# Patient Record
Sex: Male | Born: 1935 | Race: Black or African American | Hispanic: No | Marital: Married | Smoking: Former smoker
Health system: Southern US, Community
[De-identification: ages and names within clinical notes are randomized; demographics above are authoritative.]

## PROBLEM LIST (undated history)

## (undated) DIAGNOSIS — N200 Calculus of kidney: Secondary | ICD-10-CM

## (undated) DIAGNOSIS — C61 Malignant neoplasm of prostate: Secondary | ICD-10-CM

## (undated) DIAGNOSIS — E785 Hyperlipidemia, unspecified: Secondary | ICD-10-CM

## (undated) DIAGNOSIS — I10 Essential (primary) hypertension: Secondary | ICD-10-CM

## (undated) DIAGNOSIS — C679 Malignant neoplasm of bladder, unspecified: Secondary | ICD-10-CM

## (undated) DIAGNOSIS — I2510 Atherosclerotic heart disease of native coronary artery without angina pectoris: Secondary | ICD-10-CM

## (undated) HISTORY — PX: PROSTATE SURGERY: SHX751

## (undated) HISTORY — PX: CORONARY ARTERY BYPASS GRAFT: SHX141

## (undated) HISTORY — PX: PENILE PROSTHESIS IMPLANT: SHX240

## (undated) HISTORY — PX: CARDIAC SURGERY: SHX584

---

## 1998-07-12 ENCOUNTER — Emergency Department (HOSPITAL_COMMUNITY): Admission: EM | Admit: 1998-07-12 | Discharge: 1998-07-12 | Payer: Self-pay | Admitting: *Deleted

## 1998-10-31 ENCOUNTER — Emergency Department (HOSPITAL_COMMUNITY): Admission: EM | Admit: 1998-10-31 | Discharge: 1998-10-31 | Payer: Self-pay | Admitting: Emergency Medicine

## 1998-10-31 ENCOUNTER — Encounter: Payer: Self-pay | Admitting: Emergency Medicine

## 1998-11-21 ENCOUNTER — Emergency Department (HOSPITAL_COMMUNITY): Admission: EM | Admit: 1998-11-21 | Discharge: 1998-11-21 | Payer: Self-pay | Admitting: Emergency Medicine

## 1998-12-19 ENCOUNTER — Inpatient Hospital Stay (HOSPITAL_COMMUNITY): Admission: EM | Admit: 1998-12-19 | Discharge: 1998-12-25 | Payer: Self-pay | Admitting: Emergency Medicine

## 1998-12-19 ENCOUNTER — Encounter: Payer: Self-pay | Admitting: Cardiology

## 1999-01-17 ENCOUNTER — Encounter (HOSPITAL_COMMUNITY): Admission: RE | Admit: 1999-01-17 | Discharge: 1999-04-17 | Payer: Self-pay | Admitting: Cardiology

## 1999-03-22 ENCOUNTER — Encounter: Payer: Self-pay | Admitting: Emergency Medicine

## 1999-03-22 ENCOUNTER — Inpatient Hospital Stay (HOSPITAL_COMMUNITY): Admission: EM | Admit: 1999-03-22 | Discharge: 1999-03-30 | Payer: Self-pay | Admitting: Emergency Medicine

## 1999-03-23 ENCOUNTER — Encounter: Payer: Self-pay | Admitting: Cardiology

## 1999-03-24 ENCOUNTER — Encounter: Payer: Self-pay | Admitting: Thoracic Surgery (Cardiothoracic Vascular Surgery)

## 1999-03-24 ENCOUNTER — Encounter: Payer: Self-pay | Admitting: Cardiology

## 1999-03-25 ENCOUNTER — Encounter: Payer: Self-pay | Admitting: Cardiology

## 1999-03-26 ENCOUNTER — Encounter: Payer: Self-pay | Admitting: Thoracic Surgery (Cardiothoracic Vascular Surgery)

## 1999-03-27 ENCOUNTER — Encounter: Payer: Self-pay | Admitting: Thoracic Surgery (Cardiothoracic Vascular Surgery)

## 1999-06-03 ENCOUNTER — Inpatient Hospital Stay (HOSPITAL_COMMUNITY): Admission: EM | Admit: 1999-06-03 | Discharge: 1999-06-06 | Payer: Self-pay | Admitting: Emergency Medicine

## 1999-06-03 ENCOUNTER — Encounter: Payer: Self-pay | Admitting: Emergency Medicine

## 1999-06-04 ENCOUNTER — Encounter: Payer: Self-pay | Admitting: Cardiology

## 1999-06-05 ENCOUNTER — Encounter: Payer: Self-pay | Admitting: Cardiovascular Disease

## 1999-06-06 ENCOUNTER — Encounter: Payer: Self-pay | Admitting: Cardiology

## 1999-07-20 ENCOUNTER — Inpatient Hospital Stay (HOSPITAL_COMMUNITY): Admission: EM | Admit: 1999-07-20 | Discharge: 1999-07-22 | Payer: Self-pay | Admitting: Emergency Medicine

## 1999-07-20 ENCOUNTER — Encounter: Payer: Self-pay | Admitting: Emergency Medicine

## 1999-08-09 ENCOUNTER — Emergency Department (HOSPITAL_COMMUNITY): Admission: EM | Admit: 1999-08-09 | Discharge: 1999-08-09 | Payer: Self-pay | Admitting: Emergency Medicine

## 1999-11-30 ENCOUNTER — Encounter: Payer: Self-pay | Admitting: Emergency Medicine

## 1999-11-30 ENCOUNTER — Inpatient Hospital Stay (HOSPITAL_COMMUNITY): Admission: EM | Admit: 1999-11-30 | Discharge: 1999-12-01 | Payer: Self-pay | Admitting: Emergency Medicine

## 2000-07-28 ENCOUNTER — Emergency Department (HOSPITAL_COMMUNITY): Admission: EM | Admit: 2000-07-28 | Discharge: 2000-07-29 | Payer: Self-pay | Admitting: Emergency Medicine

## 2000-07-28 ENCOUNTER — Encounter: Payer: Self-pay | Admitting: Emergency Medicine

## 2000-10-19 ENCOUNTER — Inpatient Hospital Stay (HOSPITAL_COMMUNITY): Admission: EM | Admit: 2000-10-19 | Discharge: 2000-10-20 | Payer: Self-pay | Admitting: Emergency Medicine

## 2000-10-19 ENCOUNTER — Encounter: Payer: Self-pay | Admitting: Emergency Medicine

## 2000-10-20 ENCOUNTER — Encounter: Payer: Self-pay | Admitting: Cardiology

## 2001-01-09 ENCOUNTER — Ambulatory Visit (HOSPITAL_COMMUNITY): Admission: RE | Admit: 2001-01-09 | Discharge: 2001-01-09 | Payer: Self-pay | Admitting: Cardiology

## 2001-01-22 ENCOUNTER — Ambulatory Visit (HOSPITAL_COMMUNITY): Admission: RE | Admit: 2001-01-22 | Discharge: 2001-01-22 | Payer: Self-pay | Admitting: Cardiology

## 2001-01-22 ENCOUNTER — Encounter: Payer: Self-pay | Admitting: Cardiology

## 2001-04-25 ENCOUNTER — Emergency Department (HOSPITAL_COMMUNITY): Admission: EM | Admit: 2001-04-25 | Discharge: 2001-04-26 | Payer: Self-pay | Admitting: Emergency Medicine

## 2001-04-28 ENCOUNTER — Encounter: Admission: RE | Admit: 2001-04-28 | Discharge: 2001-04-28 | Payer: Self-pay | Admitting: Urology

## 2001-04-28 ENCOUNTER — Encounter: Payer: Self-pay | Admitting: Urology

## 2001-04-29 ENCOUNTER — Ambulatory Visit (HOSPITAL_BASED_OUTPATIENT_CLINIC_OR_DEPARTMENT_OTHER): Admission: RE | Admit: 2001-04-29 | Discharge: 2001-04-29 | Payer: Self-pay | Admitting: Urology

## 2001-04-29 ENCOUNTER — Encounter: Payer: Self-pay | Admitting: Urology

## 2001-05-01 ENCOUNTER — Ambulatory Visit (HOSPITAL_COMMUNITY): Admission: RE | Admit: 2001-05-01 | Discharge: 2001-05-01 | Payer: Self-pay | Admitting: Urology

## 2001-05-01 ENCOUNTER — Encounter: Payer: Self-pay | Admitting: Urology

## 2001-05-17 ENCOUNTER — Encounter: Payer: Self-pay | Admitting: Emergency Medicine

## 2001-05-17 ENCOUNTER — Emergency Department (HOSPITAL_COMMUNITY): Admission: EM | Admit: 2001-05-17 | Discharge: 2001-05-18 | Payer: Self-pay | Admitting: Emergency Medicine

## 2001-05-22 ENCOUNTER — Encounter: Payer: Self-pay | Admitting: Urology

## 2001-05-22 ENCOUNTER — Ambulatory Visit (HOSPITAL_COMMUNITY): Admission: RE | Admit: 2001-05-22 | Discharge: 2001-05-22 | Payer: Self-pay | Admitting: Urology

## 2001-07-21 ENCOUNTER — Inpatient Hospital Stay (HOSPITAL_COMMUNITY): Admission: EM | Admit: 2001-07-21 | Discharge: 2001-07-23 | Payer: Self-pay | Admitting: Emergency Medicine

## 2001-07-21 ENCOUNTER — Encounter: Payer: Self-pay | Admitting: Emergency Medicine

## 2001-07-22 ENCOUNTER — Encounter: Payer: Self-pay | Admitting: Cardiology

## 2001-09-14 ENCOUNTER — Emergency Department (HOSPITAL_COMMUNITY): Admission: EM | Admit: 2001-09-14 | Discharge: 2001-09-14 | Payer: Self-pay | Admitting: Emergency Medicine

## 2001-09-14 ENCOUNTER — Encounter: Payer: Self-pay | Admitting: Emergency Medicine

## 2001-09-23 ENCOUNTER — Encounter: Payer: Self-pay | Admitting: Cardiology

## 2001-09-23 ENCOUNTER — Encounter: Admission: RE | Admit: 2001-09-23 | Discharge: 2001-09-23 | Payer: Self-pay | Admitting: Cardiology

## 2002-02-12 ENCOUNTER — Encounter: Payer: Self-pay | Admitting: Urology

## 2002-02-12 ENCOUNTER — Ambulatory Visit (HOSPITAL_BASED_OUTPATIENT_CLINIC_OR_DEPARTMENT_OTHER): Admission: RE | Admit: 2002-02-12 | Discharge: 2002-02-12 | Payer: Self-pay | Admitting: Urology

## 2002-05-28 ENCOUNTER — Encounter: Payer: Self-pay | Admitting: Cardiology

## 2002-05-28 ENCOUNTER — Ambulatory Visit (HOSPITAL_COMMUNITY): Admission: RE | Admit: 2002-05-28 | Discharge: 2002-05-28 | Payer: Self-pay | Admitting: Cardiology

## 2002-09-01 ENCOUNTER — Encounter: Payer: Self-pay | Admitting: *Deleted

## 2002-09-01 ENCOUNTER — Emergency Department (HOSPITAL_COMMUNITY): Admission: EM | Admit: 2002-09-01 | Discharge: 2002-09-02 | Payer: Self-pay | Admitting: *Deleted

## 2002-11-09 ENCOUNTER — Ambulatory Visit (HOSPITAL_COMMUNITY): Admission: RE | Admit: 2002-11-09 | Discharge: 2002-11-09 | Payer: Self-pay | Admitting: Gastroenterology

## 2002-11-09 ENCOUNTER — Encounter (INDEPENDENT_AMBULATORY_CARE_PROVIDER_SITE_OTHER): Payer: Self-pay

## 2003-01-13 ENCOUNTER — Ambulatory Visit (HOSPITAL_BASED_OUTPATIENT_CLINIC_OR_DEPARTMENT_OTHER): Admission: RE | Admit: 2003-01-13 | Discharge: 2003-01-13 | Payer: Self-pay | Admitting: Urology

## 2003-01-13 ENCOUNTER — Encounter: Payer: Self-pay | Admitting: Urology

## 2003-01-25 ENCOUNTER — Ambulatory Visit: Admission: RE | Admit: 2003-01-25 | Discharge: 2003-04-25 | Payer: Self-pay | Admitting: Radiation Oncology

## 2003-02-15 ENCOUNTER — Encounter: Payer: Self-pay | Admitting: Cardiology

## 2003-02-15 ENCOUNTER — Ambulatory Visit (HOSPITAL_COMMUNITY): Admission: RE | Admit: 2003-02-15 | Discharge: 2003-02-15 | Payer: Self-pay | Admitting: Cardiology

## 2003-03-11 ENCOUNTER — Ambulatory Visit (HOSPITAL_BASED_OUTPATIENT_CLINIC_OR_DEPARTMENT_OTHER): Admission: RE | Admit: 2003-03-11 | Discharge: 2003-03-11 | Payer: Self-pay | Admitting: Urology

## 2003-03-11 ENCOUNTER — Encounter: Payer: Self-pay | Admitting: Urology

## 2003-04-26 ENCOUNTER — Ambulatory Visit: Admission: RE | Admit: 2003-04-26 | Discharge: 2003-07-25 | Payer: Self-pay | Admitting: Radiation Oncology

## 2003-06-25 ENCOUNTER — Emergency Department (HOSPITAL_COMMUNITY): Admission: EM | Admit: 2003-06-25 | Discharge: 2003-06-25 | Payer: Self-pay | Admitting: Emergency Medicine

## 2003-06-25 ENCOUNTER — Encounter: Payer: Self-pay | Admitting: *Deleted

## 2003-06-27 ENCOUNTER — Encounter: Payer: Self-pay | Admitting: *Deleted

## 2003-06-27 ENCOUNTER — Inpatient Hospital Stay (HOSPITAL_COMMUNITY): Admission: EM | Admit: 2003-06-27 | Discharge: 2003-06-30 | Payer: Self-pay | Admitting: *Deleted

## 2003-06-30 ENCOUNTER — Inpatient Hospital Stay (HOSPITAL_COMMUNITY)
Admission: RE | Admit: 2003-06-30 | Discharge: 2003-07-06 | Payer: Self-pay | Admitting: Physical Medicine & Rehabilitation

## 2003-07-05 ENCOUNTER — Encounter: Payer: Self-pay | Admitting: Physical Medicine & Rehabilitation

## 2003-08-07 ENCOUNTER — Emergency Department (HOSPITAL_COMMUNITY): Admission: EM | Admit: 2003-08-07 | Discharge: 2003-08-07 | Payer: Self-pay | Admitting: Emergency Medicine

## 2003-12-06 ENCOUNTER — Ambulatory Visit: Admission: RE | Admit: 2003-12-06 | Discharge: 2003-12-06 | Payer: Self-pay | Admitting: Radiation Oncology

## 2003-12-20 ENCOUNTER — Ambulatory Visit: Admission: RE | Admit: 2003-12-20 | Discharge: 2003-12-20 | Payer: Self-pay | Admitting: Radiation Oncology

## 2004-02-23 ENCOUNTER — Emergency Department (HOSPITAL_COMMUNITY): Admission: EM | Admit: 2004-02-23 | Discharge: 2004-02-23 | Payer: Self-pay | Admitting: Emergency Medicine

## 2004-07-12 ENCOUNTER — Inpatient Hospital Stay (HOSPITAL_COMMUNITY): Admission: EM | Admit: 2004-07-12 | Discharge: 2004-08-11 | Payer: Self-pay

## 2004-07-13 ENCOUNTER — Encounter (INDEPENDENT_AMBULATORY_CARE_PROVIDER_SITE_OTHER): Payer: Self-pay | Admitting: *Deleted

## 2004-07-13 ENCOUNTER — Encounter (INDEPENDENT_AMBULATORY_CARE_PROVIDER_SITE_OTHER): Payer: Self-pay | Admitting: Cardiology

## 2004-07-17 ENCOUNTER — Encounter (INDEPENDENT_AMBULATORY_CARE_PROVIDER_SITE_OTHER): Payer: Self-pay | Admitting: *Deleted

## 2004-09-01 ENCOUNTER — Ambulatory Visit (HOSPITAL_COMMUNITY): Admission: RE | Admit: 2004-09-01 | Discharge: 2004-09-01 | Payer: Self-pay | Admitting: Cardiology

## 2004-10-12 ENCOUNTER — Ambulatory Visit (HOSPITAL_COMMUNITY): Admission: RE | Admit: 2004-10-12 | Discharge: 2004-10-13 | Payer: Self-pay | Admitting: General Surgery

## 2004-10-12 ENCOUNTER — Encounter (INDEPENDENT_AMBULATORY_CARE_PROVIDER_SITE_OTHER): Payer: Self-pay | Admitting: *Deleted

## 2005-03-01 ENCOUNTER — Ambulatory Visit: Payer: Self-pay | Admitting: Oncology

## 2005-06-08 ENCOUNTER — Encounter: Admission: RE | Admit: 2005-06-08 | Discharge: 2005-06-08 | Payer: Self-pay | Admitting: General Surgery

## 2005-06-25 ENCOUNTER — Ambulatory Visit (HOSPITAL_COMMUNITY): Admission: RE | Admit: 2005-06-25 | Discharge: 2005-06-25 | Payer: Self-pay | Admitting: General Surgery

## 2005-06-25 ENCOUNTER — Encounter (INDEPENDENT_AMBULATORY_CARE_PROVIDER_SITE_OTHER): Payer: Self-pay | Admitting: *Deleted

## 2005-07-10 ENCOUNTER — Emergency Department (HOSPITAL_COMMUNITY): Admission: EM | Admit: 2005-07-10 | Discharge: 2005-07-10 | Payer: Self-pay | Admitting: Family Medicine

## 2005-07-10 ENCOUNTER — Ambulatory Visit (HOSPITAL_COMMUNITY): Admission: RE | Admit: 2005-07-10 | Discharge: 2005-07-10 | Payer: Self-pay | Admitting: General Surgery

## 2005-10-23 ENCOUNTER — Ambulatory Visit (HOSPITAL_COMMUNITY): Admission: RE | Admit: 2005-10-23 | Discharge: 2005-10-23 | Payer: Self-pay | Admitting: General Surgery

## 2005-11-16 ENCOUNTER — Ambulatory Visit: Payer: Self-pay | Admitting: Oncology

## 2006-02-26 ENCOUNTER — Ambulatory Visit: Payer: Self-pay | Admitting: Oncology

## 2006-02-27 ENCOUNTER — Ambulatory Visit (HOSPITAL_COMMUNITY): Admission: RE | Admit: 2006-02-27 | Discharge: 2006-02-27 | Payer: Self-pay | Admitting: Oncology

## 2006-02-28 ENCOUNTER — Ambulatory Visit (HOSPITAL_COMMUNITY): Admission: RE | Admit: 2006-02-28 | Discharge: 2006-02-28 | Payer: Self-pay | Admitting: Oncology

## 2006-05-28 ENCOUNTER — Emergency Department (HOSPITAL_COMMUNITY): Admission: EM | Admit: 2006-05-28 | Discharge: 2006-05-29 | Payer: Self-pay | Admitting: Emergency Medicine

## 2006-05-29 ENCOUNTER — Inpatient Hospital Stay (HOSPITAL_COMMUNITY): Admission: EM | Admit: 2006-05-29 | Discharge: 2006-05-31 | Payer: Self-pay | Admitting: Emergency Medicine

## 2006-08-16 ENCOUNTER — Ambulatory Visit: Payer: Self-pay | Admitting: Oncology

## 2006-11-22 ENCOUNTER — Ambulatory Visit (HOSPITAL_COMMUNITY): Admission: AD | Admit: 2006-11-22 | Discharge: 2006-11-22 | Payer: Self-pay | Admitting: Urology

## 2006-12-06 ENCOUNTER — Emergency Department (HOSPITAL_COMMUNITY): Admission: EM | Admit: 2006-12-06 | Discharge: 2006-12-06 | Payer: Self-pay | Admitting: Emergency Medicine

## 2007-01-27 ENCOUNTER — Encounter (HOSPITAL_COMMUNITY): Admission: RE | Admit: 2007-01-27 | Discharge: 2007-01-28 | Payer: Self-pay | Admitting: Cardiology

## 2007-02-04 ENCOUNTER — Inpatient Hospital Stay (HOSPITAL_BASED_OUTPATIENT_CLINIC_OR_DEPARTMENT_OTHER): Admission: RE | Admit: 2007-02-04 | Discharge: 2007-02-04 | Payer: Self-pay | Admitting: Cardiology

## 2008-01-15 ENCOUNTER — Ambulatory Visit: Payer: Self-pay | Admitting: Family Medicine

## 2008-01-15 ENCOUNTER — Observation Stay (HOSPITAL_COMMUNITY): Admission: EM | Admit: 2008-01-15 | Discharge: 2008-01-17 | Payer: Self-pay | Admitting: Emergency Medicine

## 2008-05-04 ENCOUNTER — Inpatient Hospital Stay (HOSPITAL_COMMUNITY): Admission: EM | Admit: 2008-05-04 | Discharge: 2008-05-05 | Payer: Self-pay | Admitting: Emergency Medicine

## 2008-06-26 ENCOUNTER — Emergency Department (HOSPITAL_COMMUNITY): Admission: EM | Admit: 2008-06-26 | Discharge: 2008-06-26 | Payer: Self-pay | Admitting: Emergency Medicine

## 2008-09-20 ENCOUNTER — Emergency Department (HOSPITAL_COMMUNITY): Admission: EM | Admit: 2008-09-20 | Discharge: 2008-09-20 | Payer: Self-pay | Admitting: Emergency Medicine

## 2008-10-25 ENCOUNTER — Ambulatory Visit (HOSPITAL_COMMUNITY): Admission: RE | Admit: 2008-10-25 | Discharge: 2008-10-25 | Payer: Self-pay | Admitting: Cardiology

## 2008-10-26 ENCOUNTER — Ambulatory Visit (HOSPITAL_COMMUNITY): Admission: RE | Admit: 2008-10-26 | Discharge: 2008-10-26 | Payer: Self-pay | Admitting: Cardiology

## 2009-01-28 ENCOUNTER — Emergency Department (HOSPITAL_COMMUNITY): Admission: EM | Admit: 2009-01-28 | Discharge: 2009-01-28 | Payer: Self-pay | Admitting: Emergency Medicine

## 2009-02-01 ENCOUNTER — Emergency Department (HOSPITAL_COMMUNITY): Admission: EM | Admit: 2009-02-01 | Discharge: 2009-02-01 | Payer: Self-pay | Admitting: Emergency Medicine

## 2010-10-14 ENCOUNTER — Encounter
Admission: RE | Admit: 2010-10-14 | Discharge: 2010-10-14 | Payer: Self-pay | Admitting: Physical Medicine and Rehabilitation

## 2010-11-14 ENCOUNTER — Ambulatory Visit (HOSPITAL_COMMUNITY)
Admission: RE | Admit: 2010-11-14 | Discharge: 2010-11-14 | Payer: Self-pay | Source: Home / Self Care | Attending: Urology | Admitting: Urology

## 2010-12-25 ENCOUNTER — Inpatient Hospital Stay (HOSPITAL_COMMUNITY)
Admission: EM | Admit: 2010-12-25 | Discharge: 2011-01-02 | Payer: Self-pay | Source: Home / Self Care | Attending: Cardiology | Admitting: Cardiology

## 2010-12-27 LAB — COMPREHENSIVE METABOLIC PANEL
ALT: 11 U/L (ref 0–53)
AST: 22 U/L (ref 0–37)
Albumin: 3.3 g/dL — ABNORMAL LOW (ref 3.5–5.2)
Alkaline Phosphatase: 69 U/L (ref 39–117)
BUN: 9 mg/dL (ref 6–23)
CO2: 22 mEq/L (ref 19–32)
Calcium: 9.2 mg/dL (ref 8.4–10.5)
Chloride: 105 mEq/L (ref 96–112)
Creatinine, Ser: 1.06 mg/dL (ref 0.4–1.5)
GFR calc Af Amer: >60 mL/min (ref >60)
GFR calc non Af Amer: >60 mL/min (ref >60)
Glucose, Bld: 182 mg/dL — ABNORMAL HIGH (ref 70–99)
Potassium: 3.7 mEq/L (ref 3.5–5.1)
Sodium: 139 mEq/L (ref 135–145)
Total Bilirubin: 0.6 mg/dL (ref 0.3–1.2)
Total Protein: 6.8 g/dL (ref 6.0–8.3)

## 2010-12-27 LAB — CBC
HCT: 41.7 % (ref 39.0–52.0)
Hemoglobin: 14 g/dL (ref 13.0–17.0)
MCH: 29.2 pg (ref 26.0–34.0)
MCHC: 33.6 g/dL (ref 30.0–36.0)
MCV: 86.9 fL (ref 78.0–100.0)
Platelets: 165 10*3/uL (ref 150–400)
RBC: 4.8 MIL/uL (ref 4.22–5.81)
RDW: 14.6 % (ref 11.5–15.5)
WBC: 8 10*3/uL (ref 4.0–10.5)

## 2010-12-27 LAB — DIFFERENTIAL
Basophils Absolute: 0 10*3/uL (ref 0.0–0.1)
Basophils Relative: 0 % (ref 0–1)
Eosinophils Absolute: 0.2 10*3/uL (ref 0.0–0.7)
Eosinophils Relative: 3 % (ref 0–5)
Lymphocytes Relative: 18 % (ref 12–46)
Lymphs Abs: 1.4 10*3/uL (ref 0.7–4.0)
Monocytes Absolute: 0.8 10*3/uL (ref 0.1–1.0)
Monocytes Relative: 10 % (ref 3–12)
Neutro Abs: 5.5 10*3/uL (ref 1.7–7.7)
Neutrophils Relative %: 69 % (ref 43–77)

## 2010-12-27 LAB — APTT: aPTT: 31 seconds (ref 24–37)

## 2010-12-27 LAB — PROCALCITONIN: Procalcitonin: <0.1 ng/mL

## 2010-12-27 LAB — POCT CARDIAC MARKERS
CKMB, poc: <1 ng/mL — ABNORMAL LOW (ref 1.0–8.0)
Myoglobin, poc: 121 ng/mL (ref 12–200)
Troponin i, poc: <0.05 ng/mL (ref 0.00–0.09)

## 2010-12-27 LAB — BASIC METABOLIC PANEL
BUN: 9 mg/dL (ref 6–23)
CO2: 23 mEq/L (ref 19–32)
Calcium: 9.3 mg/dL (ref 8.4–10.5)
Chloride: 104 mEq/L (ref 96–112)
Creatinine, Ser: 0.98 mg/dL (ref 0.4–1.5)
GFR calc Af Amer: >60 mL/min (ref >60)
GFR calc non Af Amer: >60 mL/min (ref >60)
Glucose, Bld: 113 mg/dL — ABNORMAL HIGH (ref 70–99)
Potassium: 4.1 mEq/L (ref 3.5–5.1)
Sodium: 137 mEq/L (ref 135–145)

## 2010-12-27 LAB — PROTIME-INR
INR: 1.21 (ref 0.00–1.49)
Prothrombin Time: 15.5 seconds — ABNORMAL HIGH (ref 11.6–15.2)

## 2010-12-31 ENCOUNTER — Encounter: Payer: Self-pay | Admitting: Oncology

## 2011-01-01 LAB — CBC
HCT: 38.3 % — ABNORMAL LOW (ref 39.0–52.0)
Hemoglobin: 12.8 g/dL — ABNORMAL LOW (ref 13.0–17.0)
MCH: 29 pg (ref 26.0–34.0)
MCHC: 33.4 g/dL (ref 30.0–36.0)
RBC: 4.42 MIL/uL (ref 4.22–5.81)

## 2011-01-01 LAB — BASIC METABOLIC PANEL
CO2: 25 mEq/L (ref 19–32)
GFR calc non Af Amer: >60 mL/min (ref >60)
Glucose, Bld: 104 mg/dL — ABNORMAL HIGH (ref 70–99)

## 2011-01-02 LAB — BASIC METABOLIC PANEL
Calcium: 9.1 mg/dL (ref 8.4–10.5)
Chloride: 103 mEq/L (ref 96–112)
GFR calc non Af Amer: >60 mL/min (ref >60)
Glucose, Bld: 103 mg/dL — ABNORMAL HIGH (ref 70–99)
Potassium: 4.1 mEq/L (ref 3.5–5.1)

## 2011-01-02 LAB — CBC
Hemoglobin: 12.5 g/dL — ABNORMAL LOW (ref 13.0–17.0)
MCH: 28.6 pg (ref 26.0–34.0)
MCV: 87 fL (ref 78.0–100.0)

## 2011-01-10 ENCOUNTER — Ambulatory Visit
Admission: RE | Admit: 2011-01-10 | Discharge: 2011-01-10 | Disposition: A | Discharge status: Home / Self Care | Payer: Medicare Other | Source: Ambulatory Visit | Attending: Cardiology | Admitting: Cardiology

## 2011-01-10 ENCOUNTER — Other Ambulatory Visit: Payer: Self-pay | Admitting: Cardiology

## 2011-01-10 DIAGNOSIS — J189 Pneumonia, unspecified organism: Secondary | ICD-10-CM

## 2011-01-24 NOTE — Discharge Summary (Signed)
NAME:  [name], [name]              ACCOUNT NO.:  [id number]  MEDICAL RECORD NO.:  [id number]          PATIENT TYPE:  INP  LOCATION:  4742                         FACILITY:  [facility]  PHYSICIAN:  Jaylin Roundy N. Sharyn Lull, [name] DATE OF BIRTH:  03-27-1931  DATE OF ADMISSION:  12/25/2010 DATE OF DISCHARGE:  01/02/2011                              DISCHARGE SUMMARY   ADMITTING DIAGNOSES: 1. Right lung pneumonia, rule out cancer. 2. Coronary artery disease, status post coronary artery bypass graft. 3. History of myocardial infarction in the past. 4. Hypertension. 5. Hypercholesteremia. 6. Morbid obesity. 7. Glucose intolerance. 8. History of parathyroid adenoma resection in the past.  DISCHARGE DIAGNOSES: 1. Resolving bilateral pneumonia. 2. Coronary artery disease, status post coronary artery bypass graft. 3. Hypertension. 4. Hypercholesteremia. 5. Glucose intolerance. 6. Morbid obesity. 7. History of questionable congestive heart failure secondary to     diastolic dysfunction in the past.  DISCHARGE MEDICATIONS: 1. Enteric-coated aspirin 81 mg one tablet daily. 2. Plavix 75 mg one tablet daily. 3. Lisinopril 20 mg one tablet twice daily. 4. Toprol-XL 50 mg one tablet daily. 5. Crestor 10 mg one tablet daily. 6. Avelox 400 mg one tablet daily for 7 days.  DIET:  Low salt, low cholesterol.  The patient has been advised to avoid sweets.  ACTIVITY:  Increase activity as tolerated.  Follow up with me in 1 week.  CONDITION AT DISCHARGE:  Stable.  We will repeat the chest x-ray in 1-2 weeks as outpatient for follow up of infiltrates.  BRIEF HISTORY AND HOSPITAL COURSE:  [name] is a 77 year old black male with past medical history significant for coronary artery disease, history of anteroseptal wall MI in January 2000, status post PCI to LAD and then subsequently had CABG in April 2004, history of CHF probably secondary to diastolic dysfunction, hypertension,  hypercholesteremia, history of parathyroid adenoma resection in the past.  He came to the ER complaining of cough with chest congestion and yellowish phlegm associated with chills and hoarse voice for last 4 days.  Denies any anginal chest pain.  Denies hemoptysis.  Denies PND, orthopnea, or leg swelling.  Denies rigors.  Denies muscle aches.  Denies abdominal pain, nausea, vomiting, diarrhea.  Denies weakness in arms or legs.  Chest x- ray done in the ER showed vague densities in the right lung, possible pneumonia versus neoplasm.  The patient used to smoke 1 pack per week for 20+ years ago, quit 50+ years ago.  PAST MEDICAL HISTORY:  As above.  PAST SURGICAL HISTORY:  He had CABG in April 2004.  He had LIMA to LAD, RIMA to RCA, and saphenous vein graft to diagonal 1, status post parathyroidectomy in 2005, history of ankle fracture in the past, had penile implant in 1994.  SOCIAL HISTORY:  He is married, retired, worked as Naval architect.  Smoked one-pack per week for 20+ years, quit 50+ years ago.  Drinks beer occasionally and socially.  FAMILY HISTORY:  Positive for coronary artery disease.  MEDICATION AT HOME:  He was on; 1. Enteric-coated aspirin 81 mg p.o. daily. 2. Plavix 75 mg p.o. daily. 3. Toprol-XL mg p.o. daily.  4. Lisinopril 40 mg p.o. b.i.d. 5. Lipitor 20 mg p.o. daily. 6. Nitrostat sublingual p.r.n.  ALLERGIES:  No known drug allergies.  PHYSICAL EXAMINATION:  GENERAL:  He was alert and oriented x3, in no acute distress. VITAL SIGNS:  Blood pressure was 135/89, pulse was 111, sinus tach on the monitor. HEENT:  Conjunctivae was pink. NECK:  Supple, no JVD, no bruit. LUNGS:  He has right occasional rhonchi and left lung was clear. CARDIOVASCULAR:  S1-S2 was normal.  There was soft systolic murmur. ABDOMEN:  Soft.  Bowel sounds were present, nontender, obese. EXTREMITIES:  There was no clubbing, cyanosis, or edema.  LABORATORY DATA:  Hemoglobin was 14,  hematocrit 41.7, white count of 8.0.  Sodium was 137, potassium 4.1, BUN 9, creatinine 0.98, glucose 113, procalcitonin level was less than 0.10.  Repeat hemoglobin was 12.5, hematocrit 38, white count of 9.  Sodium is 138, potassium 4.1, BUN 12, creatinine 1.08, glucose was 103.  His admission chest x-ray showed vague densities in the right mid and right lower lung zones possible pneumonia, but cannot exclude neoplasm.  CT of the chest with contrast showed patchy airspace opacities bilaterally most consistent with pneumonia, noninfectious inflammation and atypical neoplasm.  I considered less likely no well-defined mass or adenopathy was noted, diffuse etiopathic skeletal hyperostosis.  Repeat chest x-ray done on December 29, 2010 showed stable patchy pneumonia involving the right lung, stable mid cardiomegaly without pulmonary edema, stable scarring in the lingula, no other abnormalities.  Repeat chest x-ray done today showed patchy parenchymal densities bilaterally.  Findings are similar to the prior exam could represent atypical pneumonia versus chronic changes.  There was no confluent airspace opacification, postoperative changes were noted.  BRIEF HOSPITAL COURSE:  The patient was admitted to telemetry unit and was started on IV Rocephin and Zithromax with resolution of his cough and improvement in his breathing.  The patient remained afebrile during the hospital stay.  The patient has received more than 7 days of antibiotics.  His coughing is completely resolved.  No further fever or chills, although x-ray showed persistent patchy opacities, possible chronic changes.  The patient has been started on Avelox p.o.  The patient will be started on Avelox p.o. for 1 week and we will repeat the chest x-ray as outpatient in 1-2 weeks.     Eduardo Osier. Sharyn Lull, [name]     MNH/MEDQ  D:  01/02/2011  T:  01/03/2011  Job:  [id number]  Electronically Signed by Rinaldo Cloud [name] on 01/24/2011  10:43:56 PM

## 2011-01-29 ENCOUNTER — Emergency Department (HOSPITAL_COMMUNITY): Payer: Medicare Other

## 2011-01-29 ENCOUNTER — Emergency Department (HOSPITAL_COMMUNITY)
Admission: EM | Admit: 2011-01-29 | Discharge: 2011-01-29 | Disposition: A | Discharge status: Home / Self Care | Payer: Medicare Other | Attending: Emergency Medicine | Admitting: Emergency Medicine

## 2011-01-29 DIAGNOSIS — I2510 Atherosclerotic heart disease of native coronary artery without angina pectoris: Secondary | ICD-10-CM | POA: Insufficient documentation

## 2011-01-29 DIAGNOSIS — I10 Essential (primary) hypertension: Secondary | ICD-10-CM | POA: Insufficient documentation

## 2011-01-29 DIAGNOSIS — Z951 Presence of aortocoronary bypass graft: Secondary | ICD-10-CM | POA: Insufficient documentation

## 2011-01-29 DIAGNOSIS — R109 Unspecified abdominal pain: Secondary | ICD-10-CM | POA: Insufficient documentation

## 2011-01-29 DIAGNOSIS — Z79899 Other long term (current) drug therapy: Secondary | ICD-10-CM | POA: Insufficient documentation

## 2011-01-29 DIAGNOSIS — Z7982 Long term (current) use of aspirin: Secondary | ICD-10-CM | POA: Insufficient documentation

## 2011-01-29 DIAGNOSIS — Z8546 Personal history of malignant neoplasm of prostate: Secondary | ICD-10-CM | POA: Insufficient documentation

## 2011-01-29 DIAGNOSIS — E7800 Pure hypercholesterolemia, unspecified: Secondary | ICD-10-CM | POA: Insufficient documentation

## 2011-01-29 LAB — DIFFERENTIAL
Basophils Absolute: 0.1 10*3/uL (ref 0.0–0.1)
Basophils Relative: 1 % (ref 0–1)
Eosinophils Absolute: 0.5 10*3/uL (ref 0.0–0.7)
Eosinophils Relative: 6 % — ABNORMAL HIGH (ref 0–5)
Lymphocytes Relative: 23 % (ref 12–46)
Lymphs Abs: 1.7 10*3/uL (ref 0.7–4.0)
Monocytes Absolute: 0.8 10*3/uL (ref 0.1–1.0)
Monocytes Relative: 10 % (ref 3–12)
Neutro Abs: 4.5 10*3/uL (ref 1.7–7.7)
Neutrophils Relative %: 60 % (ref 43–77)

## 2011-01-29 LAB — COMPREHENSIVE METABOLIC PANEL
ALT: 13 U/L (ref 0–53)
AST: 19 U/L (ref 0–37)
Albumin: 3.8 g/dL (ref 3.5–5.2)
Alkaline Phosphatase: 76 U/L (ref 39–117)
BUN: 14 mg/dL (ref 6–23)
CO2: 24 mEq/L (ref 19–32)
Calcium: 9.2 mg/dL (ref 8.4–10.5)
Chloride: 109 mEq/L (ref 96–112)
Creatinine, Ser: 1.04 mg/dL (ref 0.4–1.5)
GFR calc Af Amer: >60 mL/min (ref >60)
GFR calc non Af Amer: >60 mL/min (ref >60)
Glucose, Bld: 88 mg/dL (ref 70–99)
Potassium: 3.9 mEq/L (ref 3.5–5.1)
Sodium: 141 mEq/L (ref 135–145)
Total Bilirubin: 0.5 mg/dL (ref 0.3–1.2)
Total Protein: 7.1 g/dL (ref 6.0–8.3)

## 2011-01-29 LAB — URINE MICROSCOPIC-ADD ON

## 2011-01-29 LAB — URINALYSIS, ROUTINE W REFLEX MICROSCOPIC
Bilirubin Urine: NEGATIVE
Hgb urine dipstick: NEGATIVE
Ketones, ur: NEGATIVE mg/dL
Nitrite: NEGATIVE

## 2011-01-29 LAB — CBC
HCT: 41.7 % (ref 39.0–52.0)
Hemoglobin: 14 g/dL (ref 13.0–17.0)
MCH: 29.4 pg (ref 26.0–34.0)
MCHC: 33.6 g/dL (ref 30.0–36.0)
MCV: 87.6 fL (ref 78.0–100.0)
Platelets: 183 10*3/uL (ref 150–400)
RBC: 4.76 MIL/uL (ref 4.22–5.81)
RDW: 14.8 % (ref 11.5–15.5)
WBC: 7.6 10*3/uL (ref 4.0–10.5)

## 2011-02-19 ENCOUNTER — Emergency Department (HOSPITAL_COMMUNITY)
Admission: EM | Admit: 2011-02-19 | Discharge: 2011-02-20 | Disposition: A | Discharge status: Home / Self Care | Payer: Medicare Other | Attending: Emergency Medicine | Admitting: Emergency Medicine

## 2011-02-19 DIAGNOSIS — I10 Essential (primary) hypertension: Secondary | ICD-10-CM | POA: Insufficient documentation

## 2011-02-19 DIAGNOSIS — I2510 Atherosclerotic heart disease of native coronary artery without angina pectoris: Secondary | ICD-10-CM | POA: Insufficient documentation

## 2011-02-19 DIAGNOSIS — R5383 Other fatigue: Secondary | ICD-10-CM | POA: Insufficient documentation

## 2011-02-19 DIAGNOSIS — E7800 Pure hypercholesterolemia, unspecified: Secondary | ICD-10-CM | POA: Insufficient documentation

## 2011-02-19 DIAGNOSIS — R110 Nausea: Secondary | ICD-10-CM | POA: Insufficient documentation

## 2011-02-19 DIAGNOSIS — I4510 Unspecified right bundle-branch block: Secondary | ICD-10-CM | POA: Insufficient documentation

## 2011-02-19 DIAGNOSIS — R0602 Shortness of breath: Secondary | ICD-10-CM | POA: Insufficient documentation

## 2011-02-19 DIAGNOSIS — R5381 Other malaise: Secondary | ICD-10-CM | POA: Insufficient documentation

## 2011-02-19 DIAGNOSIS — R0609 Other forms of dyspnea: Secondary | ICD-10-CM | POA: Insufficient documentation

## 2011-02-19 DIAGNOSIS — M25519 Pain in unspecified shoulder: Secondary | ICD-10-CM | POA: Insufficient documentation

## 2011-02-19 DIAGNOSIS — R0989 Other specified symptoms and signs involving the circulatory and respiratory systems: Secondary | ICD-10-CM | POA: Insufficient documentation

## 2011-02-19 DIAGNOSIS — M542 Cervicalgia: Secondary | ICD-10-CM | POA: Insufficient documentation

## 2011-02-20 ENCOUNTER — Other Ambulatory Visit (HOSPITAL_COMMUNITY): Payer: Self-pay | Admitting: Cardiology

## 2011-02-20 ENCOUNTER — Emergency Department (HOSPITAL_COMMUNITY): Payer: Medicare Other

## 2011-02-20 LAB — CBC
HCT: 38.4 % — ABNORMAL LOW (ref 39.0–52.0)
Hemoglobin: 12.9 g/dL — ABNORMAL LOW (ref 13.0–17.0)
MCH: 29.3 pg (ref 26.0–34.0)
MCHC: 33.6 g/dL (ref 30.0–36.0)
MCV: 87.1 fL (ref 78.0–100.0)

## 2011-02-20 LAB — DIFFERENTIAL
Eosinophils Relative: 7 % — ABNORMAL HIGH (ref 0–5)
Lymphocytes Relative: 29 % (ref 12–46)
Monocytes Absolute: 0.7 10*3/uL (ref 0.1–1.0)
Monocytes Relative: 9 % (ref 3–12)
Neutro Abs: 4.1 10*3/uL (ref 1.7–7.7)

## 2011-02-20 LAB — BRAIN NATRIURETIC PEPTIDE: Pro B Natriuretic peptide (BNP): 67 pg/mL (ref 0.0–100.0)

## 2011-02-20 LAB — URINALYSIS, ROUTINE W REFLEX MICROSCOPIC
Bilirubin Urine: NEGATIVE
Glucose, UA: NEGATIVE mg/dL
Hgb urine dipstick: NEGATIVE
Ketones, ur: NEGATIVE mg/dL
pH: 6.5 (ref 5.0–8.0)

## 2011-02-20 LAB — BASIC METABOLIC PANEL
BUN: 14 mg/dL (ref 6–23)
CO2: 26 mEq/L (ref 19–32)
Chloride: 108 mEq/L (ref 96–112)
Glucose, Bld: 101 mg/dL — ABNORMAL HIGH (ref 70–99)
Potassium: 3.9 mEq/L (ref 3.5–5.1)

## 2011-02-20 LAB — CK TOTAL AND CKMB (NOT AT ARMC): Total CK: 107 U/L (ref 7–232)

## 2011-02-20 LAB — TROPONIN I: Troponin I: 0.01 ng/mL (ref 0.00–0.06)

## 2011-03-12 ENCOUNTER — Ambulatory Visit (HOSPITAL_COMMUNITY): Admission: RE | Admit: 2011-03-12 | Payer: Medicare Other | Source: Ambulatory Visit

## 2011-03-12 ENCOUNTER — Ambulatory Visit (HOSPITAL_COMMUNITY): Payer: Medicare Other

## 2011-03-12 ENCOUNTER — Encounter (HOSPITAL_COMMUNITY)
Admission: RE | Admit: 2011-03-12 | Discharge: 2011-03-12 | Disposition: A | Discharge status: Home / Self Care | Payer: Medicare Other | Source: Ambulatory Visit | Attending: Cardiology | Admitting: Cardiology

## 2011-03-12 DIAGNOSIS — R0602 Shortness of breath: Secondary | ICD-10-CM | POA: Insufficient documentation

## 2011-03-12 DIAGNOSIS — I498 Other specified cardiac arrhythmias: Secondary | ICD-10-CM | POA: Insufficient documentation

## 2011-03-12 DIAGNOSIS — Z951 Presence of aortocoronary bypass graft: Secondary | ICD-10-CM | POA: Insufficient documentation

## 2011-03-12 DIAGNOSIS — E7800 Pure hypercholesterolemia, unspecified: Secondary | ICD-10-CM | POA: Insufficient documentation

## 2011-03-12 DIAGNOSIS — R9439 Abnormal result of other cardiovascular function study: Secondary | ICD-10-CM | POA: Insufficient documentation

## 2011-03-12 DIAGNOSIS — R079 Chest pain, unspecified: Secondary | ICD-10-CM | POA: Insufficient documentation

## 2011-03-12 DIAGNOSIS — I4510 Unspecified right bundle-branch block: Secondary | ICD-10-CM | POA: Insufficient documentation

## 2011-03-12 MED ORDER — TECHNETIUM TC 99M TETROFOSMIN IV KIT
30.0000 | PACK | Freq: Once | INTRAVENOUS | Status: AC | PRN
  Administered 2011-03-12: 30 via INTRAVENOUS

## 2011-03-12 MED ORDER — TECHNETIUM TC 99M TETROFOSMIN IV KIT
10.0000 | PACK | Freq: Once | INTRAVENOUS | Status: AC | PRN
  Administered 2011-03-12: 10 via INTRAVENOUS

## 2011-03-27 LAB — CBC
Hemoglobin: 13.1 g/dL (ref 13.0–17.0)
RDW: 14.9 % (ref 11.5–15.5)
WBC: 7.8 10*3/uL (ref 4.0–10.5)

## 2011-03-27 LAB — DIFFERENTIAL
Basophils Absolute: 0.1 10*3/uL (ref 0.0–0.1)
Lymphocytes Relative: 18 % (ref 12–46)
Monocytes Absolute: 0.6 10*3/uL (ref 0.1–1.0)
Neutro Abs: 5.5 10*3/uL (ref 1.7–7.7)

## 2011-03-27 LAB — POCT CARDIAC MARKERS
Myoglobin, poc: 123 ng/mL (ref 12–200)
Troponin i, poc: <0.05 ng/mL (ref 0.00–0.09)

## 2011-03-27 LAB — POCT I-STAT, CHEM 8
BUN: 11 mg/dL (ref 6–23)
Chloride: 107 mEq/L (ref 96–112)
Sodium: 141 mEq/L (ref 135–145)

## 2011-04-12 ENCOUNTER — Emergency Department (HOSPITAL_COMMUNITY)
Admission: EM | Admit: 2011-04-12 | Discharge: 2011-04-12 | Disposition: A | Discharge status: Home / Self Care | Payer: Medicare Other | Attending: Emergency Medicine | Admitting: Emergency Medicine

## 2011-04-12 ENCOUNTER — Encounter (HOSPITAL_COMMUNITY): Payer: Self-pay | Admitting: Radiology

## 2011-04-12 ENCOUNTER — Emergency Department (HOSPITAL_COMMUNITY): Payer: Medicare Other

## 2011-04-12 DIAGNOSIS — M545 Low back pain, unspecified: Secondary | ICD-10-CM | POA: Insufficient documentation

## 2011-04-12 DIAGNOSIS — Z7982 Long term (current) use of aspirin: Secondary | ICD-10-CM | POA: Insufficient documentation

## 2011-04-12 DIAGNOSIS — R350 Frequency of micturition: Secondary | ICD-10-CM | POA: Insufficient documentation

## 2011-04-12 DIAGNOSIS — I2510 Atherosclerotic heart disease of native coronary artery without angina pectoris: Secondary | ICD-10-CM | POA: Insufficient documentation

## 2011-04-12 DIAGNOSIS — R609 Edema, unspecified: Secondary | ICD-10-CM | POA: Insufficient documentation

## 2011-04-12 DIAGNOSIS — Z79899 Other long term (current) drug therapy: Secondary | ICD-10-CM | POA: Insufficient documentation

## 2011-04-12 DIAGNOSIS — Z8546 Personal history of malignant neoplasm of prostate: Secondary | ICD-10-CM | POA: Insufficient documentation

## 2011-04-12 DIAGNOSIS — I10 Essential (primary) hypertension: Secondary | ICD-10-CM | POA: Insufficient documentation

## 2011-04-12 DIAGNOSIS — Z951 Presence of aortocoronary bypass graft: Secondary | ICD-10-CM | POA: Insufficient documentation

## 2011-04-12 DIAGNOSIS — E7800 Pure hypercholesterolemia, unspecified: Secondary | ICD-10-CM | POA: Insufficient documentation

## 2011-04-12 DIAGNOSIS — R109 Unspecified abdominal pain: Secondary | ICD-10-CM | POA: Insufficient documentation

## 2011-04-12 HISTORY — DX: Atherosclerotic heart disease of native coronary artery without angina pectoris: I25.10

## 2011-04-12 HISTORY — DX: Malignant neoplasm of prostate: C61

## 2011-04-12 HISTORY — DX: Essential (primary) hypertension: I10

## 2011-04-12 LAB — CBC
HCT: 40.8 % (ref 39.0–52.0)
Hemoglobin: 13.6 g/dL (ref 13.0–17.0)
MCH: 29.2 pg (ref 26.0–34.0)
MCHC: 33.3 g/dL (ref 30.0–36.0)
MCV: 87.6 fL (ref 78.0–100.0)
Platelets: 188 10*3/uL (ref 150–400)
RBC: 4.66 MIL/uL (ref 4.22–5.81)
RDW: 15 % (ref 11.5–15.5)
WBC: 7.4 10*3/uL (ref 4.0–10.5)

## 2011-04-12 LAB — DIFFERENTIAL
Basophils Absolute: 0 K/uL (ref 0.0–0.1)
Basophils Relative: 0 % (ref 0–1)
Eosinophils Absolute: 0.4 10*3/uL (ref 0.0–0.7)
Eosinophils Relative: 5 % (ref 0–5)
Lymphocytes Relative: 24 % (ref 12–46)
Lymphs Abs: 1.7 K/uL (ref 0.7–4.0)
Monocytes Absolute: 0.6 K/uL (ref 0.1–1.0)
Monocytes Relative: 8 % (ref 3–12)
Neutro Abs: 4.7 K/uL (ref 1.7–7.7)
Neutrophils Relative %: 63 % (ref 43–77)

## 2011-04-12 LAB — URINALYSIS, ROUTINE W REFLEX MICROSCOPIC
Bilirubin Urine: NEGATIVE
Glucose, UA: NEGATIVE mg/dL
Hgb urine dipstick: NEGATIVE
Ketones, ur: NEGATIVE mg/dL
Nitrite: NEGATIVE
Protein, ur: NEGATIVE mg/dL
Specific Gravity, Urine: 1.023 (ref 1.005–1.030)
Urobilinogen, UA: 1 mg/dL (ref 0.0–1.0)
pH: 5.5 (ref 5.0–8.0)

## 2011-04-12 LAB — COMPREHENSIVE METABOLIC PANEL
AST: 24 U/L (ref 0–37)
Albumin: 3.4 g/dL — ABNORMAL LOW (ref 3.5–5.2)
Alkaline Phosphatase: 72 U/L (ref 39–117)
Chloride: 104 mEq/L (ref 96–112)
Creatinine, Ser: 0.94 mg/dL (ref 0.4–1.5)
GFR calc Af Amer: >60 mL/min (ref >60)
Potassium: 4.4 mEq/L (ref 3.5–5.1)
Total Bilirubin: 0.2 mg/dL — ABNORMAL LOW (ref 0.3–1.2)

## 2011-04-12 LAB — COMPREHENSIVE METABOLIC PANEL WITH GFR
ALT: 19 U/L (ref 0–53)
BUN: 18 mg/dL (ref 6–23)
CO2: 27 meq/L (ref 19–32)
Calcium: 9.8 mg/dL (ref 8.4–10.5)
GFR calc non Af Amer: >60 mL/min (ref >60)
Glucose, Bld: 99 mg/dL (ref 70–99)
Sodium: 139 meq/L (ref 135–145)
Total Protein: 7 g/dL (ref 6.0–8.3)

## 2011-04-12 LAB — LIPASE, BLOOD: Lipase: 22 U/L (ref 11–59)

## 2011-04-12 MED ORDER — IOHEXOL 300 MG/ML  SOLN
100.0000 mL | Freq: Once | INTRAMUSCULAR | Status: AC | PRN
  Administered 2011-04-12: 100 mL via INTRAVENOUS

## 2011-04-24 NOTE — Discharge Summary (Signed)
NAME:  [name], [name]              ACCOUNT NO.:  [id number]   MEDICAL RECORD NO.:  [id number]          PATIENT TYPE:  INP   LOCATION:  1414                         FACILITY:  [facility]   PHYSICIAN:  Courtney Paris, M.D.DATE OF BIRTH:  [birth year]   DATE OF ADMISSION:  05/04/2008  DATE OF DISCHARGE:  05/05/2008                               DISCHARGE SUMMARY   DISCHARGE DIAGNOSES:  1. Right ureteral calculus with hydronephrosis.  2. Right flank pain.  3. Hematuria.  4. Penile prosthesis.  5. History of carcinoma of the prostate.   OPERATION AND PROCEDURES:  Cysto, stent placement by Dr. Isabel Caprice on May 04, 2008.   BRIEF HISTORY:  This 77 year old black male was admitted through the  emergency room with acute right flank pain that began the evening  before.  On CT scan he had an obstructing 11 mm stone in the right mid  ureter with hydronephrosis.  The patient could not get comfortable  without injectable narcotics.  He was admitted for observation and stent  placement.  He was not a candidate for ESL immediately because he had  been taking Plavix and aspirin both of which were discontinued upon  admission.   His past history is significant that he has had recurrent stones in the  past.  He had a small stone in the lower pole of his right kidney last  seen on CT scan about a year ago.  It was nonobstructing and not causing  trouble at that time.  He had a CABG in 2000, parathyroid operation  December 2005 and sees Dr. Sharyn Lull for his medical care.  He also has a  penile prosthesis from many years ago.  I had a stent placed in December  of 2007 thinking he was having stone but when I went to do lithotripsy I  could not find it and took out the stent.  He had ureteroscopy 1998 and  lithotripsy 1999.  He had a mass in the mesentery for which he has seen  Dr. Abbey Chatters but I think this resolved uneventfully.  He has had some  diverticulitis of the left colon in the past.  I have not  seen him since  April of 2008.   The patient was admitted and made comfortable.  His laboratory values  were normal.  He was taken to the operating room for stent placement  after 5:00 on the day of admission.  Dr. Isabel Caprice was able to do a  retrograde and he had a hydronephrosis but the stone was hard to see in  the mid ureter.  A stent was placed 6 French x 26 cm with relief of the  pain.  The following day he was resting comfortably.  His urine was  clear.  He was having a little bit of irritation from the stent but not  bad.  He was sent home on his other medications which included Crestor  10 mg, lisinopril 20 twice daily and atenolol 25 mg daily.  He will stop  his Plavix and aspirin.  He will come see me in the office next week.  We will do a KUB and see if we can see the stone.  If we cannot we will  not be able to do lithotripsy but we probably can do ureteroscopy and  see if we can get the stone out that way.  Sent home in improved  ambulatory condition on a regular diet.     Courtney Paris, [name]  Electronically Signed    HMK/MEDQ  D:  05/05/2008  T:  05/05/2008  Job:  [id number]

## 2011-04-24 NOTE — Op Note (Signed)
NAME:  [name], [name] NO.:  [id number]   MEDICAL RECORD NO.:  [id number]          PATIENT TYPE:  INP   LOCATION:  1414                         FACILITY:  [facility]   PHYSICIAN:  Valetta Fuller, [name]  DATE OF BIRTH:  May 25, 1931   DATE OF PROCEDURE:  05/04/2008  DATE OF DISCHARGE:                               OPERATIVE REPORT   PREOPERATIVE DIAGNOSIS:  Right mid ureteral calculus, 11 mm.   POSTOPERATIVE DIAGNOSIS:  Right mid ureteral calculus, 11 mm.   PROCEDURE PERFORMED:  Cystoscopy with right retrograde pyelogram and  right double-J stent placement, 26 cm x 6 [place].   SURGEON:  Valetta Fuller, [name]   ANESTHESIA:  General.   INDICATIONS:  Mr. [name] is a 77 year old male.  He has been a  longstanding patient of Dr. Vic Blackbird has a history of  nephrolithiasis.  He also has a history of prostate cancer treated with  radiation.  The patient was admitted this morning by Dr. Marcelyn Bruins  after being seen in the ER.  He was diagnosed with an 11-mm right mid  ureteral calculus.  He was put on the schedule for cystoscopy and stent  placement per Dr. Aldean Ast.  Unfortunately, this surgery was delayed,  and he asked me to do the procedure as the on-call physician.  The  patient has been clinically stable.  No elevation of white blood cell  count.  Normal renal function with tolerable discomfort at this point.  No evidence of infection.  The patient presents now for double-J stent  placement to temporize the situation, and he will be definitive surgery  down the road.   TECHNIQUE AND FINDINGS:  The patient was brought to the operating room.  He received preoperative Ancef.  He had successful induction of general  anesthesia and was placed in the lithotomy position.  He was prepped and  draped in the usual manner.  The patient had a rigid penile prosthesis.  Cystoscopy revealed unremarkable anterior urethra and reasonably open  prostatic urethra.   Cystoscopy was difficult due to the length of the  prosthesis.  I was unable to completely endoscope his bladder but was  able to identify the right ureteral orifice.  An open-ended stent was  placed in the right ureteral orifice, and retrograde pyelogram was done.  With fluoroscopy,  I could not see a definitive stone.  There was a  questionable filling defect in the mid ureter with definite proximal  dilation and also dilation of the renal pelvis.  The retrograde was done  with fluoroscopic guidance and interpretation by myself.   Through the open-ended catheter, a guidewire was placed in the right  renal pelvis.  We then placed a 26-cm 6-French double-J stent without  difficulty.  This was confirmed to be in good position with fluoroscopic  as well as visual guidance.  The patient appeared to tolerate the  procedure well.  There were no obvious complications.          ______________________________  Valetta Fuller, [name]  Electronically Signed    DSG/MEDQ  D:  05/04/2008  T:  05/04/2008  Job:  [id number]

## 2011-04-24 NOTE — Discharge Summary (Signed)
NAME:  [name], [name] NO.:  [id number]   MEDICAL RECORD NO.:  [id number]          PATIENT TYPE:  OBV   LOCATION:  5126                         FACILITY:  [facility]   PHYSICIAN:  Zenaida Deed. Mayford Knife, M.D.DATE OF BIRTH:  05-Dec-1931   DATE OF ADMISSION:  01/15/2008  DATE OF DISCHARGE:  01/17/2008                               DISCHARGE SUMMARY   PRIMARY CARE PHYSICIAN:  Dr.  Eduardo Osier. Harwani.   DISCHARGE DIAGNOSES:  1. Viral gastroenteritis.  2. Dehydration.  3. Other chronic diagnoses coronary artery disease.  4. Hypertension.  5. Hyperlipidemia.  6. Status post prostate cancer.  7. Hypoparathyroid, status post parathyroidectomy.   DISCHARGE MEDICATIONS:  1. Aspirin 81 mg p.o. daily.  2. Plavix 75 mg p.o. daily.  3. Crestor on home dose.  4. Lisinopril 10 mg p.o. b.i.d.  5. Toprol XL 50 mg daily.  6. Tylenol 1000 mg p.o. every 6 hours p.r.n.  7. Protonix 40 mg p.o. daily.   CONSULTS:  None.   PROCEDURES:  None.   LABORATORY DATA:  Upon admission, the patient had a white blood cell  count of 5.9, hemoglobin 14.4, platelets of 174, sodium 137, potassium  4.1, creatinine 1.02, glucose of 100.  Urinalysis was normal. Lipase was  18 and LFTs were within normal limits.  On the day of discharge the  patient had a white blood cell count of 4.9, hemoglobin 12.9, platelets  161, sodium 137, potassium 3.6, chloride 107, bicarb 25, creatinine  1.12, glucose 100, C-differential was negative.   BRIEF HOSPITAL COURSE:  This patient is a 77 year old male who was  admitted for nausea and vomiting and diarrhea.  1. Gastroenteritis.  The patient was admitted and placed on IV fluids,      normal saline 100 ml/hour. He was also placed on Zofran for nausea      and vomiting.  The patient continued to have loose stools on      initial admission.  Very watery and nonbloody.  The patient was      afebrile throughout his hospital stay.  Had minimal elevation of      white blood  cell count of 14.4.  The patient's stool was cultured      for C. difficile which came back negative.  On day of discharge the      patient had not had any loose stools for greater than 12 hours and      began to have formed stools.  Was feeling better and therefore was      discharged home post instructions for oral hydration.  The      patient's other chronic issues were stable.  His last lisinopril      was held due to dehydration at the time but it was restarted on      discharge.   DISCHARGE INSTRUCTIONS:  He is to be on a low sodium heart healthy diet.  Has no restrictions on his activity.  He needs to remember to call  Monday for a followup appointment with Dr. Sharyn Lull since his office was  closed today.  He is  to continue to stay hydrated for the next few days  with diluted Gator aide as instructed.  Call Dr. Sharyn Lull  for any  diarrhea, nausea, or vomiting that returns with associated fever.   DISCHARGE CONDITION:  Stable.  He will be [facility] back [facility].      Marisue Ivan, MD  Electronically Signed      Zenaida Deed. Mayford Knife, [name]  Electronically Signed    KL/MEDQ  D:  01/17/2008  T:  01/19/2008  Job:  [id number]   cc:   Eduardo Osier. Sharyn Lull, [name]

## 2011-04-24 NOTE — H&P (Signed)
NAME:  [name], [name] NO.:  [id number]   MEDICAL RECORD NO.:  [id number]          PATIENT TYPE:  EMS   LOCATION:  ED                           FACILITY:  [facility]   PHYSICIAN:  Jamison Neighbor, [name]  DATE OF BIRTH:  November 14, 1931   DATE OF ADMISSION:  05/04/2008  DATE OF DISCHARGE:                              HISTORY & PHYSICAL   REFERRING PHYSICIAN:  Dr. Donnetta Hutching   ADMITTING DIAGNOSIS:  Right ureteral calculus with hydronephrosis.   HISTORY:  This is 77 year old black male has a longstanding history of  kidney stones has undergoing ESWL with Dr. Aldean Ast on at least 3 or 4  occasions.  The patient developed acute pain approximately at midnight  and presented to the emergency room for evaluation.  The patient had a  CT scan which demonstrated an 11 mm stone in the mid right ureter with  hydronephrosis.  The patient could not get comfortably without  injectable narcotics although he is fairly comfortable now having  received morphine.  The patient to be admitted for observation and  probable stent placement and eventually ESWL.  The patient is not a  candidate for ESWL immediately because he has been getting Plavix and  aspirin, both of which will be discontinued.   PAST MEDICAL HISTORY:  Remarkable for multiple stones in the past as  noted above.  He has undergone a lithotripsy on multiple occasions.  Othersurgeries include his coronary bypass graft following his second  myocardial infarction and also has an inflatable penile prosthesis in  place.  His medical history is pertinent for hypertension and coronary  artery disease.   FAMILY HISTORY:  Remarkable for Mother died of myocardial infarction.  There are several family members who have had hypertension.  His father  died in an explosion here in [place].   SOCIAL HISTORY:  Unremarkable.  Does not use tobacco or alcohol.   ALLERGIES:  He has no known allergies.   MEDICATIONS:  Plavix, atenolol,  lisinopril, Crestor, aspirin.   REVIEW OF SYSTEMS:  Pertinent for headaches, occasional blurry vision,  hearing problems especially on the left-hand side, shortness of breath,  constipation, back pain.  The back pain caused him to go out on  disability back in the 1980s.   PHYSICAL EXAMINATION:  He is a well-developed, well-nourished but heavy  male who is comfortable right now due to the fact that he has received  morphine.  HEENT:  Normocephalic, atraumatic.  Cranial nerves II through XII are  grossly intact.  NECK:  Supple.  No adenopathy or thyromegaly.  Respirations are unlabored.  His CABG incision is well-healed.  HEART:  Regular rate and rhythm without murmurs, thrills, gallops, rubs  or heaves.  ABDOMEN:  Pertinent for some right lower quadrant pain but no palpable  masses.  There is some light CVA pain but the pain is moving down  towards the right testicle.  GENITOURINARY:  Pertinent for the inflatable penile prosthesis.  RECTAL:  Deferred to his upcoming surgery when he will likely have stent  placement.  EXTREMITIES:  No cyanosis, clubbing or edema.  Some  fungal changes were  noted in the nail.  NEUROLOGIC:  Grossly intact.   LABORATORY STUDIES:  Show an 11 mm stent in the right mid ureter with  hydronephrosis.   IMPRESSION:  Right major ureteral calculus.   PLAN:  Admit for pain medicine and eventual stent placement prior to  probable ESWL.      Jamison Neighbor, [name]  Electronically Signed     RJE/MEDQ  D:  05/04/2008  T:  05/04/2008  Job:  [id number]

## 2011-04-24 NOTE — H&P (Signed)
NAME:  [name], [name] NO.:  [id number]   MEDICAL RECORD NO.:  [id number]          PATIENT TYPE:  EMS   LOCATION:  MAJO                         FACILITY:  [facility]   PHYSICIAN:  Towana Badger, [name]       DATE OF BIRTH:  1931/06/29   DATE OF ADMISSION:  01/15/2008  DATE OF DISCHARGE:                              HISTORY & PHYSICAL   CHIEF COMPLAINT:  Nausea, diarrhea, and vomiting x3 days.   HPI:  Patient has above history with gradual onset associated with  subjective fever.  He notes intolerance to p.o. solids, and nonbloody,  nonbilious emesis.  He noticed no blood in his stools; he describes his  stools as watery, 5 times per day to start, gradually improving.  Loose  stools following p.o. intake.  Previous to this illness, his weight and  appetite have been good.   Patient notes prior tumors in belly, which were worked up.  His PCP is  not pursuing further investigation.  Review of record notes CT abdomen,  March 2007, with stable mesenteric mass, ? prostatitic metastasis versus  lymphoma.  Brachytherapy seeds in his prostate, diverticula in his  colon, and an implant in his penis noted incidentally.   On further questioning, patient notes lightheadedness and inability to  take care of himself secondary to the intense diarrhea.  He is an older  gentleman who lives at home alone.  He tried for several days to manage  this with supportive care and fluids but was only able to take in p.o.  fluids as necessary to keep down his medications.  With each attempt to  rise and toilet or feed himself, he became severely lightheaded and had  to sit back down.  He feels as though he is unable to care for this at  home any further which prompted his visit to the emergency room.   PAST MEDICAL HISTORY:  1. CAD status post CABG, January 2007.  2. Cardiac cath, February 2008, with EF 55%, with diffuse disease, 1      occluded graft.  3. Hypertension.  4.  Hypercholesterolemia.  5. Remote tobacco abuse.  6. Morbid obesity.  7. Prostate cancer.  8. Primary hyperparathyroidism status post parathyroidectomy of      parathyroid adenoma, 2005.  9. Nephrolithiasis secondary to hypercalcemia.   PAST SURGICAL HISTORY:  1. CABG as noted above.  2. Prostatectomy with brachytherapy.  3. ORIF of the left ankle, remote.  4. Penile implant, remote.  5. Cystoscopy with renal calculi removal.   ALLERGIES:  NO KNOWN DRUG ALLERGIES.   MEDICATIONS:  1. Aspirin 81 mg daily.  2. Plavix 75 mg daily.  3. Crestor 20 mg daily.  4. Lisinopril 10 mg b.i.d.  5. Toprol XL 50 mg daily.   ROS:  Fever, vomiting, diarrhea, fatigue, lightheadedness, failure to  thrive.  Denies weight loss, anorexia, chest pain, cough.   VITALS:  T-max 96.3 degrees Fahrenheit.  Heart rate 87.  Blood pressure  129/86.  Respirations 20.  O2 sat 97% on room air.  GENERAL:  Patient is awake, alert, in no distress.  His mucous membranes  are dry without scleral icterus or JVD.  Skin turgor is generally poor.  HEART:  Regular rate and rhythm without murmur or thrill.  LUNGS:  Clear with a normal work of breathing and no dullness.  ABDOMEN:  Protuberant with lower quadrant diffuse tenderness to  palpation, voluntary guarding, but overall soft.  Positive bowel sounds.  EXTREMITIES:  Warm and well perfused.  The right great toe is notable  for some erythema but is not tender and nonswollen with normal range of  motion.   LABS:  WBC 5.9, hematocrit 14.4, platelets 174.  Complete metabolic  panel entirely within normal limits with a creatinine of 1.02.  Urinalysis normal.  Lipase 18.   ASSESSMENT:  Patient is a 77 year old gentleman with nausea, vomiting,  diarrhea, and fever.  1. Gastrointestinal:  Patient with poor turgor, orthostatic      hypotension subjectively.  He is dehydrated with poor skin turgor      and dry mucous membranes.  He is on a beta-blocker which may blunt       the tachycardic response to dehydration.  He is also on an ACE      inhibitor making him especially susceptible to acute renal failure      in the context of volume depletion.  Based on all these risk factor      and confounding medications, we are going to admit him for      rehydration and overnight observation with physical      therapy/occupational therapy to assess in the morning to confirm      patient's ability to tend to his activities of daily living in an      outpatient setting safely.  2. Cardiac.  We are going to hold his ACE inhibitor in the context of      dehydration for renal preservation.  Patient has no chest pain      complaints and is hemodynamically stable.  He does not appear to be      in the midst of an acute coronary syndrome.  3. Prophylaxis:  Protonix during inpatient stay.  We will continue      Plavix.  We will add Lovenox if patient is to stay for a prolonged      hospital course past a 23 hour observation window.  4. Disposition.  Anticipate discharge home tomorrow pending improving      ambulation and physical therapy/occupational therapy sign off.   I have discussed this history and physical assessment and plan with Dr.  Charissa Bash, family practice service attending.      Towana Badger, [name]  Electronically Signed     JP/MEDQ  D:  01/15/2008  T:  01/16/2008  Job:  [id number]

## 2011-04-27 NOTE — Discharge Summary (Signed)
NAME:  [name], [name]              ACCOUNT NO.:  [id number]   MEDICAL RECORD NO.:  [id number]          PATIENT TYPE:  INP   LOCATION:  6524                         FACILITY:  [facility]   PHYSICIAN:  [name], [name] DATE OF BIRTH:  09/19/1931   DATE OF ADMISSION:  05/29/2006  DATE OF DISCHARGE:  05/31/2006                                 DISCHARGE SUMMARY   ADMITTING DIAGNOSIS:  1.  Recurrent chest pain, rule out myocardial infarction.  2.  Coronary artery disease with a history of myocardial infarction in the      past status post coronary artery bypass grafting in January 2000.  3.  Hypertension.  4.  Dizziness and palpitations, rule out cardiac arrhythmias.  5.  Hypercholesteremia.  6.  Remote history of tobacco abuse.  7.  Morbid obesity.  8.  History of primary hyperparathyroidism.  9.  Questionable mesenteric mass.   FINAL DIAGNOSIS:  1.  Stable angina, myocardial infarction ruled out, negative Persantine      Myoview.  2.  Coronary artery disease with a history of myocardial infarction in the      past status post coronary artery bypass graft.  3.  Status post dizziness.  4.  Hypertension.  5.  Hypercholesteremia.  6.  Remote history of tobacco abuse.  7.  Morbid obesity.  8.  Questionable mesenteric mass.  9.  History of primary thyroidism.   DISCHARGE MEDICATIONS:  Enteric-coated aspirin 81 mg 1 tablet daily, Plavix  75 mg 1 tablet daily with food, Toprol XL 50 mg 1 tablet daily, Lisinopril  10 mg 1 tablet twice daily, Crestor 20 mg 1 tablet daily, Protonix 40 mg 1  tablet daily, Nitrostat 0.4 mg sublingual use as directed.   DISCHARGE INSTRUCTIONS:  Diet is low salt, low cholesterol 1800 calories ADA  diet.  The patient has been advised to avoid sweets and reduce weight,  increase activity slowly as tolerated.  Follow-up with me in one week.   CONDITION AT DISCHARGE:  Stable.   BRIEF HISTORY AND HOSPITAL COURSE:  Mr. Dunklee is a 77 year old black male  with a past medical history significant for coronary artery disease status  post anteroseptal wall myocardial infarction in January 2000, status post  PCI to LAD, subsequently had CABG in April 2000, history of congestive heart  failure, history of parathyroid adenoma questionable mesenteric mass,  hypertension, hypercholesteremia.  He came to the ER complaining of  retrosternal chest tightness and heaviness off and on for the last three  days associated with nausea and mild shortness of breath.  He states, also,  he has occasional palpitations associated with dizziness.  He denies any  syncopal episode.  He denies PND, orthopnea, leg swelling.  He denies fever,  chills, cough. He states he was in the ER last night with vague abdominal  pain associated with nausea and dizziness after eating food, but left  without seeing MD.   PAST MEDICAL HISTORY:  As above.   PAST SURGICAL HISTORY:  CABG in April 2000, he had LIMA to LAD, RIMA to RCA,  and saphenous vein graft to diagonal  one.  He had parathyroidectomy in 2005.  He had an ankle fracture in 2004.  He had a penile implant in 1984.   SOCIAL HISTORY:  He is married, retired, worked as a Naval architect, smoked  one pack per week for 20+ years, quit 25 years ago.  No history of alcohol  abuse.   FAMILY HISTORY:  His father died of accidental death.  His mother died of MI  at age of 49.  One brother died of MI at the age of 44.  One sister died of  kidney failure.   ALLERGIES:  NO KNOWN DRUG ALLERGIES.   MEDICATION AT HOME:  He is on Toprol XL 50 mg p.o. daily, Lisinopril 10 mg  p.o. b.i.d., Plavix 75 mg p.o. daily, Crestor 10 mg p.o. daily.   PHYSICAL EXAMINATION:  On examination he was awake, alert and oriented x3 in  no acute distress.  Blood pressure was 127/92, pulse was 85 and regular.  Conjunctivae was pink.  Neck supple, no JVD, no bruit.  Lungs were clear to  auscultation without rhonchi or rales.  Cardiovascular with regular  rate and  rhythm, S1 and S2 normal, there was a soft systolic murmur at the apex.  There was no S3 gallop.  The abdomen was soft, obese, bowel sounds were  present, nontender.  Extremities showed no clubbing, cyanosis or edema.   EKG done in the ER showed normal sinus rhythm with right bundle branch block  pattern, old septal wall MI.  His point of care, two sets of CPK MB was less  than 1 and troponin-I was less than 0.5.  His cholesterol was 167, LDL was  120, HDL was low at 35.  Two sets of cardiac enzymes by lab, CK 83, MB 1,  second set CK 68, MB 0.90.  Troponin I, two sets were 0.02.  Sodium was 139,  potassium 4.1, chloride 106, bicarb 22, blood sugar was slightly elevated  105, BUN 12, creatinine 1. Hemoglobin was 13.9, hematocrit 41, white count  of 9.2.   BRIEF HOSPITAL COURSE:  The patient was admitted to the telemetry unit.  MI  was ruled out by serial enzymes and EKG.  The patient subsequently underwent  Persantine Myoview which showed no evidence of reversible ischemia with EF  of 63%.  The patient did not have any episodes of chest pain during the  hospital stay. The patient will be discharged home on the above medications  and will be followed up in my office in one week.           ______________________________  [name]. Sharyn [name], [name]     MNH/MEDQ  D:  05/31/2006  T:  05/31/2006  Job:  [id number]

## 2011-04-27 NOTE — Consult Note (Signed)
NAME:  [name], [name] NO.:  [id number]   MEDICAL RECORD NO.:  [id number]                   PATIENT TYPE:  INP   LOCATION:  0162                                 FACILITY:  [facility]   PHYSICIAN:  Adolph Pollack, [name]            DATE OF BIRTH:  1931/07/12   DATE OF CONSULTATION:  DATE OF DISCHARGE:                                   CONSULTATION   REASON FOR CONSULTATION:  Parathyroid adenoma.   HISTORY OF PRESENT ILLNESS:  [name] is a 77 year old man who is a  prisoner. He presented to the emergency department because of severely  altered mental status and was found to have significant leukocytosis as well  as being acute renal failure with a creatinine of 4.0, and malnourished with  an albumin of 2.9. His calcium was 14.9. The patient underwent significant  hydration and treatment for his hypercalcemia. An intact parathyroid hormone  was initially done and it was markedly elevated at 483. At this time his  calcium was 13.7. Workup for potential for the hypercalcemia ensued. It  appeared to be consistent with primary hyperparathyroidism. He has had a  history of prostate cancer as well. A Sestamibi scan was done which  demonstrated findings consistent with an enlarged right parathyroid  gland/parathyroid adenoma possibly intrathyroidal.  Because of the acuteness  of onset, Dr. Adrian Prince who has seen her also has a concern over  parathyroid carcinoma. He currently is obtunded and has been so, so he is  not able to give any history at all.   PAST MEDICAL HISTORY:  From the chart includes coronary artery disease,  hypertension, hyperlipidemia, nephrolithiasis, and prostate cancer.   PREVIOUS OPERATIONS:  Prostatectomy, ORIF of left ankle fracture,  cystoscopy, penile implant, coronary artery bypass graft.   SOCIAL HISTORY:  Smokes a pack a day. 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   FAMILY HISTORY:  Unobtainable.   REVIEW OF  SYSTEMS:  Unobtainable.   PHYSICAL EXAMINATION:  GENERAL: A moderately obese male lying in bed,  essentially unresponsive, but breathing on his own.  VITAL SIGNS: Temperature 99.1, blood pressure 110/62, pulse 94.  NECK: Supple without palpable masses or obvious thyroid enlargement.  RESPIRATORY:  Breath sounds equal and clear. Nonlabored respirations.  CARDIOVASCULAR: Regular rate and rhythm. No jugular venous distention is  noted.  ABDOMEN: Soft. Does not appear to be tender. Active bowel sounds are noted.  NODES: No palpable cervical or supraclavicular adenopathy.   LABORATORY DATA:  Reviewed as well as the Sestamibi scan. His 89 intact PTH  level was 381.  Current calcium level is 11, albumin 1.7.   IMPRESSION:  Primary hyperthyroidism secondary to parathyroid adenoma or  carcinoma. He currently remains obtunded. Also has other significant mental  problems and has resolving acute renal failure as well as coronary artery  disease and hypertension.   RECOMMENDATIONS/PLAN:  I agree that parathyroidectomy would be needed  eventually.  He is currently a prisoner and apparently the family is unaware  that he is here for security reasons. Once his mental status improves and we  can discuss the procedure with him I think we can go ahead and get it  scheduled. Currently, his hypercalcemia is being controlled fairly well  medically with Sensipar and hydration.                                               Adolph Pollack, [name]    [name]  D:  07/20/2004  T:  07/20/2004  Job:  [id number]   cc:   [name], [name]  [address]  [place]  [place] [address]  Fax: [phone]   [name], [name]  [address]  [place]  [place] [address]  Fax: [phone]

## 2011-04-27 NOTE — Discharge Summary (Signed)
[place]  Patient:    [name], [name] Visit Number: [id number] MRN: [id number]          Service Type: MED Location: 2000 2013 01 Attending Physician:  Robynn Pane Admit Date:  07/21/2001 Discharge Date: 07/23/2001                             Discharge Summary  ADMITTING DIAGNOSES:  Unstable angina, rule out myocardial infarction, coronary artery disease status post coronary artery bypass grafting, hypertension, compensated congestive heart failure, history of tobacco abuse.  FINAL DIAGNOSES:  Angina pectoris, myocardial infarction ruled out, negative Persantine Cardiolite, coronary artery disease status post coronary artery bypass grafting, hypertension, compensated congestive heart failure, tobacco abuse.  DISCHARGE MEDICATIONS: 1. Altace 10 mg one capsule q.d. 2. Plavix 75 mg one tablet q.d. with food. 3. Toprol XL 25 mg one tablet q.d. 4. Nitro-Dur 0.2 mg/hour apply to chest wall in a.m., off at night. 5. Xanax 0.25 mg one tablet b.i.d. 6. Lipitor 10 mg one tablet q.d.  ACTIVITY:  As tolerated.  DIET:  Low salt, low cholesterol.  FOLLOW-UP:  With me in two weeks.  CONDITION ON DISCHARGE:  Stable.  HISTORY:  Mr. [name] is a 77 year old black male with a past medical history significant for coronary artery disease status post CABG in April 2000, hypertension, history of congestive heart failure, tobacco abuse who came to the emergency room complaining of retrosternal and pericardial soreness radiating to the neck associated with shortness of breath, diaphoresis, and feeling dizzy.  Patient received four baby aspirin, sublingual nitroglycerin with relief and was started on IV nitroglycerin in the ER.  Patient states he has been feeling fine until last week.  Denies recent history of exertional angina.  Denies PND, orthopnea, or leg swelling. Denies palpitations, lightheadedness, or syncope.  Denies fever or chills,  but complains of dry cough occasionally.  Denies chest pain at present.  PAST MEDICAL HISTORY:  As above.  PAST SURGICAL HISTORY:  Coronary artery bypass grafting as above, right ureteral stent in January 2000, lithotripsy x 2 in July 2002, penile implant 1994.  SOCIAL HISTORY:  He is married.  Retired Naval architect.  Used to smoke one pack per week for 20+ years, quit 20 years ago.  FAMILY HISTORY:  Positive for coronary artery disease.  MEDICATIONS: 1. Plavix 75 mg p.o. q.d. 2. Lopressor 25 mg p.o. b.i.d. 3. Altace 10 mg p.o. q.d. 4. Lasix 40 mg p.o. q.d. 5. K-Dur 20 mEq p.o. q.d.  ALLERGIES:  ______ intolerance.  PHYSICAL EXAMINATION  GENERAL:  He is alert, awake, oriented x 3.  No acute distress.  VITAL SIGNS:  Blood pressure 144/98, pulse 54 and regular.  HEENT:  Conjunctivae:  Pink.  NECK:  Supple.  No JVD or bruits.  LUNGS:  Clear to auscultation without rhonchi or rales.  CARDIOVASCULAR:  S1, S2 was normal.  There was soft systolic murmur.  There was no S4 gallop or S3 gallop.  ABDOMEN:  Soft.  Bowel sounds present.  Nontender.  EXTREMITIES:  No cyanosis, clubbing, or edema.  LABORATORIES:  EKG showed normal sinus rhythm, early repolarization, diffuse ST elevation in infero and anterolateral leads.  No new changes from the prior EKG.  Chest x-ray showed chronically increased lung markings, no active disease.  Persantine Cardiolite scan showed scarring in the anteroseptal apical segment, no evidence of inducible ischemia, resting left ventricular ejection fraction of 50%.  There was septal hypokinesis and mild diaphragmatic attenuation with no evidence of ischemia.  Cholesterol 183, triglycerides 100, HDL 36, LDL elevated 127.  Two sets of cardiac enzymes-troponin I and CPKs were negative.  Sodium 141, potassium 3.5, chloride 108, bicarbonate 21, glucose 103, BUN 12, creatinine 0.9.  Repeat electrolytes:  Sodium 141, potassium 4.2, chloride 109.  Hemoglobin  14.4, hematocrit 41.1, white count 6.0.  HOSPITAL COURSE:  Patient was admitted to telemetry unit.  MI was ruled out by serial enzymes and EKG.  Patient did not have any episodes of chest pain during the hospital stay.  Patient underwent Persantine Cardiolite on August 13 which showed no evidence of reversible ischemia as above.  Patients nitroglycerin drip was discontinued.  Patient did not have any episodes of chest pain and was discharged home on above medications in stable condition on August 14.  Patient will be followed up in my office in two weeks. Attending Physician:  Robynn Pane DD:  08/07/01 TD:  08/07/01 Job: [id number]

## 2011-04-27 NOTE — Discharge Summary (Signed)
NAME:  [name], [name]                        ACCOUNT NO.:  [id number]   MEDICAL RECORD NO.:  [id number]                   PATIENT TYPE:  INP   LOCATION:  5003                                 FACILITY:  [facility]   PHYSICIAN:  Jackie Plum, [name]             DATE OF BIRTH:  1931/11/01   DATE OF ADMISSION:  06/27/2003  DATE OF DISCHARGE:  06/30/2003                                 DISCHARGE SUMMARY   DISCHARGE DIAGNOSES:  1. Right medial malleolus fracture: Nondisplaced.  2. Displaced left bi-malleolar fracture, status post open reduction internal     fixation.  3. Hypertension, poorly controlled.  4. Coronary artery disease status post coronary artery bypass grafting in     2000:  Stable.  5. History of prostate cancer in 2004 status post radiation completed June     2004.  6. Dyslipidemia.  7. History of kidney stones.   DISCHARGE MEDICATIONS:  1. Altace 10 mg p.o. daily.  2. Plavix 75 mg p.o. daily.  3. Toprol XL 25 mg p.o. daily.  4. Aspirin 81 mg p.o. daily.  5. Norvasc 5 mg p.o. daily.  6. Crestor 10 mg p.o. q.h.s.  7. Lovenox 40 mg subcu daily.  8. Ancef 1 gram IV q.8h.  DC after last dose June 30, 2003.  9. Senokot tabs 2 p.o. q.h.s.  10.      Sorbitol 30 mL p.o. x1 on day of discharge.  11.      Oxycodone-IR 5-10 mg p.o. q.4-6 hours p.r.n. pain.  12.      Tylenol 650 mg p.o. q.4-6 hours p.r.n. pain.  13.      Mylanta Plus 30 mL p.o. q.4h. p.r.n.  14.      OxyContin-CR 10 mg p.o. q.12h.   ALLERGIES:  DRISTAN which causes urinary retention.   PROCEDURE:  The patient underwent ORIF of left ankle fracture on June 28, 2003, performed by Dr. Noel Gerold. The patient tolerated the procedure well and  there were no complications.   HISTORY OF PRESENT ILLNESS:  A 77 year old black male who fell while going  down some steps on June 25, 2003. He was evaluated in the ER and found to  have a minimally displaced right ankle fracture and comminuted minimally  displaced left  ankle fracture. The patient was given a prescription for  Vicodin and referred to Dr. Leonides Grills for followup on June 28, 2003. The  patient says that the pain is severe and unrelieved with current meds. He is  unable to bear weight.  He reports fever on day prior to presentation.  The  patient is being admitted for pain control and further evaluation.   HOSPITAL COURSE:  The patient was admitted to a regular bed. Dr. Noel Gerold was  consulted regarding the patient's ankle fractures. He found the patient to  be in need of ORIF. The patient was taken to surgery as noted above. He  tolerated  the procedure well and there were no complications.   The patient's vital signs remained stable throughout his stay. His diagnoses  of dyslipidemia, hypertension, and coronary artery disease did not require  any interventions during this hospitalization. He was maintained on his  outpatient medication regimen and is discharged on same.  BNP during this  visit was 85.7, indicating no evidence of congestive heart failure.   Due to the fact that the patient did sustain bilateral ankle fractures and  is status post ORIF, he was evaluated by the PM and our rehab team at [facility] and found to be appropriate for a short rehab stay on unit  3100 at [facility]. He is being discharged to this unit in stable condition  and will be cared for by Dr. Rosalyn Charters team.   At time of discharge, the patient's temperature is 99.9, blood pressure  142/60, heart rate 68, respirations 20, and room air saturations are 96%.  The patient is free of chest pain, shortness of breath, or other distress.  He does complain of mild to moderate ankle pain which until this time has  been controlled with a PCA pump.   DISCHARGE LABS:  White blood cell count 7.4, hemoglobin 11.8, hematocrit  34.2, platelet count 190, sodium 134, potassium 4.3, glucose 103, BUN 14,  creatinine 0.9, PTT 47, PT 14.5, INR 1.2.   CONSULTS:  1. Dr.  Noel Gerold for Orthopedics.  2. Dr. Riley Kill for Rehab Medicine.   CONDITION ON DISCHARGE:  Stable.   DISPOSITION:  Discharged to rehabilitation unit at [facility] and  Dr. Riley Kill.   FOLLOWUP:  The patient will need his sutures removed in 2 weeks time from  discharge and will need a followup appointment with Dr. Noel Gerold 4 weeks  postoperatively.     [name]. [name], [name]    SMD/MEDQ  D:  06/30/2003  T:  06/30/2003  Job:  [id number]   cc:   [name], [name]  [address]  [place]  [place] [address]  Fax: [phone]   [name], [name]  [address] [place]  [place] [address]  Fax: [phone]    cc:   [name], [name]  [address]  [place]  [place] [address]  Fax: [phone]   [name], [name]  [address] [place]  [place] [address]  Fax: [phone]

## 2011-04-27 NOTE — Discharge Summary (Signed)
NAME:  [name], [name]                        ACCOUNT NO.:  [id number]   MEDICAL RECORD NO.:  [id number]                   PATIENT TYPE:  IPS   LOCATION:  4144                                 FACILITY:  [facility]   PHYSICIAN:  Ranelle Oyster, [name]             DATE OF BIRTH:  Sep 08, 1931   DATE OF ADMISSION:  06/30/2003  DATE OF DISCHARGE:  07/06/2003                                 DISCHARGE SUMMARY   DISCHARGE DIAGNOSES:  1. Bilateral ankle fractures.  2. Left lower extremity edema.  3. Questionable cellulitis, left foot.   HISTORY OF PRESENT ILLNESS:  [name] is a 77 year old male with history  of coronary artery disease and hypertension, who fell on June 25, 2003,  sustaining a minimally displaced avulsion fracture of right ankle and  comminuted, minimally displaced bimalleolar left ankle fracture.  He was  evaluated in the ED and discharged to home with followup recommended.  He  was readmitted on June 27, 2003 for increased pain and difficulty with  ambulation.  The patient was evaluated by Dr. Sharolyn Douglas and underwent left  ankle ORIF on June 28, 2003.  Postop, he is weightbearing as tolerated on  the right lower extremity, nonweightbearing on the left lower extremity.  Sutures are to be discontinued in two weeks.  The patient has had some  problems with constipation postop, otherwise, has been able to progress  along well.  Currently, he is minimal-assist for transfers, minimal-assist  to pivot to chair with pain being limiting factor.   PAST MEDICAL HISTORY:  Past medical history is significant for:  1. Hypertension.  2. Coronary artery disease, status post CABG.  3. Renal calculi requiring lithotripsy.  4. Prostatectomy with penile implant.  5. History of prostate cancer.  6. Dyslipidemia.   ALLERGIES:  Allergies are to DRISTAN.   SOCIAL HISTORY:  The patient lives in a one-level home with one step at  entry and was independent prior to admission.  He is married  and wife can  provide some supervision.  He does not use any tobacco or alcohol.   HOSPITAL COURSE:  [name] was admitted to rehab on June 30, 2003  for inpatient therapies to consist of PT and OT daily.  Past admission, he  was maintained on subcu Lovenox for DVT prophylaxis.  Blood pressure were  monitored on a twice-daily basis and has shown good control.  OxyContin CR  was added to help with better pain relief.  Labs done past admission showed  hemoglobin 11.4, hematocrit 33.5, white count 7.4, platelets 207,000; sodium  135, potassium 4.2, chloride 104, CO2 25, BUN 13, creatinine 1.0, glucose  105.  Constipation has been a big issue and the patient has required  multiple bowel medications to help with evacuation.  He has also had some  complaints of left ankle pain, in part secondary to edema.  His splint was  changed out and  at that time, he was noted to have some erythema and no  drainage from the incision, however.  The patient was started on Cipro for  wound prophylaxis and is to follow up with Dr. Noel Gerold in one week for  reevaluation of wound.  Followup x-rays of left ankle done, results pending  at time of discharge.  During his stay in rehab, Mr. Skow progressed to  being modified independent for transfers.  Basic ambulation was not tested,  as the patient tended to put too much weight on left lower extremity and was  unable to maintain nonweightbearing status during ambulation.  The patient  was supervision with assistance for setup for ADL needs.  Patient to  continue with home health PT and OT by [facility] past discharge.  On July 06, 2003, patient is discharged to home.   DISCHARGE MEDICATIONS:  1. Cipro 250 mg b.i.d.  2. Crestor 10 mg q.h.s.  3. Norvasc 5 mg a day.  4. Coated aspirin 325 mg a day.  5. Plavix 75 mg a day.  6. Toprol-XL 25 mg a day.  7. Altace 10 mg a day.  8. OxyContin CR 20 mg b.i.d. x1 week, then one per day till gone.  9.  Oxycodone IR 5 to 10 mg q.4-6h. p.r.n. pain.   ACTIVITY:  No weight on left leg, use wheelchair or use a walker for  transfers.   DIET:  The diet is regular.   WOUND CARE:  Keep splint in place on left ankle.   SPECIAL INSTRUCTIONS:  No alcohol, no smoking, no driving.   FOLLOWUP:  Patient to follow up with Dr. Noel Gerold on Friday, follow up with Dr.  Julian Reil for a routine check, follow up with Dr. Ranelle Oyster as needed.      Greg Cutter, P.A.                    Ranelle Oyster, [name]    PP/MEDQ  D:  08/11/2003  T:  08/11/2003  Job:  [id number]   cc:   Eduardo Osier. Sharyn Lull, [name]  [address]  [place]  [place] [address]  Fax: [phone]   Sharolyn Douglas, [name]  [address]  [place]  [place] [address]  Fax: [phone]

## 2011-04-27 NOTE — H&P (Signed)
NAME:  [name], [name]                        ACCOUNT NO.:  [id number]   MEDICAL RECORD NO.:  [id number]                   PATIENT TYPE:  INP   LOCATION:  0102                                 FACILITY:  [facility]   PHYSICIAN:  Deirdre Peer. Polite, [name]              DATE OF BIRTH:  04/26/1931   DATE OF ADMISSION:  07/12/2004  DATE OF DISCHARGE:                                HISTORY & PHYSICAL   CHIEF COMPLAINT:  Mental status, per staff.   HISTORY AND PHYSICAL:  Per records and conversation with ED doctor, jail  physician, and nurse practitioner, as the patient is obtunded.   HISTORY OF PRESENT ILLNESS:  [name] is a 77 year old gentleman with a  known history of coronary artery disease, who was brought to the ED from a  dentition facility for mental status change.  In the ED, the patient is  found to have significant lab abnormalities showing leukocytosis, acute  renal failure with creatinine of 4.0, hypercalcemia of 4.9.  CT of the head  was negative.  Chest x-ray was negative.   Per the report from the ED physician, the patient essentially had failure to  thrive for about a week at a nursing home, characterized by decreased po  intake of solids and liquids.  Patient also had significant lethargy and  waning mentation.  Because of that, the patient was transferred to the ED  for evaluation.  At the time of my evaluation, the patient was essentially  obtunded.  Mumbles a few words.  Therefore, is unable to give any additional  information.  I have called the facility where the patient was incarcerated  to get additional information.  Per their report, the patient has been  having problems for approximately two weeks.  Basically, the patient has  been complaining of two problems, difficulty with voiding.  At that time had  labs with a creatinine of 1.5, calcium of 11.4, protein of 7, albumin of  3.6.  Patient had LFTs which were within normal limits.  Per staff, the  patient was  treated with doxycycline 100 mg b.i.d. x10 days from July 21.  Also it seems that the patient was given Phenergan b.i.d. for three days at  that time.  Later on the 28th, it appears that this patient was given  Bactrim DS b.i.d. from July 28 until today.  Also, in retrospect, they say  that the patient had complained of possible symptoms like he had a kidney  stone back in March 11th.  Again, at that time, the patient had labs with  normal BUN and creatinine and at that time, had a calcium of 9.8.  As  stated, the patient is brought to the ED for mentation change and  questionable failure to thrive with the above lab abnormalities.  Patient  will be admitted for further evaluation and treatment, as the patient is  obtunded, cannot obtain any additional information  from them.   PAST MEDICAL HISTORY:  Unable to obtain; however, from looking at the  patient's chest, he has a midline scar, which suggests that he had a CABG in  the past.   MEDICATIONS:  The sheet sent with the patient from the facility shows that  the patient had been getting Lopid, Valium, Indocin, and Zantac.  I have  asked if the patient had been taking Tums or some other calcium-containing  substance, and they are unaware of that, as they do not need to document if  the patient was given such a medication.   SOCIAL HISTORY:  Negative for tobacco.  No alcohol.  No drugs.   ALLERGIES:  Unknown.   FAMILY HISTORY:  Unobtainable.   REVIEW OF SYSTEMS:  Unobtainable.   PHYSICAL EXAMINATION:  VITAL SIGNS:  Temp 97.2, BP 104/78, pulse 98,  respiratory rate 18.  GENERAL:  Patient is obtunded.  Very lethargic.  HEENT:  Anicteric sclerae.  Eyes are midline without gaze preference.  Dry  oral mucosa.  NECK:  No nodes, JVD, or thyromegaly could be appreciated.  CHEST:  Good air movement bilaterally.  CARDIOVASCULAR:  Tachy.  ABDOMEN:  Nontender.  No mass.  EXTREMITIES:  No edema.  Positive clubbing.  NEUROLOGIC:  As stated,  obtunded.  Does respond to noxious stimuli.   DATA:  CT of the head is negative for acute disease.   Chest x-ray:  No apparent disease.   Abdominal ultrasound shows cholelithiasis with gallbladder wall thickening,  consistent with cholecystitis.   CBC:  White count 45,000, hemoglobin 12, hematocrit 38.4, MCV 88, neutrophil  count 92%.  AST 30, ALT 17, bilirubin 0.6.  Sodium 149, potassium 4.9,  chloride 114, carbon dioxide 21, glucose 76, BUN 134, creatinine 4.0, total  protein 7.8, albumin 2.9.  Please note outpatient labs from facility as of  July 21 shows the patient had a creatinine of 1.5 and a calcium of 11.4.  Labs in March, 2005:  The patient had a calcium of 9.8 and creatinine of  0.9.   ASSESSMENT:  1. Mental status change:  Most likely secondary to metabolic derangement.  2. Hypercalcemia of 14.9:  From looking at labs, this is acute onset.     Differential diagnosis includes secondary to renal failure versus     ingestion of some calcium-containing medication, i.e. Tums.  Must also     consider malignancy, i.e., myeloma; however, with acute change, this may     be less likely; however, it needs to be worked out.  3. Acute renal failure:  Differential diagnosis includes hypercalcemia, poor     po intake.  For medications, please note the patient has been on Indocin,     Bactrim, and doxycycline.  Must also rule out intrinsic cause versus     outlet obstruction.  4. Leukocytosis:  Currently at this time without identifiable cause.     Patient's chest x-ray is negative.  His UA is pending.  Patient is     without fever.  5. History of coronary artery disease, status post coronary artery bypass     graft.  6. Abnormal ultrasound showing cholelithiasis with gallbladder wall     thickening, consistent with cholecystitis.   RECOMMENDATIONS:  Patient will be admitted to the intensive care unit and given aggressive IV fluids replacement, Lasix as needed.  Will follow up   serial labs and entertain the need to give bisphosphonate.  As stated, the  cause for the patient's hypercalcemia includes  meds versus malignancy versus  acute renal failure.  Will draw serial calciums, will check an ionized  calcium, will check a PTH, will check a TSH, will check a 24-hour urine for  calcium, will check an S-pep and a U-pep.  As for the patient's  leukocytosis, with past treatments for questionable UTI, an abnormal  ultrasound showing possible cholecystitis, will start with empiric  antibiotics.  Will pan culture and make further recommendations later.  With  history of coronary artery disease, will obtain an EKG, which has been done,  which does show some ST abnormalities in the inferior leads.  Will obtain a  CK and troponin I to rule out  solid MI as the cause for the patient's mentation change.  The police note  does not show that the patient has been on any aspirin, beta blocker, any  other cardioprotective medication.  Will try to obtain old records and make  further recommendations as deemed necessary.                                               Deirdre Peer. Polite, [name]    RDP/MEDQ  D:  07/12/2004  T:  07/12/2004  Job:  [id number]

## 2011-04-27 NOTE — Consult Note (Signed)
NAME:  [name], [name] NO.:  [id number]   MEDICAL RECORD NO.:  [id number]                   PATIENT TYPE:  INP   LOCATION:  0348                                 FACILITY:  [facility]   PHYSICIAN:  Graylin Shiver, [name]                DATE OF BIRTH:  13-Mar-1931   DATE OF CONSULTATION:  07/17/2004  DATE OF DISCHARGE:                                   CONSULTATION   REASON FOR CONSULTATION:  This patient is a 77 year old male who was  admitted to the hospital on July 12, 2004 with altered mental status.  The  etiology of which is unclear, but in talking to Dr. Rito Ehrlich it is felt this  is probably due to severe hypercalcemia.  We were asked to see the patient  in regards to melena.  The nursing staff reported last evening that he had  melena and his hemoglobin and hematocrit had dropped.  No further history is  obtainable from the patient because of his altered mental status.   PHYSICAL EXAMINATION:  GENERAL:  He is arousable, but falls right back off  to sleep.  VITAL SIGNS:  He is nonicteric.  HEART:  Regular rhythm.  No murmurs.  LUNGS:  Clear.  ABDOMEN:  Soft, nontender.   IMPRESSION:  Melena and anemia.  Rule out upper gastrointestinal lesion.   PLAN:  EGD to evaluate upper GI tract.                                               Graylin Shiver, [name]    [name]  D:  07/17/2004  T:  07/17/2004  Job:  [id number]

## 2011-04-27 NOTE — Consult Note (Signed)
NAME:  REY, DANSBY NO.:  [id number]   MEDICAL RECORD NO.:  [id number]                   PATIENT TYPE:  INP   LOCATION:  0162                                 FACILITY:  [facility]   PHYSICIAN:  Valentino Hue. Magrinat, [name]            DATE OF BIRTH:  Feb 02, 1931   DATE OF CONSULTATION:  DATE OF DISCHARGE:                                   CONSULTATION   Azlaan Isidore is a 77 year old [place] man who presented to the  emergency room from a detention facility on July 12, 2004 because of  altered mental status.  In the emergency room, he was found to have a white  cell count of 45,000, hemoglobin of 12.9, and a platelet count of 305,000.  The absolute neutrophil count was 41.3 thousand.  He also had a BUN of 134,  creatinine 4.0, and an albumin of 2.9 with a calcium of 14.9.   Over the last few days, the patient has been hydrated, and his latest serum  creatinine is down to 1.4 with a BUN of 27.  His calcium has decreased  somewhat to 13.0.  His hemoglobin dropped from 11.1 on August 7th to 8.4 on  August 8th, and he had an EGD showing peptic ulcer disease.  His  hypercalcemia was also evaluated with a parathyroid hormone level, which was  markedly elevated at 483 at a time when his calcium was 13.7.  Other  relevant labs in this man with a history of remote prostatectomy for  prostate cancer (I do not have that history) is a PSA of 0.7.  He also had  serum protein electrophoresis, which showed a polyclonal increase, and a  urine FE which showed a minimal amount of monoclonal lambda light chain (30  mg/dl).  There was a polyclonal kappa light chain amount of 15.5 mg/dl.  The  total protein was 47 mg/dl.  The total urine protein over 24 hours was 413  mg.  With this information, the patient is referred for further evaluation.   PAST MEDICAL HISTORY:  Significant for coronary artery disease, status post  CABG with a Cardiolite study in March, 2004 showing an  ejection fraction of  53%.  Patient has a hypertension, hyperlipidemia, history of renal stones,  status post lithotripsy.  History of prostate cancer, status post  prostatectomy and status post penile implant (I do not have those records).  Status post ORIF of the left lateral malleolus some years ago.  A history of  tobacco abuse.   FAMILY HISTORY:  Patient is married, but I do not have any further history  present.   SOCIAL HISTORY:  Unable to obtain from the patient, who remains fairly  obtunded.   REVIEW OF SYSTEMS:  When I ask the patient how he is, he tells me he is  fine.  When I ask him where he is, he tells me that he is at home.  I am  unable  to obtain a reliable review of systems at this point.   PHYSICAL EXAMINATION:  VITAL SIGNS:  Blood pressure 142/64, heart rate 84,  respiratory rate 20, and room air saturation 98%.  GENERAL:  He is a middle-aged black male.  The patient has a Foley in place  and has handcuffs to the bed with a police officer waiting in the room.  LUNGS:  Clear to auscultation and percussion.  HEART:  Regular rate and rhythm.  ABDOMEN:  Benign.   LAB WORK:  The most current CBC shows a white cell count of 20.6, hemoglobin  9.8, and a platelet count of 278,000.  The most recent BMET showed a sodium  of 153, potassium 3.3, chloride 121, glucose 112, BUN 27, creatinine 1.4,  calcium 13.0.   FILMS:  The patient had a CT of the brain.  The skull bones showed no lytic  lesions.   He also had LS-spine films on x-ray, which showed no lytic lesions.   IMPRESSION/PLAN:  67. A 77 year old [place] man with hypercalcemia secondary to primary     hyperparathyroidism.  Further treatment of the hyperparathyroidism and     hypercalcemia per Dr. Evlyn Kanner.  2. History of prostate cancer with a PSA in excess of 0.2 but less than 1.     I do not have prior records on this patient but will try to obtain when     his mental status improves.  The patient will need a  bone scan at some     point to make sure that he does not have measurable or metastatic     disease, which would make him eligible for studies.  The current standard     for patients who have PSA recurrence only, unless it is rapidly rising,     is observation alone.  3. A small amount of lambda monoclonal light chains in the urine.  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  It will require followup.  At this point, I     would not proceed to bone marrow biopsy.   Will follow the patient with you to resolution of these questions.  Appreciate the consult.                                               Valentino Hue. Magrinat, [name]    Ronna Polio  D:  07/18/2004  T:  07/18/2004  Job:  [id number]   cc:   Deirdre Peer. Polite, [name]   Eduardo Osier. Sharyn Lull, [name]  [address]  [place]  [place] [address]  Fax: [phone]   Sharolyn Douglas, [name]  [address]  [place]  [place] [address]  Fax: [phone]   Tera Mater. Evlyn Kanner, [name]  [address]  [place]  [place] [address]  Fax: [phone]

## 2011-04-27 NOTE — Op Note (Signed)
[place]  Patient:    [name], [name]                     MRN: [id number] Proc. Date: 04/29/01 Adm. Date:  [id number] Attending:  Katherine Roan                           Operative Report  PREOPERATIVE DIAGNOSIS:  Three large proximal right ureteral stones.  POSTOPERATIVE DIAGNOSIS:  Three large proximal right ureteral stones.  OPERATION PERFORMED:  Cystoretrograde pyelogram and insertion of right ureteral stent.  SURGEON:  Rozanna Boer., [name]  ANESTHESIA:  General.  INDICATIONS FOR PROCEDURE:  This 77 year old black male was admitted with three large proximal ureteral stones all over 1.5 cm for cysto stent placement.  He has had pain off and on for a year.  He last passed a stone about a month ago.  He had previous lithotripsy around November 99 at in [facility] and was in the emergency room two days ago with nausea, vomiting, fever, pain and right lower quadrant pain.  He is admitted now for cysto stent placement because of the large volume of stones prior to anticipated lithotripsy later this week.  He had a previous CABG April 2000 and had penile implant April 94.  DESCRIPTION OF PROCEDURE:  The patient was placed on the operating room in dorsal lithotomy position.  He had to have a right jugular stick for an IV access.  An LMA was used but leaked and he had to be intubated but all this was done without complication.  He was prepped and draped with Betadine in the usual sterile fashion and a 21 panendoscope was passed without difficulty.  It looked like he had a previous TUR ____________ but the bladder was entered and carefully inspected and no bladder mucosal lesions seen.  The right orifice was catheterized with an open ended 6 ureteral catheter.  Retrograde demonstrated the stones in the proximal ureter as noted before with some hydronephrosis noted.  A guide wire went past the stones without  difficulty and over the guide wire a 6 [place] multilength ureteral stent was then passed under fluoroscopy.  As the guide wire was removed, the stent was in good position.  Bladder drained.  Scope removed and the patient taken to recovery room in good condition to be later discharged as an outpatient. DD:  04/29/01 TD:  04/29/01 Job: [id number]

## 2011-04-27 NOTE — Discharge Summary (Signed)
NAME:  [name], [name] NO.:  [id number]   MEDICAL RECORD NO.:  [id number]                   PATIENT TYPE:  INP   LOCATION:  0352                                 FACILITY:  [facility]   PHYSICIAN:  [name], [name]             DATE OF BIRTH:  1931-04-12   DATE OF ADMISSION:  07/12/2004  DATE OF DISCHARGE:                                 DISCHARGE SUMMARY   DISCHARGE DIAGNOSES:  1.  Altered mental status secondary to metabolic etiology, hypercalcemia and      elevated BUN.  2.  Primary hyperparathyroidism, right parathyroid adenoma.  3.  Hypercalcemia secondary to #2.  4.  Acute renal failure, secondary to hypercalcemia.  Primary toxic effect      and volume depletion.  Cannot rule out nonsteroidal anti-inflammatory      sulfa component, but less likely, per nephrology.  5.  Upper gastrointestinal bleed, duodenitis and multiple gastric erosions,      per esophagogastroduodenoscopy, done on July 17, 2004.  6.  Cholelithiasis/cholecystitis.  7.  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  8.  History of coronary artery disease.  9.  History of hyperlipidemia.  10. History of prostate cancer, status post radiation.   CONSULTATIONS:  1.  Nephrology, Dr. Darrick Penna.  2.  Endocrinology.  3.  Surgery, Dr. Abbey Chatters.  4.  Gastroenterology, [place], Dr. Evette Cristal.  5.  Oncology, Dr. Darnelle Catalan.   PROCEDURE/STUDIES:  1.  EGD, done on July 17, 2004, per Dr. Evette Cristal.  2.  PICC line placement, July 24, 2004, right upper extremity.  3.  Sestamibi scan of the parathyroid.  Findings compatible with right      parathyroid adenoma.  This projects over the inferior right thyroid      lobe.  4.  Bone scan.  There is a slight but definite increased area of acuity      associated with the left distal femur.  Also, mild symmetrical increase      in activity associated with the risk and possibility of generalized  increase in bone metabolism, as would be suspected in a patient with      parathyroid adenoma and elevated calcium.  There is suggestion of      minimal focal increase in activity associated with the posterior aspect      of the right fourth rib.  5.  Echocardiogram done on July 13, 2004.  Overall, left ventricular      systolic function normal.  The ejection fraction is 75-65%.  The study      was inadequate for left ventricular regional wall motion evaluation.      There is mild asymmetric septal hypertrophy, and there is increased      relative contribution of atrial contraction to left ventricular filling.      Aortic valve thickness mildly increased.   HISTORY:  The patient is a 77 year old black male  with known history of  coronary artery disease, brought [facility] the ER from a detention facility with  mental status changes.  In the ER, the patient was found to have significant  abnormalities, including leukocytosis and elevated creatinine.  Also  elevated calcium.  The CT scan was negative.  The chest x-ray was also  negative.  Per report from the ER physician, the patient essentially had  failure to thrive for a week at the nursing home, decreased p.o. intake of  both solids and liquids, and he was also very lethargic with waning  mentation.  Because of this, the patient was transferred to the ER for  evaluation.  In the ER, the patient was essentially obtunded, mumbled a few  words, so was unable to give any additional information.  From the facility  that the patient came from, it was reported that the patient had been having  problems for about two weeks, including problems with difficulty voiding.  At the time he was evaluated, his creatinine was 1.5.  His calcium was 11.4  with a protein of 7, albumin 3.6.  The patient also had LFTs which were  within normal limits.  According to the staff, the patient was treated with  doxycycline b.i.d. for 10 days from July 21.  Also, the  patient was given  Phenergan b.i.d. for about three days.  Later, about the 28th of July, the  patient was also given Bactrim DS b.i.d. from the 28th of July until the  time he presented to the ER.  Also, in retrospect, it was reported that the  patient complained of symptoms like a kidney stone back in March 11th.  At  that time, his labs revealed a normal BUN and creatinine, and his calcium  was 9.8.  As stated, the patient was brought to the emergency room for  change in mental status and questionable failure to thrive with the above  lab abnormalities.  He was admitted to the hospitalist service for further  evaluation and management.   PHYSICAL EXAMINATION ON ADMISSION:  VITAL SIGNS:  Temperature 97.2, blood  pressure 104/78.  His pulse 98, respiratory rate 18.  GENERAL:  He was obtunded, very lethargic.  HEENT:  Sclerae are anicteric. Eyes were midline without gaze preference.  Dry mucous membranes.  NECK:  There was no adenopathy.  No JVD.  No thyromegaly appreciated.  LUNGS:  He had good air movement bilaterally.  HEART:  He was tachycardic.  ABDOMEN:  Nontender.  No masses.  EXTREMITIES:  No edema.  NEUROLOGIC:  There was clubbing present on his neuro exam.  As stated, he  was obtunded and responding only to noxious stimuli.   LABORATORY DATA:  CT scan of the head was negative for acute disease.   Chest x-ray showed no apparent disease.   The abdominal ultrasound showed cholelithiasis with gallbladder thickening,  consistent with cholecystitis.   His white cell count was 45,000.  His hemoglobin was 12.  His hematocrit was  38.4.  His MCV was 88.  His neutrophil count was 92%.  The AST was 30.  ALT  17.  Bilirubin 0.6.  Sodium 149, potassium 4.9, chloride 114, CO2 21,  glucose 76, BUN 134.  His creatinine was 4.0 with a total protein of 7.8.  Albumin 2.9.   HOSPITAL COURSE:  Problem #1:  Altered mental status secondary to hypercalcemia and elevated BUN:  Upon admission,  a CT scan of the head was  done, and as stated above,  it did not show any acute disease.  The patient  was started on IV fluids and also later on Sensipar to treat the  hypercalcemia.  Gradually, as the hypercalcemia resolved, the patient's  mental status changes also resolved.  The patient has been alert, lucid,  conversant, and appropriate.  He is medically stable at this time for  discharge to a skilled nursing facility/rehab.  Problem #2:  Primary hyperparathyroidism/right parathyroid adenoma:  As  stated above, the patient had hypercalcemia upon admission.  He had a  sestamibi scan done, and it revealed a right parathyroid adenoma.  Endocrinology was consulted and followed the patient while in the hospital.  Also, surgery was consulted, and Dr. Abbey Chatters saw the patient in the  hospital and recommended that he follow up with him as an outpatient for  surgery 2-3 weeks after discharge.  Problem #3:  Hypercalcemia secondary to #2:  Upon admission, the patient was  started on IV fluids with Lasix as needed.  Nephrology was consulted and saw  the patient, and the patient was treated with IV KCL as well as Sensipar to  stimulate his calcium sensor and receptor.  The patient was also treated  with IV sodium phosphate to replace his phosphate.  The patient responded to  the above interventions, and his last calcium on the 28th of August was 9.8  and his phosphorus 2.5.  His magnesium was 1.8.  The patient has remained  hemodynamically stable, appropriate, tolerating p.o. well, and ready for  discharge to a skilled nursing facility, as assigned.  Problem #4:  Acute renal failure secondary to hypercalcemia:  Primary toxic  effect and volume depletion.  Cannot rule out NSAID/sulfa component.  As  already discussed, the patient was started on IV fluids, and nephrology was  consulted.  Dr. Darrick Penna saw the patient in the hospital.  Urine  eosinophils were done, and this was negative.  A UPEP  was done, and a  monoclonal spike was noted.  Oncology, Dr. Darnelle Catalan, was consulted.  The  patient's BUN and creatinine gradually improved with the above  interventions.  On August 28th, his BUN was 3 with a creatinine of 0.4 and a  good urine output.  Problem #5:  Monoclonal spike:  Following this finding on the UPEP, oncology  was consulted, and Dr. Darnelle Catalan saw the patient in the hospital.  Urine  studies were done and showed a small amount of lambda light chains in the  urine.  Also, plain films were done, skeletal survey, and no lytic bone  lesions were found.  Because of this, Dr. Darnelle Catalan stated that this was more  than likely monoclonal gammopathy of uncertain significance.  The patient is  to follow up with oncology, Dr. Darnelle Catalan, following discharge.  Problem #6:  Upper gastrointestinal bleed:  The patient's H&H dropped he was  in the hospital.  Gastroenterology was consulted and did an EGD.  It showed multiple gastric erosions and duodenitis but no active bleeding.  No visible  vessel seen.  The patient was transfused packed red blood cells.  His H&H  remained stable the rest of his hospital stay.  He also had a stool guaiac  done, and it was negative.  The patient's H&H on August 06, 2004 was 10.2  and 31.1.  Problem #7:  Cholelithiasis/cholecystitis:  The patient was empirically  treated with antibiotics with resolution of the leukocytosis.  The patient  did not have any complaints of abdominal pain while in the hospital.  The  patient's last WBC prior to discharge was 7.2, and he has remained afebrile  and hemodynamically stable.  Problem #8:  Hypernatremia:  The patient had an elevated sodium while in the  hospital, 149, and this was thought to be secondary to volume depletion.  Patient was hydrated, and this gradually improved.  His last sodium on  August 28th was 137.  Problem #9:  Malnutrition:  As discussed earlier, the patient had poor p.o.  intake.  His prealbumin in  the hospital was 8.7, and the patient was started  on tube feedings.  His swallowing was assessed, and this was noted to be  within normal limits.  So, as his altered mental status resolved, he was  started on p.o. during the day with nocturnal tube feedings.  This continued  for some time but while in the hospital, the feeding tube fell out, and the  patient stated that he would rather not have the feeding tube replaced.  Also, his intake had improved.  Nutrition followed him while he was in the  hospital.  His p.o. intake continued to improve.  On the calorie count that  was done, he was eating 25-100% of his meals, as his p.o. intake was  improving.  Problem #10:  History of coronary artery disease:  Patient remained chest  pain free throughout his hospital stay.   DISCHARGE MEDICATIONS:  1.  Protonix 40 mg p.o. b.i.d.  2.  Neutra-Phos 1 packet p.o. q.i.d.  3.  KCL 20 mEq p.o. q.d.  4.  Albuterol/Atrovent nebulizers q.4-6h. p.r.n.  5.  Senokot 1 tablet p.o. q.h.s. p.r.n.  6.  Resource nutritional supplement 240 cc p.o. t.i.d.  7.  Lopid 600 mg 1 p.o. b.i.d.   FOLLOW-UP CARE:  1.  Dr. Abbey Chatters, surgeon, in 2-3 weeks.  Phone number [phone].  2.  Dr. Darnelle Catalan in 1-2 weeks.  Phone number [phone].  3.  Dr. Sharyn Lull, cardiologist, as scheduled.  4.  Patient to follow up with primary care physician, Dr. Adrian Prince, in      1-2 weeks.                                               [name], [name]    ACV/MEDQ  D:  08/07/2004  T:  08/07/2004  Job:  [id number]   cc:   Jeannett Senior A. Evlyn Kanner, [name]  [address]  [place]  [place] [address]  Fax: [phone]   Eduardo Osier. Sharyn Lull, [name]  [address]  [place]  [place] [address]  Fax: [phone]   Adolph Pollack, [name]  [address]  [place]  [place] [address]  Fax: [phone]

## 2011-04-27 NOTE — Op Note (Signed)
NAME:  Kevin, [name] NO.:  [id number]   MEDICAL RECORD NO.:  [id number]                   PATIENT TYPE:  INP   LOCATION:  0348                                 FACILITY:  [facility]   PHYSICIAN:  Graylin Shiver, [name]                DATE OF BIRTH:  07-Jul-1931   DATE OF PROCEDURE:  07/17/2004  DATE OF DISCHARGE:                                 OPERATIVE REPORT   PROCEDURE:  Esophagogastroduodenoscopy with biopsy.   INDICATIONS:  Melena.   PREMEDICATIONS:  Only spray to the oropharynx was given. Because of the  patient's decrease in mental status, no IV sedation was necessary.   PROCEDURE:  With the patient in the left lateral decubitus position, the  Olympus gastroscope was inserted into the oropharynx and passed into the  esophagus.  It was advanced down the esophagus, then into the stomach, then  into the duodenum.  The second portion of the duodenum looked normal.  The  bulb of the duodenum showed erythema, compatible with duodenitis.  The  stomach showed multiple gastric erosions in the antrum and lower body.  Also  in the antrum, there was a 1 cm antral ulcer.  This was biopsied.  There was  a smaller 5 mm antral ulcer, which was not biopsied.  The fundus and cardia  showed erythema.  The esophagus looked normal.  He tolerated the procedure  well without complications.   IMPRESSION:  1. Duodenitis.  2. Multiple gastric erosions.  3. A 1 cm antral ulcer.  4. Smaller 5 mm antral ulcer.   PLAN:  The biopsies will be checked.  The patient will remain on Protonix.   I saw no evidence of a visible vessel on this examination or any evidence of  active bleeding at this time; however, I do feel that findings on the upper  endoscopy would be the cause of his melena.                                               Graylin Shiver, [name]    Germain Osgood  D:  07/17/2004  T:  07/17/2004  Job:  [id number]

## 2011-04-27 NOTE — Discharge Summary (Signed)
NAME:  [name], [name]                        ACCOUNT NO.:  [id number]   MEDICAL RECORD NO.:  [id number]                   PATIENT TYPE:  INP   LOCATION:  0352                                 FACILITY:  [facility]   PHYSICIAN:  Theone Stanley, MD                DATE OF BIRTH:  [birth year]   DATE OF ADMISSION:  07/12/2004  DATE OF DISCHARGE:  08/11/2004                                 DISCHARGE SUMMARY   ADDENDUM:  Please see discharge diagnosis and hospital course from July 12, 2004 to August 08, 2004 in the context of [name], [name] discharge  summary.   On August 08, 2004, the patient was doing well, he had no major complaints.  His calcium remained stable at 9.9. His renal failure had resolved. There  was no evidence of any further GI bleed, his hemoglobin and hematocrit were  stable.  The 31st, the patient was very upset about his social situation in  regards to his wife not helping him.  There was no new medical issues at  that time, calcium remained the same.  By August 11, 2004, it was felt  that the patient could go home in stable condition since she had a place to  go to.  Of note, towards the end of his discharge, his wife stated that he  was complaining that somebody had shot him although during his whole stay  this was never mentioned to any of the staff. When I was going to discharge  him, I asked him about this he said well yeah I thought somebody had shot  me and I said do you feel if anybody is trying to kill you or if your life  is in danger or you feel like you are going to kill yourself and he stated  no so although he has made these statements to his wife he never made any  to the medical staff. Because of this, we felt it was safe to let him leave  in stable condition.   FOLLOW UP:  The patient is to followup with Dr. Abbey Chatters in 2-3 weeks for  discussion and removal of the parathyroid. He is to followup with his  primary care physician, Dr. Reuben Likes,  in two weeks. He is to have a BNP in two  weeks in either Dr. Abbey Chatters or Dr. Bynum Bellows office.   DISCHARGE MEDICATIONS:  1.  __________ 40 mg, 1 p.o. b.i.d. x2 weeks and then 1 p.o. q.d.  2.  Neutra-Phos 1 pack q.i.d.  3.  Potassium 20 mEq 1 p.o. q.d.   I told the patient that as long as he takes his Neutra-Phos one pack q.i.d.  that his calcium should be under control and that he needs to followup with  Dr. Abbey Chatters for definitive treatment of his hyperparathyroidism.  Theone Stanley, MD    AEJ/MEDQ  D:  08/15/2004  T:  08/16/2004  Job:  [id number]   cc:   Dr. Kathlee Nations, [name]  [address]  [place]  [place] [address]  Fax: [phone]

## 2011-04-27 NOTE — Op Note (Signed)
NAME:  Kevin, [name] NO.:  [id number]   MEDICAL RECORD NO.:  [id number]          PATIENT TYPE:  AMB   LOCATION:  DAY                          FACILITY:  [facility]   PHYSICIAN:  Jamison Neighbor, [name]  DATE OF BIRTH:  1931/09/27   DATE OF PROCEDURE:  11/22/2006  DATE OF DISCHARGE:                               OPERATIVE REPORT   PREOPERATIVE DIAGNOSIS:  Right proximal ureteral calculus.   POSTOPERATIVE DIAGNOSIS:  Right proximal ureteral calculus.   PROCEDURE:  Cystoscopy, right retrograde, right double-J catheter  insertion.   SURGEON:  Jamison Neighbor, [name]   ANESTHESIA:  General.   COMPLICATIONS:  None.   DRAINS:  A 6 French x26 cm double-J catheter.   BRIEF HISTORY:  This 77 year old male is a patient of Dr. Aldean [name].  The patient is presented with a stone in the proximal ureter with  significant hydronephrosis.  This is a large stone and will be unable to  pass.  The patient is going to require ESWL therapy.  Dr. Aldean [name] has  requested a stent be placed to decompress the patient and eliminate some  of his pelvic pain.  The patient is now to undergo cysto and stent  placement.  The patient understands the risks and benefits of the  procedure and gave full informed consent.   PROCEDURE:  After successful induction of general anesthesia, the  patient is placed in the dorsal lithotomy position, prepped with  Betadine, and draped in the usual sterile fashion.  Inspection of the  genitalia shows the patient has a penile implant in place and some  degree of auto-inflation had taken place, as the patient was partially  erect.  The penis was deflated, and while there may have been a little  bit of fluid left within the penile shaft, it was primarily deflated.   Cystoscopy was then performed.  The urethra was visualized in its entire  anatomy beyond the verumontanum.  The prostate fossa was wide open.  The  patient has had prostatic radiation therapy in this  area, but there was  no evidence of any real scarring or stricturing.  The bladder neck was  wide open.  The bladder itself was inspected.  No tumors or stones could  be seen.  The two ureteral orifices were normal in configuration and  location.  The right ureter was cannulated, and a retrograde study was  performed.   Right retrograde study was done through a 6 French ureteral catheter.  The distal ureter was normal in its appearance.  It should be noted that  the picture was somewhat light in its appearance because of the  patient's large body habitus.  In the proximal ureter, there was marked  hydronephrosis, including the collecting system.  There were no  irregularities within the collecting system proper.  This was all felt  to be due to the proximal ureteral stone that had been previously  identified.   After the completion of the retrograde study, a guidewire was passed up  to the kidney and allowed to coil normally within the kidney.  Using  direct  vision, a double-J stent was passed over the guidewire, allowed  to coil normally  within the renal pelvis as well as within the bladder.  The bladder was  drained.  The patient tolerated the procedure well and was taken to the  recovery room in good condition.  He will be sent home with Tylox for  pain, Pyridium Plus for any discomfort, and will be given Septra for  several days.           ______________________________  Jamison Neighbor, [name]  Electronically Signed     RJE/MEDQ  D:  11/22/2006  T:  11/22/2006  Job:  [id number]

## 2011-04-27 NOTE — Op Note (Signed)
NAME:  [name], [name]                        ACCOUNT NO.:  [id number]   MEDICAL RECORD NO.:  [id number]                   PATIENT TYPE:  INP   LOCATION:  5003                                 FACILITY:  [facility]   PHYSICIAN:  Sharolyn Douglas, [name]                     DATE OF BIRTH:  1931/08/31   DATE OF PROCEDURE:  06/28/2003  DATE OF DISCHARGE:                                 OPERATIVE REPORT   PREOPERATIVE DIAGNOSIS:  Left bimalleolar ankle fracture equivalent.   POSTOPERATIVE DIAGNOSIS:  Left bimalleolar ankle fracture equivalent.   PROCEDURE:  ORIF left lateral malleolus.   SURGEON:  Sharolyn Douglas, [name]   ASSISTANT:  Verlin Fester, P.A.   ANESTHESIA:  General endotracheal.   TOURNIQUET TIME:  47 minutes.   COMPLICATIONS:  None.   INDICATIONS FOR PROCEDURE:  The patient is a 77 year old male who fell three  days ago. He suffered a right medial malleolar avulsion fracture and a left  bimalleolar equivalent ankle fracture. He had tenderness and swelling both  medially and laterally. X-rays showed widening of the medial clear space  with displaced lateral malleolus fracture. Risks, benefits, and alternatives  to ORIF of the left ankle were extensively discussed with the patient and  his wife and they elected to proceed.   DESCRIPTION OF PROCEDURE:  The patient was properly identified in the  holding area, taken to the operating room where he underwent general  endotracheal anesthesia without difficulty. He was given prophylactic IV  antibiotics. He was carefully positioned supine on the operating room table.  A tourniquet was placed on the left thigh. The ankle and leg were prepped  and draped in the usual orthopedic sterile fashion. The limb was  exsanguinated using an Esmarch and the tourniquet inflated to 350. A 7 cm  incision was made starting at the tip of the lateral malleolus and extending  proximally over the fibula. Dissection was carried down to the bone. Care  was taken  proximally to protect the superficial perineal nerve. The fracture  site was identified. The fragments were clearly identified. A bone tenaculum  was used to reduce the fracture fragment near anatomically. Fluoroscopy was  utilized to confirm good reduction of the ankle mortis. A lag screw was  placed across the fracture fragments using an over drilling technique. A  seven hole 1/3 semitubular plate was placed with three screws placed  proximally and three cancellous screws distally. X-rays showed excellent  reduction of the fracture fragments and anatomic alignment of the ankle  mortis. The wound was irrigated. The wound was closed in layers using 2-0  Vicryl followed  by a running 3-0 nylon suture to approximate the skin. A sterile dressing  was applied. Posterior splint with plenty of padding was placed. The  tourniquet was dropped. The patient was extubated without difficulty and  transferred to the recovery room in stable condition.  Sharolyn Douglas, [name]    MC/MEDQ  D:  06/28/2003  T:  06/29/2003  Job:  [id number]

## 2011-04-27 NOTE — Consult Note (Signed)
NAME:  [name], [name] NO.:  [id number]   MEDICAL RECORD NO.:  [id number]                   PATIENT TYPE:  INP   LOCATION:  0162                                 FACILITY:  [facility]   PHYSICIAN:  Llana Aliment. Deterding, [name]            DATE OF BIRTH:  April 05, 1931   DATE OF CONSULTATION:  07/19/2004  DATE OF DISCHARGE:                                   CONSULTATION   HISTORY OF PRESENT ILLNESS:  This is a 77 year old gentleman with a history  of coronary artery disease status post CABG in 2000, history of increased  lipids, DJD, calcium oxalate renal stones in 2002 requiring lithotripsy,  history of prostatectomy and radiation therapy for prostate cancer, history  of penile implant, hypertension.  Apparently recently incarcerated.  He had  a UTI since July 27 and was treated with Bactrim until August 3.  He also  has been on Indocin at the facility where he has been kept.  He became  lethargic and confused and admitted with altered mental status on August 3.  Calcium was 14.9, creatinine 4, BUN 134.  With hydration, calcium has  decreased to 12.8 and creatinine to 1.4.  He cannot give a history.  He has  had problems with recurrent increase in serum sodium, decreased potassium,  as well as chronic hypercalcemia at this time.  He has a history of GI bleed  with multiple erosions in his intestines.   His laboratory data on February 10, 2001 showed a sodium of 138, potassium 4.9,  chloride of 107, bicarbonate of 27, creatinine of 1.1, BUN of 11, calcium of  11.2.  On July 01, 2003, sodium 135, potassium 4.2, chloride of 104,  bicarbonate of 25, creatinine 1, BUN 13, calcium was 10.5 with an albumin of  2.6.  On July 12, 2004 when he presented, sodium 148, potassium 4.9,  chloride of 114, bicarbonate of 21, creatinine of 4, BUN 134, calcium 14.9,  and an albumin of 2.9.  On August 4, sodium 146, potassium 3.6, chloride of  119, bicarbonate 22, creatinine 2.3, BUN of  102, calcium 12.5, albumin 2.3.  Intake and output that day was 3575 in and 1615 out.  On August 6, sodium  145, potassium 3.2, chloride of 115, bicarbonate of 23, creatinine of 1.7,  BUN of 66, calcium of 12.8.  Intake and output was 3500 and 2090 that day.  On August 8, sodium 149, potassium 3.4, chloride of 122, CO2 of 23,  creatinine of 1.4, BUN of 37, calcium of 12.8, 2100 in and 5000 out.  On  August 10, sodium was 155, potassium 2.7, chloride of 118, bicarbonate of  30, creatinine 1.5, BUN of 21, calcium 12.9, albumin of 1.8.   PAST MEDICAL HISTORY:  Includes illnesses listed above.   MEDICATIONS:  1. Lopid.  2. Indocin.  3. Zantac.  4. Valium.   SOCIAL HISTORY:  Negative for tobacco, no alcohol, no drugs.  ALLERGIES AND FAMILY HISTORY:  Not known at this time and he cannot give a  history.   REVIEW OF SYSTEMS:  He cannot give a history.   PHYSICAL EXAMINATION:  GENERAL:  He is lethargic, noncommunicative, does  move all extremities.  VITAL SIGNS:  Blood pressure 110/72, heart rate of 92, temperature 98.3.  HEENT:  Disconjugate gaze with slight lateral deviation of the left eye.  Right facial palsy.  NECK:  Unremarkable.  CARDIOVASCULAR:  Regular rhythm, grade 2/6 holosystolic heard best at the  apex.  PMI is 10. __________.  He has no significant edema.  Pulses are  2+/4+.  No bruits are noted.  No lifts, heaves or thrills.  LUNGS:  Decreased breath sounds but no rales, rhonchi or wheezes.  ABDOMEN:  Positive bowel sounds, soft, nontender.  NEUROLOGIC:  Cranial nerves:  He has a right central seventh.  Previous  disconjugate gaze as mentioned above and no doll's eyes.  Positive gag.  Motor:  He moves all extremities to stimulation.  Deep tendon reflexes are  3+/4+ in the upper and lower extremities.  Toes downgoing.   UPEP showed a monoclonal spike in addition to the laboratories above.  PTH  483.  It was an intact assay.  PSA 0.7.   CT showed no focal  findings.   ASSESSMENT:  1. Acute renal failure secondary to hypercalcemia primary toxic effect and     volume depletion, cannot rule out nonsteroidal or sulfa component but     that is less likely.  He cannot concentrate his urine because of the     calcium effect on the collecting tubule and the damage of the ADH effect.     That exacerbates his ability to retain free water and his sodium rises.     This also is in the setting of getting high-volume sodium administration.  2. Increased serum sodium due to the inability to concentrate his urine and     high-volume normal saline.  3. Hypokalemia due to high-volume urine with sodium administration.  4. Increased calcium, primary hyperparathyroidism is most likely but need to     rule out malignancy.  Doubt this is secondary hyperparathyroidism as     increased calcium is rare, especially to this level.  With chronicity and     the stones, suspect primary hyperparathyroidism is most likely.  Need to     rule out an [facility] with his gastrointestinal bleeding.  5. History of coronary artery disease.  6. History of increased lipids.  7. Focal neurologic signs.  Suspect he has small vessel disease.   PLAN:  1. IV fluids with D5 half-normal saline.  2. IV Kay Ciel.  3. IV sodium phosphate.  4. NG for nutrition and phosphate repletion.  5. Sensipar to stimulate his calcium sensor and receptor.  6. Sestamibi scan.  7. Bone scan.  8. Urinalysis.   Plan as above.   ADDENDUM TO DATA:  His right kidney is 11.8 and the left is 11 cm.  No  hydronephrosis, some increased cortical echogenicity, and several stones in  the bladder.                                               James L. Deterding, [name]    JLD/MEDQ  D:  07/19/2004  T:  07/19/2004  Job:  [id number]

## 2011-04-27 NOTE — Consult Note (Signed)
NAME:  [name], [name]                        ACCOUNT NO.:  [id number]   MEDICAL RECORD NO.:  [id number]                   PATIENT TYPE:  INP   LOCATION:  5003                                 FACILITY:  [facility]   PHYSICIAN:  Sharolyn Douglas, [name]                     DATE OF BIRTH:  27-Jan-1931   DATE OF CONSULTATION:  06/27/2003  DATE OF DISCHARGE:                                   CONSULTATION   REQUESTING PHYSICIAN:  Ermalinda Memos. Gaye Pollack, [name]   CHIEF COMPLAINT:  Bilateral ankle pain, left greater than right.   HISTORY:  The patient is a 77 year old male who was seen at the [facility]  Emergency Room on June 25, 2003 for bilateral ankle fractures.  Apparently,  another orthopedic physician was consulted by telephone and the patient was  discharged from the emergency room with a cam walker on the left ankle and  an ASO brace on the right ankle.  He had instructions to follow up in the  orthopedist's office.  Because of intractable pain, he presented back to the  emergency room and was admitted to the hospital by Dr. Gaye Pollack.  Inpatient  orthopedic consultation was then obtained.  The patient is a 77 year old  male who is relatively active.  He was [place] the steps [place] the  clubhouse after playing golf and fell, twisting the left ankle and catching  the right.  He felt a crack and was unable to ambulate.  As mentioned above,  he was treated at the [facility] Emergency Room and discharged home.  He was  admitted to the hospital, placed on a PCA pump.  He is currently feeling  much better.  He did not strike his head.  He has no numbness or tingling.  No other complaints.   PAST MEDICAL HISTORY:  1. Coronary artery disease, status post coronary artery bypass.  2. Hypertension.  3. Compensated congestive heart failure.  4. History of tobacco abuse.   PAST SURGICAL HISTORY:  1. Coronary artery bypass grafting.  2. Penile implant.  3. TURP.  4. Lithotripsy.   MEDICATIONS:  1.  Plavix.  2. Lopressor.  3. Altace.  4. Lasix.  5. K-Dur.   FAMILY HISTORY:  Positive for coronary artery disease.   SOCIAL HISTORY:  He is married.  He is a retired [employer].  He used to  smoke one pack of cigarettes per week for 20 years.  He quit about 20 years  ago.   ALLERGIES:  None.   PHYSICAL EXAMINATION:  GENERAL:  He appears in good general health and  physical condition.  He is afebrile.  His vital signs are stable.  He is  resting in bed comfortable.  ABDOMEN:  He has abdominal obesity.  EXTREMITIES:  Examination of the lower extremities shows that he is  neurovascularly intact.  He is able to wiggle the toes.  He has good  sensation and weak but palpable pulses.  There is moderate swelling about  the left ankle both medially and laterally.  He is tender over the deltoid  ligament, as well as over the lateral malleolus.  On the right ankle, he is  tender directly over the medial malleolus.  The skin is intact.   LABORATORY DATA:  Radiographs were reviewed.  There has been no significant  change from those x-rays taken on July 16.  The left ankle has a displaced  lateral malleolus fracture with widening of the medial clear space.  The  right ankle has a minimally displaced evulsion fracture of the medial  malleolus.   IMPRESSION:  1. Displaced left bimalleolar equivalent ankle fracture.  2. Minimally displaced right medial malleolus evulsion fracture.  3. History of coronary artery disease.   PLAN:  I reviewed the situation with the patient and his wife in detail.  He  has a displaced left bimalleolar equivalent ankle fracture.  We discussed  the relative pros and cons of open reduction and internal fixation versus  cast treatment.  I explained to the patient that there are risks involved  with surgery including bleeding, infection, hardware failure, nonunion,  posttraumatic arthritis, wound complications, and of course the medical risk  involved with surgery  including myocardial infarction, stroke, pulmonary  embolus, and other imponderables.  I do think he is a surgical candidate,  considering the bimalleolar equivalent nature of the fracture and the  widening of the medial clear space.  Surgery would allow anatomic  restoration of the ankle mortis and decrease his chance of posttraumatic  arthritis and ankle pain.  Of course, even with surgery there are no  guarantees that he will not develop arthritis and chronic pain.  He  understands that casting would be an acceptable means of treatment  especially if he is going to place limited demands on the ankle or if  medical issues make the risk of surgery prohibitive.  At this point, the  patient wishes to move ahead with surgery.  We will plan this for tomorrow.  He will need to be cleared for surgery by the medical service.                                               Sharolyn Douglas, [name]    MC/MEDQ  D:  06/27/2003  T:  06/28/2003  Job:  [id number]   cc:   Ermalinda Memos. Gaye Pollack, [name]  Fax: [phone]

## 2011-04-27 NOTE — Op Note (Signed)
   NAME:  [name], [name]                        ACCOUNT NO.:  [id number]   MEDICAL RECORD NO.:  [id number]                   PATIENT TYPE:  AMB   LOCATION:  ENDO                                 FACILITY:  [facility]   PHYSICIAN:  John C. Madilyn Fireman, [name]                 DATE OF BIRTH:  16-May-1931   DATE OF PROCEDURE:  11/09/2002  DATE OF DISCHARGE:                                 OPERATIVE REPORT   PROCEDURE:  Colonoscopy with polypectomy.   INDICATIONS FOR PROCEDURE:  Rectal bleeding in a 77 year old patient with no  prior colon screening.   DESCRIPTION OF PROCEDURE:  The patient was placed in the left lateral  decubitus position and placed on the pulse monitor with continuous low-flow  oxygen delivered by nasal cannula.  He was sedated with 100 mg of IV Demerol  and 8 mg of IV Versed.  The Olympus videocolonoscope was inserted into the  rectum and advanced to the cecum, confirmed by transillumination of  McBurney's point and visualization of the ileocecal valve and appendiceal  orifice.  The prep was good.  The cecum appeared normal.  Within the  ascending colon, there was seen a small, round, sessile polyp which was  fulgurated by hot biopsy.  A larger, more oblong polyp was attempted to be  addressed with the snare, but it could not be ensnared this way, and it was  also fulgurated by hot biopsy.  It was approximately 0.5 x 1 cm in diameter.  Within the transverse colon, there was seen a 10cm polyp which was removed  by snare.  A similar polyp was seen in the descending colon.  Both of these  polyps were sent in separate specimen containers from the ones that were  found in the ascending colon.  There were a few sigmoid diverticula and no  other abnormalities in the sigmoid colon.  The rectum appeared normal on  retroflexed view.  The anus revealed no obvious internal hemorrhoids.  The  colonoscope was then withdrawn, and the patient returned to the recovery  room in stable condition.   He tolerated the procedure well, and there were  no immediate complications.   IMPRESSION:  Ascending, transverse, and descending colon polyps.   PLAN:  Await biopsy results to determine method and interval for future  colon screening.                                               John C. Madilyn Fireman, [name]    JCH/MEDQ  D:  11/09/2002  T:  11/09/2002  Job:  [id number]   cc:   Duncan Dull, [name]  [address]  [place]  [place] [address]  Fax: [phone]

## 2011-04-27 NOTE — Op Note (Signed)
NAME:  [name], [name] NO.:  [id number]   MEDICAL RECORD NO.:  [id number]          PATIENT TYPE:  OIB   LOCATION:  2899                         FACILITY:  [facility]   PHYSICIAN:  Adolph Pollack, M.D.DATE OF BIRTH:  12/15/1930   DATE OF PROCEDURE:  10/12/2004  DATE OF DISCHARGE:                                 OPERATIVE REPORT   PREOPERATIVE DIAGNOSIS:  Primary hyperparathyroidism.   POSTOPERATIVE DIAGNOSIS:  Primary hyperparathyroidism.   OPERATION PERFORMED:  Minimally invasive dissection of parathyroid adenoma  (right inferior).   SURGEON:  Adolph Pollack, [name]   ASSISTANT:  Velora Heckler, MD   ANESTHESIA:  General.   INDICATIONS FOR PROCEDURE:  This 77 year old male has been diagnosed with  primary hyperparathyroidism.  Sestamibi scan demonstrates an enlarged what  appears to be a right inferior parathyroid gland.  He now presents for  elective parathyroidectomy.  He has been injected at 8:15 with technetium  sestamibi.   DESCRIPTION OF PROCEDURE:  He was seen in the holding area and brought to  the operating room and placed supine on the operating table and general  anesthetic was administered.  I used the NeoProbe to isolate increased  counts in the right inferior neck midline region. The counts were  approximately 1060 with background being about 400.  The neck was then  sterilely prepped and draped.  A transverse incision was made beginning at  the midline and staying a little bit to the right one fingerbreadth above  the clavicle through the skin and subcutaneous tissue and platysma muscle.  The sternocleidomastoid muscle was retracted laterally.  The strap muscles  were identified.  I then split the strap muscles with the cautery exposing  the thyroid gland.  I used careful blunt dissection to retract the strap  muscles away and protect the thyroid.  Using the NeoProbe I could see the  areas of increased counts and saw what appeared to be  parathyroid tissue.  Using blunt dissection, I identified an enlarged right inferior parathyroid  gland.  It was directly inferior to the inferior thyroid artery.  The  anterior surface was friable.  However, there were definitely planes between  the gland and the parathyroid gland that was enlarged and the thyroid gland.  I identified the inferior vascular supply and clipped it and divided it.  I  then identified the recurrent laryngeal nerve and kept it posterior to the  plane of dissection.  I subsequently used some blunt dissection to elevate  the gland.  I found its medial blood supply, clipped this and divided it.  The posterior and lateral parts of the gland actually stayed fairly well  intact within the capsule.  The gland was not infiltrating into local  tissues and it easily came away from the thyroid gland.  I removed all of  the parathyroid gland.  I then sent it to pathology and this was confirmed  that it was a parathyroid adenoma.  No definite malignancy was seen on the  frozen section.  I subsequently irrigated out the wound and inspected it for  bleeding and hemostasis  appeared to be adequate.  I placed some Surgicel at  the site.  I then closed the strap muscles with interrupted 3-0 Vicryl  suture.  Platysma muscle was closed with interrupted 3-0  sutures and the skin closed with 4-0 Monocryl subcuticular stitch.  Steri-  Strips and sterile dressing were applied.  The patient tolerated the  procedure well without any apparent complications and was taken to the  recovery room in satisfactory condition.       TJR/MEDQ  D:  10/12/2004  T:  10/12/2004  Job:  [id number]   cc:   Schuyler Amor, [name]  [address]  [place], [place] [address]  Fax: [phone]   Tera Mater. Evlyn Kanner, [name]  [address]  [place]  [place] [address]  Fax: [phone]   Llana Aliment. Deterding, [name]  [address]  [place]  [place] [address]  Fax: [phone]   Eduardo Osier. Sharyn Lull, [name]  [address]  [place]  [place] [address]  Fax: [phone]

## 2011-04-27 NOTE — Op Note (Signed)
NAME:  [name], [name] NO.:  [id number]   MEDICAL RECORD NO.:  [id number]                   PATIENT TYPE:  AMB   LOCATION:  NESC                                 FACILITY:  [facility]   PHYSICIAN:  Rozanna Boer., [name]      DATE OF BIRTH:  Jan 13, 1931   DATE OF PROCEDURE:  01/13/2003  DATE OF DISCHARGE:                                 OPERATIVE REPORT   PREOPERATIVE DIAGNOSIS:  Large, faintly opacified, right renal calculus with  hematuria and flank pain.   POSTOPERATIVE DIAGNOSIS:  Large, faintly opacified, right renal calculus  with hematuria and flank pain.   OPERATION:  Cystoretrograde pyelogram and right ureteroscopy with homium  laser tripsy and insertion of stent.   ANESTHESIA:  General.   SURGEON:  Courtney Paris, [name]   BRIEF HISTORY:  This 77 year old patient is admitted with hematuria and  intermittent right flank pain.  He had some filling defects in his right  renal pelvis and lower pole calyces.  A previous right lithotripsy with dye  was done March 2003 for calcium oxalate stone.  He also has a T2C Gleason  3+3 adenocarcinoma of the prostate, recently diagnosed.  He had a cardiac  stent April 2000 and a penile implant in 1994 and a TURP in 1994.  Another  lithotripsy was done November 1999.   PROCEDURE:  The patient was placed on the operating table in the dorsal  lithotomy position after satisfactory induction of general anesthesia, was  prepped and draped with Betadine in the usual sterile fashion.  With his  penile prosthesis it was just barely long enough for the scope to be passed  through the urethra, no strictures seen, into the bladder.  The right  orifice was visualized and a retrograde pyelogram was done which  demonstrated filling defects in the right kidney renal pelvis.  A guidewire  was then passed up under fluoroscopy as the scope was then removed and over  the guidewire a long ureteral sheath was passed  under fluoroscopy, and the  guidewire was then removed leaving the sheath in place.  Through the sheath  a long, flexible ureteroscope was passed up into the kidney.  He had several  large yellowish stones present in the renal pelvis in the lower pole  calyces.  With the homium laser these were broken into tiny pieces.  The  ones that were the most difficult to reach due to the angulation of the  scope were in the lower pole but seeing that all the large pieces were  broken up into tiny gravel with the scope.  Hemostasis was good.  The scope  and sheath were then removed after replacing the guidewire and over the  guidewire a 6 French x 28-cm length double-J ureteral stent was passed and  the guidewire was removed.  The stent was in good position.  The bladder  drained.  The scope was removed.  The patient was  taken to the recovery room  in good condition.  He was given some Toradol and B&O suppositories for  postoperative pain relief and he will be discharged as an outpatient.  Will  leave the stent for at least a week and remove it in the office as an  outpatient.                                               Rozanna Boer., [name]    HMK/MEDQ  D:  01/13/2003  T:  01/13/2003  Job:  [id number]

## 2011-04-27 NOTE — Cardiovascular Report (Signed)
[place]. [facility]  Patient:    [name], [name]                     MRN: [id number] Proc. Date: 01/09/01 Adm. Date:  [id number] Disc. Date: [id number] Attending:  Robynn Pane CC:         Cardiac Catheterization Lab   Cardiac Catheterization  PROCEDURES: 1. Left cardiac catheterization. 2. Selective left and right coronary angiography. 3. Visualization of saphenous vein graft. 4. Visualization of LIMA graft to LAD. 5. Visualization of RIMA graft to RCA. 6. Left ventriculography via right groin using Judkins technique.  INDICATIONS FOR THE PROCEDURE:  Mr. [name] is a 77 year old black male with a past medical history significant for coronary artery disease, status post anteroseptal wall MI in January 2000; had emergency PTCA and stenting to LAD.  Status post CABG in April 2000 due to accessory stenosis.  History of CHF, with complaint of precordial chest pain radiating to the neck, associated with nausea and diaphoresis off and on for the last two weeks.  The patient also gives history of exertional dyspnea and chest pain associated with feeling weak and tired.  Denies any PND, orthopnea, leg swelling.  Denies palpation, light-headedness or syncope.  PAST MEDICAL HISTORY:  As above.  PAST SURGICAL HISTORY:  He had coronary artery bypass grafting in April 2000. He had LIMA to LAD, LIMA to RCA and saphenous vein graft to the first diagonal.  He had also penile implant in 1984.  SOCIAL HISTORY:  Married.  Retired; worked as a Naval architect.  Smoked one pack/week for 20 years.  Quit 20+ years ago.  No history of alcohol abuse.  FAMILY HISTORY:  Father died of accidental death.  Mother died at the age of 53 due to MI.  One brother died of MI at the age of 108.  One sister had kidney failure.  ALLERGIES:  ANTIHISTAMINES.  MEDICATIONS: 1. Altace 5 mg p.o. q.d. 2. Metoprolol 50 mg 1/2 tablet p.o. b.i.d. 3. Lasix 80 mg p.o. q.d. 4.  Enteric-coated aspirin one tablet p.o. q.d. 5. Nitro-Dur 0.2 mg/hr q.d.  PHYSICAL EXAMINATION:  GENERAL:  The patient is awake, alert and oriented x 3.  No acute distress.  VITAL SIGNS:  Blood pressure 130/80, pulse 58 regular.  HEENT:  Conjuctivae pink.  NECK:  Supple, no JVD and no bruits.  LUNGS:  Clear to auscultation, without rhonchi or rales.  CARDIOVASCULAR:  S1/S2 was normal.  There was no S3 gallop.  ABDOMEN:  Soft.  Bowel sounds are present.  Nontender.  EXTREMITIES:  There is no clubbing, cyanosis, or edema.  IMPRESSION:  Explorative angina, coronary artery disease, status post anteroseptal wall MI, status post CABG, congestive heart failure, hypertension morbid obesity.  INDICATIONS:  Discussed with the patient and his wife various options of treatment (i.e., noninvasive stress testing, invasive measure of catheterization including its risks and benefits to include death, MI, stroke, need for emergency bypass, CABG, and blood complications.  The patient consented for the procedure.  DESCRIPTION OF PROCEDURE:  After obtaining the informed consent, the patient was brought to the catheterization lab and was placed on the fluoroscopy table.  The right groin was prepped and draped in the usual fashion. Xylocaine 2% was used for local anesthesia in the right groin.  With the help of a thin-walled needle, a 6-French arterial sheath was placed.  The sheath was aspirated and flushed.  Next, a 6-French left Judkins catheter was advanced  over the wire under fluoroscopic guidance up to the ascending aorta.  The wire was pulled out, the catheter was aspirated and connected to the manifold.  The catheter was further advanced and engaged into the left coronary ostium.  Multiple views of the left system were taken.  Next, the catheter was disengaged and was pulled out over the wire and was replaced with 6-French right Judkins catheter.  This was advanced over the wire under  fluoroscopic guidance up to the ascending aorta.  The wire was pulled out, the catheter was aspirated and connected to the manifold.  The catheter was further advanced and engaged into the right coronary ostium. Multiple views of the right system were taken.  Next, the catheter was disengaged and was engaged into the saphenous vein graft to the first diagonal.  Multiple views of this graft were taken.  Next, the catheter was disengaged and was pulled out over the wire and was replaced with LIMA catheter, which was advanced over the wire under fluoroscopic guidance up to ascending aorta.  Then was subsequently engaged into IMA.  A single view of the IMA graft was taken.  Next, the catheter was disengaged and was engaged into RIMA to RCA.  Multiple views of this graft were taken.  Next, this IMA catheter was pulled out over the wire and was replaced with a 6-French pigtail catheter; which was advanced over the wire under fluoroscopic guidance up to the ascending aorta.  The wire was pulled out, the catheter was aspirated and connected to the manifold.  The catheter was further advanced across the aortic valve into the LV.  Left ventricular pressures were recorded.  Next, left ventriculography was done in 30-degree RAO position. Post-angiographic pressures were recorded from the LV, and then pullback pressures were recorded from the aorta.  There was no gradient across the aortic valve.  Next, the pigtail catheter was pulled out over the wire.  The sheaths were aspirated and flushed.  FINDINGS:  The patient has global hypokinesia, with anterolateral wall MI, mild dyskinesia; EF of 35-50%.  CORONARY ANGIOGRAPHY: 1. LEFT MAIN:  Patent. 2. LEFT ANTERIOR DESCENDING ARTERY:  Had 60% ostial stenosis, and then was    100% occluded from thereon in the proximal portion. 3. LEFT CIRCUMFLEX:  Large, and which was patent proximally and then tapers    down in the AV groove after giving off  OM1.  OM1 was large and patent.  4. RIGHT CORONARY ARTERY:  Has 50-60% proximal stenosis and 20-30% mid    stenosis. 5. GRAFTS:    a. LIMA to LAD is patent.    b. Saphenous vein graft to first diagonal has 20-30% ostial stenosis.    c. RIMA to RCA is 100% occluded.  DISPOSITION:  The patient tolerated the procedure well, there were no complications.  PLAN:  To continue with present medications for now, with thallium stress test Cardiolite as an outpatient to evaluate the significance of proximal RCA lesion. DD:  01/09/01 TD:  01/09/01 Job: [id number]

## 2011-04-27 NOTE — Discharge Summary (Signed)
[place]. [facility]  Patient:    [name], [name]                     MRN: [id number] Adm. Date:  [id number] Disc. Date: [id number] Attending:  Robynn Pane                           Discharge Summary  REFERRING PHYSICIAN:  Eduardo Osier. Sharyn Lull, [name]  DISCHARGE DIAGNOSES: 1. Angina. 2. Coronary artery disease. 3. Status post coronary artery bypass graft surgery. 4. Cervical sprain.  DISCHARGE MEDICATIONS: 1. Vioxx 25 mg one p.o. q.d. 2. Altace 2.5 mg one p.o. q.d. 3. Metoprolol 25 mg one b.i.d. 4. Flomax 0.4 mg one q.d. 5. Nitroglycerin tablets 0.4 mg one under the tongue q.5 minutes. x 3 p.r.n.    chest pain. 6. Lasix 80 mg one p.o. q.d.  DISCHARGE INSTRUCTIONS:  Diet:  Low-fat low-salt diet as tolerated.  Activity: As tolerated.  The patient to avoid any lifting, pulling, or pushing until evaluated for his neck.  FOLLOWUP:  Follow-up by Dr. Sharyn Lull in one week and by orthopedic neurosurgery as per primary care physician.  CONDITION ON DISCHARGE:  Stable.  HISTORY OF PRESENT ILLNESS:  This 77 year old black male complains of substernal chest pain, neck pain, and left arm pain with a history of CABG in April 2000.  The patient had some improvement in his chest pain with nitroglycerin use.  Denied any shortness of breath or sweating spell.  PAST MEDICAL HISTORY:  Negative for diabetes, hypertension, smoking, and alcohol intake.  Positive for elevated cholesterol, myocardial infarction, and obesity.  PHYSICAL EXAMINATION:  VITAL SIGNS:  Temperature 97, pulse 70, respirations 18, blood pressure 129/84, height 6 feet 2 inches, weight 270 pounds.  GENERAL:  The patient was alert and oriented x 3.  HEENT:  Head: Normocephalic, atraumatic with frontal baldness.  Eyes:  Manson Passey with pupils equal and reactive to light.  Extraocular movement intact. Conjunctivae pink, sclerae nonicteric.  Ears, nose, and throat revealed mucous membranes pink and  moist.  NECK:  No JVD, no carotid bruit.  CHEST:  Lungs clear bilaterally.  Chest wall nontender.  CARDIOVASCULAR:  Normal S1 and S2.  ABDOMEN:  Soft and nontender.  EXTREMITIES:  No edema, cyanosis, or clubbing except for mild swelling of the left ankle.  NEUROLOGIC:  CNS:  Cranial nerves II-XII grossly intact, and the patient had bilateral muscle strength.  LABORATORY AND X-RAY DATA:  Normal hemoglobin, hematocrit, WBC, and platelet count.  Normal electrolytes, BUN, and creatinine.  Normal CK and MB.  Normal PT and PTT.  EKG:  Sinus rhythm with nonspecific ST-T changes.  Adenosine Cardiolite stress test negative for any reversible ischemia, positive for _______.  HOSPITAL COURSE:  The patient was admitted to telemetry unit.  Myocardial infarction was ruled out.  He underwent adenosine Cardiolite stress test which failed to show any reversible ischemia.  The patients condition remained stable over 24-hour hospitalization.  He had no additional chest discomfort with ambulation, and his IV nitroglycerin was discontinued, and he was discharged home in satisfactory condition with advice to start Vioxx 25 mg q.d., cut down on his activity until evaluated by his primary care physician for a possible cervical sprain. DD:  10/21/00 TD:  10/21/00 Job: [id number]

## 2011-04-27 NOTE — H&P (Signed)
NAME:  [name], PREMO NO.:  [id number]   MEDICAL RECORD NO.:  [id number]          PATIENT TYPE:  OIB   LOCATION:  2899                         FACILITY:  [facility]   PHYSICIAN:  Adolph Pollack, M.D.DATE OF BIRTH:  08-27-1931   DATE OF ADMISSION:  10/12/2004  DATE OF DISCHARGE:                                HISTORY & PHYSICAL   REASON FOR ADMISSION:  Elective parathyroidectomy.   HISTORY OF PRESENT ILLNESS:  This 77 year old male has been recently  diagnosed with primary hyperparathyroidism.  He had presented to the  emergency department at [facility] back in early August with  severely-altered mental status and acute renal, malnutrition, and a calcium  of 14.9.  Parathyroid hormone level at that time was 483.  Workup for  hypercalcemia ensued, findings consistent with primary hyperparathyroidism,  and a sestamibi scan demonstrating a large right parathyroid gland that was  possibly endothyroidal.  He recovered from his overall disease and now  presents electively for a minimally-invasive resection of parathyroid gland.   PAST MEDICAL HISTORY:  1.  Coronary artery disease.  2.  Hypertension.  3.  Hyperlipidemia.  4.  Nephrolithiasis.  5.  Prostate cancer.  6.  Acute renal failure.  7.  Hypercalcemia.  8.  Primary hyperparathyroidism.   PREVIOUS OPERATIONS:  1.  Coronary artery bypass grafting.  2.  Prostatectomy.  3.  ORIF of left ankle fracture.  4.  Cystoscopy.  5.  Penile implant.   CURRENT MEDICATIONS:  Phos-NaK, Prilosec, Toprol-XL; Ecotrin and Plavix both  on hold.   SOCIAL HISTORY:  Married.  Smokes a pack of cigarettes a day.  No current  alcohol use.   REVIEW OF SYSTEMS:  Remarkable for some intermittent headaches.  Also has  occasional difficulty starting the urinary stream.   PHYSICAL EXAMINATION:  GENERAL:  A well-developed, well-nourished male, no  acute distress.  VITAL SIGNS:  Temperature is 98, heart rate 59, blood  pressure 137/90.  HEENT:  Eyes:  Extraocular motion is intact, no icterus.  NECK:  Supple without palpable masses or obvious thyroid enlargement.  RESPIRATORY:  Breath sounds equal and clear, respirations unlabored.  CARDIOVASCULAR:  Heart demonstrates a slightly decreased rate with a regular  rhythm.  No lower extremity edema, no JVD.  ABDOMEN:  Soft, is nontender, nondistended.   IMPRESSION:  Primary hyperparathyroidism, most likely secondary to a right  inferior parathyroid adenoma.   PLAN:  Parathyroidectomy (MIRP).  Procedure and risks were discussed with  him preoperatively.       TJR/MEDQ  D:  10/12/2004  T:  10/12/2004  Job:  [id number]   cc:   Eduardo Osier. Sharyn Lull, [name]  [address]  [place]  [place] [address]  Fax: [phone]   Schuyler Amor, [name]  [address]  [place], [place] [address]  Fax: [phone]   Tera Mater. Evlyn Kanner, [name]  [address]  [place]  [place] [address]  Fax: [phone]   Llana Aliment. Deterding, [name]  [address]  [place]  [place] [address]  Fax: [phone]

## 2011-04-27 NOTE — Cardiovascular Report (Signed)
NAME:  [name], [name] NO.:  [id number]   MEDICAL RECORD NO.:  [id number]          PATIENT TYPE:  OIB   LOCATION:  1965                         FACILITY:  [facility]   PHYSICIAN:  Mohan N. Sharyn Lull, [name] DATE OF BIRTH:  11/19/1931   DATE OF PROCEDURE:  02/04/2007  DATE OF DISCHARGE:                            CARDIAC CATHETERIZATION   PROCEDURE:  Left cardiac cath with selective left and right coronary  angiography, left ventriculography, visualization of saphenous vein  graft and LIMA graft.  Left ventriculography via right groin using  Judkins technique.   INDICATIONS FOR PROCEDURE:  [name] is a 77 year old black male with  past medical history significant for coronary artery disease, history of  anteroseptal wall MI in January 2000, status post PCI to LAD and then  subsequently had CABG in April 2000, history of congestive heart  failure, hypertension, hypercholesteremia, history of parathyroid  adenoma resection, complains of retrosternal chest tightness off and on  lasting a few minutes, resolved with rest.  Denies any nausea, vomiting,  diaphoresis.  Denies palpitation, lightheadedness or syncope.  Denies  PND, orthopnea, leg swelling.  The patient underwent Persantine Myoview  on January 28, 2007, which showed an equivocal area of reversible  ischemia in the anterior aspects and apical and mid segment of the  anterior septum with EF of 51%.  Due to typical anginal chest pain, very  mildly abnormal Persantine Myoview, I discussed with the patient and his  wife regarding various options of treatment, i.e., medical versus left  cath, possible PCI its risks and benefits, i.e., death, MI, stroke, need  for emergency CABG, risk of restenosis, local vascular complications,  etc. and consented for the procedure.   PROCEDURE:  After obtaining informed consent, the patient was brought to  the cath lab and was placed on fluoroscopy table.  The right groin was  prepped and draped in usual fashion.  2% Xylocaine was used for local  anesthesia in the right groin.  With the help of thin-wall needle, 4-  French arterial sheath was placed.  The sheath was aspirated and  flushed.  Next, 4-French left Judkins catheter was advanced over the  wire under fluoroscopic guidance up to the ascending aorta.  Wire was  pulled out, the catheter was aspirated and connected to the manifold.  Catheter was further advanced and engaged into left coronary ostium.  Multiple views of the left system were taken.  Next, the catheter was  disengaged and was pulled out over the wire and was replaced with 4-  [place] right Judkins catheter which was advanced over the wire under  fluoroscopic guidance up to the ascending aorta.  Wire was pulled out,  the catheter was aspirated and connected to the manifold.  Catheter was  further advanced and engaged into right coronary ostium.  Multiple views  of the right system were taken.  Next, the catheter was disengaged and  was engaged into saphenous vein graft to diagonal one.  Multiple views  of this graft were taken.  Next, catheter was disengaged and was  advanced over the [facility] wire up to the  left subclavian artery.  This  catheter was exchanged over the wire with LIMA diagnostic catheter which  was advanced over the wire under fluoroscopic guidance up to the left  subclavian.  This catheter was engaged into LIMA to LAD with some  difficulty.  A single view of LIMA to LAD was taken known.  Next, this  LIMA catheter was pulled out over the wire.  Sheaths were aspirated and  flushed.  Next, the 6-French pigtail catheter was advanced over the wire  under fluoroscopic guidance up to the ascending aorta.  Catheter was  further advanced across the aortic valve into LV.  LV pressures were  recorded.  Next, left ventriculograph was done in 30 degree RAO  position.  Post angiographic pressures were recorded from LV and then  pullback  pressures were recorded from aorta.  There was no gradient  across the aortic valve.  Next, the pigtail catheter was pulled out over  the wire.  Sheaths were aspirated and flushed.   FINDINGS:  LV showed good LV systolic function, EF of 55-60%.  Left main  was patent.  LAD has 30-40% ostial stenosis and then was 100% occluded  beyond proximal portion.  Diagonal one was very, very small, left  circumflex has 20-30% proximal stenosis.  OM-1 was very, very small.  OM-  2 has 20-30% ostial stenosis which is large vessel.  OM-3 and 4 were  small, which were patent.  RCA has 50-60% proximal and mid sequential  stenosis.  PDA is patent.  Posterolateral branches are small which are  patent.  Saphenous vein graft to diagonal one has 20-30% ostial and  proximal stenosis.  LIMA to RCA was 100% occluded as per prior  angiogram. LIMA to LAD was patent.  Distal LAD was diffusely diseased.  The patient tolerated the procedure well.  There were no complications.  The patient was transferred to the recovery room in stable condition.           ______________________________  Eduardo Osier Sharyn Lull, [name]     MNH/MEDQ  D:  02/04/2007  T:  02/04/2007  Job:  [id number]   cc:   Cath Lab

## 2011-06-14 ENCOUNTER — Emergency Department (HOSPITAL_COMMUNITY)
Admission: EM | Admit: 2011-06-14 | Discharge: 2011-06-14 | Disposition: A | Discharge status: Home / Self Care | Payer: Medicare Other | Attending: Emergency Medicine | Admitting: Emergency Medicine

## 2011-06-14 DIAGNOSIS — Q5569 Other congenital malformation of penis: Secondary | ICD-10-CM | POA: Insufficient documentation

## 2011-06-14 DIAGNOSIS — R319 Hematuria, unspecified: Secondary | ICD-10-CM | POA: Insufficient documentation

## 2011-06-14 DIAGNOSIS — N390 Urinary tract infection, site not specified: Secondary | ICD-10-CM | POA: Insufficient documentation

## 2011-06-14 DIAGNOSIS — I10 Essential (primary) hypertension: Secondary | ICD-10-CM | POA: Insufficient documentation

## 2011-06-14 DIAGNOSIS — Z9889 Other specified postprocedural states: Secondary | ICD-10-CM | POA: Insufficient documentation

## 2011-06-14 LAB — URINE MICROSCOPIC-ADD ON

## 2011-06-14 LAB — URINALYSIS, ROUTINE W REFLEX MICROSCOPIC
Nitrite: POSITIVE — AB
Specific Gravity, Urine: 1.024 (ref 1.005–1.030)
pH: 5 (ref 5.0–8.0)

## 2011-06-15 LAB — URINE CULTURE

## 2011-08-31 LAB — CBC
HCT: 38.5 — ABNORMAL LOW
MCV: 85.5
MCV: 86.7
Platelets: 174
RBC: 4.44
WBC: 4.9
WBC: 5.9

## 2011-08-31 LAB — DIFFERENTIAL
Basophils Absolute: 0
Eosinophils Relative: 4
Lymphocytes Relative: 20
Lymphs Abs: 1.2
Monocytes Absolute: 0.7

## 2011-08-31 LAB — COMPREHENSIVE METABOLIC PANEL
AST: 33
Albumin: 3.5
Chloride: 106
Creatinine, Ser: 1.02
GFR calc Af Amer: >60
Total Bilirubin: 0.8

## 2011-08-31 LAB — BASIC METABOLIC PANEL
BUN: 9
CO2: 24
Chloride: 107
Chloride: 110
GFR calc Af Amer: >60
Glucose, Bld: 108 — ABNORMAL HIGH
Potassium: 3.6
Potassium: 4

## 2011-08-31 LAB — CLOSTRIDIUM DIFFICILE EIA

## 2011-08-31 LAB — URINE MICROSCOPIC-ADD ON

## 2011-08-31 LAB — URINALYSIS, ROUTINE W REFLEX MICROSCOPIC
Bilirubin Urine: NEGATIVE
Glucose, UA: NEGATIVE
Hgb urine dipstick: NEGATIVE
Nitrite: NEGATIVE
Specific Gravity, Urine: 1.022
pH: 5.5

## 2011-09-05 LAB — DIFFERENTIAL
Eosinophils Absolute: 0.1
Lymphocytes Relative: 16
Lymphs Abs: 1.3
Monocytes Relative: 6
Neutrophils Relative %: 76

## 2011-09-05 LAB — URINALYSIS, ROUTINE W REFLEX MICROSCOPIC
Bilirubin Urine: NEGATIVE
Glucose, UA: NEGATIVE
Ketones, ur: NEGATIVE
Specific Gravity, Urine: 1.016
pH: 6.5

## 2011-09-05 LAB — COMPREHENSIVE METABOLIC PANEL
ALT: 18
AST: 26
Alkaline Phosphatase: 61
CO2: 25
Calcium: 8.6
Chloride: 108
GFR calc Af Amer: >60
GFR calc non Af Amer: >60
Glucose, Bld: 97
Potassium: 4.8
Sodium: 140
Total Bilirubin: 1

## 2011-09-05 LAB — URINE MICROSCOPIC-ADD ON

## 2011-09-05 LAB — APTT: aPTT: 31

## 2011-09-05 LAB — CBC
MCV: 86.2
RBC: 5.09
WBC: 7.7

## 2011-09-07 LAB — URINE CULTURE
Colony Count: NO GROWTH
Culture: NO GROWTH

## 2011-09-07 LAB — BASIC METABOLIC PANEL
BUN: 8
CO2: 23
Calcium: 9
Glucose, Bld: 94
Potassium: 4.2
Sodium: 139

## 2011-09-07 LAB — DIFFERENTIAL
Basophils Absolute: 0
Basophils Relative: 0
Eosinophils Relative: 5
Monocytes Absolute: 0.4

## 2011-09-07 LAB — URINALYSIS, ROUTINE W REFLEX MICROSCOPIC
Bilirubin Urine: NEGATIVE
Glucose, UA: NEGATIVE
Hgb urine dipstick: NEGATIVE
Ketones, ur: NEGATIVE
Nitrite: NEGATIVE
Specific Gravity, Urine: 1.02
pH: 6

## 2011-09-07 LAB — CBC
HCT: 40.4
Hemoglobin: 13.6
MCHC: 33.6
Platelets: 178
RDW: 15.6 — ABNORMAL HIGH

## 2011-09-07 LAB — URINE MICROSCOPIC-ADD ON

## 2011-10-27 ENCOUNTER — Encounter (HOSPITAL_COMMUNITY): Payer: Self-pay | Admitting: *Deleted

## 2011-10-27 ENCOUNTER — Emergency Department (INDEPENDENT_AMBULATORY_CARE_PROVIDER_SITE_OTHER)
Admission: EM | Admit: 2011-10-27 | Discharge: 2011-10-27 | Disposition: A | Discharge status: Home / Self Care | Payer: Medicare Other | Source: Home / Self Care | Attending: Family Medicine | Admitting: Family Medicine

## 2011-10-27 DIAGNOSIS — K029 Dental caries, unspecified: Secondary | ICD-10-CM

## 2011-10-27 DIAGNOSIS — K089 Disorder of teeth and supporting structures, unspecified: Secondary | ICD-10-CM

## 2011-10-27 DIAGNOSIS — K0889 Other specified disorders of teeth and supporting structures: Secondary | ICD-10-CM

## 2011-10-27 MED ORDER — HYDROCODONE-ACETAMINOPHEN 7.5-325 MG PO TABS: 1.0000 | ORAL_TABLET | Freq: Three times a day (TID) | ORAL | Status: AC | PRN

## 2011-10-27 MED ORDER — AMOXICILLIN 500 MG PO CAPS: 500.0000 mg | ORAL_CAPSULE | Freq: Three times a day (TID) | ORAL | Status: AC

## 2011-10-27 NOTE — ED Provider Notes (Signed)
History     CSN: [id number] Arrival date & time: 10/27/2011  2:30 PM   First MD Initiated Contact with Patient 10/27/11 1345      Chief Complaint  Patient presents with  . Dental Pain    (Consider location/radiation/quality/duration/timing/severity/associated sxs/prior treatment) Patient is a 77 y.o. male presenting with tooth pain. The history is provided by the patient.  Dental PainThe primary symptoms include mouth pain. The symptoms began 12 to 24 hours ago. The symptoms are worsening. The symptoms occur constantly.  Additional symptoms include: dental sensitivity to temperature, gum tenderness, jaw pain and facial swelling. Medical issues include: periodontal disease.    Past Medical History  Diagnosis Date  . Prostate ca   . CAD (coronary artery disease)   . Hypertension     Past Surgical History  Procedure Date  . Coronary artery bypass graft   . Prostate surgery     History reviewed. No pertinent family history.  History  Substance Use Topics  . Smoking status: Never Smoker   . Smokeless tobacco: Not on file  . Alcohol Use: No      Review of Systems  HENT: Positive for facial swelling.   Musculoskeletal: Positive for back pain.    Allergies  Review of patient's allergies indicates no known allergies.  Home Medications   Current Outpatient Rx  Name Route Sig Dispense Refill  . ASPIRIN 81 MG PO TABS Oral Take 81 mg by mouth daily.      Marland Kitchen METOPROLOL TARTRATE PO Oral Take by mouth.      . BENICAR PO Oral Take by mouth.      . CRESTOR PO Oral Take by mouth.      . AMOXICILLIN 500 MG PO CAPS Oral Take 1 capsule (500 mg total) by mouth 3 (three) times daily. 24 capsule 0  . HYDROCODONE-ACETAMINOPHEN 7.5-325 MG PO TABS Oral Take 1 tablet by mouth every 8 (eight) hours as needed for pain. 20 tablet 0    Pulse 56  Temp(Src) 97.7 F (36.5 C) (Oral)  Resp 19  SpO2 95%  Physical Exam  Constitutional:       OBM  HENT:  Head: Normocephalic.  Right  Ear: Hearing, tympanic membrane, external ear and ear canal normal.  Left Ear: Hearing, tympanic membrane, external ear and ear canal normal.  Mouth/Throat: He does not have dentures. No oral lesions. Abnormal dentition. Dental caries present. No dental abscesses or lacerations. No oropharyngeal exudate.    Neurological: He is alert.    ED Course  Procedures (including critical care time) Patient was having deleterious before trouble with his teeth for toxoplasma mom on amoxicillin and then pain medication. He sees oxycodone before the Percocet for his back and kidney stones and felt that this pain is that severe it today to want that still placed on some Vicodin may go to 7.5 feet 325 one by mouth Q668 hours when necessary basis we'll also add amoxicillin 875 one tablet daily for the next 10 days stressed importance of following up the dentist. Labs Reviewed - No data to display No results found.   1. Dental caries   2. Pain, dental       MDM          Hassan Rowan, MD 10/27/11 [phone]

## 2011-10-27 NOTE — ED Notes (Signed)
Co pain in right lower molar x 2 days

## 2011-11-06 ENCOUNTER — Encounter (HOSPITAL_COMMUNITY): Payer: Self-pay | Admitting: *Deleted

## 2011-11-06 ENCOUNTER — Emergency Department (HOSPITAL_COMMUNITY): Payer: No Typology Code available for payment source

## 2011-11-06 ENCOUNTER — Emergency Department (HOSPITAL_COMMUNITY)
Admission: EM | Admit: 2011-11-06 | Discharge: 2011-11-06 | Disposition: A | Discharge status: Home / Self Care | Payer: No Typology Code available for payment source | Attending: Emergency Medicine | Admitting: Emergency Medicine

## 2011-11-06 DIAGNOSIS — Y92410 Unspecified street and highway as the place of occurrence of the external cause: Secondary | ICD-10-CM | POA: Insufficient documentation

## 2011-11-06 DIAGNOSIS — M545 Low back pain, unspecified: Secondary | ICD-10-CM | POA: Insufficient documentation

## 2011-11-06 DIAGNOSIS — M549 Dorsalgia, unspecified: Secondary | ICD-10-CM | POA: Insufficient documentation

## 2011-11-06 DIAGNOSIS — M542 Cervicalgia: Secondary | ICD-10-CM | POA: Insufficient documentation

## 2011-11-06 DIAGNOSIS — Z8546 Personal history of malignant neoplasm of prostate: Secondary | ICD-10-CM | POA: Insufficient documentation

## 2011-11-06 DIAGNOSIS — I25810 Atherosclerosis of coronary artery bypass graft(s) without angina pectoris: Secondary | ICD-10-CM | POA: Insufficient documentation

## 2011-11-06 DIAGNOSIS — I10 Essential (primary) hypertension: Secondary | ICD-10-CM | POA: Insufficient documentation

## 2011-11-06 DIAGNOSIS — S139XXA Sprain of joints and ligaments of unspecified parts of neck, initial encounter: Secondary | ICD-10-CM | POA: Insufficient documentation

## 2011-11-06 MED ORDER — HYDROCODONE-ACETAMINOPHEN 5-325 MG PO TABS: 1.0000 | ORAL_TABLET | ORAL | Status: AC | PRN

## 2011-11-06 MED ORDER — CYCLOBENZAPRINE HCL 10 MG PO TABS: 10.0000 mg | ORAL_TABLET | Freq: Three times a day (TID) | ORAL | Status: AC | PRN
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
## 2011-11-06 NOTE — ED Notes (Signed)
Pt left with 2 prescriptions and ambulated to d/c window.

## 2011-11-16 ENCOUNTER — Other Ambulatory Visit: Payer: Self-pay | Admitting: Cardiology

## 2012-06-25 ENCOUNTER — Other Ambulatory Visit (HOSPITAL_COMMUNITY): Payer: Self-pay | Admitting: Urology

## 2012-06-25 DIAGNOSIS — C61 Malignant neoplasm of prostate: Secondary | ICD-10-CM

## 2012-06-27 ENCOUNTER — Encounter (HOSPITAL_COMMUNITY)
Admission: RE | Admit: 2012-06-27 | Discharge: 2012-06-27 | Disposition: A | Discharge status: Home / Self Care | Payer: Medicare Other | Source: Ambulatory Visit | Attending: Urology | Admitting: Urology

## 2012-06-27 ENCOUNTER — Ambulatory Visit (HOSPITAL_COMMUNITY)
Admission: RE | Admit: 2012-06-27 | Discharge: 2012-06-27 | Disposition: A | Discharge status: Home / Self Care | Payer: Medicare Other | Source: Ambulatory Visit | Attending: Urology | Admitting: Urology

## 2012-06-27 DIAGNOSIS — C61 Malignant neoplasm of prostate: Secondary | ICD-10-CM

## 2012-06-27 DIAGNOSIS — M545 Low back pain, unspecified: Secondary | ICD-10-CM | POA: Insufficient documentation

## 2012-06-27 MED ORDER — TECHNETIUM TC 99M MEDRONATE IV KIT
25.0000 | PACK | Freq: Once | INTRAVENOUS | Status: AC | PRN
  Administered 2012-06-27: 25 via INTRAVENOUS

## 2012-08-02 ENCOUNTER — Emergency Department (HOSPITAL_COMMUNITY): Payer: Medicare Other

## 2012-08-02 ENCOUNTER — Emergency Department (HOSPITAL_COMMUNITY)
Admission: EM | Admit: 2012-08-02 | Discharge: 2012-08-02 | Disposition: A | Discharge status: Home / Self Care | Payer: Medicare Other | Attending: Emergency Medicine | Admitting: Emergency Medicine

## 2012-08-02 ENCOUNTER — Encounter (HOSPITAL_COMMUNITY): Payer: Self-pay | Admitting: *Deleted

## 2012-08-02 DIAGNOSIS — Z79899 Other long term (current) drug therapy: Secondary | ICD-10-CM | POA: Insufficient documentation

## 2012-08-02 DIAGNOSIS — R319 Hematuria, unspecified: Secondary | ICD-10-CM

## 2012-08-02 DIAGNOSIS — Z8546 Personal history of malignant neoplasm of prostate: Secondary | ICD-10-CM | POA: Insufficient documentation

## 2012-08-02 DIAGNOSIS — I2510 Atherosclerotic heart disease of native coronary artery without angina pectoris: Secondary | ICD-10-CM | POA: Insufficient documentation

## 2012-08-02 DIAGNOSIS — Z7982 Long term (current) use of aspirin: Secondary | ICD-10-CM | POA: Insufficient documentation

## 2012-08-02 DIAGNOSIS — I10 Essential (primary) hypertension: Secondary | ICD-10-CM | POA: Insufficient documentation

## 2012-08-02 DIAGNOSIS — N200 Calculus of kidney: Secondary | ICD-10-CM

## 2012-08-02 DIAGNOSIS — Z951 Presence of aortocoronary bypass graft: Secondary | ICD-10-CM | POA: Insufficient documentation

## 2012-08-02 LAB — URINALYSIS, ROUTINE W REFLEX MICROSCOPIC
Nitrite: NEGATIVE
Protein, ur: 100 mg/dL — AB
Urobilinogen, UA: 1 mg/dL (ref 0.0–1.0)

## 2012-08-02 LAB — COMPREHENSIVE METABOLIC PANEL
Albumin: 3.6 g/dL (ref 3.5–5.2)
Alkaline Phosphatase: 85 U/L (ref 39–117)
BUN: 20 mg/dL (ref 6–23)
Potassium: 4.4 mEq/L (ref 3.5–5.1)
Sodium: 143 mEq/L (ref 135–145)
Total Protein: 7.3 g/dL (ref 6.0–8.3)

## 2012-08-02 LAB — CBC WITH DIFFERENTIAL/PLATELET
Basophils Absolute: 0 10*3/uL (ref 0.0–0.1)
Basophils Relative: 0 % (ref 0–1)
Eosinophils Absolute: 0.5 10*3/uL (ref 0.0–0.7)
MCH: 29.5 pg (ref 26.0–34.0)
MCHC: 33.8 g/dL (ref 30.0–36.0)
Monocytes Relative: 7 % (ref 3–12)
Neutrophils Relative %: 64 % (ref 43–77)
Platelets: 200 10*3/uL (ref 150–400)
RDW: 15.1 % (ref 11.5–15.5)

## 2012-08-02 LAB — URINE MICROSCOPIC-ADD ON

## 2012-08-02 MED ORDER — HYDROMORPHONE HCL PF 1 MG/ML IJ SOLN
1.0000 mg | Freq: Once | INTRAMUSCULAR | Status: AC
  Administered 2012-08-02: 1 mg via INTRAVENOUS
  Filled 2012-08-02: qty 1

## 2012-08-02 MED ORDER — DOCUSATE SODIUM 100 MG PO CAPS: 100.0000 mg | ORAL_CAPSULE | Freq: Two times a day (BID) | ORAL | Status: AC | PRN

## 2012-08-02 MED ORDER — CIPROFLOXACIN HCL 500 MG PO TABS: 500.0000 mg | ORAL_TABLET | Freq: Two times a day (BID) | ORAL | Status: AC

## 2012-08-02 MED ORDER — ONDANSETRON 8 MG PO TBDP: 8.0000 mg | ORAL_TABLET | Freq: Two times a day (BID) | ORAL | Status: AC | PRN

## 2012-08-02 MED ORDER — SODIUM CHLORIDE 0.9 % IV BOLUS (SEPSIS)
1000.0000 mL | Freq: Once | INTRAVENOUS | Status: AC
  Administered 2012-08-02: 1000 mL via INTRAVENOUS

## 2012-08-02 MED ORDER — HYDROCODONE-ACETAMINOPHEN 5-325 MG PO TABS: ORAL_TABLET | ORAL | Status: AC

## 2012-08-02 MED ORDER — ONDANSETRON HCL 4 MG/2ML IJ SOLN
4.0000 mg | Freq: Once | INTRAMUSCULAR | Status: AC
  Administered 2012-08-02: 4 mg via INTRAVENOUS
  Filled 2012-08-02: qty 2

## 2012-08-02 NOTE — Discharge Instructions (Signed)
Kidney Stones Kidney stones (ureteral lithiasis) are deposits that form inside your kidneys. The intense pain is caused by the stone moving through the urinary tract. When the stone moves, the ureter goes into spasm around the stone. The stone is usually passed in the urine.  CAUSES   A disorder that makes certain neck glands produce too much parathyroid hormone (primary hyperparathyroidism).   A buildup of uric acid crystals.   Narrowing (stricture) of the ureter.   A kidney obstruction present at birth (congenital obstruction).   Previous surgery on the kidney or ureters.   Numerous kidney infections.  SYMPTOMS   Feeling sick to your stomach (nauseous).   Throwing up (vomiting).   Blood in the urine (hematuria).   Pain that usually spreads (radiates) to the groin.   Frequency or urgency of urination.  DIAGNOSIS   Taking a history and physical exam.   Blood or urine tests.   Computerized X-ray scan (CT scan).   Occasionally, an examination of the inside of the urinary bladder (cystoscopy) is performed.  TREATMENT   Observation.   Increasing your fluid intake.   Surgery may be needed if you have severe pain or persistent obstruction.  The size, location, and chemical composition are all important variables that will determine the proper choice of action for you. Talk to your caregiver to better understand your situation so that you will minimize the risk of injury to yourself and your kidney.  HOME CARE INSTRUCTIONS   Drink enough water and fluids to keep your urine clear or pale yellow.   Strain all urine through the provided strainer. Keep all particulate matter and stones for your caregiver to see. The stone causing the pain may be as small as a grain of salt. It is very important to use the strainer each and every time you pass your urine. The collection of your stone will allow your caregiver to analyze it and verify that a stone has actually passed.   Only take  over-the-counter or prescription medicines for pain, discomfort, or fever as directed by your caregiver.   Make a follow-up appointment with your caregiver as directed.   Get follow-up X-rays if required. The absence of pain does not always mean that the stone has passed. It may have only stopped moving. If the urine remains completely obstructed, it can cause loss of kidney function or even complete destruction of the kidney. It is your responsibility to make sure X-rays and follow-ups are completed. Ultrasounds of the kidney can show blockages and the status of the kidney. Ultrasounds are not associated with any radiation and can be performed easily in a matter of minutes.  SEEK IMMEDIATE MEDICAL CARE IF:   Pain cannot be controlled with the prescribed medicine.   You have a fever.   The severity or intensity of pain increases over 18 hours and is not relieved by pain medicine.   You develop a new onset of abdominal pain.   You feel faint or pass out.  MAKE SURE YOU:   Understand these instructions.   Will watch your condition.   Will get help right away if you are not doing well or get worse.  Document Released: 11/26/2005 Document Revised: 11/15/2011 Document Reviewed: 03/24/2010 [employer] Patient Information 2012 [employer].    Narcotic and benzodiazepine use may cause drowsiness, slowed breathing or dependence.  Please use with caution and do not drive, operate machinery or watch young children alone while taking them.  Taking combinations of these   medications or drinking alcohol will potentiate these effects.    

## 2012-08-02 NOTE — ED Notes (Signed)
Discharge instructions reviewed w/ pt and wife, both verbalize understanding. Four prescriptions provided at discharge. To f/u w/ urologist on Monday

## 2012-08-02 NOTE — ED Provider Notes (Signed)
History     CSN: [id number]  Arrival date & time 08/02/12  1321   First MD Initiated Contact with Patient 08/02/12 1353      Chief Complaint  Patient presents with  . Nephrolithiasis    (Consider location/radiation/quality/duration/timing/severity/associated sxs/prior treatment) HPI Comments: Pt with h/o multiple kidney stones over the years, developed similar colic pain on right side in right flank that went to back 3 days ago, has moved to right groin, but not resolving.  No N/V, but pain worsening so finally came to the ED.  No fevers, chills, radiates to groin, but denies rash or actual testicle hurting.  Some pain when he tries to urinate however.  No obv hematuria.  Reports he still has appendix.  Pt sees Dr. Mena Goes with [facility] and has h/o prostate cancer.  Recently PSA has climbed and is getting monthly IM injections for prostate.  Doesn't recall name.    The history is provided by the patient, the spouse and medical records.    Past Medical History  Diagnosis Date  . Prostate ca   . CAD (coronary artery disease)   . Hypertension     Past Surgical History  Procedure Date  . Coronary artery bypass graft   . Prostate surgery   . Penile prosthesis implant     History reviewed. No pertinent family history.  History  Substance Use Topics  . Smoking status: Never Smoker   . Smokeless tobacco: Not on file  . Alcohol Use: No      Review of Systems  Constitutional: Negative for fever and chills.  Cardiovascular: Negative for chest pain.  Gastrointestinal: Positive for abdominal pain. Negative for nausea, vomiting, diarrhea and constipation.  Genitourinary: Positive for flank pain. Negative for urgency, frequency, scrotal swelling and penile pain.  Musculoskeletal: Negative for back pain.  All other systems reviewed and are negative.    Allergies  Review of patient's allergies indicates no known allergies.  Home Medications   Current Outpatient Rx    Name Route Sig Dispense Refill  . ALOE VERA 25 MG PO CAPS Oral Take 2 capsules by mouth every evening.    . ASPIRIN 81 MG PO TABS Oral Take 81 mg by mouth daily.      Marland Kitchen FINASTERIDE 5 MG PO TABS Oral Take 5 mg by mouth daily.      Marland Kitchen KRILL OIL 1000 MG PO CAPS Oral Take 1,000 mg by mouth daily.      Marland Kitchen METOPROLOL SUCCINATE ER 50 MG PO TB24 Oral Take 50 mg by mouth daily.     Marland Kitchen BENICAR PO Oral Take 40 mg by mouth daily.     . CRESTOR PO Oral Take 20 mg by mouth daily.     Marland Kitchen CIPROFLOXACIN HCL 500 MG PO TABS Oral Take 1 tablet (500 mg total) by mouth 2 (two) times daily. 10 tablet 0  . DOCUSATE SODIUM 100 MG PO CAPS Oral Take 1 capsule (100 mg total) by mouth 2 (two) times daily as needed for constipation. 60 capsule 0  . HYDROCODONE-ACETAMINOPHEN 5-325 MG PO TABS  1-2 tablets po q 6 hours prn moderate to severe pain 20 tablet 0  . ONDANSETRON 8 MG PO TBDP Oral Take 1 tablet (8 mg total) by mouth every 12 (twelve) hours as needed for nausea. 20 tablet 0    BP 159/88  Pulse 67  Temp 98.5 F (36.9 C) (Oral)  Resp 24  SpO2 98%  Physical Exam  Vitals reviewed. Constitutional: He  is oriented to person, place, and time. He appears well-developed and well-nourished.  HENT:  Head: Normocephalic and atraumatic.  Eyes: EOM are normal. Pupils are equal, round, and reactive to light. No scleral icterus.  Neck: Normal range of motion. Neck supple.  Cardiovascular: Normal rate and regular rhythm.   Pulmonary/Chest: Effort normal. No respiratory distress. He has no wheezes.  Abdominal: Soft. He exhibits no distension. There is no hepatosplenomegaly. There is tenderness in the right lower quadrant and suprapubic area. There is no rebound, no CVA tenderness and negative Murphy's sign.    Genitourinary: Right testis shows no tenderness. Left testis shows no tenderness.  Musculoskeletal: He exhibits no edema and no tenderness.  Neurological: He is alert and oriented to person, place, and time.  Skin:  Skin is warm and dry. No rash noted.  Psychiatric: He has a normal mood and affect.    ED Course  Procedures (including critical care time)  Labs Reviewed  URINALYSIS, ROUTINE W REFLEX MICROSCOPIC - Abnormal; Notable for the following:    Color, Urine RED (*)  BIOCHEMICALS MAY BE AFFECTED BY COLOR   APPearance TURBID (*)     Hgb urine dipstick LARGE (*)     Bilirubin Urine MODERATE (*)     Ketones, ur TRACE (*)     Protein, ur 100 (*)     Leukocytes, UA MODERATE (*)     All other components within normal limits  CBC WITH DIFFERENTIAL - Abnormal; Notable for the following:    Eosinophils Relative 7 (*)     All other components within normal limits  COMPREHENSIVE METABOLIC PANEL - Abnormal; Notable for the following:    Glucose, Bld 107 (*)     Creatinine, Ser 1.39 (*)     GFR calc non Af Amer 47 (*)     GFR calc Af Amer 54 (*)     All other components within normal limits  URINE MICROSCOPIC-ADD ON  URINE CULTURE   Ct Abdomen Pelvis Wo Contrast  08/02/2012  *RADIOLOGY REPORT*  Clinical Data: 78 year old male with abdominal and pelvic pain as well as hematuria and history of urinary calculi.  CT ABDOMEN AND PELVIS WITHOUT CONTRAST  Technique:  Multidetector CT imaging of the abdomen and pelvis was performed following the standard protocol without intravenous contrast.  Comparison: 04/12/2011  Findings: Scarring/fibrosis in the lung bases again noted.  The liver, spleen, pancreas, gallbladder and adrenal glands are unremarkable.  A 9 mm nonobstructing calculus in the right lower pole and a 2 mm nonobstructing calculus within the left lower pole are noted. There is no evidence of hydronephrosis or obstructing urinary calculi. Mild bilateral renal atrophy again noted.  Please note that parenchymal abnormalities may be missed as intravenous contrast was not administered.  No free fluid, enlarged lymph nodes, biliary dilation or abdominal aortic aneurysm identified.  Descending and sigmoid  colonic diverticulosis noted without evidence of diverticulitis. The appendix and bladder are unremarkable.  A penile implant, prostate seeds and small left inguinal hernia containing fat again noted.  No acute or suspicious bony abnormalities are identified. Degenerative changes throughout the lumbar spine again noted.  IMPRESSION: No evidence of acute abnormality.  Nonobstructing bilateral renal calculi.   Original Report Authenticated By: Rosendo Gros, [name]    3:23 PM Pt feels improved.  Abd is soft.  CT shows lower pole 9 mm stone on right, but pt;s symptoms are RLQ.  No other acute findings to explain.  Pt again denies any testicular pain or  scrotal pain now.  I have urged close outpt urology follow up.  Gross hematuria is noted.  Will add urine culture.  Will let Urologist know of pt's condition and need for urgent follow up.    1. Kidney stone   2. Hematuria     RA sat is 98% and I interpret to be normal.    MDM  Pt with prior h/o stones in the past, feels similar.  However no N/V here.  Pt is tender in RLQ.  Will consider CT scan given age and still has appendix.  Will get UA as well to check for infection or hematuria.  Had abd CT in 5/12 that was unremarkable.  No aortic dilatation or aneurysm at that time.          Gavin Pound. Cedrick Partain, MD 08/02/12 1544

## 2012-08-02 NOTE — ED Notes (Signed)
Patient has history of kidney stones and he thinks he is experiencing same again.  Patient c/o right side groin pain and thinks the stone is moving

## 2012-08-04 LAB — URINE CULTURE: Colony Count: 8000

## 2012-08-16 ENCOUNTER — Emergency Department (HOSPITAL_COMMUNITY)
Admission: EM | Admit: 2012-08-16 | Discharge: 2012-08-16 | Disposition: A | Discharge status: Home / Self Care | Payer: Medicare Other | Attending: Emergency Medicine | Admitting: Emergency Medicine

## 2012-08-16 ENCOUNTER — Encounter (HOSPITAL_COMMUNITY): Payer: Self-pay | Admitting: Emergency Medicine

## 2012-08-16 DIAGNOSIS — Z79899 Other long term (current) drug therapy: Secondary | ICD-10-CM | POA: Insufficient documentation

## 2012-08-16 DIAGNOSIS — N390 Urinary tract infection, site not specified: Secondary | ICD-10-CM | POA: Insufficient documentation

## 2012-08-16 DIAGNOSIS — Z7982 Long term (current) use of aspirin: Secondary | ICD-10-CM | POA: Insufficient documentation

## 2012-08-16 DIAGNOSIS — M545 Low back pain, unspecified: Secondary | ICD-10-CM | POA: Insufficient documentation

## 2012-08-16 DIAGNOSIS — Z951 Presence of aortocoronary bypass graft: Secondary | ICD-10-CM | POA: Insufficient documentation

## 2012-08-16 DIAGNOSIS — Z8546 Personal history of malignant neoplasm of prostate: Secondary | ICD-10-CM | POA: Insufficient documentation

## 2012-08-16 DIAGNOSIS — I10 Essential (primary) hypertension: Secondary | ICD-10-CM | POA: Insufficient documentation

## 2012-08-16 DIAGNOSIS — I2510 Atherosclerotic heart disease of native coronary artery without angina pectoris: Secondary | ICD-10-CM | POA: Insufficient documentation

## 2012-08-16 HISTORY — DX: Calculus of kidney: N20.0

## 2012-08-16 LAB — URINE MICROSCOPIC-ADD ON

## 2012-08-16 LAB — COMPREHENSIVE METABOLIC PANEL
AST: 21 U/L (ref 0–37)
Albumin: 3.5 g/dL (ref 3.5–5.2)
BUN: 18 mg/dL (ref 6–23)
Chloride: 110 mEq/L (ref 96–112)
Creatinine, Ser: 1.09 mg/dL (ref 0.50–1.35)
Total Bilirubin: 0.3 mg/dL (ref 0.3–1.2)
Total Protein: 6.8 g/dL (ref 6.0–8.3)

## 2012-08-16 LAB — URINALYSIS, ROUTINE W REFLEX MICROSCOPIC
Glucose, UA: NEGATIVE mg/dL
Hgb urine dipstick: NEGATIVE
Specific Gravity, Urine: 1.028 (ref 1.005–1.030)
pH: 5.5 (ref 5.0–8.0)

## 2012-08-16 LAB — CBC WITH DIFFERENTIAL/PLATELET
Basophils Absolute: 0 10*3/uL (ref 0.0–0.1)
Basophils Relative: 0 % (ref 0–1)
Eosinophils Absolute: 0.5 10*3/uL (ref 0.0–0.7)
MCH: 29.2 pg (ref 26.0–34.0)
MCHC: 33.5 g/dL (ref 30.0–36.0)
Monocytes Absolute: 0.5 10*3/uL (ref 0.1–1.0)
Monocytes Relative: 8 % (ref 3–12)
Neutro Abs: 4.3 10*3/uL (ref 1.7–7.7)
Neutrophils Relative %: 63 % (ref 43–77)
RDW: 15.1 % (ref 11.5–15.5)

## 2012-08-16 MED ORDER — SODIUM CHLORIDE 0.9 % IV BOLUS (SEPSIS)
1000.0000 mL | Freq: Once | INTRAVENOUS | Status: AC
  Administered 2012-08-16: 1000 mL via INTRAVENOUS

## 2012-08-16 MED ORDER — FENTANYL CITRATE 0.05 MG/ML IJ SOLN
50.0000 ug | Freq: Once | INTRAMUSCULAR | Status: AC
  Administered 2012-08-16: 50 ug via INTRAVENOUS

## 2012-08-16 MED ORDER — ONDANSETRON HCL 4 MG/2ML IJ SOLN
4.0000 mg | Freq: Once | INTRAMUSCULAR | Status: AC
  Administered 2012-08-16: 4 mg via INTRAVENOUS
  Filled 2012-08-16: qty 2

## 2012-08-16 MED ORDER — CEPHALEXIN 500 MG PO CAPS: ORAL_CAPSULE | ORAL | Status: AC

## 2012-08-16 MED ORDER — OXYCODONE-ACETAMINOPHEN 5-325 MG PO TABS: 2.0000 | ORAL_TABLET | ORAL | Status: AC | PRN

## 2012-08-16 MED ORDER — FENTANYL CITRATE 0.05 MG/ML IJ SOLN
50.0000 ug | Freq: Once | INTRAMUSCULAR | Status: AC
  Administered 2012-08-16: 100 ug via INTRAVENOUS
  Filled 2012-08-16: qty 2

## 2012-08-16 NOTE — ED Notes (Signed)
Pt presents w/ right flank pain, denies hematuria, dysuria. Nausea w/o emesis. Sx started yesterday afternoon. Pt w/ history of kidney stones.

## 2012-08-16 NOTE — ED Provider Notes (Signed)
History     CSN: [id number]  Arrival date & time 08/16/12  1204   First MD Initiated Contact with Patient 08/16/12 1251      Chief Complaint  Patient presents with  . Flank Pain    (Consider location/radiation/quality/duration/timing/severity/associated sxs/prior treatment) HPI This 78 year old male has a history of chronic low back pain as well as kidney stones in the past. He has received epidural spinal injections for his low back pain in the past. He was seen recently for some flank pain and had a CT scan that did not show any evidence of ureteral colic although has a known large right renal stone. He was treated for a UTI at that time. He states that yesterday he began having intermittent right paralumbar low back pain nonradiating and without associated symptoms except for nausea. The pain was not colicky in nature. It was much worse with movement and sharp stabbing pain. He had the pain off and on yesterday and often overnight and now today has constant pain all day today. It is a constant severe sharp nonradiating pain not associated symptoms except for nausea. He has no pain down his leg no weakness or numbness down his leg. He is no change in bowel or bladder function. He is no chest pain cough or shortness of breath. He does not have any flank pain or midline low back pain. Is not any fever or trauma. There is no treatment prior to arrival. Past Medical History  Diagnosis Date  . Prostate ca   . CAD (coronary artery disease)   . Hypertension   . Kidney stones    chronic low back pain  Past Surgical History  Procedure Date  . Coronary artery bypass graft   . Prostate surgery   . Penile prosthesis implant     No family history on file.  History  Substance Use Topics  . Smoking status: Never Smoker   . Smokeless tobacco: Not on file  . Alcohol Use: No      Review of Systems 10 Systems reviewed and are negative for acute change except as noted in the HPI. Allergies    Review of patient's allergies indicates no known allergies.  Home Medications   Current Outpatient Rx  Name Route Sig Dispense Refill  . ALOE VERA 25 MG PO CAPS Oral Take 2 capsules by mouth every evening.    . ASPIRIN 81 MG PO TABS Oral Take 81 mg by mouth daily.     Marland Kitchen KRILL OIL 1000 MG PO CAPS Oral Take 1,000 mg by mouth daily.      Marland Kitchen METOPROLOL SUCCINATE ER 50 MG PO TB24 Oral Take 50 mg by mouth daily.     Marland Kitchen BENICAR PO Oral Take 40 mg by mouth daily.     . CRESTOR PO Oral Take 20 mg by mouth every evening.     . CEPHALEXIN 500 MG PO CAPS  2 caps po bid x 7 days 28 capsule 0  . OXYCODONE-ACETAMINOPHEN 5-325 MG PO TABS Oral Take 2 tablets by mouth every 4 (four) hours as needed for pain. 20 tablet 0    BP 143/75  Pulse 48  Temp 97.4 F (36.3 C) (Oral)  Resp 18  SpO2 99%  Physical Exam  Nursing note and vitals reviewed. Constitutional:       Awake, alert, nontoxic appearance with baseline speech.  HENT:  Head: Atraumatic.  Eyes: Pupils are equal, round, and reactive to light. Right eye exhibits no discharge. Left eye  exhibits no discharge.  Neck: Neck supple.  Cardiovascular: Normal rate and regular rhythm.   No murmur heard. Pulmonary/Chest: Effort normal and breath sounds normal. No respiratory distress. He has no wheezes. He has no rales. He exhibits no tenderness.  Abdominal: Soft. Bowel sounds are normal. He exhibits no mass. There is no tenderness. There is no rebound.  Musculoskeletal:       Thoracic back: He exhibits no tenderness.       Lumbar back: He exhibits no tenderness.       Bilateral lower extremities non tender without new rashes or color change, baseline ROM with intact DP pulses, CR<2 secs all digits bilaterally, sensation baseline light touch bilaterally for pt, DTR's symmetric and intact bilaterally KJ / AJ, motor symmetric bilateral 5 / 5 hip flexion, quadriceps, hamstrings, EHL, foot dorsiflexion, foot plantarflexion. Back shows no midline tenderness  and no CVA tenderness he does have reproducible right lower paralumbar tenderness at pelvic brim.  Neurological:       Mental status baseline for patient.  Upper extremity motor strength and sensation intact and symmetric bilaterally.  Skin: No rash noted.  Psychiatric: He has a normal mood and affect.    ED Course  Procedures (including critical care time)  Labs Reviewed  CBC WITH DIFFERENTIAL - Abnormal; Notable for the following:    Eosinophils Relative 8 (*)     All other components within normal limits  COMPREHENSIVE METABOLIC PANEL - Abnormal; Notable for the following:    Glucose, Bld 126 (*)     GFR calc non Af Amer 63 (*)     GFR calc Af Amer 73 (*)     All other components within normal limits  URINALYSIS, ROUTINE W REFLEX MICROSCOPIC - Abnormal; Notable for the following:    Color, Urine AMBER (*)  BIOCHEMICALS MAY BE AFFECTED BY COLOR   Ketones, ur TRACE (*)     Leukocytes, UA SMALL (*)     All other components within normal limits  URINE MICROSCOPIC-ADD ON - Abnormal; Notable for the following:    Bacteria, UA MANY (*)     Casts HYALINE CASTS (*)     All other components within normal limits  URINE CULTURE   No results found.   1. Lumbar pain   2. UTI (lower urinary tract infection)       MDM  Pt feels improved after observation and/or treatment in ED.Patient / Family / Caregiver informed of clinical course, understand medical decision-making process, and agree with plan.  I doubt any other EMC precluding discharge at this time including, but not necessarily limited to the following:sepsis, radiculopathy.        Hurman Horn, MD 08/16/12 2129

## 2012-08-18 LAB — URINE CULTURE: Colony Count: 6000

## 2012-12-09 ENCOUNTER — Emergency Department (HOSPITAL_COMMUNITY)
Admission: EM | Admit: 2012-12-09 | Discharge: 2012-12-10 | Disposition: A | Discharge status: Home / Self Care | Payer: Medicare Other | Attending: Emergency Medicine | Admitting: Emergency Medicine

## 2012-12-09 ENCOUNTER — Encounter (HOSPITAL_COMMUNITY): Payer: Self-pay | Admitting: Emergency Medicine

## 2012-12-09 DIAGNOSIS — A088 Other specified intestinal infections: Secondary | ICD-10-CM | POA: Insufficient documentation

## 2012-12-09 DIAGNOSIS — Z8546 Personal history of malignant neoplasm of prostate: Secondary | ICD-10-CM | POA: Insufficient documentation

## 2012-12-09 DIAGNOSIS — R112 Nausea with vomiting, unspecified: Secondary | ICD-10-CM | POA: Insufficient documentation

## 2012-12-09 DIAGNOSIS — R51 Headache: Secondary | ICD-10-CM | POA: Insufficient documentation

## 2012-12-09 DIAGNOSIS — E669 Obesity, unspecified: Secondary | ICD-10-CM | POA: Insufficient documentation

## 2012-12-09 DIAGNOSIS — Z951 Presence of aortocoronary bypass graft: Secondary | ICD-10-CM | POA: Insufficient documentation

## 2012-12-09 DIAGNOSIS — I2510 Atherosclerotic heart disease of native coronary artery without angina pectoris: Secondary | ICD-10-CM | POA: Insufficient documentation

## 2012-12-09 DIAGNOSIS — Z79899 Other long term (current) drug therapy: Secondary | ICD-10-CM | POA: Insufficient documentation

## 2012-12-09 DIAGNOSIS — Z87442 Personal history of urinary calculi: Secondary | ICD-10-CM | POA: Insufficient documentation

## 2012-12-09 DIAGNOSIS — A084 Viral intestinal infection, unspecified: Secondary | ICD-10-CM

## 2012-12-09 DIAGNOSIS — I10 Essential (primary) hypertension: Secondary | ICD-10-CM | POA: Insufficient documentation

## 2012-12-09 LAB — COMPREHENSIVE METABOLIC PANEL
ALT: 15 U/L (ref 0–53)
Alkaline Phosphatase: 83 U/L (ref 39–117)
BUN: 18 mg/dL (ref 6–23)
CO2: 26 mEq/L (ref 19–32)
Calcium: 10.2 mg/dL (ref 8.4–10.5)
GFR calc Af Amer: 65 mL/min — ABNORMAL LOW (ref >90)
GFR calc non Af Amer: 56 mL/min — ABNORMAL LOW (ref >90)
Glucose, Bld: 90 mg/dL (ref 70–99)
Sodium: 142 mEq/L (ref 135–145)

## 2012-12-09 LAB — CBC WITH DIFFERENTIAL/PLATELET
Eosinophils Relative: 7 % — ABNORMAL HIGH (ref 0–5)
HCT: 38.9 % — ABNORMAL LOW (ref 39.0–52.0)
Hemoglobin: 12.9 g/dL — ABNORMAL LOW (ref 13.0–17.0)
Lymphocytes Relative: 25 % (ref 12–46)
Lymphs Abs: 1.9 10*3/uL (ref 0.7–4.0)
MCH: 29.5 pg (ref 26.0–34.0)
MCV: 89 fL (ref 78.0–100.0)
Monocytes Relative: 11 % (ref 3–12)
Platelets: 189 10*3/uL (ref 150–400)
RBC: 4.37 MIL/uL (ref 4.22–5.81)
WBC: 7.9 10*3/uL (ref 4.0–10.5)

## 2012-12-09 LAB — URINALYSIS, ROUTINE W REFLEX MICROSCOPIC
Bilirubin Urine: NEGATIVE
Ketones, ur: NEGATIVE mg/dL
Nitrite: NEGATIVE
Specific Gravity, Urine: 1.022 (ref 1.005–1.030)
Urobilinogen, UA: 1 mg/dL (ref 0.0–1.0)

## 2012-12-09 MED ORDER — IPRATROPIUM BROMIDE 0.02 % IN SOLN: 0.5000 mg | Freq: Once | RESPIRATORY_TRACT | Status: DC

## 2012-12-09 MED ORDER — ALBUTEROL SULFATE (5 MG/ML) 0.5% IN NEBU: 5.0000 mg | INHALATION_SOLUTION | Freq: Once | RESPIRATORY_TRACT | Status: DC

## 2012-12-09 MED ORDER — SODIUM CHLORIDE 0.9 % IV BOLUS (SEPSIS)
1000.0000 mL | Freq: Once | INTRAVENOUS | Status: AC
  Administered 2012-12-09: 1000 mL via INTRAVENOUS

## 2012-12-09 MED ORDER — ONDANSETRON HCL 8 MG PO TABS: 8.0000 mg | ORAL_TABLET | Freq: Three times a day (TID) | ORAL | Status: DC | PRN

## 2012-12-09 MED ORDER — IBUPROFEN 200 MG PO TABS
400.0000 mg | ORAL_TABLET | Freq: Once | ORAL | Status: AC
  Administered 2012-12-09: 400 mg via ORAL
  Filled 2012-12-09: qty 2

## 2012-12-09 MED ORDER — ONDANSETRON HCL 4 MG/2ML IJ SOLN
4.0000 mg | Freq: Once | INTRAMUSCULAR | Status: AC
  Administered 2012-12-09: 4 mg via INTRAVENOUS
  Filled 2012-12-09: qty 2
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
## 2012-12-09 NOTE — ED Notes (Signed)
Pt presents after onset of vomiting yesterday after eating an [employer] which he states he thinks was moldy. Has vomited today and had diarrhea which is just like water. Also c/o severe headache.

## 2012-12-09 NOTE — ED Provider Notes (Signed)
History     CSN: [id number]  Arrival date & time 12/09/12  1956   First MD Initiated Contact with Patient 12/09/12 2049      Chief Complaint  Patient presents with  . Emesis  . Diarrhea  . Headache    (Consider location/radiation/quality/duration/timing/severity/associated sxs/prior treatment) HPI History provided by pt.   Pt developed N/V/D yesterday afternoon.  Believes he ate a moldy apple fritter.  Associated w/ cramping in lower abdomen.  Denies fever, CP, SOB, cough, hematemesis/hematochezia/melena and urinary sx.  No known sick contacts.  No recent travel or new medications.  No h/o abdominal surgeries.  Past Medical History  Diagnosis Date  . CAD (coronary artery disease)   . Hypertension   . Kidney stones   . Prostate ca     Past Surgical History  Procedure Date  . Coronary artery bypass graft   . Prostate surgery   . Penile prosthesis implant   . Cardiac surgery     No family history on file.  History  Substance Use Topics  . Smoking status: Never Smoker   . Smokeless tobacco: Never Used  . Alcohol Use: No      Review of Systems  All other systems reviewed and are negative.    Allergies  Review of patient's allergies indicates no known allergies.  Home Medications   Current Outpatient Rx  Name  Route  Sig  Dispense  Refill  . OMEGA-3 FATTY ACIDS 1000 MG PO CAPS   Oral   Take 1 g by mouth daily.         Marland Kitchen METOPROLOL SUCCINATE ER 50 MG PO TB24   Oral   Take 50 mg by mouth daily. Take with or immediately following a meal.         . OLMESARTAN MEDOXOMIL 40 MG PO TABS   Oral   Take 40 mg by mouth daily.         Marland Kitchen OVER THE COUNTER MEDICATION      Kidney and liver vitamin.           BP 155/86  Pulse 74  Temp 98.9 F (37.2 C) (Oral)  Resp 20  SpO2 96%  Physical Exam  Nursing note and vitals reviewed. Constitutional: He is oriented to person, place, and time. He appears well-developed and well-nourished. No distress.    HENT:  Head: Normocephalic and atraumatic.  Mouth/Throat: Oropharynx is clear and moist.  Eyes:       Normal appearance  Neck: Normal range of motion.  Cardiovascular: Normal rate and regular rhythm.   Pulmonary/Chest: Effort normal and breath sounds normal. No respiratory distress.  Abdominal: Soft. Bowel sounds are normal. He exhibits no distension and no mass. There is no tenderness. There is no rebound and no guarding.       Obese.    Genitourinary:       No CVA tenderness  Musculoskeletal: Normal range of motion.  Neurological: He is alert and oriented to person, place, and time.  Skin: Skin is warm and dry. No rash noted.  Psychiatric: He has a normal mood and affect. His behavior is normal.    ED Course  Procedures (including critical care time)  Labs Reviewed  CBC WITH DIFFERENTIAL - Abnormal; Notable for the following:    Hemoglobin 12.9 (*)     HCT 38.9 (*)     Eosinophils Relative 7 (*)     All other components within normal limits  COMPREHENSIVE METABOLIC PANEL - Abnormal; Notable for  the following:    Albumin 3.4 (*)     GFR calc non Af Amer 56 (*)     GFR calc Af Amer 65 (*)     All other components within normal limits  URINALYSIS, ROUTINE W REFLEX MICROSCOPIC - Abnormal; Notable for the following:    Leukocytes, UA TRACE (*)     All other components within normal limits  URINE MICROSCOPIC-ADD ON   No results found.   1. Viral gastroenteritis       MDM  78yo M presents w/ N/V/D and lower abdominal cramping.  Also c/o slight frontal headache.  Afebrile, non-toxic appearing, well-hydrated and abdomen benign on exam.  Labs pending.  Pt receiving IVF, zofran and po ibuprofen.  Will reassess shortly.  10:04 PM   Pt reports feeling better.  No vomiting or diarrhea in ED.  Tolerating pos.   Labs unremarkable.  Abd benign on repeat exam.  Pt has a PCP to f/u with.  Prescribed zofran.  Return precautions discussed.      Otilio Miu,  PA-C 12/09/12 [phone]

## 2012-12-10 MED ORDER — ONDANSETRON HCL 8 MG PO TABS
8.0000 mg | ORAL_TABLET | Freq: Three times a day (TID) | ORAL | Status: DC | PRN
Start: 2012-12-10 — End: 2013-05-10

## 2012-12-10 NOTE — ED Notes (Signed)
Discharge instructions reviewed w/ pt., verbalizes understanding. No prescriptions provided at discharge. 

## 2012-12-10 NOTE — ED Provider Notes (Signed)
Medical screening examination/treatment/procedure(s) were conducted as a shared visit with non-physician practitioner(s) and myself.  I personally evaluated the patient during the encounter  Pt with symptoms most consistent with a viral process with vomitting/diarrhea.  Denies bad food exposure and recent travel out of the country.  No recent abx.  No hx concerning for GU pathology or kidney stones.  Pt is awake and alert on exam without peritoneal signs.    Gwyneth Sprout, MD 12/10/12 [phone]

## 2013-01-28 ENCOUNTER — Other Ambulatory Visit (HOSPITAL_COMMUNITY): Payer: Self-pay | Admitting: Cardiology

## 2013-01-28 DIAGNOSIS — R079 Chest pain, unspecified: Secondary | ICD-10-CM

## 2013-02-02 ENCOUNTER — Encounter (HOSPITAL_COMMUNITY)
Admission: RE | Admit: 2013-02-02 | Discharge: 2013-02-02 | Disposition: A | Discharge status: Home / Self Care | Payer: Medicare Other | Source: Ambulatory Visit | Attending: Cardiology | Admitting: Cardiology

## 2013-02-02 ENCOUNTER — Ambulatory Visit (HOSPITAL_COMMUNITY)
Admission: RE | Admit: 2013-02-02 | Discharge: 2013-02-02 | Disposition: A | Discharge status: Home / Self Care | Payer: Medicare Other | Source: Ambulatory Visit | Attending: Cardiology | Admitting: Cardiology

## 2013-02-02 ENCOUNTER — Other Ambulatory Visit (HOSPITAL_COMMUNITY): Payer: Self-pay | Admitting: Cardiology

## 2013-02-02 ENCOUNTER — Other Ambulatory Visit: Payer: Self-pay

## 2013-02-02 DIAGNOSIS — Z538 Procedure and treatment not carried out for other reasons: Secondary | ICD-10-CM | POA: Insufficient documentation

## 2013-02-02 DIAGNOSIS — I2510 Atherosclerotic heart disease of native coronary artery without angina pectoris: Secondary | ICD-10-CM

## 2013-02-02 DIAGNOSIS — R079 Chest pain, unspecified: Secondary | ICD-10-CM | POA: Insufficient documentation

## 2013-02-02 DIAGNOSIS — I252 Old myocardial infarction: Secondary | ICD-10-CM | POA: Insufficient documentation

## 2013-02-02 MED ORDER — REGADENOSON 0.4 MG/5ML IV SOLN
INTRAVENOUS | Status: AC
  Filled 2013-02-02: qty 5

## 2013-02-02 MED ORDER — TECHNETIUM TC 99M SESTAMIBI GENERIC - CARDIOLITE
10.0000 | Freq: Once | INTRAVENOUS | Status: AC | PRN
  Administered 2013-02-02: 10 via INTRAVENOUS

## 2013-02-02 MED ORDER — REGADENOSON 0.4 MG/5ML IV SOLN
0.4000 mg | Freq: Once | INTRAVENOUS | Status: AC
  Administered 2013-02-02: 0.4 mg via INTRAVENOUS

## 2013-02-02 MED ORDER — TECHNETIUM TC 99M SESTAMIBI GENERIC - CARDIOLITE
30.0000 | Freq: Once | INTRAVENOUS | Status: AC | PRN
  Administered 2013-02-02: 30 via INTRAVENOUS

## 2013-05-10 ENCOUNTER — Encounter (HOSPITAL_COMMUNITY): Payer: Self-pay | Admitting: *Deleted

## 2013-05-10 ENCOUNTER — Emergency Department (HOSPITAL_COMMUNITY)
Admission: EM | Admit: 2013-05-10 | Discharge: 2013-05-10 | Disposition: A | Discharge status: Home / Self Care | Payer: Medicare Other | Attending: Emergency Medicine | Admitting: Emergency Medicine

## 2013-05-10 ENCOUNTER — Emergency Department (HOSPITAL_COMMUNITY): Payer: Medicare Other

## 2013-05-10 DIAGNOSIS — I10 Essential (primary) hypertension: Secondary | ICD-10-CM | POA: Insufficient documentation

## 2013-05-10 DIAGNOSIS — I2510 Atherosclerotic heart disease of native coronary artery without angina pectoris: Secondary | ICD-10-CM | POA: Insufficient documentation

## 2013-05-10 DIAGNOSIS — R5381 Other malaise: Secondary | ICD-10-CM | POA: Insufficient documentation

## 2013-05-10 DIAGNOSIS — R0989 Other specified symptoms and signs involving the circulatory and respiratory systems: Secondary | ICD-10-CM | POA: Insufficient documentation

## 2013-05-10 DIAGNOSIS — Z7982 Long term (current) use of aspirin: Secondary | ICD-10-CM | POA: Insufficient documentation

## 2013-05-10 DIAGNOSIS — R42 Dizziness and giddiness: Secondary | ICD-10-CM

## 2013-05-10 DIAGNOSIS — R0600 Dyspnea, unspecified: Secondary | ICD-10-CM

## 2013-05-10 DIAGNOSIS — Z8546 Personal history of malignant neoplasm of prostate: Secondary | ICD-10-CM | POA: Insufficient documentation

## 2013-05-10 DIAGNOSIS — R0602 Shortness of breath: Secondary | ICD-10-CM | POA: Insufficient documentation

## 2013-05-10 DIAGNOSIS — Z87442 Personal history of urinary calculi: Secondary | ICD-10-CM | POA: Insufficient documentation

## 2013-05-10 DIAGNOSIS — R0609 Other forms of dyspnea: Secondary | ICD-10-CM | POA: Insufficient documentation

## 2013-05-10 DIAGNOSIS — Z79899 Other long term (current) drug therapy: Secondary | ICD-10-CM | POA: Insufficient documentation

## 2013-05-10 LAB — CBC
MCHC: 33.1 g/dL (ref 30.0–36.0)
Platelets: 180 10*3/uL (ref 150–400)
RDW: 15.8 % — ABNORMAL HIGH (ref 11.5–15.5)
WBC: 6.3 10*3/uL (ref 4.0–10.5)

## 2013-05-10 LAB — BASIC METABOLIC PANEL
Chloride: 106 mEq/L (ref 96–112)
Creatinine, Ser: 1.2 mg/dL (ref 0.50–1.35)
GFR calc Af Amer: 64 mL/min — ABNORMAL LOW (ref >90)
GFR calc non Af Amer: 55 mL/min — ABNORMAL LOW (ref >90)
Potassium: 4.4 mEq/L (ref 3.5–5.1)

## 2013-05-10 LAB — POCT I-STAT TROPONIN I

## 2013-05-10 LAB — PRO B NATRIURETIC PEPTIDE: Pro B Natriuretic peptide (BNP): 255.6 pg/mL (ref 0–450)

## 2013-05-10 MED ORDER — MECLIZINE HCL 25 MG PO TABS: 25.0000 mg | ORAL_TABLET | Freq: Three times a day (TID) | ORAL | Status: DC | PRN

## 2013-05-10 MED ORDER — MECLIZINE HCL 25 MG PO TABS
25.0000 mg | ORAL_TABLET | Freq: Once | ORAL | Status: AC
  Administered 2013-05-10: 25 mg via ORAL
  Filled 2013-05-10: qty 1

## 2013-05-10 NOTE — ED Notes (Signed)
Pt is here with weakness, fatigue, dizziness, and shortness of breath for the last couple of days.  No chest pain

## 2013-05-10 NOTE — ED Provider Notes (Signed)
History     CSN: [id number]  Arrival date & time 05/10/13  1814   First MD Initiated Contact with Patient 05/10/13 1845      Chief Complaint  Patient presents with  . Dizziness  . Shortness of Breath    (Consider location/radiation/quality/duration/timing/severity/associated sxs/prior treatment) HPI Comments: Patient with history of CAD, Cabg in the past.  Presents with complaints of weakness, fatigue, shortness of breath, and dizziness for the past several days.  It is worse with ambulation and lying flat to sleep at night.  He denies any fevers or chills.  There is no cough but he does complain of a headache.    Patient is a 79 y.o. male presenting with shortness of breath. The history is provided by the patient.  Shortness of Breath Severity:  Moderate Onset quality:  Gradual Duration:  3 days Timing:  Constant Progression:  Worsening Chronicity:  New Context: activity   Relieved by:  Nothing Worsened by:  Nothing tried Ineffective treatments:  None tried Associated symptoms: no chest pain and no fever   Associated symptoms comment:  Dizziness   Past Medical History  Diagnosis Date  . CAD (coronary artery disease)   . Hypertension   . Kidney stones   . Prostate ca     Past Surgical History  Procedure Laterality Date  . Coronary artery bypass graft    . Prostate surgery    . Penile prosthesis implant    . Cardiac surgery      No family history on file.  History  Substance Use Topics  . Smoking status: Never Smoker   . Smokeless tobacco: Never Used  . Alcohol Use: No      Review of Systems  Constitutional: Positive for fatigue. Negative for fever.  Respiratory: Positive for shortness of breath.   Cardiovascular: Negative for chest pain.  All other systems reviewed and are negative.    Allergies  Review of patient's allergies indicates no known allergies.  Home Medications   Current Outpatient Rx  Name  Route  Sig  Dispense  Refill  . aspirin  EC 81 MG tablet   Oral   Take 81 mg by mouth every morning.         . fish oil-omega-3 fatty acids 1000 MG capsule   Oral   Take 1 g by mouth daily.         . metoprolol succinate (TOPROL-XL) 50 MG 24 hr tablet   Oral   Take 50 mg by mouth daily. Take with or immediately following a meal.         . Multiple Vitamins-Minerals (MULTIVITAMIN PO)   Oral   Take 1 tablet by mouth daily. Omega Q Plus Multivitamin         . olmesartan (BENICAR) 40 MG tablet   Oral   Take 40 mg by mouth daily.         . rosuvastatin (CRESTOR) 10 MG tablet   Oral   Take 10 mg by mouth at bedtime.           BP 164/74  Pulse 52  Temp(Src) 97.9 F (36.6 C) (Oral)  Resp 21  SpO2 97%  Physical Exam  Nursing note and vitals reviewed. Constitutional: He is oriented to person, place, and time. He appears well-developed and well-nourished. No distress.  HENT:  Head: Normocephalic and atraumatic.  Mouth/Throat: Oropharynx is clear and moist.  Eyes: EOM are normal. Pupils are equal, round, and reactive to light.  Neck: Normal  range of motion. Neck supple.  Cardiovascular: Normal rate, regular rhythm and normal heart sounds.   No murmur heard. Pulmonary/Chest: Effort normal and breath sounds normal. No respiratory distress. He has no wheezes.  Abdominal: Soft. Bowel sounds are normal. He exhibits no distension. There is no tenderness.  Musculoskeletal: Normal range of motion. He exhibits no edema.  Lymphadenopathy:    He has no cervical adenopathy.  Neurological: He is alert and oriented to person, place, and time. No cranial nerve deficit. He exhibits normal muscle tone. Coordination normal.  Skin: Skin is warm and dry. He is not diaphoretic.    ED Course  Procedures (including critical care time)  Labs Reviewed  CBC - Abnormal; Notable for the following:    RBC 4.18 (*)    Hemoglobin 12.0 (*)    HCT 36.3 (*)    RDW 15.8 (*)    All other components within normal limits  BASIC  METABOLIC PANEL  PRO B NATRIURETIC PEPTIDE   Dg Chest 2 View  05/10/2013   *RADIOLOGY REPORT*  Clinical Data: Shortness of breath, dizziness, nausea.  CHEST - 2 VIEW  Comparison: 02/20/2011  Findings: Prior CABG.  Scarring/fibrosis in the lungs bilaterally, most pronounced in the lung bases.  Heart is normal size.  No acute opacities or effusions.  Degenerative spurring in the thoracic spine.  IMPRESSION: Stable chronic changes.  No acute findings.   Original Report Authenticated By: Charlett Nose, [name]     No diagnosis found.   Date: 05/10/2013  Rate: 58  Rhythm: sinus tachycardia  QRS Axis: normal  Intervals: normal  ST/T Wave abnormalities: normal  Conduction Disutrbances:none  Narrative Interpretation:   Old EKG Reviewed: unchanged    MDM  The patient presents here with complaints of dizziness that sounds vertiginous in nature.  The workup is negative for any acute process and he feels much better with the meclizine.  His oxygen levels are in the upper nineties on room air and the chest xray is clear.  I feel as though he is stable for discharge.          Geoffery Lyons, MD 05/10/13 2141

## 2013-09-16 ENCOUNTER — Emergency Department (HOSPITAL_COMMUNITY)
Admission: EM | Admit: 2013-09-16 | Discharge: 2013-09-16 | Disposition: A | Discharge status: Home / Self Care | Payer: Medicare Other | Attending: Emergency Medicine | Admitting: Emergency Medicine

## 2013-09-16 ENCOUNTER — Encounter (HOSPITAL_COMMUNITY): Payer: Self-pay | Admitting: Emergency Medicine

## 2013-09-16 ENCOUNTER — Emergency Department (HOSPITAL_COMMUNITY): Payer: Medicare Other

## 2013-09-16 DIAGNOSIS — Z951 Presence of aortocoronary bypass graft: Secondary | ICD-10-CM | POA: Insufficient documentation

## 2013-09-16 DIAGNOSIS — J069 Acute upper respiratory infection, unspecified: Secondary | ICD-10-CM | POA: Insufficient documentation

## 2013-09-16 DIAGNOSIS — Z79899 Other long term (current) drug therapy: Secondary | ICD-10-CM | POA: Insufficient documentation

## 2013-09-16 DIAGNOSIS — I10 Essential (primary) hypertension: Secondary | ICD-10-CM | POA: Insufficient documentation

## 2013-09-16 DIAGNOSIS — J3489 Other specified disorders of nose and nasal sinuses: Secondary | ICD-10-CM | POA: Insufficient documentation

## 2013-09-16 DIAGNOSIS — Z7982 Long term (current) use of aspirin: Secondary | ICD-10-CM | POA: Insufficient documentation

## 2013-09-16 DIAGNOSIS — Z8546 Personal history of malignant neoplasm of prostate: Secondary | ICD-10-CM | POA: Insufficient documentation

## 2013-09-16 DIAGNOSIS — Z87442 Personal history of urinary calculi: Secondary | ICD-10-CM | POA: Insufficient documentation

## 2013-09-16 DIAGNOSIS — I2510 Atherosclerotic heart disease of native coronary artery without angina pectoris: Secondary | ICD-10-CM | POA: Insufficient documentation

## 2013-09-16 DIAGNOSIS — R110 Nausea: Secondary | ICD-10-CM | POA: Insufficient documentation

## 2013-09-16 DIAGNOSIS — J029 Acute pharyngitis, unspecified: Secondary | ICD-10-CM | POA: Insufficient documentation

## 2013-09-16 DIAGNOSIS — R5381 Other malaise: Secondary | ICD-10-CM | POA: Insufficient documentation

## 2013-09-16 DIAGNOSIS — R6883 Chills (without fever): Secondary | ICD-10-CM | POA: Insufficient documentation

## 2013-09-16 DIAGNOSIS — R0982 Postnasal drip: Secondary | ICD-10-CM | POA: Insufficient documentation

## 2013-09-16 MED ORDER — ALBUTEROL SULFATE HFA 108 (90 BASE) MCG/ACT IN AERS: 2.0000 | INHALATION_SPRAY | RESPIRATORY_TRACT | Status: DC | PRN

## 2013-09-16 MED ORDER — AZITHROMYCIN 250 MG PO TABS: 250.0000 mg | ORAL_TABLET | Freq: Every day | ORAL | Status: DC

## 2013-09-16 MED ORDER — AEROCHAMBER PLUS W/MASK MISC: Status: DC

## 2013-09-16 MED ORDER — IPRATROPIUM BROMIDE 0.02 % IN SOLN
0.5000 mg | Freq: Once | RESPIRATORY_TRACT | Status: AC
  Administered 2013-09-16: 0.5 mg via RESPIRATORY_TRACT

## 2013-09-16 MED ORDER — HYDROCODONE-HOMATROPINE 5-1.5 MG/5ML PO SYRP: 5.0000 mL | ORAL_SOLUTION | Freq: Four times a day (QID) | ORAL | Status: DC | PRN

## 2013-09-16 MED ORDER — ALBUTEROL SULFATE (5 MG/ML) 0.5% IN NEBU
2.5000 mg | INHALATION_SOLUTION | Freq: Once | RESPIRATORY_TRACT | Status: AC
  Administered 2013-09-16: 2.5 mg via RESPIRATORY_TRACT

## 2013-09-16 MED ORDER — FLUTICASONE PROPIONATE 50 MCG/ACT NA SUSP: 2.0000 | Freq: Every day | NASAL | Status: DC

## 2013-09-16 NOTE — ED Notes (Signed)
Pt. Stated, i [place] a [place] of people and I've been sick ever since with a bad cold, cough, and feeling weak.

## 2013-09-16 NOTE — ED Notes (Signed)
The pt has a cold and for the past 3-4 days he has had nasal congestion and post nasal drip.

## 2013-09-16 NOTE — ED Provider Notes (Signed)
CSN: [id number]     Arrival date & time 09/16/13  1653 History   First MD Initiated Contact with Patient 09/16/13 1810    This chart was scribed for [name] [name] - PA  by Ladona Ridgel Day, ED scribe. This patient was seen in room TR09C/TR09C and the patient's care was started at 1653.  Chief Complaint  Patient presents with  . Nasal Congestion   Patient is a 79 y.o. male presenting with cough. The history is provided by the patient. No language interpreter was used.  Cough Cough characteristics:  Harsh Severity:  Moderate Onset quality:  Gradual Duration:  4 days Timing:  Constant Progression:  Worsening Chronicity:  New Relieved by:  Nothing Worsened by:  Nothing tried Ineffective treatments:  Cough suppressants Associated symptoms: chills, rhinorrhea, shortness of breath, sinus congestion and sore throat   Associated symptoms: no chest pain, no ear pain, no fever, no headaches, no myalgias, no rash and no wheezing    HPI Comments: [name] is a 79 y.o. male who presents to the Emergency Department complaining of constant, gradually worsening cough and congestion, onset 4 days ago with associated SOB. He states congestion in his nose and upper airway and had to sit up last PM to help himself breathe better. He tried over-the-counter cough syrup and vitamin C w/out improvement in his sx. He states associated chills, nausea, fatigue, decreased appetite, SOB. He denies fever, emesis, diarrhea. He has a medical hx of kidney stones and CABG.  Past Medical History  Diagnosis Date  . CAD (coronary artery disease)   . Hypertension   . Kidney stones   . Prostate ca    Past Surgical History  Procedure Laterality Date  . Coronary artery bypass graft    . Prostate surgery    . Penile prosthesis implant    . Cardiac surgery     No family history on file. History  Substance Use Topics  . Smoking status: Never Smoker   . Smokeless tobacco: Never Used  . Alcohol Use: No     Review of Systems  Constitutional: Positive for chills and fatigue. Negative for fever and appetite change.  HENT: Positive for congestion, postnasal drip, rhinorrhea, sinus pressure and sore throat. Negative for ear discharge, ear pain and mouth sores.   Eyes: Negative for visual disturbance.  Respiratory: Positive for cough, chest tightness and shortness of breath. Negative for wheezing and stridor.   Cardiovascular: Negative for chest pain, palpitations and leg swelling.  Gastrointestinal: Positive for nausea. Negative for vomiting, abdominal pain and diarrhea.  Genitourinary: Negative for dysuria, urgency, frequency and hematuria.  Musculoskeletal: Negative for arthralgias, back pain, myalgias and neck stiffness.  Skin: Negative for color change and rash.  Neurological: Negative for syncope, weakness, light-headedness, numbness and headaches.  Hematological: Negative for adenopathy.  Psychiatric/Behavioral: The patient is not nervous/anxious.   All other systems reviewed and are negative.  A complete 10 system review of systems was obtained and all systems are negative except as noted in the HPI and PMH.    Allergies  Review of patient's allergies indicates no known allergies.  Home Medications   Current Outpatient Rx  Name  Route  Sig  Dispense  Refill  . aspirin EC 81 MG tablet   Oral   Take 81 mg by mouth every morning.         . fish oil-omega-3 fatty acids 1000 MG capsule   Oral   Take 1 g by mouth daily.         Marland Kitchen  Krill Oil 1000 MG CAPS   Oral   Take 1,000 mg by mouth at bedtime.         . metoprolol succinate (TOPROL-XL) 50 MG 24 hr tablet   Oral   Take 50 mg by mouth daily. Take with or immediately following a meal.         . naproxen sodium (ANAPROX) 220 MG tablet   Oral   Take 220 mg by mouth daily as needed (pain).         Marland Kitchen olmesartan (BENICAR) 40 MG tablet   Oral   Take 40 mg by mouth daily.         . rosuvastatin (CRESTOR) 10 MG  tablet   Oral   Take 10 mg by mouth at bedtime.         Marland Kitchen albuterol (PROVENTIL HFA;VENTOLIN HFA) 108 (90 BASE) MCG/ACT inhaler   Inhalation   Inhale 2 puffs into the lungs every 4 (four) hours as needed for wheezing or shortness of breath.   1 Inhaler   3   . azithromycin (ZITHROMAX) 250 MG tablet   Oral   Take 1 tablet (250 mg total) by mouth daily. Take first 2 tablets together, then 1 every day until finished.   6 tablet   0   . fluticasone (FLONASE) 50 MCG/ACT nasal spray   Nasal   Place 2 sprays into the nose daily.   16 g   2   . HYDROcodone-homatropine (HYCODAN) 5-1.5 MG/5ML syrup   Oral   Take 5 mLs by mouth every 6 (six) hours as needed for cough.   120 mL   0   . Spacer/Aero-Holding Chambers (AEROCHAMBER PLUS WITH MASK) inhaler      Use as instructed   1 each   2    Triage Vitals: BP 152/80  Pulse 63  Temp(Src) 98.4 F (36.9 C) (Oral)  Resp 20  Wt 301 lb 12.8 oz (136.896 kg)  SpO2 98% Physical Exam  Nursing note and vitals reviewed. Constitutional: He is oriented to person, place, and time. He appears well-developed and well-nourished. No distress.  Awake, alert, nontoxic appearance  HENT:  Head: Normocephalic and atraumatic.  Right Ear: Tympanic membrane, external ear and ear canal normal.  Left Ear: Tympanic membrane, external ear and ear canal normal.  Nose: Mucosal edema and rhinorrhea present. No epistaxis. Right sinus exhibits no maxillary sinus tenderness and no frontal sinus tenderness. Left sinus exhibits no maxillary sinus tenderness and no frontal sinus tenderness.  Mouth/Throat: Uvula is midline and mucous membranes are normal. Mucous membranes are not pale and not cyanotic. Posterior oropharyngeal erythema present. No oropharyngeal exudate, posterior oropharyngeal edema or tonsillar abscesses.  TMs non erythematous, non bulging bilaterally.    Eyes: Conjunctivae and EOM are normal. Pupils are equal, round, and reactive to light. No scleral  icterus.  Neck: Normal range of motion and full passive range of motion without pain. Neck supple. No tracheal deviation present.  Cardiovascular: Normal rate, regular rhythm, normal heart sounds and intact distal pulses.   No murmur heard. Pulmonary/Chest: Effort normal. No accessory muscle usage or stridor. Not tachypneic. No respiratory distress. He has decreased breath sounds. He has wheezes. He has no rhonchi. He has no rales. He exhibits tenderness. He exhibits no bony tenderness.  Mild, generalized tenderness to palpation throughout Wheezes and decreased breath sounds throughout; no rales or rhonchi  Abdominal: Soft. Bowel sounds are normal. He exhibits no distension and no mass. There is no tenderness. There  is no rebound, no guarding and no CVA tenderness.  Musculoskeletal: Normal range of motion. He exhibits no edema.  Lymphadenopathy:       Head (right side): No submental, no submandibular, no tonsillar, no preauricular, no posterior auricular and no occipital adenopathy present.       Head (left side): No submental, no submandibular, no tonsillar, no preauricular, no posterior auricular and no occipital adenopathy present.    He has no cervical adenopathy.       Right cervical: No superficial cervical, no deep cervical and no posterior cervical adenopathy present.      Left cervical: No superficial cervical, no deep cervical and no posterior cervical adenopathy present.  No lymphadenopathy  Neurological: He is alert and oriented to person, place, and time.  Speech is clear and goal oriented Moves extremities without ataxia  Skin: Skin is warm and dry. No rash noted. He is not diaphoretic.  Psychiatric: He has a normal mood and affect. His behavior is normal.    ED Course  Procedures (including critical care time) DIAGNOSTIC STUDIES: Oxygen Saturation is 98% on room air, normal by my interpretation.    COORDINATION OF CARE: At 635 PM Discussed treatment plan with patient which  includes CXR, breathing treatment. Patient agrees.   Labs Review Labs Reviewed - No data to display Imaging Review Dg Chest 2 View  09/16/2013   CLINICAL DATA:  Congestion  EXAM: CHEST  2 VIEW  COMPARISON:  05/10/2013  FINDINGS: Lungs are under aerated. Bibasilar heterogeneous opacities have increased compatible with atelectasis versus airspace disease. No Kerley B-lines. Upper normal heart size. Vascular congestion. No pneumothorax. No pleural effusion.  IMPRESSION: Small volumes and bibasilar atelectasis favored over bibasilar airspace disease.   Electronically Signed   By: Maryclare Bean [name]   On: 09/16/2013 19:56    MDM   1. Viral URI with cough    Paulita Cradle presents with URI symptoms and cough.  Decreased breath sounds and wheezing throughout. Will obtain chest x-ray and give albuterol treatment.  8:31 PM Patient reports decreased shortness of breath after albuterol treatment. Mild wheezing in the bases persist but significantly increased tidal volume. Patient is afebrile and non-tachycardic.  Chest x-ray  with small volumes and bibasilar atelectasis favored over bibasilar airspace disease however due to patient's age and lung exam will treat as suspicious for pneumonia.  I personally reviewed the imaging tests through PACS system.  I reviewed available ER/hospitalization records through the EMR.  Patients symptoms are consistent with URI, likely viral etiology. Discussed that antibiotics are not indicated for viral infections. Pt will be discharged with symptomatic treatment.  No current signs of respiratory distress. Lung exam improved after nebulizer treatment.  Patient discharged with symptomatic treatment. Recommend followup with primary care later this week or early next week to ensure resolution of symptoms. Verbalizes understanding and is agreeable with plan. Pt is hemodynamically stable & in NAD prior to dc.  It has been determined that no acute conditions requiring further  emergency intervention are present at this time. The patient/guardian have been advised of the diagnosis and plan. We have discussed signs and symptoms that warrant return to the ED, such as changes or worsening in symptoms.   Vital signs are stable at discharge.   BP 152/80  Pulse 63  Temp(Src) 98.4 F (36.9 C) (Oral)  Resp 20  Wt 301 lb 12.8 oz (136.896 kg)  SpO2 98%  Patient/guardian has voiced understanding and agreed to follow-up with the PCP  or specialist.    I personally performed the services described in this documentation, which was scribed in my presence. The recorded information has been reviewed and is accurate.      [name] Coleby Yett, PA-C 09/16/13 2033  Dierdre Forth, PA-C 09/16/13 2039

## 2013-09-18 NOTE — ED Provider Notes (Signed)
Medical screening examination/treatment/procedure(s) were performed by non-physician practitioner and as supervising physician I was immediately available for consultation/collaboration.   Latana Colin B. Johnthomas Lader, MD 09/18/13 2031 

## 2013-11-04 ENCOUNTER — Encounter (HOSPITAL_COMMUNITY): Payer: Self-pay | Admitting: Emergency Medicine

## 2013-11-04 ENCOUNTER — Emergency Department (INDEPENDENT_AMBULATORY_CARE_PROVIDER_SITE_OTHER)
Admission: EM | Admit: 2013-11-04 | Discharge: 2013-11-04 | Disposition: A | Discharge status: Home / Self Care | Payer: Medicare Other | Source: Home / Self Care | Attending: Emergency Medicine | Admitting: Emergency Medicine

## 2013-11-04 DIAGNOSIS — K0401 Reversible pulpitis: Secondary | ICD-10-CM

## 2013-11-04 MED ORDER — METRONIDAZOLE 500 MG PO TABS: 500.0000 mg | ORAL_TABLET | Freq: Three times a day (TID) | ORAL | Status: DC

## 2013-11-04 MED ORDER — AMOXICILLIN 500 MG PO CAPS: 500.0000 mg | ORAL_CAPSULE | Freq: Three times a day (TID) | ORAL | Status: DC

## 2013-11-04 MED ORDER — HYDROCODONE-ACETAMINOPHEN 5-325 MG PO TABS: ORAL_TABLET | ORAL | Status: DC

## 2013-11-04 NOTE — ED Notes (Signed)
Reports he broke a tooth 1 week ago, pain x 3 days

## 2013-11-04 NOTE — ED Provider Notes (Signed)
Chief Complaint:   Chief Complaint  Patient presents with  . Dental Problem    History of Present Illness:   [name] is an 79 year old male who cough his right, lower, second molar a week ago. It's been increasingly painful since then. There's been no purulent drainage, no swelling of the gingiva, or the cheek. It hurts to chew on that side but he can open his mouth fully and has no difficulty swallowing or breathing. He denies fever, chills, headache, ear pain, eye pain, facial swelling, neck pain, or adenopathy.  Review of Systems:  Other than noted above, the patient denies any of the following symptoms: Systemic:  No fever, chills,  Or sweats. ENT:  No headache, ear ache, sore throat, nasal congestion, facial pain, or swelling. Lymphatic:  No adenopathy. Lungs:  No coughing, wheezing or shortness of breath.  PMFSH:  Past medical history, family history, social history, meds, and allergies were reviewed. He has no medication allergies. He's on numerous medications including albuterol, aspirin, Flonase, metoprolol, Benicar, and Crestor. He has a history of coronary artery disease with CABG in the past, hypertension, kidney stones, and prostate cancer.  Physical Exam:   Vital signs:  BP 164/81  Pulse 59  Temp(Src) 97.9 F (36.6 C) (Oral)  Resp 98  SpO2 98% General:  Alert, oriented, in no distress. ENT:  TMs and canals normal.  Nasal mucosa normal. Mouth exam:  His right, lower, second molar was broken, there was no surrounding swelling and no purulent drainage. No collection of pus. No swelling of the floor the mouth and the tongue. The pharynx was clear and widely patent. Neck:  No swelling or adenopathy. Lungs:  Breath sounds clear and equal bilaterally.  No wheezes, rales or rhonchi. Heart:  Regular rhythm.  No gallops or murmers. Skin:  Clear, warm and dry.   Course in Urgent Care Center:   A dental block was done. The area around the tooth was prepped with benzocaine  gel, and using a dental syringe, 3 cc of Marcaine were injected at the root of the tooth.  Assessment:  The encounter diagnosis was Pulpitis.  Plan:   1.  Meds:  The following meds were prescribed:   Discharge Medication List as of 11/04/2013  3:49 PM    START taking these medications   Details  amoxicillin (AMOXIL) 500 MG capsule Take 1 capsule (500 mg total) by mouth 3 (three) times daily., Starting 11/04/2013, Until Discontinued, Normal    HYDROcodone-acetaminophen (NORCO/VICODIN) 5-325 MG per tablet 1 to 2 tabs every 4 to 6 hours as needed for pain., Print    metroNIDAZOLE (FLAGYL) 500 MG tablet Take 1 tablet (500 mg total) by mouth 3 (three) times daily., Starting 11/04/2013, Until Discontinued, Normal        2.  Patient Education/Counseling:  The patient was given appropriate handouts, self care instructions, and instructed in symptomatic relief. Suggested sleeping with head of bed elevated and hot salt water mouthwash.   3.  Follow up:  The patient was told to follow up if no better in 3 to 4 days, if becoming worse in any way, and given some red flag symptoms such as difficulty swallowing or breathing which would prompt immediate return.  Follow up with a dentist as soon as posssible.     Reuben Likes, MD 11/04/13 2113

## 2014-02-15 ENCOUNTER — Encounter (HOSPITAL_COMMUNITY): Payer: Self-pay | Admitting: Emergency Medicine

## 2014-02-15 ENCOUNTER — Emergency Department (HOSPITAL_COMMUNITY)
Admission: EM | Admit: 2014-02-15 | Discharge: 2014-02-15 | Disposition: A | Discharge status: Home / Self Care | Payer: Medicare Other | Attending: Emergency Medicine | Admitting: Emergency Medicine

## 2014-02-15 DIAGNOSIS — K029 Dental caries, unspecified: Secondary | ICD-10-CM | POA: Insufficient documentation

## 2014-02-15 DIAGNOSIS — R599 Enlarged lymph nodes, unspecified: Secondary | ICD-10-CM | POA: Insufficient documentation

## 2014-02-15 DIAGNOSIS — I10 Essential (primary) hypertension: Secondary | ICD-10-CM | POA: Insufficient documentation

## 2014-02-15 DIAGNOSIS — Z7982 Long term (current) use of aspirin: Secondary | ICD-10-CM | POA: Insufficient documentation

## 2014-02-15 DIAGNOSIS — Z951 Presence of aortocoronary bypass graft: Secondary | ICD-10-CM | POA: Insufficient documentation

## 2014-02-15 DIAGNOSIS — IMO0002 Reserved for concepts with insufficient information to code with codable children: Secondary | ICD-10-CM | POA: Insufficient documentation

## 2014-02-15 DIAGNOSIS — K089 Disorder of teeth and supporting structures, unspecified: Secondary | ICD-10-CM | POA: Insufficient documentation

## 2014-02-15 DIAGNOSIS — K0889 Other specified disorders of teeth and supporting structures: Secondary | ICD-10-CM

## 2014-02-15 DIAGNOSIS — I2510 Atherosclerotic heart disease of native coronary artery without angina pectoris: Secondary | ICD-10-CM | POA: Insufficient documentation

## 2014-02-15 DIAGNOSIS — Z8546 Personal history of malignant neoplasm of prostate: Secondary | ICD-10-CM | POA: Insufficient documentation

## 2014-02-15 DIAGNOSIS — Z87442 Personal history of urinary calculi: Secondary | ICD-10-CM | POA: Insufficient documentation

## 2014-02-15 MED ORDER — HYDROCODONE-ACETAMINOPHEN 5-325 MG PO TABS
1.0000 | ORAL_TABLET | Freq: Once | ORAL | Status: AC
  Administered 2014-02-15: 1 via ORAL
  Filled 2014-02-15: qty 1

## 2014-02-15 MED ORDER — PENICILLIN V POTASSIUM 500 MG PO TABS
500.0000 mg | ORAL_TABLET | Freq: Four times a day (QID) | ORAL | Status: DC
Start: 2014-02-15 — End: 2016-07-29

## 2014-02-15 MED ORDER — HYDROCODONE-ACETAMINOPHEN 5-325 MG PO TABS: 1.0000 | ORAL_TABLET | ORAL | Status: DC | PRN

## 2014-02-15 NOTE — ED Notes (Signed)
PA made aware of pt's BP. Pt in NAD.

## 2014-02-15 NOTE — ED Provider Notes (Signed)
CSN: [id number]     Arrival date & time 02/15/14  1600 History   First MD Initiated Contact with Patient 02/15/14 1648     This chart was scribed for non-physician practitioner, Antonietta Breach, PA-C working with Saddie Benders. Dorna Mai, MD by Forrestine Him, ED Scribe. This patient was seen in room TR05C/TR05C and the patient's care was started at 4:54 PM.    Chief Complaint  Patient presents with  . Dental Pain   The history is provided by the patient. No language interpreter was used.    HPI Comments: WERNER LABELLA is a 80 y.o. Male with a PMHx of CAD and HTN  who presents to the Emergency Department complaining of non radiating, gradually worsening, constant bilateral lower dental pain described as throbbing and aching with associated mild bleeding that initially started 2 months ago on the right side, and 2 days ago on the left side. Pt states his pain is exacerbated by chewing. He has not tried anything OTC for his discomfort, but states he was been using Peroxide to rinse his mouth. He states he was seen by his dentist for the pain on his right side, and was put on an antibiotic for symptoms. He has been unable to return to his dentist due to an outstanding balance. At this time he denies any fever, inability to swallow, drooling, and new/recent trauma or injury. Pt has no other concerns or complaints this visit.   Past Medical History  Diagnosis Date  . CAD (coronary artery disease)   . Hypertension   . Kidney stones   . Prostate ca    Past Surgical History  Procedure Laterality Date  . Coronary artery bypass graft    . Prostate surgery    . Penile prosthesis implant    . Cardiac surgery     History reviewed. No pertinent family history. History  Substance Use Topics  . Smoking status: Never Smoker   . Smokeless tobacco: Never Used  . Alcohol Use: No    Review of Systems  Constitutional: Negative for fever and chills.  HENT: Positive for dental problem.   Skin: Negative for rash.   Neurological: Negative for numbness.     Allergies  Review of patient's allergies indicates no known allergies.  Home Medications   Current Outpatient Rx  Name  Route  Sig  Dispense  Refill  . aspirin EC 81 MG tablet   Oral   Take 81 mg by mouth every morning.         . fish oil-omega-3 fatty acids 1000 MG capsule   Oral   Take 1 g by mouth daily.         . fluticasone (FLONASE) 50 MCG/ACT nasal spray   Nasal   Place 2 sprays into the nose daily.   16 g   2   . metoprolol succinate (TOPROL-XL) 50 MG 24 hr tablet   Oral   Take 50 mg by mouth daily. Take with or immediately following a meal.         . naproxen sodium (ANAPROX) 220 MG tablet   Oral   Take 220 mg by mouth daily as needed (pain).         Marland Kitchen olmesartan (BENICAR) 40 MG tablet   Oral   Take 40 mg by mouth daily.         . rosuvastatin (CRESTOR) 10 MG tablet   Oral   Take 10 mg by mouth at bedtime.         Marland Kitchen  HYDROcodone-acetaminophen (NORCO/VICODIN) 5-325 MG per tablet   Oral   Take 1 tablet by mouth every 4 (four) hours as needed.   11 tablet   0   . penicillin v potassium (VEETID) 500 MG tablet   Oral   Take 1 tablet (500 mg total) by mouth 4 (four) times daily.   40 tablet   0    Triage Vitals: BP 172/103  Pulse 75  Temp(Src) 98.3 F (36.8 C)  Resp 18  SpO2 97%   Physical Exam  Nursing note and vitals reviewed. Constitutional: He is oriented to person, place, and time. He appears well-developed and well-nourished. No distress.  HENT:  Head: Normocephalic and atraumatic.  Right Ear: Hearing, external ear and ear canal normal. No drainage. No mastoid tenderness.  Left Ear: Hearing, external ear and ear canal normal. No drainage. No mastoid tenderness.  Nose: Nose normal.  Mouth/Throat: Uvula is midline, oropharynx is clear and moist and mucous membranes are normal. No oral lesions. No trismus in the jaw. Abnormal dentition. Dental caries present. No uvula swelling. No  oropharyngeal exudate.    Speech is goal oriented. Patient tolerates secretions without difficulty or drooling. Uvula midline. No oral lesions or bleeding appreciated. No area of fluctuance or gingival erythema. No trismus or stridor.  Eyes: Conjunctivae and EOM are normal. Pupils are equal, round, and reactive to light. No scleral icterus.  Neck: Normal range of motion. Neck supple.  Cardiovascular: Normal rate, regular rhythm and intact distal pulses.   Distal radial pulses 2+ bilaterally  Pulmonary/Chest: Effort normal and breath sounds normal. No stridor. No respiratory distress.  Musculoskeletal: Normal range of motion.  Lymphadenopathy:    He has cervical adenopathy (mild anterior cervical bilaterally).  Neurological: He is alert and oriented to person, place, and time.  Skin: Skin is warm and dry. No rash noted. He is not diaphoretic. No erythema. No pallor.  Psychiatric: He has a normal mood and affect. His behavior is normal.    ED Course  Procedures (including critical care time)  DIAGNOSTIC STUDIES: Oxygen Saturation is 97% on RA, Normal by my interpretation.    COORDINATION OF CARE: 4:58 PM- Will give pain medication. Discussed treatment plan with pt at bedside and pt agreed to plan.     Labs Review Labs Reviewed - No data to display Imaging Review No results found.   EKG Interpretation None      MDM   Final diagnoses:  Dentalgia   Uncomplicated dentalgia. Patient well and nontoxic appearing, hemodynamically stable, and afebrile. Uvula midline without evidence of peritonsillar abscess. Patient tolerating secretions without difficulty or drooling. No area of fluctuance noted on examination. No red flags or signs concerning for Ludwig's angina. Patient stable and appropriate for discharge with instruction to followup with his dentist. Will start patient on penicillin to cover for infection. Norco prescribed for pain control as needed. Return precautions provided and  patient agreeable to plan with no unaddressed concerns.  Patient case discussed with Dr. Dorna Mai who is in agreement with this workup, assessment, management plan, and patient's stability for discharge.  I personally performed the services described in this documentation, which was scribed in my presence. The recorded information has been reviewed and is accurate.    Antonietta Breach, PA-C 02/15/14 1733

## 2014-02-15 NOTE — Discharge Instructions (Signed)
Take Penicillin as prescribed. Recommend that you take Norco as prescribed for pain control as needed. Followup with your dentist regarding her symptoms. Return if symptoms worsen.  Dental Pain A tooth ache may be caused by cavities (tooth decay). Cavities expose the nerve of the tooth to air and hot or cold temperatures. It may come from an infection or abscess (also called a boil or furuncle) around your tooth. It is also often caused by dental caries (tooth decay). This causes the pain you are having. DIAGNOSIS  Your caregiver can diagnose this problem by exam. TREATMENT   If caused by an infection, it may be treated with medications which kill germs (antibiotics) and pain medications as prescribed by your caregiver. Take medications as directed.  Only take over-the-counter or prescription medicines for pain, discomfort, or fever as directed by your caregiver.  Whether the tooth ache today is caused by infection or dental disease, you should see your dentist as soon as possible for further care. SEEK MEDICAL CARE IF: The exam and treatment you received today has been provided on an emergency basis only. This is not a substitute for complete medical or dental care. If your problem worsens or new problems (symptoms) appear, and you are unable to meet with your dentist, call or return to this location. SEEK IMMEDIATE MEDICAL CARE IF:   You have a fever.  You develop redness and swelling of your face, jaw, or neck.  You are unable to open your mouth.  You have severe pain uncontrolled by pain medicine. MAKE SURE YOU:   Understand these instructions.  Will watch your condition.  Will get help right away if you are not doing well or get worse. Document Released: 11/26/2005 Document Revised: 02/18/2012 Document Reviewed: 07/14/2008 [facility] Patient Information 2014 [place].  Dental Care and Dentist Visits Dental care supports good overall health. Regular dental visits can also  help you avoid dental pain, bleeding, infection, and other more serious health problems in the future. It is important to keep the mouth healthy because diseases in the teeth, gums, and other oral tissues can spread to other areas of the body. Some problems, such as diabetes, heart disease, and pre-term labor have been associated with poor oral health.  See your dentist every 6 months. If you experience emergency problems such as a toothache or broken tooth, go to the dentist right away. If you see your dentist regularly, you may catch problems early. It is easier to be treated for problems in the early stages.  WHAT TO EXPECT AT A DENTIST VISIT  Your dentist will look for many common oral health problems and recommend proper treatment. At your regular dental visit, you can expect:  Gentle cleaning of the teeth and gums. This includes scraping and polishing. This helps to remove the sticky substance around the teeth and gums (plaque). Plaque forms in the mouth shortly after eating. Over time, plaque hardens on the teeth as tartar. If tartar is not removed regularly, it can cause problems. Cleaning also helps remove stains.  Periodic X-rays. These pictures of the teeth and supporting bone will help your dentist assess the health of your teeth.  Periodic fluoride treatments. Fluoride is a natural mineral shown to help strengthen teeth. Fluoride treatmentinvolves applying a fluoride gel or varnish to the teeth. It is most commonly done in children.  Examination of the mouth, tongue, jaws, teeth, and gums to look for any oral health problems, such as:  Cavities (dental caries). This is decay  on the tooth caused by plaque, sugar, and acid in the mouth. It is best to catch a cavity when it is small.  Inflammation of the gums caused by plaque buildup (gingivitis).  Problems with the mouth or malformed or misaligned teeth.  Oral cancer or other diseases of the soft tissues or jaws. KEEP YOUR TEETH AND  GUMS HEALTHY For healthy teeth and gums, follow these general guidelines as well as your dentist's specific advice:  Have your teeth professionally cleaned at the dentist every 6 months.  Brush twice daily with a fluoride toothpaste.  Floss your teeth daily.  Ask your dentist if you need fluoride supplements, treatments, or fluoride toothpaste.  Eat a healthy diet. Reduce foods and drinks with added sugar.  Avoid smoking. TREATMENT FOR ORAL HEALTH PROBLEMS If you have oral health problems, treatment varies depending on the conditions present in your teeth and gums.  Your caregiver will most likely recommend good oral hygiene at each visit.  For cavities, gingivitis, or other oral health disease, your caregiver will perform a procedure to treat the problem. This is typically done at a separate appointment. Sometimes your caregiver will refer you to another dental specialist for specific tooth problems or for surgery. SEEK IMMEDIATE DENTAL CARE IF:  You have pain, bleeding, or soreness in the gum, tooth, jaw, or mouth area.  A permanent tooth becomes loose or separated from the gum socket.  You experience a blow or injury to the mouth or jaw area. Document Released: 08/08/2011 Document Revised: 02/18/2012 Document Reviewed: 08/08/2011 [facility] Patient Information 2014 [place], [place].

## 2014-02-15 NOTE — ED Notes (Addendum)
Per pt sts he has a cracked tooth on the right and another bad tooth on the left. sts has been going on for a month but can't afford to keep seeing the dentist. sts he was seen a month ago and given abx but not better. sts he has been going rinses at home without relief.

## 2014-02-17 NOTE — ED Provider Notes (Signed)
Medical screening examination/treatment/procedure(s) were performed by non-physician practitioner and as supervising physician I was immediately available for consultation/collaboration.   EKG Interpretation None        Saddie Benders. Dorna Mai, MD 02/17/14 2033

## 2014-09-20 ENCOUNTER — Ambulatory Visit (INDEPENDENT_AMBULATORY_CARE_PROVIDER_SITE_OTHER): Payer: Medicare Other | Admitting: Podiatry

## 2014-09-20 ENCOUNTER — Encounter: Payer: Self-pay | Admitting: Podiatry

## 2014-09-20 VITALS — BP 153/77 | HR 63 | Resp 16

## 2014-09-20 DIAGNOSIS — B351 Tinea unguium: Secondary | ICD-10-CM | POA: Diagnosis not present

## 2014-09-20 DIAGNOSIS — M79673 Pain in unspecified foot: Secondary | ICD-10-CM

## 2014-09-20 NOTE — Progress Notes (Signed)
   Subjective:    Patient ID: [name], male    DOB: Mar 07, 1931, 80 y.o.   MRN: [id number]  HPI Comments: Patient needs toenails cut.     Review of Systems  Musculoskeletal: Positive for arthralgias.  Skin:       Change in nails   All other systems reviewed and are negative.      Objective:   Physical Exam        Assessment & Plan:

## 2014-09-21 NOTE — Progress Notes (Signed)
Subjective:     Patient ID: [name], male   DOB: Apr 20, 1931, 80 y.o.   MRN: [id number]  HPI patient presents stating I need my toenails cut as they become very thick and I cannot do that and they are painful in shoe gear. Also I would like my feet checked   Review of Systems  All other systems reviewed and are negative.      Objective:   Physical Exam  Nursing note and vitals reviewed. Constitutional: He is oriented to person, place, and time.  Cardiovascular: Intact distal pulses.   Musculoskeletal: Normal range of motion.  Neurological: He is oriented to person, place, and time.  Skin: Skin is warm and dry.   neurovascular status is found to be moderately diminished with skin that is dry and moderate edema in the ankle region bilateral with negative Homans sign noted. Diminished range of motion of the subtalar and midtarsal joint and equinus condition noted and is noted to have very thick brittle yellow nailbeds 1-5 both feet that are painful when palpated     Assessment:     Mild vein disease is noted along with mycotic toenail infection both feet nailbeds 1-5 with pain    Plan:     H&P reviewed and recommended debridement which was accomplished today with no iatrogenic bleeding noted. Discussed possible compression stockings and elevation of feet at night

## 2014-12-27 ENCOUNTER — Ambulatory Visit (INDEPENDENT_AMBULATORY_CARE_PROVIDER_SITE_OTHER): Payer: Medicare Other | Admitting: Podiatry

## 2014-12-27 DIAGNOSIS — M79673 Pain in unspecified foot: Secondary | ICD-10-CM | POA: Diagnosis not present

## 2014-12-27 DIAGNOSIS — B351 Tinea unguium: Secondary | ICD-10-CM

## 2014-12-27 DIAGNOSIS — L84 Corns and callosities: Secondary | ICD-10-CM

## 2014-12-27 NOTE — Progress Notes (Signed)
Patient ID: [name], male   DOB: 19-Apr-1931, 81 y.o.   MRN: [id number]  Subjective: 81 year old male presents the opposite a with complaints of painful, thick nails to both of his feet on digits 1 through 5. He states the nails are painful particularly with pressure in certain shoes. Denies any redness or drainage from the nail sites. No other complaints at this time.  Objective: AAO 3, NAD DP/PT pulses palpable, CRT less than 3 seconds Protective sensation appears to be intact with Derrel Nip monofilament Nails are hypertrophic, dystrophic, elongated, brittle, discolored 10. No swelling erythema or drainage from the nail sites. Hyperkeratotic lesions on the medial aspect of the first metatarsal head bilaterally. Upon debridement no underlying ulceration and no clinical signs of infection. No open lesions or other pre-ulcerative lesions identified. No pain with calf compression, swelling, warmth, erythema.  Assessment: 81 year old male with symptomatic onychomycosis, bilateral medial first metatarsal head hyperkeratotic lesions  Plan: -Nails sharply debrided 10 without complications/bleeding. -Hyperkeratotic lesion sharply debrided 2 without complications/bleeding. -Discussed the importance of daily foot inspection. -Follow-up in 3 months or sooner should any problems arise. In the meantime, encouraged to call the office with any questions, concerns, change in symptoms.

## 2015-04-01 ENCOUNTER — Telehealth: Payer: Self-pay | Admitting: *Deleted

## 2015-04-01 NOTE — Telephone Encounter (Signed)
Pt states he is unable to make his 04/04/2015 appt and would like to reschedule to 05/04 or 04/2015.

## 2015-04-04 ENCOUNTER — Ambulatory Visit: Payer: Medicare Other

## 2015-04-15 ENCOUNTER — Ambulatory Visit (INDEPENDENT_AMBULATORY_CARE_PROVIDER_SITE_OTHER): Payer: Medicare Other

## 2015-04-15 DIAGNOSIS — B351 Tinea unguium: Secondary | ICD-10-CM | POA: Diagnosis not present

## 2015-04-15 DIAGNOSIS — M79673 Pain in unspecified foot: Secondary | ICD-10-CM | POA: Diagnosis not present

## 2015-04-15 NOTE — Progress Notes (Signed)
Patient ID: [name], male   DOB: 17-Oct-1931, 81 y.o.   MRN: [id number]  Subjective: 81 year old male presents the opposite a with complaints of painful, thick nails to both of his feet on digits 1 through 5. He states the nails are painful particularly with pressure in certain shoes. Denies any redness or drainage from the nail sites. No other complaints at this time.  Objective: AAO 3, NAD DP/PT pulses palpable, CRT less than 3 seconds Protective sensation appears to be intact with Derrel Nip monofilament Nails are hypertrophic, dystrophic, elongated, brittle, discolored 10. No swelling erythema or drainage from the nail sites. Hyperkeratotic lesions on the medial aspect of the first metatarsal head bilaterally. Upon debridement no underlying ulceration and no clinical signs of infection. No open lesions or other pre-ulcerative lesions identified. No pain with calf compression, swelling, warmth, erythema.  Assessment: 81 year old male with symptomatic onychomycosis, bilateral medial first metatarsal head hyperkeratotic lesions  Plan: -Nails sharply debrided 10 without complications/bleeding. -Hyperkeratotic lesion sharply debrided 2 without complications/bleeding. -Discussed the importance of daily foot inspection. -Follow-up in 3 months or sooner should any problems arise. In the meantime, encouraged to call the office with any questions, concerns, change in symptoms.

## 2015-04-19 ENCOUNTER — Emergency Department (HOSPITAL_COMMUNITY)
Admission: EM | Admit: 2015-04-19 | Discharge: 2015-04-19 | Disposition: A | Discharge status: Home / Self Care | Payer: Medicare Other | Attending: Emergency Medicine | Admitting: Emergency Medicine

## 2015-04-19 ENCOUNTER — Emergency Department (HOSPITAL_COMMUNITY): Payer: Medicare Other

## 2015-04-19 ENCOUNTER — Encounter (HOSPITAL_COMMUNITY): Payer: Self-pay | Admitting: *Deleted

## 2015-04-19 DIAGNOSIS — Z8546 Personal history of malignant neoplasm of prostate: Secondary | ICD-10-CM | POA: Insufficient documentation

## 2015-04-19 DIAGNOSIS — Z87442 Personal history of urinary calculi: Secondary | ICD-10-CM | POA: Diagnosis not present

## 2015-04-19 DIAGNOSIS — Z951 Presence of aortocoronary bypass graft: Secondary | ICD-10-CM | POA: Insufficient documentation

## 2015-04-19 DIAGNOSIS — Z7951 Long term (current) use of inhaled steroids: Secondary | ICD-10-CM | POA: Insufficient documentation

## 2015-04-19 DIAGNOSIS — J019 Acute sinusitis, unspecified: Secondary | ICD-10-CM | POA: Insufficient documentation

## 2015-04-19 DIAGNOSIS — R059 Cough, unspecified: Secondary | ICD-10-CM

## 2015-04-19 DIAGNOSIS — Z7982 Long term (current) use of aspirin: Secondary | ICD-10-CM | POA: Diagnosis not present

## 2015-04-19 DIAGNOSIS — Z791 Long term (current) use of non-steroidal anti-inflammatories (NSAID): Secondary | ICD-10-CM | POA: Insufficient documentation

## 2015-04-19 DIAGNOSIS — I10 Essential (primary) hypertension: Secondary | ICD-10-CM | POA: Diagnosis not present

## 2015-04-19 DIAGNOSIS — Z792 Long term (current) use of antibiotics: Secondary | ICD-10-CM | POA: Insufficient documentation

## 2015-04-19 DIAGNOSIS — R05 Cough: Secondary | ICD-10-CM

## 2015-04-19 DIAGNOSIS — I2510 Atherosclerotic heart disease of native coronary artery without angina pectoris: Secondary | ICD-10-CM | POA: Diagnosis not present

## 2015-04-19 DIAGNOSIS — Z79899 Other long term (current) drug therapy: Secondary | ICD-10-CM | POA: Insufficient documentation

## 2015-04-19 LAB — BASIC METABOLIC PANEL
Anion gap: 11 (ref 5–15)
BUN: 23 mg/dL — ABNORMAL HIGH (ref 6–20)
CALCIUM: 9.5 mg/dL (ref 8.9–10.3)
CO2: 22 mmol/L (ref 22–32)
CREATININE: 1.26 mg/dL — AB (ref 0.61–1.24)
Chloride: 107 mmol/L (ref 101–111)
GFR calc Af Amer: 60 mL/min — ABNORMAL LOW (ref >60)
GFR calc non Af Amer: 51 mL/min — ABNORMAL LOW (ref >60)
GLUCOSE: 104 mg/dL — AB (ref 70–99)
Potassium: 4.5 mmol/L (ref 3.5–5.1)
Sodium: 140 mmol/L (ref 135–145)

## 2015-04-19 LAB — CBC
HEMATOCRIT: 37.7 % — AB (ref 39.0–52.0)
Hemoglobin: 12.6 g/dL — ABNORMAL LOW (ref 13.0–17.0)
MCH: 29.3 pg (ref 26.0–34.0)
MCHC: 33.4 g/dL (ref 30.0–36.0)
MCV: 87.7 fL (ref 78.0–100.0)
PLATELETS: 180 10*3/uL (ref 150–400)
RBC: 4.3 MIL/uL (ref 4.22–5.81)
RDW: 15 % (ref 11.5–15.5)
WBC: 8 10*3/uL (ref 4.0–10.5)

## 2015-04-19 LAB — I-STAT TROPONIN, ED: Troponin i, poc: 0 ng/mL (ref 0.00–0.08)

## 2015-04-19 MED ORDER — ALBUTEROL SULFATE HFA 108 (90 BASE) MCG/ACT IN AERS
2.0000 | INHALATION_SPRAY | Freq: Once | RESPIRATORY_TRACT | Status: AC
  Administered 2015-04-19: 2 via RESPIRATORY_TRACT
  Filled 2015-04-19: qty 6.7

## 2015-04-19 MED ORDER — ALBUTEROL SULFATE (2.5 MG/3ML) 0.083% IN NEBU
5.0000 mg | INHALATION_SOLUTION | Freq: Once | RESPIRATORY_TRACT | Status: AC
  Administered 2015-04-19: 5 mg via RESPIRATORY_TRACT
  Filled 2015-04-19: qty 6

## 2015-04-19 MED ORDER — FLUTICASONE PROPIONATE 50 MCG/ACT NA SUSP: 2.0000 | Freq: Every day | NASAL | Status: DC

## 2015-04-19 MED ORDER — IPRATROPIUM BROMIDE 0.02 % IN SOLN
0.5000 mg | Freq: Once | RESPIRATORY_TRACT | Status: AC
  Administered 2015-04-19: 0.5 mg via RESPIRATORY_TRACT
  Filled 2015-04-19: qty 2.5

## 2015-04-19 MED ORDER — AMOXICILLIN-POT CLAVULANATE 875-125 MG PO TABS: 1.0000 | ORAL_TABLET | Freq: Two times a day (BID) | ORAL | Status: DC

## 2015-04-19 MED ORDER — BENZONATATE 100 MG PO CAPS: 100.0000 mg | ORAL_CAPSULE | Freq: Three times a day (TID) | ORAL | Status: DC | PRN

## 2015-04-19 NOTE — ED Notes (Signed)
MD at bedside. 

## 2015-04-19 NOTE — Discharge Instructions (Signed)
Recommend Tessalon as prescribed for cough. Take Augmentin as prescribed for sinus congestion/sinusitis. You may use Flonase as needed for persistent nasal congestion as well as an albuterol inhaler, 2 puffs every 4-6 hours, as needed for shortness of breath and chest tightness. Follow-up with your primary doctor for a recheck of symptoms by weeks and. Return to the emergency department as needed if symptoms worsen.  Cough, Adult  A cough is a reflex that helps clear your throat and airways. It can help heal the body or may be a reaction to an irritated airway. A cough may only last 2 or 3 weeks (acute) or may last more than 8 weeks (chronic).  CAUSES Acute cough:  Viral or bacterial infections. Chronic cough:  Infections.  Allergies.  Asthma.  Post-nasal drip.  Smoking.  Heartburn or acid reflux.  Some medicines.  Chronic lung problems (COPD).  Cancer. SYMPTOMS   Cough.  Fever.  Chest pain.  Increased breathing rate.  High-pitched whistling sound when breathing (wheezing).  Colored mucus that you cough up (sputum). TREATMENT   A bacterial cough may be treated with antibiotic medicine.  A viral cough must run its course and will not respond to antibiotics.  Your caregiver may recommend other treatments if you have a chronic cough. HOME CARE INSTRUCTIONS   Only take over-the-counter or prescription medicines for pain, discomfort, or fever as directed by your caregiver. Use cough suppressants only as directed by your caregiver.  Use a cold steam vaporizer or humidifier in your bedroom or home to help loosen secretions.  Sleep in a semi-upright position if your cough is worse at night.  Rest as needed.  Stop smoking if you smoke. SEEK IMMEDIATE MEDICAL CARE IF:   You have pus in your sputum.  Your cough starts to worsen.  You cannot control your cough with suppressants and are losing sleep.  You begin coughing up blood.  You have difficulty  breathing.  You develop pain which is getting worse or is uncontrolled with medicine.  You have a fever. MAKE SURE YOU:   Understand these instructions.  Will watch your condition.  Will get help right away if you are not doing well or get worse. Document Released: 05/25/2011 Document Revised: 02/18/2012 Document Reviewed: 05/25/2011 [facility] Patient Information 2015 [place], [place], [place]. This information is not intended to replace advice given to you by your health care provider. Make sure you discuss any questions you have with your health care provider.  Sinusitis Sinusitis is redness, soreness, and inflammation of the paranasal sinuses. Paranasal sinuses are air pockets within the bones of your face (beneath the eyes, the middle of the forehead, or above the eyes). In healthy paranasal sinuses, mucus is able to drain out, and air is able to circulate through them by way of your nose. However, when your paranasal sinuses are inflamed, mucus and air can become trapped. This can allow bacteria and other germs to grow and cause infection. Sinusitis can develop quickly and last only a short time (acute) or continue over a long period (chronic). Sinusitis that lasts for more than 12 weeks is considered chronic.  CAUSES  Causes of sinusitis include:  Allergies.  Structural abnormalities, such as displacement of the cartilage that separates your nostrils (deviated septum), which can decrease the air flow through your nose and sinuses and affect sinus drainage.  Functional abnormalities, such as when the small hairs (cilia) that line your sinuses and help remove mucus do not work properly or are not present. SIGNS  AND SYMPTOMS  Symptoms of acute and chronic sinusitis are the same. The primary symptoms are pain and pressure around the affected sinuses. Other symptoms include:  Upper toothache.  Earache.  Headache.  Bad breath.  Decreased sense of smell and taste.  A cough, which worsens  when you are lying flat.  Fatigue.  Fever.  Thick drainage from your nose, which often is green and may contain pus (purulent).  Swelling and warmth over the affected sinuses. DIAGNOSIS  Your health care provider will perform a physical exam. During the exam, your health care provider may:  Look in your nose for signs of abnormal growths in your nostrils (nasal polyps).  Tap over the affected sinus to check for signs of infection.  View the inside of your sinuses (endoscopy) using an imaging device that has a light attached (endoscope). If your health care provider suspects that you have chronic sinusitis, one or more of the following tests may be recommended:  Allergy tests.  Nasal culture. A sample of mucus is taken from your nose, sent to a lab, and screened for bacteria.  Nasal cytology. A sample of mucus is taken from your nose and examined by your health care provider to determine if your sinusitis is related to an allergy. TREATMENT  Most cases of acute sinusitis are related to a viral infection and will resolve on their own within 10 days. Sometimes medicines are prescribed to help relieve symptoms (pain medicine, decongestants, nasal steroid sprays, or saline sprays).  However, for sinusitis related to a bacterial infection, your health care provider will prescribe antibiotic medicines. These are medicines that will help kill the bacteria causing the infection.  Rarely, sinusitis is caused by a fungal infection. In theses cases, your health care provider will prescribe antifungal medicine. For some cases of chronic sinusitis, surgery is needed. Generally, these are cases in which sinusitis recurs more than 3 times per year, despite other treatments. HOME CARE INSTRUCTIONS   Drink plenty of water. Water helps thin the mucus so your sinuses can drain more easily.  Use a humidifier.  Inhale steam 3 to 4 times a day (for example, sit in the bathroom with the shower  running).  Apply a warm, moist washcloth to your face 3 to 4 times a day, or as directed by your health care provider.  Use saline nasal sprays to help moisten and clean your sinuses.  Take medicines only as directed by your health care provider.  If you were prescribed either an antibiotic or antifungal medicine, finish it all even if you start to feel better. SEEK IMMEDIATE MEDICAL CARE IF:  You have increasing pain or severe headaches.  You have nausea, vomiting, or drowsiness.  You have swelling around your face.  You have vision problems.  You have a stiff neck.  You have difficulty breathing. MAKE SURE YOU:   Understand these instructions.  Will watch your condition.  Will get help right away if you are not doing well or get worse. Document Released: 11/26/2005 Document Revised: 04/12/2014 Document Reviewed: 12/11/2011 [facility] Patient Information 2015 [place], [place]. This information is not intended to replace advice given to you by your health care provider. Make sure you discuss any questions you have with your health care provider.

## 2015-04-19 NOTE — ED Notes (Signed)
PA at bedside.

## 2015-04-19 NOTE — ED Provider Notes (Signed)
CSN: [id number]     Arrival date & time 04/19/15  1831 History   First MD Initiated Contact with Patient 04/19/15 2100     Chief Complaint  Patient presents with  . Cough    (Consider location/radiation/quality/duration/timing/severity/associated sxs/prior Treatment) HPI Comments: Patient is an 81 year old male with a history of CAD, hypertension, and prostate cancer. He presents to the emergency department for further evaluation of cough 1 week. He states that his cough is dry and nonproductive and has been worsening since onset. He has had associated sinus congestion as well as postnasal drip which has been contributing to some nausea which she experienced of the last 2 days. Patient reports a mild headache today. He endorses some mild shortness of breath at times which he describes more as a tightness in his chest. He denies a history of asthma as well as associated fever, sick contacts, chest pain, vomiting, diarrhea, and syncope.  PCP - [name]  Patient is a 81 y.o. male presenting with cough. The history is provided by the patient. No language interpreter was used.  Cough Associated symptoms: headaches and shortness of breath   Associated symptoms: no chest pain, no fever, no rhinorrhea, no sore throat and no wheezing     Past Medical History  Diagnosis Date  . CAD (coronary artery disease)   . Hypertension   . Kidney stones   . Prostate ca    Past Surgical History  Procedure Laterality Date  . Coronary artery bypass graft    . Prostate surgery    . Penile prosthesis implant    . Cardiac surgery     History reviewed. No pertinent family history. History  Substance Use Topics  . Smoking status: Never Smoker   . Smokeless tobacco: Never Used  . Alcohol Use: No    Review of Systems  Constitutional: Negative for fever.  HENT: Positive for congestion, postnasal drip and sinus pressure. Negative for rhinorrhea and sore throat.   Respiratory: Positive for cough, chest  tightness and shortness of breath. Negative for wheezing.   Cardiovascular: Negative for chest pain.  Gastrointestinal: Positive for nausea. Negative for vomiting and diarrhea.  Neurological: Positive for headaches.  All other systems reviewed and are negative.   Allergies  Review of patient's allergies indicates no known allergies.  Home Medications   Prior to Admission medications   Medication Sig Start Date End Date Taking? Authorizing Provider  aspirin EC 81 MG tablet Take 81 mg by mouth every morning.    Historical Provider, MD  fish oil-omega-3 fatty acids 1000 MG capsule Take 1 g by mouth daily.    Historical Provider, MD  fluticasone (FLONASE) 50 MCG/ACT nasal spray Place 2 sprays into the nose daily. 09/16/13   Hannah Muthersbaugh, PA-C  HYDROcodone-acetaminophen (NORCO/VICODIN) 5-325 MG per tablet Take 1 tablet by mouth every 4 (four) hours as needed. 02/15/14   Antonietta Breach, PA-C  metoprolol succinate (TOPROL-XL) 50 MG 24 hr tablet Take 50 mg by mouth daily. Take with or immediately following a meal.    Historical Provider, MD  naproxen sodium (ANAPROX) 220 MG tablet Take 220 mg by mouth daily as needed (pain).    Historical Provider, MD  olmesartan (BENICAR) 40 MG tablet Take 40 mg by mouth daily.    Historical Provider, MD  penicillin v potassium (VEETID) 500 MG tablet Take 1 tablet (500 mg total) by mouth 4 (four) times daily. 02/15/14   Antonietta Breach, PA-C  rosuvastatin (CRESTOR) 10 MG tablet Take 10 mg  by mouth at bedtime.    Historical Provider, MD   BP 121/75 mmHg  Pulse 60  Temp(Src) 98 F (36.7 C) (Oral)  Resp 16  Wt 290 lb (131.543 kg)  SpO2 95%   Physical Exam  Constitutional: He is oriented to person, place, and time. He appears well-developed and well-nourished. No distress.  Nontoxic/nonseptic appearing. Patient pleasant.  HENT:  Head: Normocephalic and atraumatic.  Right Ear: Tympanic membrane, external ear and ear canal normal.  Left Ear: Tympanic membrane,  external ear and ear canal normal.  Nose: Mucosal edema present. No rhinorrhea. Right sinus exhibits no maxillary sinus tenderness and no frontal sinus tenderness. Left sinus exhibits maxillary sinus tenderness and frontal sinus tenderness.  Mouth/Throat: Uvula is midline, oropharynx is clear and moist and mucous membranes are normal.  Audible nasal congestion with tenderness to the left frontal and maxillary sinuses.  Eyes: Conjunctivae and EOM are normal. Pupils are equal, round, and reactive to light. No scleral icterus.  Neck: Normal range of motion.  Cardiovascular: Normal rate, regular rhythm and intact distal pulses.   Pulmonary/Chest: Effort normal. No respiratory distress. He has wheezes. He has no rales.  Faint expiratory wheezing heard in the left lower lobe. No rales or rhonchi appreciated. No tachypnea or dyspnea. Chest expansion symmetric.  Musculoskeletal: Normal range of motion.  Neurological: He is alert and oriented to person, place, and time. He exhibits normal muscle tone. Coordination normal.  GCS 15. Speech is oriented. Patient moves extremities without ataxia.  Skin: Skin is warm and dry. No rash noted. He is not diaphoretic. No erythema. No pallor.  Psychiatric: He has a normal mood and affect. His behavior is normal.  Nursing note and vitals reviewed.   ED Course  Procedures (including critical care time) Labs Review Labs Reviewed  CBC - Abnormal; Notable for the following:    Hemoglobin 12.6 (*)    HCT 37.7 (*)    All other components within normal limits  BASIC METABOLIC PANEL - Abnormal; Notable for the following:    Glucose, Bld 104 (*)    BUN 23 (*)    Creatinine, Ser 1.26 (*)    GFR calc non Af Amer 51 (*)    GFR calc Af Amer 60 (*)    All other components within normal limits  Randolm Idol, ED    Imaging Review Dg Chest 2 View  04/19/2015   CLINICAL DATA:  Cough. Short of breath. Chest pain. Cough for 2 days.  EXAM: CHEST  2 VIEW  COMPARISON:   09/16/2013.  05/10/2013.  FINDINGS: Heart size upper limits of normal for projection. Tortuous thoracic aorta. CABG/ median sternotomy. Diffuse prominence of the interstitium. Basilar pulmonary parenchymal scarring. No airspace consolidation. Chronic blunting of the LEFT costophrenic angle. No pleural effusions.  IMPRESSION: No active cardiopulmonary disease.  No interval change in the chest.   Electronically Signed   By: Dereck Ligas [name]   On: 04/19/2015 19:12     EKG Interpretation   Date/Time:  Tuesday Apr 19 2015 18:37:21 EDT Ventricular Rate:  67 PR Interval:  162 QRS Duration: 154 QT Interval:  428 QTC Calculation: 452 R Axis:   -93 Text Interpretation:  Normal sinus rhythm Right bundle branch block , plus  right ventricular hypertrophy Abnormal ECG Sinus rhythm Non-specific  intra-ventricular conduction delay ST-t wave abnormality No significant  change since last tracing Abnormal ekg Confirmed by Carmin Muskrat  MD  ([id number]) on 04/19/2015 6:40:22 PM      MDM  Final diagnoses:  Acute sinusitis, recurrence not specified, unspecified location  Cough    Patient complaining of symptoms of sinusitis. Mild to moderate symptoms of nasal congestion, nausea, and cough for 1 week. Patient is afebrile. CXR negative for focal consolidation or PNA. He is able to ambulate in the ED without hypoxia. Labs c/w baseline; no leukocytosis. No concern for atypical ACS; troponin negative and EKG is stable compared to prior without new ischemic change. Believe patient is stable for discharge with outpatient management and PCP f/u. Will initiate abx therapy to cover for bacterial sinusitis, especially given age, as well as symptomatic treatment. Return precautions given. Patient agreeable to plan with no unaddressed concerns. Patient discharged in good condition.   Filed Vitals:   04/19/15 2145 04/19/15 2200 04/19/15 2215 04/19/15 2256  BP: 137/73 128/64 147/72 147/70  Pulse: 54 56 63 60  Temp:       TempSrc:      Resp: 9 12 16 16   Weight:      SpO2: 100% 97% 93% 100%      Antonietta Breach, PA-C 04/19/15 2258  Orlie Dakin, MD 04/20/15 0006

## 2015-04-19 NOTE — ED Provider Notes (Signed)
Complains of nonproductive cough and nasal congestion for the past 5 days. Coming symptoms include subjective fever and chills Feels much improved since here. Presently asymptomatic on exam no distress. Lungs clear to auscultation heart regular rate and rhythm abdomen obese nontender extremities without edema. And warm dry. Chest x-ray viewed by me Results for orders placed or performed during the hospital encounter of 04/19/15  CBC  Result Value Ref Range   WBC 8.0 4.0 - 10.5 K/uL   RBC 4.30 4.22 - 5.81 MIL/uL   Hemoglobin 12.6 (L) 13.0 - 17.0 g/dL   HCT 37.7 (L) 39.0 - 52.0 %   MCV 87.7 78.0 - 100.0 fL   MCH 29.3 26.0 - 34.0 pg   MCHC 33.4 30.0 - 36.0 g/dL   RDW 15.0 11.5 - 15.5 %   Platelets 180 150 - 400 K/uL  Basic metabolic panel  Result Value Ref Range   Sodium 140 135 - 145 mmol/L   Potassium 4.5 3.5 - 5.1 mmol/L   Chloride 107 101 - 111 mmol/L   CO2 22 22 - 32 mmol/L   Glucose, Bld 104 (H) 70 - 99 mg/dL   BUN 23 (H) 6 - 20 mg/dL   Creatinine, Ser 1.26 (H) 0.61 - 1.24 mg/dL   Calcium 9.5 8.9 - 10.3 mg/dL   GFR calc non Af Amer 51 (L) >60 mL/min   GFR calc Af Amer 60 (L) >60 mL/min   Anion gap 11 5 - 15  I-Stat Troponin, ED (not at [facility])  Result Value Ref Range   Troponin i, poc 0.00 0.00 - 0.08 ng/mL   Comment 3           Dg Chest 2 View  04/19/2015   CLINICAL DATA:  Cough. Short of breath. Chest pain. Cough for 2 days.  EXAM: CHEST  2 VIEW  COMPARISON:  09/16/2013.  05/10/2013.  FINDINGS: Heart size upper limits of normal for projection. Tortuous thoracic aorta. CABG/ median sternotomy. Diffuse prominence of the interstitium. Basilar pulmonary parenchymal scarring. No airspace consolidation. Chronic blunting of the LEFT costophrenic angle. No pleural effusions.  IMPRESSION: No active cardiopulmonary disease.  No interval change in the chest.   Electronically Signed   By: Dereck Ligas [name]   On: 04/19/2015 19:12      Orlie Dakin, MD 04/20/15 7824

## 2015-04-19 NOTE — ED Notes (Signed)
Pt in c/o cough and congestion for the last week, unsure of fever, pain in chest when coughing, no distress noted

## 2015-05-11 DIAGNOSIS — C679 Malignant neoplasm of bladder, unspecified: Secondary | ICD-10-CM

## 2015-05-11 HISTORY — DX: Malignant neoplasm of bladder, unspecified: C67.9

## 2015-07-21 ENCOUNTER — Encounter: Payer: Self-pay | Admitting: Podiatry

## 2015-07-21 ENCOUNTER — Ambulatory Visit (INDEPENDENT_AMBULATORY_CARE_PROVIDER_SITE_OTHER): Payer: Medicare Other | Admitting: Podiatry

## 2015-07-21 DIAGNOSIS — M79673 Pain in unspecified foot: Secondary | ICD-10-CM

## 2015-07-21 DIAGNOSIS — B351 Tinea unguium: Secondary | ICD-10-CM | POA: Diagnosis not present

## 2015-07-21 NOTE — Progress Notes (Signed)
Patient ID: [name], male   DOB: 1931/05/18, 81 y.o.   MRN: [id number] Complaint:  Visit Type: Patient returns to my office for continued preventative foot care services. Complaint: Patient states" my nails have grown long and thick and become painful to walk and wear shoes" . The patient presents for preventative foot care services. No changes to ROS  Podiatric Exam: Vascular: dorsalis pedis and posterior tibial pulses are palpable bilateral. Capillary return is immediate. Temperature gradient is WNL. Skin turgor WNL  Sensorium: Normal Semmes Weinstein monofilament test. Normal tactile sensation bilaterally. Nail Exam: Pt has thick disfigured discolored nails with subungual debris noted bilateral entire nail hallux through fifth toenails Ulcer Exam: There is no evidence of ulcer or pre-ulcerative changes or infection. Orthopedic Exam: Muscle tone and strength are WNL. No limitations in general ROM. No crepitus or effusions noted. Foot type and digits show no abnormalities. Bony prominences are unremarkable. Skin: No Porokeratosis. No infection or ulcers  Diagnosis:  Onychomycosis, , Pain in right toe, pain in left toes  Treatment & Plan Procedures and Treatment: Consent by patient was obtained for treatment procedures. The patient understood the discussion of treatment and procedures well. All questions were answered thoroughly reviewed. Debridement of mycotic and hypertrophic toenails, 1 through 5 bilateral and clearing of subungual debris. No ulceration, no infection noted.  Return Visit-Office Procedure: Patient instructed to return to the office for a follow up visit 3 months for continued evaluation and treatment.

## 2015-09-14 ENCOUNTER — Emergency Department (HOSPITAL_COMMUNITY)
Admission: EM | Admit: 2015-09-14 | Discharge: 2015-09-14 | Disposition: A | Discharge status: Home / Self Care | Payer: Medicare Other | Attending: Emergency Medicine | Admitting: Emergency Medicine

## 2015-09-14 ENCOUNTER — Encounter (HOSPITAL_COMMUNITY): Payer: Self-pay | Admitting: Neurology

## 2015-09-14 DIAGNOSIS — Z7951 Long term (current) use of inhaled steroids: Secondary | ICD-10-CM | POA: Diagnosis not present

## 2015-09-14 DIAGNOSIS — Z87442 Personal history of urinary calculi: Secondary | ICD-10-CM | POA: Insufficient documentation

## 2015-09-14 DIAGNOSIS — Z8546 Personal history of malignant neoplasm of prostate: Secondary | ICD-10-CM | POA: Insufficient documentation

## 2015-09-14 DIAGNOSIS — I10 Essential (primary) hypertension: Secondary | ICD-10-CM | POA: Diagnosis not present

## 2015-09-14 DIAGNOSIS — M62838 Other muscle spasm: Secondary | ICD-10-CM

## 2015-09-14 DIAGNOSIS — Z79899 Other long term (current) drug therapy: Secondary | ICD-10-CM | POA: Insufficient documentation

## 2015-09-14 DIAGNOSIS — Z7982 Long term (current) use of aspirin: Secondary | ICD-10-CM | POA: Diagnosis not present

## 2015-09-14 DIAGNOSIS — M542 Cervicalgia: Secondary | ICD-10-CM | POA: Diagnosis present

## 2015-09-14 DIAGNOSIS — I2510 Atherosclerotic heart disease of native coronary artery without angina pectoris: Secondary | ICD-10-CM | POA: Insufficient documentation

## 2015-09-14 MED ORDER — DIAZEPAM 5 MG PO TABS
5.0000 mg | ORAL_TABLET | Freq: Once | ORAL | Status: AC
  Administered 2015-09-14: 5 mg via ORAL
  Filled 2015-09-14: qty 1

## 2015-09-14 MED ORDER — DIAZEPAM 5 MG PO TABS: 5.0000 mg | ORAL_TABLET | Freq: Two times a day (BID) | ORAL | Status: DC

## 2015-09-14 NOTE — ED Notes (Addendum)
Pt reports 4 hrs ago was sitting when he developed sharp spasms to left side of his neck that radiates from back of his ear to back of his neck on left side that is worse when he turns his neck. He feels dizzy. Pain is intermittent. Pt is a x 4. Pain is severe when it comes. Denies cp.

## 2015-09-14 NOTE — ED Notes (Signed)
Warm pack placed to posterior neck with wash cloth barrier between skin

## 2015-09-14 NOTE — ED Provider Notes (Signed)
CSN: [id number]     Arrival date & time 09/14/15  1842 History   First MD Initiated Contact with Patient 09/14/15 2008     Chief Complaint  Patient presents with  . Neck Pain  . Spasms     (Consider location/radiation/quality/duration/timing/severity/associated sxs/prior Treatment) HPI Comments: Patient presents emergency department with chief complaint of left sided neck pain. Patient states that the symptoms started last night. He states that his symptoms are worsened with neck movement. He states that the side of his neck is very tender to palpation. He states that when he bends his neck towards the affected side, the pain becomes so severe that he feels dizzy. Patient denies any numbness, weakness, or tingling of his extremities. Denies any headache. Denies chest pain, or shortness of breath. He has not taken anything to alleviate his symptoms.  The history is provided by the patient. No language interpreter was used.    Past Medical History  Diagnosis Date  . CAD (coronary artery disease)   . Hypertension   . Kidney stones   . Prostate CA [facility])    Past Surgical History  Procedure Laterality Date  . Coronary artery bypass graft    . Prostate surgery    . Penile prosthesis implant    . Cardiac surgery     No family history on file. Social History  Substance Use Topics  . Smoking status: Never Smoker   . Smokeless tobacco: Never Used  . Alcohol Use: No    Review of Systems  Constitutional: Negative for fever and chills.  Respiratory: Negative for shortness of breath.   Cardiovascular: Negative for chest pain.  Gastrointestinal: Negative for nausea, vomiting, diarrhea and constipation.  Genitourinary: Negative for dysuria.  Musculoskeletal: Positive for neck pain.      Allergies  Review of patient's allergies indicates no known allergies.  Home Medications   Prior to Admission medications   Medication Sig Start Date End Date Taking? Authorizing Provider   amoxicillin-clavulanate (AUGMENTIN) 875-125 MG per tablet Take 1 tablet by mouth every 12 (twelve) hours. 04/19/15   Antonietta Breach, PA-C  aspirin EC 81 MG tablet Take 81 mg by mouth every morning.    Historical Provider, MD  benzonatate (TESSALON) 100 MG capsule Take 1 capsule (100 mg total) by mouth 3 (three) times daily as needed for cough. 04/19/15   Antonietta Breach, PA-C  fish oil-omega-3 fatty acids 1000 MG capsule Take 1 g by mouth daily.    Historical Provider, MD  fluticasone (FLONASE) 50 MCG/ACT nasal spray Place 2 sprays into both nostrils daily. 04/19/15   Antonietta Breach, PA-C  HYDROcodone-acetaminophen (NORCO/VICODIN) 5-325 MG per tablet Take 1 tablet by mouth every 4 (four) hours as needed. 02/15/14   Antonietta Breach, PA-C  metoprolol succinate (TOPROL-XL) 50 MG 24 hr tablet Take 50 mg by mouth daily. Take with or immediately following a meal.    Historical Provider, MD  naproxen sodium (ANAPROX) 220 MG tablet Take 220 mg by mouth daily as needed (pain).    Historical Provider, MD  olmesartan (BENICAR) 40 MG tablet Take 40 mg by mouth daily.    Historical Provider, MD  penicillin v potassium (VEETID) 500 MG tablet Take 1 tablet (500 mg total) by mouth 4 (four) times daily. 02/15/14   Antonietta Breach, PA-C  rosuvastatin (CRESTOR) 10 MG tablet Take 10 mg by mouth at bedtime.    Historical Provider, MD   BP 135/75 mmHg  Pulse 70  Temp(Src) 98.8 F (37.1 C) (Oral)  Resp 18  SpO2 97% Physical Exam  Constitutional: He is oriented to person, place, and time. He appears well-developed and well-nourished.  HENT:  Head: Normocephalic and atraumatic.  No occipital tenderness  Eyes: Conjunctivae and EOM are normal. Pupils are equal, round, and reactive to light. Right eye exhibits no discharge. Left eye exhibits no discharge. No scleral icterus.  Neck: Normal range of motion. Neck supple. No JVD present.  Cardiovascular: Normal rate, regular rhythm and normal heart sounds.  Exam reveals no gallop and no friction  rub.   No murmur heard. Pulmonary/Chest: Effort normal and breath sounds normal. No respiratory distress. He has no wheezes. He has no rales. He exhibits no tenderness.  Abdominal: Soft. He exhibits no distension and no mass. There is no tenderness. There is no rebound and no guarding.  Musculoskeletal: Normal range of motion. He exhibits no edema or tenderness.  Left sided upper trapezius is extremely tender to palpation, no bony abnormality about the cervical spine,   Neurological: He is alert and oriented to person, place, and time.  Skin: Skin is warm and dry.  No rash, no evidence of shingles,  Psychiatric: He has a normal mood and affect. His behavior is normal. Judgment and thought content normal.  Nursing note and vitals reviewed.   ED Course  Procedures (including critical care time) Results for orders placed or performed during the hospital encounter of 04/19/15  CBC  Result Value Ref Range   WBC 8.0 4.0 - 10.5 K/uL   RBC 4.30 4.22 - 5.81 MIL/uL   Hemoglobin 12.6 (L) 13.0 - 17.0 g/dL   HCT 37.7 (L) 39.0 - 52.0 %   MCV 87.7 78.0 - 100.0 fL   MCH 29.3 26.0 - 34.0 pg   MCHC 33.4 30.0 - 36.0 g/dL   RDW 15.0 11.5 - 15.5 %   Platelets 180 150 - 400 K/uL  Basic metabolic panel  Result Value Ref Range   Sodium 140 135 - 145 mmol/L   Potassium 4.5 3.5 - 5.1 mmol/L   Chloride 107 101 - 111 mmol/L   CO2 22 22 - 32 mmol/L   Glucose, Bld 104 (H) 70 - 99 mg/dL   BUN 23 (H) 6 - 20 mg/dL   Creatinine, Ser 1.26 (H) 0.61 - 1.24 mg/dL   Calcium 9.5 8.9 - 10.3 mg/dL   GFR calc non Af Amer 51 (L) >60 mL/min   GFR calc Af Amer 60 (L) >60 mL/min   Anion gap 11 5 - 15  I-Stat Troponin, ED (not at [facility])  Result Value Ref Range   Troponin i, poc 0.00 0.00 - 0.08 ng/mL   Comment 3           No results found.    MDM   Final diagnoses:  Neck muscle spasm    Patient symptoms consistent with neck muscle spasm.  Worsened with palpation over muscle body.  No neuro deficits.  Doubt  dissection.  No occipital tenderness, no occipital neuralgia.  Patient reassessed, significant improvement with Valium. Discharged home with Valium. Patient discussed with Dr. Wilson Singer, who agrees with the plan.    Montine Circle, PA-C 09/14/15 2215  Virgel Manifold, MD 09/15/15 [phone]

## 2015-09-14 NOTE — Discharge Instructions (Signed)
Heat Therapy °Heat therapy can help ease sore, stiff, injured, and tight muscles and joints. Heat relaxes your muscles, which may help ease your pain.  °RISKS AND COMPLICATIONS °If you have any of the following conditions, do not use heat therapy unless your health care provider has approved: °· Poor circulation. °· Healing wounds or scarred skin in the area being treated. °· Diabetes, heart disease, or high blood pressure. °· Not being able to feel (numbness) the area being treated. °· Unusual swelling of the area being treated. °· Active infections. °· Blood clots. °· Cancer. °· Inability to communicate pain. This may include young children and people who have problems with their brain function (dementia). °· Pregnancy. °Heat therapy should only be used on old, pre-existing, or long-lasting (chronic) injuries. Do not use heat therapy on new injuries unless directed by your health care provider. °HOW TO USE HEAT THERAPY °There are several different kinds of heat therapy, including: °· Moist heat pack. °· Warm water bath. °· Hot water bottle. °· Electric heating pad. °· Heated gel pack. °· Heated wrap. °· Electric heating pad. °Use the heat therapy method suggested by your health care provider. Follow your health care provider's instructions on when and how to use heat therapy. °GENERAL HEAT THERAPY RECOMMENDATIONS °· Do not sleep while using heat therapy. Only use heat therapy while you are awake. °· Your skin may turn pink while using heat therapy. Do not use heat therapy if your skin turns red. °· Do not use heat therapy if you have new pain. °· High heat or long exposure to heat can cause burns. Be careful when using heat therapy to avoid burning your skin. °· Do not use heat therapy on areas of your skin that are already irritated, such as with a rash or sunburn. °SEEK MEDICAL CARE IF: °· You have blisters, redness, swelling, or numbness. °· You have new pain. °· Your pain is worse. °MAKE SURE  YOU: °· Understand these instructions. °· Will watch your condition. °· Will get help right away if you are not doing well or get worse. °  °This information is not intended to replace advice given to you by your health care provider. Make sure you discuss any questions you have with your health care provider. °  °Document Released: 02/18/2012 Document Revised: 12/17/2014 Document Reviewed: 01/19/2014 °Elsevier Interactive Patient Education ©2016 [employer] ° °Muscle Cramps and Spasms °Muscle cramps and spasms occur when a muscle or muscles tighten and you have no control over this tightening (involuntary muscle contraction). They are a common problem and can develop in any muscle. The most common place is in the calf muscles of the leg. Both muscle cramps and muscle spasms are involuntary muscle contractions, but they also have differences:  °· Muscle cramps are sporadic and painful. They may last a few seconds to a quarter of an hour. Muscle cramps are often more forceful and last longer than muscle spasms. °· Muscle spasms may or may not be painful. They may also last just a few seconds or much longer. °CAUSES  °It is uncommon for cramps or spasms to be due to a serious underlying problem. In many cases, the cause of cramps or spasms is unknown. Some common causes are:  °· Overexertion.   °· Overuse from repetitive motions (doing the same thing over and over).   °· Remaining in a certain position for a long period of time.   °· Improper preparation, form, or technique while performing a sport or activity.   °·   Dehydration.   °· Injury.   °· Side effects of some medicines.   °· Abnormally low levels of the salts and ions in your blood (electrolytes), especially potassium and calcium. This could happen if you are taking water pills (diuretics) or you are pregnant.   °Some underlying medical problems can make it more likely to develop cramps or spasms. These include, but are not limited to:  °· Diabetes.    °· Parkinson disease.   °· Hormone disorders, such as thyroid problems.   °· Alcohol abuse.   °· Diseases specific to muscles, joints, and bones.   °· Blood vessel disease where not enough blood is getting to the muscles.   °HOME CARE INSTRUCTIONS  °· Stay well hydrated. Drink enough water and fluids to keep your urine clear or pale yellow. °· It may be helpful to massage, stretch, and relax the affected muscle. °· For tight or tense muscles, use a warm towel, heating pad, or hot shower water directed to the affected area. °· If you are sore or have pain after a cramp or spasm, applying ice to the affected area may relieve discomfort. °¨ Put ice in a plastic bag. °¨ Place a towel between your skin and the bag. °¨ Leave the ice on for 15-20 minutes, 03-04 times a day. °· Medicines used to treat a known cause of cramps or spasms may help reduce their frequency or severity. Only take over-the-counter or prescription medicines as directed by your caregiver. °SEEK MEDICAL CARE IF:  °Your cramps or spasms get more severe, more frequent, or do not improve over time.  °MAKE SURE YOU:  °· Understand these instructions. °· Will watch your condition. °· Will get help right away if you are not doing well or get worse. °  °This information is not intended to replace advice given to you by your health care provider. Make sure you discuss any questions you have with your health care provider. °  °Document Released: 05/18/2002 Document Revised: 03/23/2013 Document Reviewed: 11/12/2012 °Elsevier Interactive Patient Education ©2016 [employer] ° °

## 2015-09-16 ENCOUNTER — Emergency Department (HOSPITAL_COMMUNITY): Payer: Medicare Other

## 2015-09-16 ENCOUNTER — Emergency Department (HOSPITAL_COMMUNITY)
Admission: EM | Admit: 2015-09-16 | Discharge: 2015-09-16 | Disposition: A | Discharge status: Home / Self Care | Payer: Medicare Other | Attending: Emergency Medicine | Admitting: Emergency Medicine

## 2015-09-16 ENCOUNTER — Encounter (HOSPITAL_COMMUNITY): Payer: Self-pay | Admitting: Nurse Practitioner

## 2015-09-16 DIAGNOSIS — Z79899 Other long term (current) drug therapy: Secondary | ICD-10-CM | POA: Diagnosis not present

## 2015-09-16 DIAGNOSIS — M542 Cervicalgia: Secondary | ICD-10-CM | POA: Diagnosis present

## 2015-09-16 DIAGNOSIS — R51 Headache: Secondary | ICD-10-CM | POA: Diagnosis not present

## 2015-09-16 DIAGNOSIS — I10 Essential (primary) hypertension: Secondary | ICD-10-CM | POA: Diagnosis not present

## 2015-09-16 DIAGNOSIS — R42 Dizziness and giddiness: Secondary | ICD-10-CM | POA: Diagnosis not present

## 2015-09-16 DIAGNOSIS — I2510 Atherosclerotic heart disease of native coronary artery without angina pectoris: Secondary | ICD-10-CM | POA: Insufficient documentation

## 2015-09-16 DIAGNOSIS — Z87442 Personal history of urinary calculi: Secondary | ICD-10-CM | POA: Insufficient documentation

## 2015-09-16 DIAGNOSIS — Z7982 Long term (current) use of aspirin: Secondary | ICD-10-CM | POA: Insufficient documentation

## 2015-09-16 DIAGNOSIS — Z791 Long term (current) use of non-steroidal anti-inflammatories (NSAID): Secondary | ICD-10-CM | POA: Insufficient documentation

## 2015-09-16 DIAGNOSIS — Z8546 Personal history of malignant neoplasm of prostate: Secondary | ICD-10-CM | POA: Insufficient documentation

## 2015-09-16 DIAGNOSIS — R519 Headache, unspecified: Secondary | ICD-10-CM

## 2015-09-16 DIAGNOSIS — M62838 Other muscle spasm: Secondary | ICD-10-CM | POA: Insufficient documentation

## 2015-09-16 LAB — URINALYSIS, ROUTINE W REFLEX MICROSCOPIC
Bilirubin Urine: NEGATIVE
Glucose, UA: NEGATIVE mg/dL
Ketones, ur: NEGATIVE mg/dL
Nitrite: NEGATIVE
PROTEIN: NEGATIVE mg/dL
Specific Gravity, Urine: 1.016 (ref 1.005–1.030)
Urobilinogen, UA: 0.2 mg/dL (ref 0.0–1.0)
pH: 5.5 (ref 5.0–8.0)

## 2015-09-16 LAB — BASIC METABOLIC PANEL
ANION GAP: 9 (ref 5–15)
BUN: 26 mg/dL — ABNORMAL HIGH (ref 6–20)
CO2: 25 mmol/L (ref 22–32)
Calcium: 9.5 mg/dL (ref 8.9–10.3)
Chloride: 105 mmol/L (ref 101–111)
Creatinine, Ser: 1.37 mg/dL — ABNORMAL HIGH (ref 0.61–1.24)
GFR calc Af Amer: 54 mL/min — ABNORMAL LOW (ref >60)
GFR, EST NON AFRICAN AMERICAN: 46 mL/min — AB (ref >60)
GLUCOSE: 85 mg/dL (ref 65–99)
Potassium: 4.4 mmol/L (ref 3.5–5.1)
Sodium: 139 mmol/L (ref 135–145)

## 2015-09-16 LAB — CBC WITH DIFFERENTIAL/PLATELET
BASOS ABS: 0 10*3/uL (ref 0.0–0.1)
Basophils Relative: 0 %
Eosinophils Absolute: 0.4 10*3/uL (ref 0.0–0.7)
Eosinophils Relative: 4 %
HEMATOCRIT: 40.1 % (ref 39.0–52.0)
Hemoglobin: 12.9 g/dL — ABNORMAL LOW (ref 13.0–17.0)
LYMPHS PCT: 21 %
Lymphs Abs: 2.1 10*3/uL (ref 0.7–4.0)
MCH: 29 pg (ref 26.0–34.0)
MCHC: 32.2 g/dL (ref 30.0–36.0)
MCV: 90.1 fL (ref 78.0–100.0)
Monocytes Absolute: 0.9 10*3/uL (ref 0.1–1.0)
Monocytes Relative: 9 %
NEUTROS ABS: 6.6 10*3/uL (ref 1.7–7.7)
Neutrophils Relative %: 66 %
Platelets: 181 10*3/uL (ref 150–400)
RBC: 4.45 MIL/uL (ref 4.22–5.81)
RDW: 15 % (ref 11.5–15.5)
WBC: 9.9 10*3/uL (ref 4.0–10.5)

## 2015-09-16 LAB — URINE MICROSCOPIC-ADD ON

## 2015-09-16 LAB — PROTIME-INR
INR: 1.17 (ref 0.00–1.49)
Prothrombin Time: 15 seconds (ref 11.6–15.2)

## 2015-09-16 MED ORDER — CYCLOBENZAPRINE HCL 5 MG PO TABS: 10.0000 mg | ORAL_TABLET | Freq: Two times a day (BID) | ORAL | Status: DC | PRN

## 2015-09-16 MED ORDER — DIAZEPAM 5 MG PO TABS
5.0000 mg | ORAL_TABLET | Freq: Once | ORAL | Status: AC
  Administered 2015-09-16: 5 mg via ORAL
  Filled 2015-09-16: qty 1

## 2015-09-16 MED ORDER — CYCLOBENZAPRINE HCL 10 MG PO TABS
5.0000 mg | ORAL_TABLET | Freq: Once | ORAL | Status: AC
  Administered 2015-09-16: 5 mg via ORAL
  Filled 2015-09-16: qty 1

## 2015-09-16 MED ORDER — HYDROCODONE-ACETAMINOPHEN 5-325 MG PO TABS: 1.0000 | ORAL_TABLET | Freq: Four times a day (QID) | ORAL | Status: DC | PRN

## 2015-09-16 MED ORDER — IOHEXOL 350 MG/ML SOLN
100.0000 mL | Freq: Once | INTRAVENOUS | Status: AC | PRN
  Administered 2015-09-16: 100 mL via INTRAVENOUS

## 2015-09-16 MED ORDER — HYDROCODONE-ACETAMINOPHEN 5-325 MG PO TABS
1.0000 | ORAL_TABLET | Freq: Once | ORAL | Status: AC
  Administered 2015-09-16: 1 via ORAL
  Filled 2015-09-16: qty 1

## 2015-09-16 NOTE — ED Notes (Signed)
He c/o several day hx posterior neck pain and headache. Neck is painful to palpation. Denies any injuries. He is A&Ox4, resp e/u

## 2015-09-16 NOTE — Discharge Instructions (Signed)

## 2015-09-16 NOTE — ED Provider Notes (Signed)
CSN: [id number]     Arrival date & time 09/16/15  1447 History   First MD Initiated Contact with Patient 09/16/15 1524     Chief Complaint  Patient presents with  . Neck Pain   [name] is a 81 y.o. male prostate cancer, and history of neck surgery who presents with neck pain, dizziness, and headache. The patient reports that for the last 3 days, he has had significant left-sided neck pain and headaches. The patient said that this started after he finished mowing his yard 3 days ago. The patient denies a specific twisting or injury mechanism however, the patient has had persistent symptoms and that time. The patient reports that he was evaluated 2 days ago and was diagnosed with muscle spasms after his pain improved with Valium. The patient reports that when he turns his neck to the left, he begins feeling dizzy and lightheaded. The patient describes his pain as a 9 out of 10 in severity, located in his left neck radiating up towards his occiput. The patient says the pain is constant. The patient says he is having headache but otherwise denies vision changes, nausea, vomiting, fevers, chills, or any other neurological complaints. The patient denies any numbness tingling or weakness in any extremity. The patient denies any chest pain, shortness of breath, abdominal pain, constipation, diarrhea. The patient does report dysuria and is currently being worked up for hematuria. The patient reports that urologist going to do a cystoscopy in the next several weeks.    (Consider location/radiation/quality/duration/timing/severity/associated sxs/prior Treatment) Patient is a 81 y.o. male presenting with headaches. The history is provided by the patient, medical records and the spouse. No language interpreter was used.  Headache Pain location:  Occipital Quality:  Dull Radiates to:  L neck Severity currently:  9/10 Severity at highest:  10/10 Onset quality:  Sudden Duration:  3 days Timing:   Constant Progression:  Waxing and waning Chronicity:  New Similar to prior headaches: no   Relieved by:  Nothing Worsened by:  Neck movement Ineffective treatments:  None tried Associated symptoms: dizziness and neck pain   Associated symptoms: no abdominal pain, no back pain, no blurred vision, no congestion, no cough, no diarrhea, no eye pain, no fatigue, no fever, no loss of balance, no nausea, no near-syncope, no neck stiffness, no numbness, no paresthesias, no photophobia, no sore throat, no tingling, no URI, no vomiting and no weakness     Past Medical History  Diagnosis Date  . CAD (coronary artery disease)   . Hypertension   . Kidney stones   . Prostate CA [facility])    Past Surgical History  Procedure Laterality Date  . Coronary artery bypass graft    . Prostate surgery    . Penile prosthesis implant    . Cardiac surgery     History reviewed. No pertinent family history. Social History  Substance Use Topics  . Smoking status: Never Smoker   . Smokeless tobacco: Never Used  . Alcohol Use: No    Review of Systems  Constitutional: Negative for fever, chills, diaphoresis and fatigue.  HENT: Negative for congestion, rhinorrhea and sore throat.   Eyes: Negative for blurred vision, photophobia, pain and visual disturbance.  Respiratory: Negative for cough, chest tightness, shortness of breath, wheezing and stridor.   Cardiovascular: Negative for chest pain, palpitations and near-syncope.  Gastrointestinal: Negative for nausea, vomiting, abdominal pain, diarrhea and constipation.  Genitourinary: Positive for dysuria.  Musculoskeletal: Positive for neck pain. Negative for back  pain, gait problem and neck stiffness.  Skin: Negative for rash and wound.  Neurological: Positive for dizziness, light-headedness and headaches. Negative for weakness, numbness, paresthesias and loss of balance.  Psychiatric/Behavioral: Negative for agitation.  All other systems reviewed and are  negative.     Allergies  Other  Home Medications   Prior to Admission medications   Medication Sig Start Date End Date Taking? Authorizing Provider  amoxicillin-clavulanate (AUGMENTIN) 875-125 MG per tablet Take 1 tablet by mouth every 12 (twelve) hours. 04/19/15   Antonietta Breach, PA-C  aspirin EC 81 MG tablet Take 81 mg by mouth every morning.    Historical Provider, MD  benzonatate (TESSALON) 100 MG capsule Take 1 capsule (100 mg total) by mouth 3 (three) times daily as needed for cough. 04/19/15   Antonietta Breach, PA-C  diazepam (VALIUM) 5 MG tablet Take 1 tablet (5 mg total) by mouth 2 (two) times daily. 09/14/15   Montine Circle, PA-C  fluticasone (FLONASE) 50 MCG/ACT nasal spray Place 2 sprays into both nostrils daily. 04/19/15   Antonietta Breach, PA-C  HYDROcodone-acetaminophen (NORCO/VICODIN) 5-325 MG per tablet Take 1 tablet by mouth every 4 (four) hours as needed. 02/15/14   Antonietta Breach, PA-C  metoprolol succinate (TOPROL-XL) 50 MG 24 hr tablet Take 50 mg by mouth daily. Take with or immediately following a meal.    Historical Provider, MD  naproxen sodium (ANAPROX) 220 MG tablet Take 220 mg by mouth daily as needed (pain).    Historical Provider, MD  nitroGLYCERIN (NITRODUR - DOSED IN MG/24 HR) 0.4 mg/hr patch Place 0.4 mg onto the skin at bedtime.  08/08/15   Historical Provider, MD  olmesartan (BENICAR) 40 MG tablet Take 40 mg by mouth daily.    Historical Provider, MD  penicillin v potassium (VEETID) 500 MG tablet Take 1 tablet (500 mg total) by mouth 4 (four) times daily. 02/15/14   Antonietta Breach, PA-C  rosuvastatin (CRESTOR) 10 MG tablet Take 10 mg by mouth at bedtime.    Historical Provider, MD   BP 111/64 mmHg  Pulse 64  Temp(Src) 97.3 F (36.3 C) (Oral)  Resp 18  Ht 6\' 3"  (1.905 m)  Wt 310 lb (140.615 kg)  BMI 38.75 kg/m2  SpO2 98% Physical Exam  Constitutional: He is oriented to person, place, and time. He appears well-developed and well-nourished. No distress.  HENT:  Head:  Normocephalic and atraumatic.  Mouth/Throat: No oropharyngeal exudate.  Eyes: Conjunctivae and EOM are normal.  Neck: Muscular tenderness present.    Cardiovascular: Normal rate, normal heart sounds and intact distal pulses.   No murmur heard. Pulmonary/Chest: Effort normal and breath sounds normal. No stridor. No respiratory distress. He exhibits no tenderness.  Abdominal: Soft. He exhibits no distension. There is no tenderness. There is no rebound.  Musculoskeletal: He exhibits tenderness.  Neurological: He is alert and oriented to person, place, and time. He displays normal reflexes. No cranial nerve deficit or sensory deficit. He exhibits normal muscle tone. Coordination normal. GCS eye subscore is 4. GCS verbal subscore is 5. GCS motor subscore is 6.  Skin: Skin is warm. He is not diaphoretic. No erythema.  Nursing note and vitals reviewed.   ED Course  Procedures (including critical care time) Labs Review Labs Reviewed  CBC WITH DIFFERENTIAL/PLATELET - Abnormal; Notable for the following:    Hemoglobin 12.9 (*)    All other components within normal limits  BASIC METABOLIC PANEL - Abnormal; Notable for the following:    BUN 26 (*)  Creatinine, Ser 1.37 (*)    GFR calc non Af Amer 46 (*)    GFR calc Af Amer 54 (*)    All other components within normal limits  URINALYSIS, ROUTINE W REFLEX MICROSCOPIC (NOT AT [facility]) - Abnormal; Notable for the following:    APPearance CLOUDY (*)    Hgb urine dipstick LARGE (*)    Leukocytes, UA SMALL (*)    All other components within normal limits  URINE CULTURE  PROTIME-INR  URINE MICROSCOPIC-ADD ON    Imaging Review Ct Angio Head W/cm &/or Wo Cm  09/16/2015   CLINICAL DATA:  Severe left neck pain extending into the left shoulder for 2 days. Headache. Symptoms present for several days.  EXAM: CT ANGIOGRAPHY HEAD AND NECK  TECHNIQUE: Multidetector CT imaging of the head and neck was performed using the standard protocol during bolus  administration of intravenous contrast. Multiplanar CT image reconstructions and MIPs were obtained to evaluate the vascular anatomy. Carotid stenosis measurements (when applicable) are obtained utilizing NASCET criteria, using the distal internal carotid diameter as the denominator.  CONTRAST:  11mL OMNIPAQUE IOHEXOL 350 MG/ML SOLN  COMPARISON:  Head CT 05/10/2013  FINDINGS: CT HEAD  Brain: There is no evidence of acute cortical infarct, intracranial hemorrhage, mass, midline shift, or extra-axial fluid collection. Mild generalized cerebral atrophy is unchanged.  Calvarium and skull base: No skull fracture or destructive osseous lesion.  Paranasal sinuses: Mild right maxillary sinus mucosal thickening. Chronic, small left mastoid effusion.  Orbits: Unremarkable.  CTA NECK  Aortic arch: 3 vessel aortic arch. Brachiocephalic and subclavian arteries appear patent without significant stenosis. Minimal calcified plaque is noted at the left subclavian artery origin.  Right carotid system: Patent without evidence of stenosis, dissection, or aneurysm. Medialized course of the common carotid artery.  Left carotid system: Patent without evidence of stenosis, dissection, or aneurysm. Medialized course of the distal common carotid artery.  Vertebral arteries: Evaluation of the vertebral arteries is partially limited by patient body habitus and suboptimal contrast timing. The proximal left V1 segment is obscured by streak artifact from adjacent dense venous contrast. Portions of both V2 segments are also suboptimally evaluated. Both vertebral arteries appear patent and codominant without gross stenosis or dissection identified.  Skeleton: There is advanced cervical disc degeneration and left-sided upper cervical facet arthrosis. Intervertebral osseous fusion is present from C3 to C7, with flowing anterior ossification continuing into the upper thoracic spine as well.  Other neck: Mild paraseptal emphysematous changes are noted  in the right greater than left lung apices. Sequelae of prior CABG are partially visualized.  CTA HEAD  Examination is partially limited by suboptimal contrast timing and image noise, with branch vessel evaluation primarily affected.  Anterior circulation: Internal carotid arteries are patent from skullbase to carotid termini. There is moderate carotid siphon calcification bilaterally with likely moderate right and mild left cavernous carotid stenosis. M1 segments are patent without evidence of significant stenosis. There is suggestion of a moderate left M2 stenosis at the bifurcation. MCA branch vessels are suboptimally evaluated, without evidence of major proximal branch vessel occlusion. The left A1 segment is slightly dominant with mild-to-moderate bilateral A1 irregularity which is likely at least partially artifactual. A2 segments are patent with evidence of moderate irregularity. No intracranial aneurysm is identified.  Posterior circulation: Intracranial vertebral arteries are patent to the basilar. There is mild left V4 calcified plaque without significant stenosis. The basilar artery is patent without significant stenosis. SCAs are not well evaluated. PCAs are patent with  note made of a tortuous left P1 segment. There is a moderate proximal to mid right P2 stenosis. PCAs distal to the mid P2 level are inadequately evaluated.  Venous sinuses: Patent.  Anatomic variants: None.  Delayed phase: No abnormal enhancement.  IMPRESSION: 1. Suboptimal examination due to patient body habitus and contrast timing, mainly will limiting evaluation of the proximal vertebral arteries and intracranial branch vessels. 2. Unremarkable appearance of the cervical carotid arteries. 3. Intracranial ICA atherosclerosis with likely moderate right and mild left cavernous stenosis. 4. Moderate left M2 stenosis at the MCA bifurcation. 5. Moderate right P2 PCA stenosis. 6. No evidence of acute intracranial abnormality.   Electronically  Signed   By: Logan Bores [name]   On: 09/16/2015 19:33   Ct Angio Neck W/cm &/or Wo/cm  09/16/2015   CLINICAL DATA:  Severe left neck pain extending into the left shoulder for 2 days. Headache. Symptoms present for several days.  EXAM: CT ANGIOGRAPHY HEAD AND NECK  TECHNIQUE: Multidetector CT imaging of the head and neck was performed using the standard protocol during bolus administration of intravenous contrast. Multiplanar CT image reconstructions and MIPs were obtained to evaluate the vascular anatomy. Carotid stenosis measurements (when applicable) are obtained utilizing NASCET criteria, using the distal internal carotid diameter as the denominator.  CONTRAST:  121mL OMNIPAQUE IOHEXOL 350 MG/ML SOLN  COMPARISON:  Head CT 05/10/2013  FINDINGS: CT HEAD  Brain: There is no evidence of acute cortical infarct, intracranial hemorrhage, mass, midline shift, or extra-axial fluid collection. Mild generalized cerebral atrophy is unchanged.  Calvarium and skull base: No skull fracture or destructive osseous lesion.  Paranasal sinuses: Mild right maxillary sinus mucosal thickening. Chronic, small left mastoid effusion.  Orbits: Unremarkable.  CTA NECK  Aortic arch: 3 vessel aortic arch. Brachiocephalic and subclavian arteries appear patent without significant stenosis. Minimal calcified plaque is noted at the left subclavian artery origin.  Right carotid system: Patent without evidence of stenosis, dissection, or aneurysm. Medialized course of the common carotid artery.  Left carotid system: Patent without evidence of stenosis, dissection, or aneurysm. Medialized course of the distal common carotid artery.  Vertebral arteries: Evaluation of the vertebral arteries is partially limited by patient body habitus and suboptimal contrast timing. The proximal left V1 segment is obscured by streak artifact from adjacent dense venous contrast. Portions of both V2 segments are also suboptimally evaluated. Both vertebral arteries  appear patent and codominant without gross stenosis or dissection identified.  Skeleton: There is advanced cervical disc degeneration and left-sided upper cervical facet arthrosis. Intervertebral osseous fusion is present from C3 to C7, with flowing anterior ossification continuing into the upper thoracic spine as well.  Other neck: Mild paraseptal emphysematous changes are noted in the right greater than left lung apices. Sequelae of prior CABG are partially visualized.  CTA HEAD  Examination is partially limited by suboptimal contrast timing and image noise, with branch vessel evaluation primarily affected.  Anterior circulation: Internal carotid arteries are patent from skullbase to carotid termini. There is moderate carotid siphon calcification bilaterally with likely moderate right and mild left cavernous carotid stenosis. M1 segments are patent without evidence of significant stenosis. There is suggestion of a moderate left M2 stenosis at the bifurcation. MCA branch vessels are suboptimally evaluated, without evidence of major proximal branch vessel occlusion. The left A1 segment is slightly dominant with mild-to-moderate bilateral A1 irregularity which is likely at least partially artifactual. A2 segments are patent with evidence of moderate irregularity. No intracranial aneurysm is identified.  Posterior circulation: Intracranial vertebral arteries are patent to the basilar. There is mild left V4 calcified plaque without significant stenosis. The basilar artery is patent without significant stenosis. SCAs are not well evaluated. PCAs are patent with note made of a tortuous left P1 segment. There is a moderate proximal to mid right P2 stenosis. PCAs distal to the mid P2 level are inadequately evaluated.  Venous sinuses: Patent.  Anatomic variants: None.  Delayed phase: No abnormal enhancement.  IMPRESSION: 1. Suboptimal examination due to patient body habitus and contrast timing, mainly will limiting  evaluation of the proximal vertebral arteries and intracranial branch vessels. 2. Unremarkable appearance of the cervical carotid arteries. 3. Intracranial ICA atherosclerosis with likely moderate right and mild left cavernous stenosis. 4. Moderate left M2 stenosis at the MCA bifurcation. 5. Moderate right P2 PCA stenosis. 6. No evidence of acute intracranial abnormality.   Electronically Signed   By: Logan Bores [name]   On: 09/16/2015 19:33   I have personally reviewed and evaluated these images and lab results as part of my medical decision-making.   EKG Interpretation   Date/Time:  Friday September 16 2015 15:36:39 EDT Ventricular Rate:  63 PR Interval:  184 QRS Duration: 145 QT Interval:  440 QTC Calculation: 450 R Axis:   94 Text Interpretation:  Sinus rhythm Right bundle branch block similar  previous Confirmed by ZAVITZ  MD, JOSHUA ([id number]) on 09/16/2015 3:43:31 PM      MDM   [name] is a 81 y.o. male prostate cancer, and history of neck surgery who presents with neck pain, dizziness, and headache. The patient reports that he was recently evaluated for similar symptoms and Diagnosed with muscle spasm of the neck. The patient says that Valium helped his symptoms however, he ran out of pills and has had return of symptoms. Given the patient's history of neck surgery as well as his age and description of symptoms, concern for possible vertebral artery injury or stroke. The patient had diagnostic imaging studies of the head and neck to investigate. The patient CT scan showed Several areas of arterial stenosis however, there did not appear to be any acute injury or intracranial abnormality.The patient's urinalysis revealed known hematuria and the patient is scheduled to have a cystoscopy in the next week. The patient's other laboratory tests did not appear to be significantly changed from prior.  The patient was given Valium during his initial workup. The patient reported mild symptom  improvement however, the patient was then given Norco and Flexeril which he reports nearly completely resolved his symptoms.  The patient was reassured about his imaging and laboratory workup and given his exam, the patient's symptoms are felt to be related to muscle spasm as previously diagnosed.  The patient was given prescriptions for Norco and Flexeril and the patient was instructed to follow up with his PCP in the next several days. The patient and his wife voiced understanding of the plan of care, had no other questions, concerns, or complaints, and the patient was discharged in good condition.  This patient was seen with Dr. Reather Converse, emergency medicine attending.     Final diagnoses:  Headache  Muscle spasms of neck       Antony Blackbird, MD 09/17/15 [id number]  Elnora Morrison, MD 09/17/15 [id number]

## 2015-09-18 LAB — URINE CULTURE

## 2015-09-29 ENCOUNTER — Encounter: Payer: Medicare Other | Admitting: Podiatry

## 2015-09-29 NOTE — Progress Notes (Deleted)
Patient ID: [name], male   DOB: 04/12/1931, 81 y.o.   MRN: [id number] Complaint:  Visit Type: Patient returns to my office for continued preventative foot care services. Complaint: Patient states" my nails have grown long and thick and become painful to walk and wear shoes" . The patient presents for preventative foot care services. No changes to ROS  Podiatric Exam: Vascular: dorsalis pedis and posterior tibial pulses are palpable bilateral. Capillary return is immediate. Temperature gradient is WNL. Skin turgor WNL  Sensorium: Normal Semmes Weinstein monofilament test. Normal tactile sensation bilaterally. Nail Exam: Pt has thick disfigured discolored nails with subungual debris noted bilateral entire nail hallux through fifth toenails Ulcer Exam: There is no evidence of ulcer or pre-ulcerative changes or infection. Orthopedic Exam: Muscle tone and strength are WNL. No limitations in general ROM. No crepitus or effusions noted. Foot type and digits show no abnormalities. Bony prominences are unremarkable. Skin: No Porokeratosis. No infection or ulcers  Diagnosis:  Onychomycosis, , Pain in right toe, pain in left toes  Treatment & Plan Procedures and Treatment: Consent by patient was obtained for treatment procedures. The patient understood the discussion of treatment and procedures well. All questions were answered thoroughly reviewed. Debridement of mycotic and hypertrophic toenails, 1 through 5 bilateral and clearing of subungual debris. No ulceration, no infection noted.  Return Visit-Office Procedure: Patient instructed to return to the office for a follow up visit 3 months for continued evaluation and treatment. 

## 2015-09-30 NOTE — Progress Notes (Signed)
This encounter was created in error - please disregard.  This encounter was created in error - please disregard.

## 2015-11-09 ENCOUNTER — Ambulatory Visit (INDEPENDENT_AMBULATORY_CARE_PROVIDER_SITE_OTHER): Payer: Medicare Other | Admitting: Podiatry

## 2015-11-09 ENCOUNTER — Encounter: Payer: Self-pay | Admitting: Podiatry

## 2015-11-09 DIAGNOSIS — M79673 Pain in unspecified foot: Secondary | ICD-10-CM

## 2015-11-09 DIAGNOSIS — B351 Tinea unguium: Secondary | ICD-10-CM

## 2015-11-09 NOTE — Progress Notes (Signed)
Patient ID: [name], male   DOB: 05/23/1931, 81 y.o.   MRN: [id number] Complaint:  Visit Type: Patient returns to my office for continued preventative foot care services. Complaint: Patient states" my nails have grown long and thick and become painful to walk and wear shoes" . The patient presents for preventative foot care services. No changes to ROS  Podiatric Exam: Vascular: dorsalis pedis and posterior tibial pulses are palpable bilateral. Capillary return is immediate. Temperature gradient is WNL. Skin turgor WNL  Sensorium: Normal Semmes Weinstein monofilament test. Normal tactile sensation bilaterally. Nail Exam: Pt has thick disfigured discolored nails with subungual debris noted bilateral entire nail hallux through fifth toenails Ulcer Exam: There is no evidence of ulcer or pre-ulcerative changes or infection. Orthopedic Exam: Muscle tone and strength are WNL. No limitations in general ROM. No crepitus or effusions noted. Foot type and digits show no abnormalities. Bony prominences are unremarkable. Skin: No Porokeratosis. No infection or ulcers  Diagnosis:  Onychomycosis, , Pain in right toe, pain in left toes  Treatment & Plan Procedures and Treatment: Consent by patient was obtained for treatment procedures. The patient understood the discussion of treatment and procedures well. All questions were answered thoroughly reviewed. Debridement of mycotic and hypertrophic toenails, 1 through 5 bilateral and clearing of subungual debris. No ulceration, no infection noted.  Return Visit-Office Procedure: Patient instructed to return to the office for a follow up visit 3 months for continued evaluation and treatment.  Gardiner Barefoot DPM

## 2016-01-19 ENCOUNTER — Ambulatory Visit: Payer: Medicare Other | Admitting: Podiatry

## 2016-03-01 ENCOUNTER — Ambulatory Visit (INDEPENDENT_AMBULATORY_CARE_PROVIDER_SITE_OTHER): Payer: Medicare Other | Admitting: Podiatry

## 2016-03-01 DIAGNOSIS — M79673 Pain in unspecified foot: Secondary | ICD-10-CM

## 2016-03-01 DIAGNOSIS — B351 Tinea unguium: Secondary | ICD-10-CM | POA: Diagnosis not present

## 2016-03-01 NOTE — Progress Notes (Signed)
Patient ID: [name], male   DOB: 1931/02/09, 82 y.o.   MRN: [id number] Complaint:  Visit Type: Patient returns to my office for continued preventative foot care services. Complaint: Patient states" my nails have grown long and thick and become painful to walk and wear shoes" . The patient presents for preventative foot care services. No changes to ROS  Podiatric Exam: Vascular: dorsalis pedis and posterior tibial pulses are palpable bilateral. Capillary return is immediate. Temperature gradient is WNL. Skin turgor WNL  Sensorium: Normal Semmes Weinstein monofilament test. Normal tactile sensation bilaterally. Nail Exam: Pt has thick disfigured discolored nails with subungual debris noted bilateral entire nail hallux through fifth toenails Ulcer Exam: There is no evidence of ulcer or pre-ulcerative changes or infection. Orthopedic Exam: Muscle tone and strength are WNL. No limitations in general ROM. No crepitus or effusions noted. Foot type and digits show no abnormalities. Bony prominences are unremarkable. Skin: No Porokeratosis. No infection or ulcers  Diagnosis:  Onychomycosis, , Pain in right toe, pain in left toes  Treatment & Plan Procedures and Treatment: Consent by patient was obtained for treatment procedures. The patient understood the discussion of treatment and procedures well. All questions were answered thoroughly reviewed. Debridement of mycotic and hypertrophic toenails, 1 through 5 bilateral and clearing of subungual debris. No ulceration, no infection noted.  Return Visit-Office Procedure: Patient instructed to return to the office for a follow up visit 3 months for continued evaluation and treatment.  Gardiner Barefoot DPM

## 2016-05-09 ENCOUNTER — Ambulatory Visit: Payer: Medicare Other | Admitting: Podiatry

## 2016-05-10 ENCOUNTER — Encounter: Payer: Self-pay | Admitting: Podiatry

## 2016-05-10 ENCOUNTER — Ambulatory Visit (INDEPENDENT_AMBULATORY_CARE_PROVIDER_SITE_OTHER): Payer: Medicare Other | Admitting: Podiatry

## 2016-05-10 DIAGNOSIS — M79673 Pain in unspecified foot: Secondary | ICD-10-CM

## 2016-05-10 DIAGNOSIS — B351 Tinea unguium: Secondary | ICD-10-CM | POA: Diagnosis not present

## 2016-05-10 NOTE — Progress Notes (Signed)
Patient ID: [name], male   DOB: 1931/02/09, 82 y.o.   MRN: [id number] Complaint:  Visit Type: Patient returns to my office for continued preventative foot care services. Complaint: Patient states" my nails have grown long and thick and become painful to walk and wear shoes" . The patient presents for preventative foot care services. No changes to ROS  Podiatric Exam: Vascular: dorsalis pedis and posterior tibial pulses are palpable bilateral. Capillary return is immediate. Temperature gradient is WNL. Skin turgor WNL  Sensorium: Normal Semmes Weinstein monofilament test. Normal tactile sensation bilaterally. Nail Exam: Pt has thick disfigured discolored nails with subungual debris noted bilateral entire nail hallux through fifth toenails Ulcer Exam: There is no evidence of ulcer or pre-ulcerative changes or infection. Orthopedic Exam: Muscle tone and strength are WNL. No limitations in general ROM. No crepitus or effusions noted. Foot type and digits show no abnormalities. Bony prominences are unremarkable. Skin: No Porokeratosis. No infection or ulcers  Diagnosis:  Onychomycosis, , Pain in right toe, pain in left toes  Treatment & Plan Procedures and Treatment: Consent by patient was obtained for treatment procedures. The patient understood the discussion of treatment and procedures well. All questions were answered thoroughly reviewed. Debridement of mycotic and hypertrophic toenails, 1 through 5 bilateral and clearing of subungual debris. No ulceration, no infection noted.  Return Visit-Office Procedure: Patient instructed to return to the office for a follow up visit 3 months for continued evaluation and treatment.  Gardiner Barefoot DPM

## 2016-07-28 ENCOUNTER — Encounter (HOSPITAL_COMMUNITY): Payer: Self-pay

## 2016-07-28 ENCOUNTER — Inpatient Hospital Stay (HOSPITAL_COMMUNITY)
Admission: EM | Admit: 2016-07-28 | Discharge: 2016-08-02 | DRG: 683 | Disposition: A | Discharge status: Rehabilitation Facility | Payer: Medicare Other | Attending: Cardiology | Admitting: Cardiology

## 2016-07-28 DIAGNOSIS — N320 Bladder-neck obstruction: Secondary | ICD-10-CM | POA: Diagnosis present

## 2016-07-28 DIAGNOSIS — I10 Essential (primary) hypertension: Secondary | ICD-10-CM

## 2016-07-28 DIAGNOSIS — Z8546 Personal history of malignant neoplasm of prostate: Secondary | ICD-10-CM

## 2016-07-28 DIAGNOSIS — D649 Anemia, unspecified: Secondary | ICD-10-CM

## 2016-07-28 DIAGNOSIS — M25571 Pain in right ankle and joints of right foot: Secondary | ICD-10-CM

## 2016-07-28 DIAGNOSIS — Z87442 Personal history of urinary calculi: Secondary | ICD-10-CM

## 2016-07-28 DIAGNOSIS — N182 Chronic kidney disease, stage 2 (mild): Secondary | ICD-10-CM

## 2016-07-28 DIAGNOSIS — R102 Pelvic and perineal pain: Secondary | ICD-10-CM

## 2016-07-28 DIAGNOSIS — Z79899 Other long term (current) drug therapy: Secondary | ICD-10-CM

## 2016-07-28 DIAGNOSIS — M25462 Effusion, left knee: Secondary | ICD-10-CM

## 2016-07-28 DIAGNOSIS — Z8551 Personal history of malignant neoplasm of bladder: Secondary | ICD-10-CM

## 2016-07-28 DIAGNOSIS — Z87440 Personal history of urinary (tract) infections: Secondary | ICD-10-CM

## 2016-07-28 DIAGNOSIS — M25562 Pain in left knee: Secondary | ICD-10-CM

## 2016-07-28 DIAGNOSIS — I25810 Atherosclerosis of coronary artery bypass graft(s) without angina pectoris: Secondary | ICD-10-CM

## 2016-07-28 DIAGNOSIS — N200 Calculus of kidney: Secondary | ICD-10-CM

## 2016-07-28 DIAGNOSIS — I739 Peripheral vascular disease, unspecified: Secondary | ICD-10-CM | POA: Diagnosis present

## 2016-07-28 DIAGNOSIS — N179 Acute kidney failure, unspecified: Secondary | ICD-10-CM | POA: Diagnosis not present

## 2016-07-28 DIAGNOSIS — Z951 Presence of aortocoronary bypass graft: Secondary | ICD-10-CM

## 2016-07-28 DIAGNOSIS — N390 Urinary tract infection, site not specified: Secondary | ICD-10-CM

## 2016-07-28 DIAGNOSIS — I2510 Atherosclerotic heart disease of native coronary artery without angina pectoris: Secondary | ICD-10-CM | POA: Diagnosis present

## 2016-07-28 DIAGNOSIS — Z7982 Long term (current) use of aspirin: Secondary | ICD-10-CM

## 2016-07-28 DIAGNOSIS — N400 Enlarged prostate without lower urinary tract symptoms: Secondary | ICD-10-CM

## 2016-07-28 DIAGNOSIS — B9620 Unspecified Escherichia coli [E. coli] as the cause of diseases classified elsewhere: Secondary | ICD-10-CM

## 2016-07-28 DIAGNOSIS — Z888 Allergy status to other drugs, medicaments and biological substances status: Secondary | ICD-10-CM

## 2016-07-28 DIAGNOSIS — Z23 Encounter for immunization: Secondary | ICD-10-CM

## 2016-07-28 DIAGNOSIS — Z6835 Body mass index (BMI) 35.0-35.9, adult: Secondary | ICD-10-CM

## 2016-07-28 DIAGNOSIS — N1330 Unspecified hydronephrosis: Secondary | ICD-10-CM

## 2016-07-28 DIAGNOSIS — N3289 Other specified disorders of bladder: Secondary | ICD-10-CM | POA: Diagnosis present

## 2016-07-28 DIAGNOSIS — N3001 Acute cystitis with hematuria: Secondary | ICD-10-CM | POA: Diagnosis present

## 2016-07-28 DIAGNOSIS — N1339 Other hydronephrosis: Secondary | ICD-10-CM | POA: Diagnosis present

## 2016-07-28 LAB — CBC WITH DIFFERENTIAL/PLATELET
Basophils Absolute: 0 10*3/uL (ref 0.0–0.1)
Basophils Relative: 0 %
EOS PCT: 3 %
Eosinophils Absolute: 0.4 10*3/uL (ref 0.0–0.7)
HEMATOCRIT: 36.2 % — AB (ref 39.0–52.0)
Hemoglobin: 11.7 g/dL — ABNORMAL LOW (ref 13.0–17.0)
LYMPHS ABS: 3.7 10*3/uL (ref 0.7–4.0)
LYMPHS PCT: 27 %
MCH: 29 pg (ref 26.0–34.0)
MCHC: 32.3 g/dL (ref 30.0–36.0)
MCV: 89.8 fL (ref 78.0–100.0)
MONO ABS: 1 10*3/uL (ref 0.1–1.0)
MONOS PCT: 7 %
NEUTROS ABS: 8.7 10*3/uL — AB (ref 1.7–7.7)
Neutrophils Relative %: 63 %
PLATELETS: 240 10*3/uL (ref 150–400)
RBC: 4.03 MIL/uL — ABNORMAL LOW (ref 4.22–5.81)
RDW: 14.9 % (ref 11.5–15.5)
WBC: 13.9 10*3/uL — ABNORMAL HIGH (ref 4.0–10.5)

## 2016-07-28 LAB — I-STAT CHEM 8, ED
BUN: 39 mg/dL — AB (ref 6–20)
CHLORIDE: 107 mmol/L (ref 101–111)
CREATININE: 2.1 mg/dL — AB (ref 0.61–1.24)
Calcium, Ion: 1.17 mmol/L (ref 1.12–1.23)
GLUCOSE: 108 mg/dL — AB (ref 65–99)
HEMATOCRIT: 37 % — AB (ref 39.0–52.0)
HEMOGLOBIN: 12.6 g/dL — AB (ref 13.0–17.0)
POTASSIUM: 5.2 mmol/L — AB (ref 3.5–5.1)
Sodium: 142 mmol/L (ref 135–145)
TCO2: 22 mmol/L (ref 0–100)

## 2016-07-28 LAB — I-STAT CG4 LACTIC ACID, ED: Lactic Acid, Venous: 1.77 mmol/L (ref 0.5–1.9)

## 2016-07-28 MED ORDER — ONDANSETRON HCL 4 MG/2ML IJ SOLN
4.0000 mg | Freq: Once | INTRAMUSCULAR | Status: AC
  Administered 2016-07-28: 4 mg via INTRAVENOUS
  Filled 2016-07-28: qty 2

## 2016-07-28 MED ORDER — DEXTROSE 5 % IV SOLN
1.0000 g | Freq: Once | INTRAVENOUS | Status: AC
  Administered 2016-07-29: 1 g via INTRAVENOUS
  Filled 2016-07-28: qty 10

## 2016-07-28 MED ORDER — MORPHINE SULFATE (PF) 4 MG/ML IV SOLN
8.0000 mg | Freq: Once | INTRAVENOUS | Status: AC
  Administered 2016-07-28: 8 mg via INTRAVENOUS
  Filled 2016-07-28: qty 2

## 2016-07-28 MED ORDER — SODIUM CHLORIDE 0.9 % IV BOLUS (SEPSIS)
1000.0000 mL | Freq: Once | INTRAVENOUS | Status: AC
  Administered 2016-07-28: 1000 mL via INTRAVENOUS

## 2016-07-28 NOTE — ED Triage Notes (Signed)
Pt reports has not urinated since 8am.  Pt c/o pain at perineal area.  Pt was urinating every 30 minutes prior to today.

## 2016-07-28 NOTE — ED Provider Notes (Signed)
[facility] DEPT Provider Note   CSN: [id number] Arrival date & time: 07/28/16  2120     History   Chief Complaint Chief Complaint  Patient presents with  . Urinary Retention    HPI [name] is a 81 y.o. male.  82 yo M with a chief complaint of pelvic pain. He states it hurts worse than his perineum  Denies fevers or chills. Patient has had increased urinary frequency for the past couple days. Now having no urine output. Pain significantly increased today. Denies vomiting denies diarrhea. Denies prior history of same. Denies risky sexual situations.   The history is provided by the patient and the spouse.  Abdominal Pain   This is a new problem. The current episode started yesterday. The problem occurs constantly. The problem has not changed since onset.The pain is associated with an unknown factor. The pain is located in the perineum. The pain is at a severity of 10/10. The pain is severe. Pertinent negatives include fever, diarrhea, vomiting, headaches, arthralgias and myalgias. The symptoms are aggravated by certain positions and activity. Nothing relieves the symptoms.    Past Medical History:  Diagnosis Date  . CAD (coronary artery disease)   . Hypertension   . Kidney stones   . Prostate CA ([place])     There are no active problems to display for this patient.   Past Surgical History:  Procedure Laterality Date  . CARDIAC SURGERY    . CORONARY ARTERY BYPASS GRAFT    . PENILE PROSTHESIS IMPLANT    . PROSTATE SURGERY         Home Medications    Prior to Admission medications   Medication Sig Start Date End Date Taking? Authorizing Provider  acetaminophen (TYLENOL) 500 MG tablet Take 500 mg by mouth.    Historical Provider, MD  Aloe Vera Concentrate 25 MG CAPS     Historical Provider, MD  amoxicillin-clavulanate (AUGMENTIN) 875-125 MG per tablet Take 1 tablet by mouth every 12 (twelve) hours. 04/19/15   Antonietta Breach, PA-C  aspirin EC 81 MG tablet Take 81  mg by mouth every morning.    Historical Provider, MD  benzonatate (TESSALON) 100 MG capsule Take 1 capsule (100 mg total) by mouth 3 (three) times daily as needed for cough. 04/19/15   Antonietta Breach, PA-C  cyclobenzaprine (FLEXERIL) 5 MG tablet Take 2 tablets (10 mg total) by mouth 2 (two) times daily as needed for muscle spasms. 09/16/15   Gwenyth Allegra Tegeler, MD  diazepam (VALIUM) 5 MG tablet Take 1 tablet (5 mg total) by mouth 2 (two) times daily. 09/14/15   Montine Circle, PA-C  fluticasone (FLONASE) 50 MCG/ACT nasal spray Place 2 sprays into both nostrils daily. 04/19/15   Antonietta Breach, PA-C  HYDROcodone-acetaminophen (NORCO/VICODIN) 5-325 MG tablet Take 1 tablet by mouth every 6 (six) hours as needed for severe pain. 09/16/15   Gwenyth Allegra Tegeler, MD  metoprolol succinate (TOPROL-XL) 50 MG 24 hr tablet  01/11/16   Historical Provider, MD  Multiple Vitamin (MULTIVITAMIN) capsule Take by mouth.    Historical Provider, MD  nitroGLYCERIN (NITRODUR - DOSED IN MG/24 HR) 0.4 mg/hr patch Place 0.4 mg onto the skin at bedtime.  08/08/15   Historical Provider, MD  olmesartan (BENICAR) 40 MG tablet Take 40 mg by mouth daily.    Historical Provider, MD  olmesartan-hydrochlorothiazide (BENICAR HCT) 40-25 MG tablet Take 1 tablet by mouth daily. 12/12/15   Historical Provider, MD  penicillin v potassium (VEETID) 500 MG tablet Take  1 tablet (500 mg total) by mouth 4 (four) times daily. 02/15/14   Antonietta Breach, PA-C  rosuvastatin (CRESTOR) 10 MG tablet  11/10/15   Historical Provider, MD  traMADol (ULTRAM) 50 MG tablet Take 50 mg by mouth.    Historical Provider, MD  UNABLE TO FIND Apply to eye.    Historical Provider, MD  vitamin C (ASCORBIC ACID) 500 MG tablet Take 1,000 mg by mouth.    Historical Provider, MD    Family History History reviewed. No pertinent family history.  Social History Social History  Substance Use Topics  . Smoking status: Never Smoker  . Smokeless tobacco: Never Used  . Alcohol use No       Allergies   Other   Review of Systems Review of Systems  Constitutional: Negative for chills and fever.  HENT: Negative for congestion and facial swelling.   Eyes: Negative for discharge and visual disturbance.  Respiratory: Negative for shortness of breath.   Cardiovascular: Negative for chest pain and palpitations.  Gastrointestinal: Negative for abdominal pain, diarrhea and vomiting.  Genitourinary: Positive for urgency.  Musculoskeletal: Negative for arthralgias and myalgias.  Skin: Negative for color change and rash.  Neurological: Negative for tremors, syncope and headaches.  Psychiatric/Behavioral: Negative for confusion and dysphoric mood.     Physical Exam Updated Vital Signs BP 164/92   Pulse 73   Temp 98.4 F (36.9 C) (Oral)   Resp 20   Ht 6\' 3"  (1.905 m)   Wt 287 lb (130.2 kg)   SpO2 99%   BMI 35.87 kg/m   Physical Exam  Constitutional: He is oriented to person, place, and time. He appears well-developed and well-nourished.  HENT:  Head: Normocephalic and atraumatic.  Eyes: Conjunctivae and EOM are normal. Pupils are equal, round, and reactive to light.  Neck: Normal range of motion. No JVD present.  Cardiovascular: Normal rate and regular rhythm.   Pulmonary/Chest: Effort normal. No stridor. No respiratory distress.  Abdominal: He exhibits no distension. There is no tenderness. There is no guarding.  Genitourinary: Uncircumcised.     Musculoskeletal: Normal range of motion. He exhibits no edema.  Neurological: He is alert and oriented to person, place, and time.  Skin: Skin is warm and dry.  Psychiatric: He has a normal mood and affect. His behavior is normal.     ED Treatments / Results  Labs (all labs ordered are listed, but only abnormal results are displayed) Labs Reviewed  CBC WITH DIFFERENTIAL/PLATELET - Abnormal; Notable for the following:       Result Value   WBC 13.9 (*)    RBC 4.03 (*)    Hemoglobin 11.7 (*)    HCT 36.2 (*)     Neutro Abs 8.7 (*)    All other components within normal limits  I-STAT CHEM 8, ED - Abnormal; Notable for the following:    Potassium 5.2 (*)    BUN 39 (*)    Creatinine, Ser 2.10 (*)    Glucose, Bld 108 (*)    Hemoglobin 12.6 (*)    HCT 37.0 (*)    All other components within normal limits  URINALYSIS, ROUTINE W REFLEX MICROSCOPIC (NOT AT [facility])  COMPREHENSIVE METABOLIC PANEL  I-STAT CG4 LACTIC ACID, ED    EKG  EKG Interpretation None       Radiology No results found.  Procedures Procedures (including critical care time)  Medications Ordered in ED Medications  cefTRIAXone (ROCEPHIN) 1 g in dextrose 5 % 50 mL IVPB (not  administered)  morphine 4 MG/ML injection 8 mg (not administered)  ondansetron (ZOFRAN) injection 4 mg (not administered)  sodium chloride 0.9 % bolus 1,000 mL (1,000 mLs Intravenous New Bag/Given 07/28/16 2311)  morphine 4 MG/ML injection 8 mg (8 mg Intravenous Given 07/28/16 2311)  ondansetron (ZOFRAN) injection 4 mg (4 mg Intravenous Given 07/28/16 2311)     Initial Impression / Assessment and Plan / ED Course  I have reviewed the triage vital signs and the nursing notes.  Pertinent labs & imaging results that were available during my care of the patient were reviewed by me and considered in my medical decision making (see chart for details).  Clinical Course    82 yo M With a chief complaint of pelvic pain. Going on just today. Patient having exquisite tenderness on my exam. No signs of necrosis. With the amount of pain and concern possibility of Fournier's gangrene. Patient has some purulent drainage from his penis. This could be a urinary tract infection. Has been enuretic throughout the day. Mild AKI, leukocytosis. Given a dose of Rocephin.  CT reviewed by myself, suspect UTI.  Another fluid bolus.  Turned over to Dr. Roxanne Mins.   The patients results and plan were reviewed and discussed.   Any x-rays performed were independently reviewed by  myself.   Differential diagnosis were considered with the presenting HPI.  Medications  cefTRIAXone (ROCEPHIN) 1 g in dextrose 5 % 50 mL IVPB (not administered)  morphine 4 MG/ML injection 8 mg (not administered)  ondansetron (ZOFRAN) injection 4 mg (not administered)  sodium chloride 0.9 % bolus 1,000 mL (1,000 mLs Intravenous New Bag/Given 07/28/16 2311)  morphine 4 MG/ML injection 8 mg (8 mg Intravenous Given 07/28/16 2311)  ondansetron (ZOFRAN) injection 4 mg (4 mg Intravenous Given 07/28/16 2311)    Vitals:   07/28/16 2142 07/28/16 2329 07/28/16 2330  BP: 164/79 168/88 164/92  Pulse: 74  73  Resp: 16 20   Temp: 98.4 F (36.9 C)    TempSrc: Oral    SpO2: 100% 97% 99%  Weight: 287 lb (130.2 kg)    Height: 6\' 3"  (1.905 m)      Final diagnoses:  Pelvic pain in male  UTI (lower urinary tract infection)      Final Clinical Impressions(s) / ED Diagnoses   Final diagnoses:  Pelvic pain in male  UTI (lower urinary tract infection)    New Prescriptions New Prescriptions   No medications on file     Deno Etienne, DO 07/29/16 0024

## 2016-07-29 ENCOUNTER — Encounter (HOSPITAL_COMMUNITY): Payer: Self-pay

## 2016-07-29 ENCOUNTER — Emergency Department (HOSPITAL_COMMUNITY): Payer: Medicare Other

## 2016-07-29 DIAGNOSIS — B029 Zoster without complications: Secondary | ICD-10-CM | POA: Diagnosis not present

## 2016-07-29 DIAGNOSIS — N179 Acute kidney failure, unspecified: Secondary | ICD-10-CM | POA: Diagnosis present

## 2016-07-29 DIAGNOSIS — I10 Essential (primary) hypertension: Secondary | ICD-10-CM | POA: Diagnosis present

## 2016-07-29 DIAGNOSIS — D638 Anemia in other chronic diseases classified elsewhere: Secondary | ICD-10-CM | POA: Diagnosis not present

## 2016-07-29 DIAGNOSIS — Z6835 Body mass index (BMI) 35.0-35.9, adult: Secondary | ICD-10-CM | POA: Diagnosis not present

## 2016-07-29 DIAGNOSIS — Z23 Encounter for immunization: Secondary | ICD-10-CM | POA: Diagnosis not present

## 2016-07-29 DIAGNOSIS — N320 Bladder-neck obstruction: Secondary | ICD-10-CM | POA: Diagnosis not present

## 2016-07-29 DIAGNOSIS — I2510 Atherosclerotic heart disease of native coronary artery without angina pectoris: Secondary | ICD-10-CM | POA: Diagnosis not present

## 2016-07-29 DIAGNOSIS — D72829 Elevated white blood cell count, unspecified: Secondary | ICD-10-CM | POA: Diagnosis not present

## 2016-07-29 DIAGNOSIS — D62 Acute posthemorrhagic anemia: Secondary | ICD-10-CM | POA: Diagnosis not present

## 2016-07-29 DIAGNOSIS — I739 Peripheral vascular disease, unspecified: Secondary | ICD-10-CM | POA: Diagnosis not present

## 2016-07-29 DIAGNOSIS — Z888 Allergy status to other drugs, medicaments and biological substances status: Secondary | ICD-10-CM | POA: Diagnosis not present

## 2016-07-29 DIAGNOSIS — N1339 Other hydronephrosis: Secondary | ICD-10-CM | POA: Diagnosis not present

## 2016-07-29 DIAGNOSIS — N189 Chronic kidney disease, unspecified: Secondary | ICD-10-CM | POA: Diagnosis not present

## 2016-07-29 DIAGNOSIS — R5381 Other malaise: Secondary | ICD-10-CM | POA: Diagnosis not present

## 2016-07-29 DIAGNOSIS — Z87442 Personal history of urinary calculi: Secondary | ICD-10-CM | POA: Diagnosis not present

## 2016-07-29 DIAGNOSIS — Z951 Presence of aortocoronary bypass graft: Secondary | ICD-10-CM | POA: Diagnosis not present

## 2016-07-29 DIAGNOSIS — N1330 Unspecified hydronephrosis: Secondary | ICD-10-CM | POA: Diagnosis not present

## 2016-07-29 DIAGNOSIS — Z8546 Personal history of malignant neoplasm of prostate: Secondary | ICD-10-CM | POA: Diagnosis not present

## 2016-07-29 DIAGNOSIS — R32 Unspecified urinary incontinence: Secondary | ICD-10-CM | POA: Diagnosis not present

## 2016-07-29 DIAGNOSIS — D649 Anemia, unspecified: Secondary | ICD-10-CM | POA: Diagnosis not present

## 2016-07-29 DIAGNOSIS — Z8551 Personal history of malignant neoplasm of bladder: Secondary | ICD-10-CM | POA: Diagnosis not present

## 2016-07-29 DIAGNOSIS — N390 Urinary tract infection, site not specified: Secondary | ICD-10-CM | POA: Diagnosis present

## 2016-07-29 DIAGNOSIS — N3001 Acute cystitis with hematuria: Secondary | ICD-10-CM | POA: Diagnosis present

## 2016-07-29 DIAGNOSIS — Z79899 Other long term (current) drug therapy: Secondary | ICD-10-CM | POA: Diagnosis not present

## 2016-07-29 DIAGNOSIS — L259 Unspecified contact dermatitis, unspecified cause: Secondary | ICD-10-CM | POA: Diagnosis not present

## 2016-07-29 DIAGNOSIS — N3289 Other specified disorders of bladder: Secondary | ICD-10-CM | POA: Diagnosis not present

## 2016-07-29 DIAGNOSIS — Z87440 Personal history of urinary (tract) infections: Secondary | ICD-10-CM | POA: Diagnosis not present

## 2016-07-29 DIAGNOSIS — I158 Other secondary hypertension: Secondary | ICD-10-CM | POA: Diagnosis not present

## 2016-07-29 DIAGNOSIS — Z7982 Long term (current) use of aspirin: Secondary | ICD-10-CM | POA: Diagnosis not present

## 2016-07-29 DIAGNOSIS — M10062 Idiopathic gout, left knee: Secondary | ICD-10-CM | POA: Diagnosis not present

## 2016-07-29 LAB — URINALYSIS, ROUTINE W REFLEX MICROSCOPIC
Bilirubin Urine: NEGATIVE
Glucose, UA: NEGATIVE mg/dL
KETONES UR: 15 mg/dL — AB
NITRITE: NEGATIVE
PH: 5.5 (ref 5.0–8.0)
PROTEIN: 100 mg/dL — AB
SPECIFIC GRAVITY, URINE: 1.016 (ref 1.005–1.030)

## 2016-07-29 LAB — COMPREHENSIVE METABOLIC PANEL
ALBUMIN: 3.4 g/dL — AB (ref 3.5–5.0)
ALT: 13 U/L — ABNORMAL LOW (ref 17–63)
AST: 21 U/L (ref 15–41)
Alkaline Phosphatase: 56 U/L (ref 38–126)
Anion gap: 10 (ref 5–15)
BUN: 36 mg/dL — AB (ref 6–20)
CHLORIDE: 108 mmol/L (ref 101–111)
CO2: 21 mmol/L — AB (ref 22–32)
Calcium: 9.9 mg/dL (ref 8.9–10.3)
Creatinine, Ser: 2.13 mg/dL — ABNORMAL HIGH (ref 0.61–1.24)
GFR calc Af Amer: 31 mL/min — ABNORMAL LOW (ref >60)
GFR calc non Af Amer: 27 mL/min — ABNORMAL LOW (ref >60)
GLUCOSE: 105 mg/dL — AB (ref 65–99)
POTASSIUM: 5.3 mmol/L — AB (ref 3.5–5.1)
Sodium: 139 mmol/L (ref 135–145)
Total Bilirubin: 0.7 mg/dL (ref 0.3–1.2)
Total Protein: 7.4 g/dL (ref 6.5–8.1)

## 2016-07-29 LAB — URINE MICROSCOPIC-ADD ON

## 2016-07-29 MED ORDER — MORPHINE SULFATE (PF) 4 MG/ML IV SOLN
8.0000 mg | Freq: Once | INTRAVENOUS | Status: AC
  Administered 2016-07-29: 8 mg via INTRAVENOUS
  Filled 2016-07-29: qty 2

## 2016-07-29 MED ORDER — SODIUM CHLORIDE 0.9 % IV SOLN
INTRAVENOUS | Status: DC
  Administered 2016-07-29 – 2016-08-01 (×6): via INTRAVENOUS

## 2016-07-29 MED ORDER — ONDANSETRON HCL 4 MG/2ML IJ SOLN: 4.0000 mg | Freq: Once | INTRAMUSCULAR | Status: DC

## 2016-07-29 MED ORDER — PNEUMOCOCCAL VAC POLYVALENT 25 MCG/0.5ML IJ INJ
0.5000 mL | INJECTION | INTRAMUSCULAR | Status: AC
  Administered 2016-07-31: 0.5 mL via INTRAMUSCULAR
  Filled 2016-07-29: qty 0.5

## 2016-07-29 MED ORDER — SODIUM CHLORIDE 0.9 % IV BOLUS (SEPSIS)
1000.0000 mL | Freq: Once | INTRAVENOUS | Status: AC
  Administered 2016-07-29: 1000 mL via INTRAVENOUS

## 2016-07-29 MED ORDER — DEXTROSE 5 % IV SOLN
1.0000 g | INTRAVENOUS | Status: DC
  Administered 2016-07-29 – 2016-07-31 (×3): 1 g via INTRAVENOUS
  Filled 2016-07-29 (×4): qty 10

## 2016-07-29 MED ORDER — NEPRO/CARBSTEADY PO LIQD
237.0000 mL | Freq: Two times a day (BID) | ORAL | Status: DC
  Administered 2016-07-30 – 2016-08-02 (×6): 237 mL via ORAL

## 2016-07-29 MED ORDER — HEPARIN SODIUM (PORCINE) 5000 UNIT/ML IJ SOLN
5000.0000 [IU] | Freq: Three times a day (TID) | INTRAMUSCULAR | Status: DC
  Administered 2016-07-29 – 2016-08-02 (×13): 5000 [IU] via SUBCUTANEOUS
  Filled 2016-07-29 (×13): qty 1

## 2016-07-29 MED ORDER — OXYCODONE HCL 5 MG PO TABS
5.0000 mg | ORAL_TABLET | Freq: Four times a day (QID) | ORAL | Status: DC | PRN
  Administered 2016-07-29 – 2016-07-31 (×3): 5 mg via ORAL
  Filled 2016-07-29 (×4): qty 1

## 2016-07-29 MED ORDER — SODIUM CHLORIDE 0.9% FLUSH
3.0000 mL | Freq: Two times a day (BID) | INTRAVENOUS | Status: DC
  Administered 2016-08-01 – 2016-08-02 (×2): 3 mL via INTRAVENOUS

## 2016-07-29 MED ORDER — CEFTRIAXONE SODIUM 250 MG IJ SOLR: 250.0000 mg | Freq: Once | INTRAMUSCULAR | Status: DC

## 2016-07-29 NOTE — ED Notes (Signed)
Pt to ct at this time via stretcher.

## 2016-07-29 NOTE — ED Notes (Signed)
Care assumed from Dr. Tyrone Nine. Patient evaluated for inability to urinate. Found to have evidence of acute kidney injury. Urinalysis is still pending at this time. CT showed bladder wall thickening with inflammation consistent with severe cystitis. Bilateral hydronephrosis is noted which probably accounts for his acute kidney injury. He is started on antibiotics. Case is discussed with Dr. Doylene Canard, on call for [name], who agrees to calm and evaluate the patient for admission.  Results for orders placed or performed during the hospital encounter of 07/28/16  CBC with Differential  Result Value Ref Range   WBC 13.9 (H) 4.0 - 10.5 K/uL   RBC 4.03 (L) 4.22 - 5.81 MIL/uL   Hemoglobin 11.7 (L) 13.0 - 17.0 g/dL   HCT 36.2 (L) 39.0 - 52.0 %   MCV 89.8 78.0 - 100.0 fL   MCH 29.0 26.0 - 34.0 pg   MCHC 32.3 30.0 - 36.0 g/dL   RDW 14.9 11.5 - 15.5 %   Platelets 240 150 - 400 K/uL   Neutrophils Relative % 63 %   Neutro Abs 8.7 (H) 1.7 - 7.7 K/uL   Lymphocytes Relative 27 %   Lymphs Abs 3.7 0.7 - 4.0 K/uL   Monocytes Relative 7 %   Monocytes Absolute 1.0 0.1 - 1.0 K/uL   Eosinophils Relative 3 %   Eosinophils Absolute 0.4 0.0 - 0.7 K/uL   Basophils Relative 0 %   Basophils Absolute 0.0 0.0 - 0.1 K/uL  Comprehensive metabolic panel  Result Value Ref Range   Sodium 139 135 - 145 mmol/L   Potassium 5.3 (H) 3.5 - 5.1 mmol/L   Chloride 108 101 - 111 mmol/L   CO2 21 (L) 22 - 32 mmol/L   Glucose, Bld 105 (H) 65 - 99 mg/dL   BUN 36 (H) 6 - 20 mg/dL   Creatinine, Ser 2.13 (H) 0.61 - 1.24 mg/dL   Calcium 9.9 8.9 - 10.3 mg/dL   Total Protein 7.4 6.5 - 8.1 g/dL   Albumin 3.4 (L) 3.5 - 5.0 g/dL   AST 21 15 - 41 U/L   ALT 13 (L) 17 - 63 U/L   Alkaline Phosphatase 56 38 - 126 U/L   Total Bilirubin 0.7 0.3 - 1.2 mg/dL   GFR calc non Af Amer 27 (L) >60 mL/min   GFR calc Af Amer 31 (L) >60 mL/min   Anion gap 10 5 - 15  I-Stat Chem 8, ED  Result Value Ref Range   Sodium 142 135 - 145 mmol/L    Potassium 5.2 (H) 3.5 - 5.1 mmol/L   Chloride 107 101 - 111 mmol/L   BUN 39 (H) 6 - 20 mg/dL   Creatinine, Ser 2.10 (H) 0.61 - 1.24 mg/dL   Glucose, Bld 108 (H) 65 - 99 mg/dL   Calcium, Ion 1.17 1.12 - 1.23 mmol/L   TCO2 22 0 - 100 mmol/L   Hemoglobin 12.6 (L) 13.0 - 17.0 g/dL   HCT 37.0 (L) 39.0 - 52.0 %  I-Stat CG4 Lactic Acid, ED  Result Value Ref Range   Lactic Acid, Venous 1.77 0.5 - 1.9 mmol/L   Ct Abdomen Pelvis Wo Contrast  Result Date: 07/29/2016 CLINICAL DATA:  Acute onset of pelvic pain and increased urinary frequency. Initial encounter. EXAM: CT ABDOMEN AND PELVIS WITHOUT CONTRAST TECHNIQUE: Multidetector CT imaging of the abdomen and pelvis was performed following the standard protocol without IV contrast. COMPARISON:  CT of the abdomen and pelvis from 11/16/2014 FINDINGS: Bibasilar scarring is noted. Cystic bronchiectasis  is noted at the lung bases. The patient is status post median sternotomy. A calcified granuloma is noted at the right hepatic lobe. The liver and spleen are otherwise unremarkable. The gallbladder is within normal limits. The pancreas and adrenal glands are unremarkable. Moderate right-sided and mild left-sided hydronephrosis are noted. Nonspecific perinephric stranding is noted bilaterally. No distal obstructing stones are seen. This appears to reflect the patient's relatively severe cystitis. A nonobstructing 8 mm stone is noted at the lower pole of the right kidney. No free fluid is identified. The small bowel is unremarkable in appearance. The stomach is within normal limits. No acute vascular abnormalities are seen. Scattered calcification is seen along the abdominal aorta and its branches. The appendix is normal in caliber, without evidence of appendicitis. Scattered diverticulosis is noted along the descending and sigmoid colon, without evidence of diverticulitis. The bladder is mildly distended. There is diffuse irregular bladder wall thickening and prominent  surrounding soft tissue inflammation, compatible with cystitis. Underlying mass cannot be excluded. No inguinal lymphadenopathy is seen. A penile implant is noted. No acute osseous abnormalities are identified. Anterior osteophytes are noted along the lower thoracic and lumbar spine. Vacuum phenomenon is noted at L5-S1. IMPRESSION: 1. Moderate right-sided and mild left-sided hydronephrosis noted. No distal obstructing stone seen. This appears to reflect the patient's relatively severe cystitis. 2. Diffuse irregular bladder wall thickening and prominent surrounding soft inflammation, compatible with cystitis. Underlying mass cannot be excluded. Cystoscopy would be helpful for further evaluation after completion of treatment for cystitis, as deemed clinically appropriate. 3. Bibasilar scarring and cystic bronchiectasis noted at the lung bases. 4. Nonobstructing 8 mm stone at the lower pole of the right kidney. 5. Scattered calcification along the abdominal aorta and branches. 6. Scattered diverticulosis along the descending and sigmoid colon, without evidence of diverticulitis. Electronically Signed   By: Garald Balding [name]   On: 07/29/2016 [id number]      Delora Fuel, MD [id number]

## 2016-07-29 NOTE — ED Notes (Signed)
D/c foley order. No indwelling foley catheter was placed.

## 2016-07-29 NOTE — ED Notes (Signed)
Attempted report 

## 2016-07-29 NOTE — H&P (Signed)
Referring Physician:  EAN GETTEL is an 82 y.o. male.                       Chief Complaint: Pelvic pain  HPI: 82 year old male with recurrent pelvic pain x 2 days. 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 UA is positive for severe UTI and abdominal and pelvic scan is positive for severe cystitis with non-obstructive stone. Patient has past medical history of CAD, hypertension, kidney stone and prostate cancer.  Past Medical History:  Diagnosis Date  . CAD (coronary artery disease)   . Hypertension   . Kidney stones   . Prostate CA [facility])       Past Surgical History:  Procedure Laterality Date  . CARDIAC SURGERY    . CORONARY ARTERY BYPASS GRAFT    . PENILE PROSTHESIS IMPLANT    . PROSTATE SURGERY      History reviewed. No pertinent family history. Social History:  reports that he has never smoked. He has never used smokeless tobacco. He reports that he does not drink alcohol or use drugs.  Allergies:  Allergies  Allergen Reactions  . Other Other (See Comments)    Antihistamines-unknown reaction     (Not in a hospital admission)  Results for orders placed or performed during the hospital encounter of 07/28/16 (from the past 48 hour(s))  CBC with Differential     Status: Abnormal   Collection Time: 07/28/16 11:24 PM  Result Value Ref Range   WBC 13.9 (H) 4.0 - 10.5 K/uL   RBC 4.03 (L) 4.22 - 5.81 MIL/uL   Hemoglobin 11.7 (L) 13.0 - 17.0 g/dL   HCT 36.2 (L) 39.0 - 52.0 %   MCV 89.8 78.0 - 100.0 fL   MCH 29.0 26.0 - 34.0 pg   MCHC 32.3 30.0 - 36.0 g/dL   RDW 14.9 11.5 - 15.5 %   Platelets 240 150 - 400 K/uL   Neutrophils Relative % 63 %   Neutro Abs 8.7 (H) 1.7 - 7.7 K/uL   Lymphocytes Relative 27 %   Lymphs Abs 3.7 0.7 - 4.0 K/uL   Monocytes Relative 7 %   Monocytes Absolute 1.0 0.1 - 1.0 K/uL   Eosinophils  Relative 3 %   Eosinophils Absolute 0.4 0.0 - 0.7 K/uL   Basophils Relative 0 %   Basophils Absolute 0.0 0.0 - 0.1 K/uL  I-Stat Chem 8, ED     Status: Abnormal   Collection Time: 07/28/16 11:29 PM  Result Value Ref Range   Sodium 142 135 - 145 mmol/L   Potassium 5.2 (H) 3.5 - 5.1 mmol/L   Chloride 107 101 - 111 mmol/L   BUN 39 (H) 6 - 20 mg/dL   Creatinine, Ser 2.10 (H) 0.61 - 1.24 mg/dL   Glucose, Bld 108 (H) 65 - 99 mg/dL   Calcium, Ion 1.17 1.12 - 1.23 mmol/L   TCO2 22 0 - 100 mmol/L   Hemoglobin 12.6 (L) 13.0 - 17.0 g/dL   HCT 37.0 (L) 39.0 - 52.0 %  I-Stat CG4 Lactic Acid, ED     Status: None   Collection Time: 07/28/16 11:29 PM  Result Value Ref Range   Lactic Acid, Venous 1.77 0.5 - 1.9 mmol/L  Comprehensive metabolic panel     Status: Abnormal   Collection  Time: 07/28/16 11:57 PM  Result Value Ref Range   Sodium 139 135 - 145 mmol/L   Potassium 5.3 (H) 3.5 - 5.1 mmol/L   Chloride 108 101 - 111 mmol/L   CO2 21 (L) 22 - 32 mmol/L   Glucose, Bld 105 (H) 65 - 99 mg/dL   BUN 36 (H) 6 - 20 mg/dL   Creatinine, Ser 2.13 (H) 0.61 - 1.24 mg/dL   Calcium 9.9 8.9 - 10.3 mg/dL   Total Protein 7.4 6.5 - 8.1 g/dL   Albumin 3.4 (L) 3.5 - 5.0 g/dL   AST 21 15 - 41 U/L   ALT 13 (L) 17 - 63 U/L   Alkaline Phosphatase 56 38 - 126 U/L   Total Bilirubin 0.7 0.3 - 1.2 mg/dL   GFR calc non Af Amer 27 (L) >60 mL/min   GFR calc Af Amer 31 (L) >60 mL/min    Comment: (NOTE) The eGFR has been calculated using the CKD EPI equation. This calculation has not been validated in all clinical situations. eGFR's persistently <60 mL/min signify possible Chronic Kidney Disease.    Anion gap 10 5 - 15  Urinalysis, Routine w reflex microscopic (not at [facility])     Status: Abnormal   Collection Time: 07/29/16  1:58 AM  Result Value Ref Range   Color, Urine YELLOW YELLOW   APPearance TURBID (A) CLEAR   Specific Gravity, Urine 1.016 1.005 - 1.030   pH 5.5 5.0 - 8.0   Glucose, UA NEGATIVE NEGATIVE  mg/dL   Hgb urine dipstick LARGE (A) NEGATIVE   Bilirubin Urine NEGATIVE NEGATIVE   Ketones, ur 15 (A) NEGATIVE mg/dL   Protein, ur 100 (A) NEGATIVE mg/dL   Nitrite NEGATIVE NEGATIVE   Leukocytes, UA LARGE (A) NEGATIVE  Urine microscopic-add on     Status: Abnormal   Collection Time: 07/29/16  1:58 AM  Result Value Ref Range   Squamous Epithelial / LPF 0-5 (A) NONE SEEN   WBC, UA TOO NUMEROUS TO COUNT 0 - 5 WBC/hpf   RBC / HPF TOO NUMEROUS TO COUNT 0 - 5 RBC/hpf   Bacteria, UA MANY (A) NONE SEEN   Ct Abdomen Pelvis Wo Contrast  Result Date: 07/29/2016 CLINICAL DATA:  Acute onset of pelvic pain and increased urinary frequency. Initial encounter. EXAM: CT ABDOMEN AND PELVIS WITHOUT CONTRAST TECHNIQUE: Multidetector CT imaging of the abdomen and pelvis was performed following the standard protocol without IV contrast. COMPARISON:  CT of the abdomen and pelvis from 11/16/2014 FINDINGS: Bibasilar scarring is noted. Cystic bronchiectasis is noted at the lung bases. The patient is status post median sternotomy. A calcified granuloma is noted at the right hepatic lobe. The liver and spleen are otherwise unremarkable. The gallbladder is within normal limits. The pancreas and adrenal glands are unremarkable. Moderate right-sided and mild left-sided hydronephrosis are noted. Nonspecific perinephric stranding is noted bilaterally. No distal obstructing stones are seen. This appears to reflect the patient's relatively severe cystitis. A nonobstructing 8 mm stone is noted at the lower pole of the right kidney. No free fluid is identified. The small bowel is unremarkable in appearance. The stomach is within normal limits. No acute vascular abnormalities are seen. Scattered calcification is seen along the abdominal aorta and its branches. The appendix is normal in caliber, without evidence of appendicitis. Scattered diverticulosis is noted along the descending and sigmoid colon, without evidence of diverticulitis.  The bladder is mildly distended. There is diffuse irregular bladder wall thickening and prominent surrounding soft tissue  inflammation, compatible with cystitis. Underlying mass cannot be excluded. No inguinal lymphadenopathy is seen. A penile implant is noted. No acute osseous abnormalities are identified. Anterior osteophytes are noted along the lower thoracic and lumbar spine. Vacuum phenomenon is noted at L5-S1. IMPRESSION: 1. Moderate right-sided and mild left-sided hydronephrosis noted. No distal obstructing stone seen. This appears to reflect the patient's relatively severe cystitis. 2. Diffuse irregular bladder wall thickening and prominent surrounding soft inflammation, compatible with cystitis. Underlying mass cannot be excluded. Cystoscopy would be helpful for further evaluation after completion of treatment for cystitis, as deemed clinically appropriate. 3. Bibasilar scarring and cystic bronchiectasis noted at the lung bases. 4. Nonobstructing 8 mm stone at the lower pole of the right kidney. 5. Scattered calcification along the abdominal aorta and branches. 6. Scattered diverticulosis along the descending and sigmoid colon, without evidence of diverticulitis. Electronically Signed   By: Garald Balding [name]   On: 07/29/2016 00:53    Review Of Systems Constitutional: No fever or chills. No significant weight loss. Respiratory: Negative for Shortness of breath or asthma. Cardiovascular: Negative for chest pain. Gastrointestinal: Positive for abdominal pain. Negative for nausea, vomiting or diarrhea. No hepatitis. Genitourinary: Positive for flank pain, renal stone, UTI in past. Musculoskeletal: Negative for back pain. Positive arthritis. Skin: Negative for rash. Neurological: Negative for stroke, seizures, syncope or headache.  Blood pressure 112/93, pulse 88, temperature 98.4 F (36.9 C), temperature source Oral, resp. rate 20, height _0  (1.905 m), weight 130.2 kg (287 lb), SpO2 97  %. General appearance: alert, cooperative, appears stated age and mild distress Eyes: conjunctivae/corneas clear. PERRL, EOM's intact.  Neck: no adenopathy, no carotid bruit, no JVD, supple, trachea midline and thyroid not enlarged. Resp: clear to auscultation bilaterally Cardio: regular rate and rhythm, S1, S2 normal, II/VI systolic murmur, no click, rub or gallop GI: soft, pelvic area-tender; bowel sounds normal; no masses,  no organomegaly Extremities: atraumatic, no cyanosis or edema Skin: dry and warm. No rashes or lesions. Neurologic: Alert and oriented X 3, normal strength and tone. Moves all 4 extremities.  Assessment/Plan Acute renal failure Acute on chronic cystitis S/P prostate cancer S/P renal stone S/P UTI in past Hypertension CAD CABG  Admit. IV fluids and antibiotics.  Birdie Riddle, MD  07/29/2016, 2:44 AM

## 2016-07-29 NOTE — Progress Notes (Signed)
JUSITN FEI [id number] Admitted to [id number]: 07/29/2016 6:30 AM Attending Provider: [name], MD    [name] is a 82 y.o. male patient admitted from ED awake, alert  & orientated  X 3,  Full Code, VSS - Blood pressure (!) 140/92, pulse 83, temperature 98.6 F (37 C), resp. rate 20, height 6\' 3"  (1.905 m), weight 130.2 kg (287 lb), SpO2 98 %.RA, no c/o shortness of breath, no c/o chest pain, no distress noted. Tele # [id number] placed and pt is currently running: SR   IV site WDL:   with a transparent dsg that's clean dry and intact.  Allergies:   Allergies  Allergen Reactions  . Other Other (See Comments)    Antihistamines-unknown reaction     Past Medical History:  Diagnosis Date  . CAD (coronary artery disease)   . Hypertension   . Kidney stones   . Prostate CA [facility])     History:  obtained from patient  Pt orientation to unit, room and routine. Information packet given to patient.  Admission INP armband ID verified with patient/family, and in place. SR up x 2, fall risk assessment complete with Patient verbalizing understanding of risks associated with falls. Pt verbalizes an understanding of how to use the call bell and to call for help before getting out of bed.  Skin, clean-groin has MASD; patient skin cleansed and antifungal powder applied.     Will cont to monitor and assist as needed.  Parthenia Ames, RN 07/29/2016 6:30 AM

## 2016-07-30 ENCOUNTER — Inpatient Hospital Stay (HOSPITAL_COMMUNITY): Payer: Medicare Other

## 2016-07-30 LAB — CBC
HEMATOCRIT: 31.8 % — AB (ref 39.0–52.0)
Hemoglobin: 10 g/dL — ABNORMAL LOW (ref 13.0–17.0)
MCH: 28.6 pg (ref 26.0–34.0)
MCHC: 31.4 g/dL (ref 30.0–36.0)
MCV: 90.9 fL (ref 78.0–100.0)
Platelets: 187 10*3/uL (ref 150–400)
RBC: 3.5 MIL/uL — ABNORMAL LOW (ref 4.22–5.81)
RDW: 15.5 % (ref 11.5–15.5)
WBC: 15.5 10*3/uL — ABNORMAL HIGH (ref 4.0–10.5)

## 2016-07-30 LAB — BASIC METABOLIC PANEL
Anion gap: 8 (ref 5–15)
BUN: 35 mg/dL — AB (ref 6–20)
CO2: 19 mmol/L — ABNORMAL LOW (ref 22–32)
CREATININE: 2.24 mg/dL — AB (ref 0.61–1.24)
Calcium: 8.5 mg/dL — ABNORMAL LOW (ref 8.9–10.3)
Chloride: 115 mmol/L — ABNORMAL HIGH (ref 101–111)
GFR calc Af Amer: 29 mL/min — ABNORMAL LOW (ref >60)
GFR, EST NON AFRICAN AMERICAN: 25 mL/min — AB (ref >60)
GLUCOSE: 109 mg/dL — AB (ref 65–99)
POTASSIUM: 4.3 mmol/L (ref 3.5–5.1)
Sodium: 142 mmol/L (ref 135–145)

## 2016-07-30 MED ORDER — LIDOCAINE HCL 2 % EX GEL
1.0000 "application " | Freq: Once | CUTANEOUS | Status: AC
  Administered 2016-07-30: 1 via URETHRAL
  Filled 2016-07-30 (×2): qty 5

## 2016-07-30 MED ORDER — BELLADONNA ALKALOIDS-OPIUM 16.2-60 MG RE SUPP: 1.0000 | Freq: Four times a day (QID) | RECTAL | Status: DC | PRN

## 2016-07-30 NOTE — Progress Notes (Signed)
Subjective:  Complains of lower abdominal pain and states overall feeling better.  States had urological procedure in [place] and was treated for bladder and prostate cancer in the past.  Old records not available  Objective:  Vital Signs in the last 24 hours: Temp:  [99 F (37.2 C)-99.7 F (37.6 C)] 99.7 F (37.6 C) (08/21 0616) Pulse Rate:  [77-86] 77 (08/21 0616) Resp:  [18-19] 18 (08/21 0616) BP: (93-135)/(55-80) 96/60 (08/21 0616) SpO2:  [93 %-96 %] 96 % (08/21 0616) Weight:  [287 lb 11.2 oz (130.5 kg)] 287 lb 11.2 oz (130.5 kg) (08/21 0616)  Intake/Output from previous day: 08/20 0701 - 08/21 0700 In: 1100 [I.V.:1100] Out: -  Intake/Output from this shift: No intake/output data recorded.  Physical Exam: Neck: no adenopathy, no carotid bruit, no JVD and supple, symmetrical, trachea midline Lungs: clear to auscultation bilaterally Heart: regular rate and rhythm, S1, S2 normal and soft systolic murmur noted Abdomen: soft, non-tender; bowel sounds normal; no masses,  no organomegaly Extremities: extremities normal, atraumatic, no cyanosis or edema  Lab Results:  Recent Labs  07/28/16 2324 07/28/16 2329 07/30/16 0801  WBC 13.9*  --  15.5*  HGB 11.7* 12.6* 10.0*  PLT 240  --  187    Recent Labs  07/28/16 2357 07/30/16 0801  NA 139 142  K 5.3* 4.3  CL 108 115*  CO2 21* 19*  GLUCOSE 105* 109*  BUN 36* 35*  CREATININE 2.13* 2.24*   No results for input(s): TROPONINI in the last 72 hours.  Invalid input(s): CK, MB Hepatic Function Panel  Recent Labs  07/28/16 2357  PROT 7.4  ALBUMIN 3.4*  AST 21  ALT 13*  ALKPHOS 56  BILITOT 0.7   No results for input(s): CHOL in the last 72 hours. No results for input(s): PROTIME in the last 72 hours.  Imaging: Imaging results have been reviewed and Ct Abdomen Pelvis Wo Contrast  Result Date: 07/29/2016 CLINICAL DATA:  Acute onset of pelvic pain and increased urinary frequency. Initial encounter. EXAM: CT  ABDOMEN AND PELVIS WITHOUT CONTRAST TECHNIQUE: Multidetector CT imaging of the abdomen and pelvis was performed following the standard protocol without IV contrast. COMPARISON:  CT of the abdomen and pelvis from 11/16/2014 FINDINGS: Bibasilar scarring is noted. Cystic bronchiectasis is noted at the lung bases. The patient is status post median sternotomy. A calcified granuloma is noted at the right hepatic lobe. The liver and spleen are otherwise unremarkable. The gallbladder is within normal limits. The pancreas and adrenal glands are unremarkable. Moderate right-sided and mild left-sided hydronephrosis are noted. Nonspecific perinephric stranding is noted bilaterally. No distal obstructing stones are seen. This appears to reflect the patient's relatively severe cystitis. A nonobstructing 8 mm stone is noted at the lower pole of the right kidney. No free fluid is identified. The small bowel is unremarkable in appearance. The stomach is within normal limits. No acute vascular abnormalities are seen. Scattered calcification is seen along the abdominal aorta and its branches. The appendix is normal in caliber, without evidence of appendicitis. Scattered diverticulosis is noted along the descending and sigmoid colon, without evidence of diverticulitis. The bladder is mildly distended. There is diffuse irregular bladder wall thickening and prominent surrounding soft tissue inflammation, compatible with cystitis. Underlying mass cannot be excluded. No inguinal lymphadenopathy is seen. A penile implant is noted. No acute osseous abnormalities are identified. Anterior osteophytes are noted along the lower thoracic and lumbar spine. Vacuum phenomenon is noted at L5-S1. IMPRESSION: 1. Moderate right-sided and  mild left-sided hydronephrosis noted. No distal obstructing stone seen. This appears to reflect the patient's relatively severe cystitis. 2. Diffuse irregular bladder wall thickening and prominent surrounding soft  inflammation, compatible with cystitis. Underlying mass cannot be excluded. Cystoscopy would be helpful for further evaluation after completion of treatment for cystitis, as deemed clinically appropriate. 3. Bibasilar scarring and cystic bronchiectasis noted at the lung bases. 4. Nonobstructing 8 mm stone at the lower pole of the right kidney. 5. Scattered calcification along the abdominal aorta and branches. 6. Scattered diverticulosis along the descending and sigmoid colon, without evidence of diverticulitis. Electronically Signed   By: Garald Balding [name]   On: 07/29/2016 00:53    Cardiac Studies:  Assessment/Plan:  Acute on chronic cystitis/UTI Acute renal failure with bilateral hydronephrosis. Nephrolithiasis. History of prostate and bladder cancer. Coronary artery disease status post CABG Hypertension. Morbid obesity. Peripheral vascular disease. Osteoarthritis. Plan Continue present management. Urology consult called. Check old records. Check labs in a.m.  LOS: 1 day    [name] 07/30/2016, 11:41 AM

## 2016-07-30 NOTE — Progress Notes (Signed)
Placed patients hearing aides (2) in denture cup and reminded him not to put the cup on the meal trays.

## 2016-07-30 NOTE — Progress Notes (Signed)
Urology ordered foley catheter for patient r/t retention and accurate I/Os. Nursing student placed foley- with supervision from RN instructor, sterile procedure maintained. Catheter care done after placement.

## 2016-07-30 NOTE — Consult Note (Signed)
Urology Consult   Physician requesting consult: [name]  Reason for consult: Bilateral hydronephrosis and acute renal failure  History of Present Illness: [name] is a 82 y.o. male with PMH significant for prostate cancer s/p brachytherapy 2004/ADT (through 2016), BPH with LUTS, IPP, gross hematuria, nephrolithiasis, CAD, and HTN who presented with c/o lower abdominal and back pain x 2 days.  He denies F/C/N/V.  UA in ED was positive for RBCs, WBCs, and bacteria but nitrite negative.  Urine culture is pending.   CT A/P reveals bilateral hydronephrosis with no obvious obstructing stones, a right lower pole 23mm non obstructing stone, nonspecific bilateral perinephric stranding, and diffuse irregular bladder wall thickening suggestive of cystitis. Cr 2.1 on admission 07/28/16 and is 2.24 today (baseline ~1.2).  Per the pt's wife PVR was "small" and an RN performed an I/O cath for culture specimen.  Pt currently has a condom cath that is laying in the bed beside him.   Pt was followed by Dr. Junious [name] in our office until 06/2015.  He then began treatment at the [facility] and his care was subsequently transferred to Dr. Maebelle [name] at [facility].  Per the pt and his wife Dr. Valora [name] performed a cysto/TURBT in 01/2016 for bladder cancer.  He has had several f/u in-office cystos but states he was told the only treatment for his bladder cancer was cystectomy. His PSA was checked approx 1 month ago but he states he has not had any treatment for his prostate cancer since his last visit with Dr. Junious [name] in 2016.  He states he is incontinent all the time and wears diapers daily. He c/o a constant urge to void but only voids small amounts each time.  He also has chronic gross hematuria.  Pt denies any previous UTIs and has never been told that he had hydro prior to this visit.    He is currently resting comfortably but still has some mild lower abdominal discomfort.  He denies F/C, HA, CP, SOB, N/V, and  diarrhea/constipation.    Past Medical History:  Diagnosis Date  . CAD (coronary artery disease)   . Hypertension   . Kidney stones   . Prostate CA [facility])   bladder cancer  Past Surgical History:  Procedure Laterality Date  . CARDIAC SURGERY    . CORONARY ARTERY BYPASS GRAFT    . PENILE PROSTHESIS IMPLANT    . PROSTATE SURGERY    cysto/TURBT  Current Hospital Medications:  Home Meds:  Current Meds  Medication Sig  . acetaminophen (TYLENOL) 500 MG tablet Take 500-1,000 mg by mouth every 6 (six) hours as needed for mild pain.   Marland Kitchen aspirin EC 81 MG tablet Take 81 mg by mouth every morning.  . metoprolol succinate (TOPROL-XL) 50 MG 24 hr tablet Take 50 mg by mouth daily.   . nitroGLYCERIN (NITRODUR - DOSED IN MG/24 HR) 0.4 mg/hr patch Place 0.4 mg onto the skin every morning.   . olmesartan-hydrochlorothiazide (BENICAR HCT) 40-25 MG tablet Take 1 tablet by mouth daily.  . Polyvinyl Alcohol-Povidone (REFRESH OP) Place 1-2 drops into both eyes daily as needed (dry eyes).  . rosuvastatin (CRESTOR) 10 MG tablet Take 10 mg by mouth every evening.   . traMADol (ULTRAM) 50 MG tablet Take 50 mg by mouth every 6 (six) hours as needed for moderate pain.     Scheduled Meds: . cefTRIAXone (ROCEPHIN)  IV  1 g Intravenous Q24H  . feeding supplement (NEPRO CARB STEADY)  237 mL Oral BID  BM  . heparin  5,000 Units Subcutaneous Q8H  . ondansetron (ZOFRAN) IV  4 mg Intravenous Once  . pneumococcal 23 valent vaccine  0.5 mL Intramuscular Tomorrow-1000  . sodium chloride flush  3 mL Intravenous Q12H   Continuous Infusions: . sodium chloride 100 mL/hr at 07/30/16 0605   PRN Meds:.oxyCODONE  Allergies:  Allergies  Allergen Reactions  . Other Other (See Comments)    Antihistamines-unknown reaction    History reviewed. No pertinent family history.  Social History:  reports that he has never smoked. He has never used smokeless tobacco. He reports that he uses drugs, including Marijuana. He  reports that he does not drink alcohol.  ROS: A complete review of systems was performed.  All systems are negative except for pertinent findings as noted.  Physical Exam:  Vital signs in last 24 hours: Temp:  [99 F (37.2 C)-99.7 F (37.6 C)] 99.7 F (37.6 C) (08/21 0616) Pulse Rate:  [77-86] 77 (08/21 0616) Resp:  [18-19] 18 (08/21 0616) BP: (93-135)/(55-80) 96/60 (08/21 0616) SpO2:  [93 %-96 %] 96 % (08/21 0616) Weight:  [130.5 kg (287 lb 11.2 oz)] 130.5 kg (287 lb 11.2 oz) (08/21 0616) Constitutional:  Alert and oriented, No acute distress Cardiovascular: Regular rate and rhythm Respiratory: Normal respiratory effort GI: Abdomen is soft, nontender, nondistended, no abdominal masses GU: uncircum penis with no skin breakdown or rashes; condom cath underneath pt in the bed; pt has been incontinent with surrounding bedding wet; clear/yellow urine in foley bag Lymphatic: No lymphadenopathy Neurologic: Grossly intact, no focal deficits Psychiatric: Normal mood and affect  Laboratory Data:   Recent Labs  07/28/16 2324 07/28/16 2329 07/30/16 0801  WBC 13.9*  --  15.5*  HGB 11.7* 12.6* 10.0*  HCT 36.2* 37.0* 31.8*  PLT 240  --  187     Recent Labs  07/28/16 2329 07/28/16 2357 07/30/16 0801  NA 142 139 142  K 5.2* 5.3* 4.3  CL 107 108 115*  GLUCOSE 108* 105* 109*  BUN 39* 36* 35*  CALCIUM  --  9.9 8.5*  CREATININE 2.10* 2.13* 2.24*     Results for orders placed or performed during the hospital encounter of 07/28/16 (from the past 24 hour(s))  Basic metabolic panel     Status: Abnormal   Collection Time: 07/30/16  8:01 AM  Result Value Ref Range   Sodium 142 135 - 145 mmol/L   Potassium 4.3 3.5 - 5.1 mmol/L   Chloride 115 (H) 101 - 111 mmol/L   CO2 19 (L) 22 - 32 mmol/L   Glucose, Bld 109 (H) 65 - 99 mg/dL   BUN 35 (H) 6 - 20 mg/dL   Creatinine, Ser 2.24 (H) 0.61 - 1.24 mg/dL   Calcium 8.5 (L) 8.9 - 10.3 mg/dL   GFR calc non Af Amer 25 (L) >60 mL/min   GFR  calc Af Amer 29 (L) >60 mL/min   Anion gap 8 5 - 15  CBC     Status: Abnormal   Collection Time: 07/30/16  8:01 AM  Result Value Ref Range   WBC 15.5 (H) 4.0 - 10.5 K/uL   RBC 3.50 (L) 4.22 - 5.81 MIL/uL   Hemoglobin 10.0 (L) 13.0 - 17.0 g/dL   HCT 31.8 (L) 39.0 - 52.0 %   MCV 90.9 78.0 - 100.0 fL   MCH 28.6 26.0 - 34.0 pg   MCHC 31.4 30.0 - 36.0 g/dL   RDW 15.5 11.5 - 15.5 %   Platelets 187  150 - 400 K/uL   No results found for this or any previous visit (from the past 240 hour(s)).  Renal Function:  Recent Labs  07/28/16 2329 07/28/16 2357 07/30/16 0801  CREATININE 2.10* 2.13* 2.24*   Estimated Creatinine Clearance: 36.4 mL/min (by C-G formula based on SCr of 2.24 mg/dL).  Radiologic Imaging: Ct Abdomen Pelvis Wo Contrast  Result Date: 07/29/2016 CLINICAL DATA:  Acute onset of pelvic pain and increased urinary frequency. Initial encounter. EXAM: CT ABDOMEN AND PELVIS WITHOUT CONTRAST TECHNIQUE: Multidetector CT imaging of the abdomen and pelvis was performed following the standard protocol without IV contrast. COMPARISON:  CT of the abdomen and pelvis from 11/16/2014 FINDINGS: Bibasilar scarring is noted. Cystic bronchiectasis is noted at the lung bases. The patient is status post median sternotomy. A calcified granuloma is noted at the right hepatic lobe. The liver and spleen are otherwise unremarkable. The gallbladder is within normal limits. The pancreas and adrenal glands are unremarkable. Moderate right-sided and mild left-sided hydronephrosis are noted. Nonspecific perinephric stranding is noted bilaterally. No distal obstructing stones are seen. This appears to reflect the patient's relatively severe cystitis. A nonobstructing 8 mm stone is noted at the lower pole of the right kidney. No free fluid is identified. The small bowel is unremarkable in appearance. The stomach is within normal limits. No acute vascular abnormalities are seen. Scattered calcification is seen along  the abdominal aorta and its branches. The appendix is normal in caliber, without evidence of appendicitis. Scattered diverticulosis is noted along the descending and sigmoid colon, without evidence of diverticulitis. The bladder is mildly distended. There is diffuse irregular bladder wall thickening and prominent surrounding soft tissue inflammation, compatible with cystitis. Underlying mass cannot be excluded. No inguinal lymphadenopathy is seen. A penile implant is noted. No acute osseous abnormalities are identified. Anterior osteophytes are noted along the lower thoracic and lumbar spine. Vacuum phenomenon is noted at L5-S1. IMPRESSION: 1. Moderate right-sided and mild left-sided hydronephrosis noted. No distal obstructing stone seen. This appears to reflect the patient's relatively severe cystitis. 2. Diffuse irregular bladder wall thickening and prominent surrounding soft inflammation, compatible with cystitis. Underlying mass cannot be excluded. Cystoscopy would be helpful for further evaluation after completion of treatment for cystitis, as deemed clinically appropriate. 3. Bibasilar scarring and cystic bronchiectasis noted at the lung bases. 4. Nonobstructing 8 mm stone at the lower pole of the right kidney. 5. Scattered calcification along the abdominal aorta and branches. 6. Scattered diverticulosis along the descending and sigmoid colon, without evidence of diverticulitis. Electronically Signed   By: Garald Balding KevinD.   On: 07/29/2016 00:53     Impression/Recommendation  UTI--due to BOO with retention.  Continue Rocephin and f/u urine culture.  Adjust ABx as needed.  Place foley for adequate drainage.  ARI--due to above +/- dehydration.  Continue hydration and place foley for adequate drainage as well as accurate I/Os.  Monitor Cr.  Hydronephrosis--possibly reflux due to BOO with retention.  He has no evidence of obstructing stones.  Check RUS after foley placement to eval for resolution.     Bladder spasms--B/O supp prn  Pt will need to keep his scheduled f/u appt with Dr. Valora [name] on 08/10/16 if possible for continued care of his bladder and prostate cancers   [facility], [place] 07/30/2016, 12:36 PM   Agree with examination and assessment. Renal insufficiency is likely to be secondary to obstruction at the level of the bladder outlet. Foley catheter was placed this afternoon with apparent large return of  urine. Nursing staff was unable to give me an exact volume. Creatinine should improve over the next 48 hours. Follow-up should be with his urologist at [facility]. Would plan on discharge with an indwelling Foley catheter. Hydronephrosis will likely take several days to resolve with the indwelling catheter.

## 2016-07-30 NOTE — Progress Notes (Signed)
Initial Nutrition Assessment  DOCUMENTATION CODES:   Obesity unspecified  INTERVENTION:   -Continue Nepro Shake po BID, each supplement provides 425 kcal and 19 grams protein  NUTRITION DIAGNOSIS:   Inadequate oral intake related to  (altered taste perception) as evidenced by meal completion < 50%, per patient/family report.  GOAL:   Patient will meet greater than or equal to 90% of their needs  MONITOR:   PO intake, Supplement acceptance, Labs, Weight trends, Skin, I & O's  REASON FOR ASSESSMENT:   Malnutrition Screening Tool    ASSESSMENT:   82 year old male with recurrent pelvic pain x 2 days. 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 UA is positive for severe UTI and abdominal and pelvic scan is positive for severe cystitis with non-obstructive stone. Patient has past medical history of CAD, hypertension, kidney stone and prostate cancer.  Pt admitted with UTI.   Spoke with pt at bedside. He reports he was in his usual state of health up until 1-2 weeks ago. Pt experienced decreased appetite due to taste changes. Per pt, "food just wasn't tasting right". He explained that he could not eat his eggs this AM and had to put sugar in his oatmeal.   Pt reports he has experienced wt loss, but unable to quantify amount or time frame. Pt reports that he has lost approximately 4 inches off of his waistline. Per wt hx, no significant wt changes over the past year.   Nutrition-Focused physical exam completed. Findings are no fat depletion, no muscle depletion, and mild edema.   Case discussed with RN. Confirmed order for Nepro Shakes.   Labs reviewed: K: 5.3.   Diet Order:  Diet renal with fluid restriction Fluid restriction: 1800 mL Fluid; Room service appropriate? Yes; Fluid consistency: Thin  Skin:  Reviewed, no issues  Last  BM:  07/28/16  Height:   Ht Readings from Last 1 Encounters:  07/28/16 6\' 3"  (1.905 m)    Weight:   Wt Readings from Last 1 Encounters:  07/30/16 287 lb 11.2 oz (130.5 kg)    Ideal Body Weight:  89.1 kg  BMI:  Body mass index is 35.96 kg/m.  Estimated Nutritional Needs:   Kcal:  1900-2100  Protein:  90-105 grams  Fluid:  1.9-2.1 L  EDUCATION NEEDS:   Education needs no appropriate at this time  Shamica Moree A. Jimmye Norman, RD, LDN, CDE Pager: [phone] After hours Pager: [phone]

## 2016-07-31 DIAGNOSIS — Z23 Encounter for immunization: Secondary | ICD-10-CM | POA: Diagnosis not present

## 2016-07-31 LAB — URINE CULTURE: Culture: >=100000 — AB

## 2016-07-31 LAB — CBC
HEMATOCRIT: 29.2 % — AB (ref 39.0–52.0)
HEMOGLOBIN: 9.4 g/dL — AB (ref 13.0–17.0)
MCH: 28.8 pg (ref 26.0–34.0)
MCHC: 32.2 g/dL (ref 30.0–36.0)
MCV: 89.6 fL (ref 78.0–100.0)
Platelets: 178 10*3/uL (ref 150–400)
RBC: 3.26 MIL/uL — AB (ref 4.22–5.81)
RDW: 15.2 % (ref 11.5–15.5)
WBC: 13 10*3/uL — AB (ref 4.0–10.5)

## 2016-07-31 LAB — BASIC METABOLIC PANEL
ANION GAP: 8 (ref 5–15)
BUN: 30 mg/dL — AB (ref 6–20)
CHLORIDE: 113 mmol/L — AB (ref 101–111)
CO2: 19 mmol/L — ABNORMAL LOW (ref 22–32)
Calcium: 8.6 mg/dL — ABNORMAL LOW (ref 8.9–10.3)
Creatinine, Ser: 1.66 mg/dL — ABNORMAL HIGH (ref 0.61–1.24)
GFR, EST AFRICAN AMERICAN: 42 mL/min — AB (ref >60)
GFR, EST NON AFRICAN AMERICAN: 37 mL/min — AB (ref >60)
Glucose, Bld: 107 mg/dL — ABNORMAL HIGH (ref 65–99)
POTASSIUM: 4.2 mmol/L (ref 3.5–5.1)
SODIUM: 140 mmol/L (ref 135–145)

## 2016-07-31 MED ORDER — ACETAMINOPHEN 325 MG PO TABS: 650.0000 mg | ORAL_TABLET | Freq: Four times a day (QID) | ORAL | Status: DC | PRN

## 2016-07-31 MED ORDER — TRAMADOL HCL 50 MG PO TABS
50.0000 mg | ORAL_TABLET | Freq: Three times a day (TID) | ORAL | Status: DC | PRN
  Administered 2016-07-31 – 2016-08-02 (×4): 50 mg via ORAL
  Filled 2016-07-31 (×4): qty 1

## 2016-07-31 NOTE — Evaluation (Signed)
Physical Therapy Evaluation Patient Details Name: [name] MRN: [id number] DOB: 09-09-1931 Today's Date: 07/31/2016   History of Present Illness  [name] is a 82 year old male admitted with recurrent pelvic pain. Positive for severe UTI and abdominal and pelvic scan is positive for severe cystitis with non-obstructive stone. Patient has past medical history of CAD, hypertension, kidney stone and prostate cancer.  Clinical Impression  Pt admitted with the above and presents with deficits listed below. Pt with functional mobility limitations mostly due to bilateral LE pain and fatigue at this time. Lives at home with wife and was independent with ADLs PTA. Pt is motivated to participate with PT and is suspected to improve with functional mobility as pain improves. PT recommending CIR to address deficits of weakness, balance, and gait training as long as pain is well managed. Continue acute follow.   Follow Up Recommendations CIR;Supervision/Assistance - 24 hour    Equipment Recommendations   (TBD at next venue)    Recommendations for Other Services Rehab consult     Precautions / Restrictions Precautions Precautions: Fall Restrictions Weight Bearing Restrictions: No      Mobility  Bed Mobility Overal bed mobility: +2 for physical assistance;Needs Assistance Bed Mobility: Rolling;Sidelying to Sit;Sit to Sidelying Rolling: Max assist Sidelying to sit: +2 for physical assistance;Max assist     Sit to sidelying: +2 for physical assistance;Mod assist General bed mobility comments: Pt with heavy reliance on bed rail and PT for LE management and trunk elevation.  Pt reports 10/10 pain when trying to move LLE off EOB.  Transfers Overall transfer level: Needs assistance Equipment used: Rolling walker (2 wheeled) Transfers: Sit to/from Stand Sit to Stand: Max assist;+2 physical assistance         General transfer comment: x 1 attempt to stand but could not achieve  full standing to RW due to pain and weakness in bilat LEs. Pt afraid of falling during transfer.  Ambulation/Gait             General Gait Details: not attempted due to pain  Stairs            Wheelchair Mobility    Modified Rankin (Stroke Patients Only)       Balance Overall balance assessment: Needs assistance Sitting-balance support: No upper extremity supported;Feet supported Sitting balance-Leahy Scale: Fair     Standing balance support: Bilateral upper extremity supported Standing balance-Leahy Scale: Zero                               Pertinent Vitals/Pain Pain Assessment: 0-10 Pain Score: 10-Worst pain ever Pain Location: LLE>RLE, pelvic pain Pain Descriptors / Indicators: Aching;Discomfort;Shooting Pain Intervention(s): Monitored during session;Patient requesting pain meds-RN notified    Home Living Family/patient expects to be discharged to:: Private residence Living Arrangements: Spouse/significant other Available Help at Discharge: Family Type of Home: House Home Access: Level entry     Home Layout: One [place]: Environmental consultant - 4 wheels;Cane - single point      Prior Function Level of Independence: Independent         Comments: Pt states wife is available if he needs assistance.     Hand Dominance        Extremity/Trunk Assessment   Upper Extremity Assessment: Generalized weakness           Lower Extremity Assessment: Generalized weakness;RLE deficits/detail;LLE deficits/detail RLE Deficits / Details: Pt able to perform knee  flexion while in bed. LLE Deficits / Details: Pt unable to lift LE off bed due to pain.     Communication   Communication: HOH  Cognition Arousal/Alertness: Awake/alert Behavior During Therapy: WFL for tasks assessed/performed Overall Cognitive Status: Within Functional Limits for tasks assessed                      General Comments General comments (skin integrity,  edema, etc.): Pt needing BM, so attempted to transfer to [facility]. However pt unable to stand due to pain and weakness in LEs. Pt placed on bed pan and nurse notified.    Exercises        Assessment/Plan    PT Assessment Patient needs continued PT services  PT Diagnosis Generalized weakness;Acute pain;Difficulty walking   PT Problem List Decreased strength;Decreased range of motion;Decreased activity tolerance;Decreased balance;Decreased mobility;Decreased coordination;Decreased knowledge of use of DME;Pain;Obesity  PT Treatment Interventions DME instruction;Gait training;Functional mobility training;Therapeutic activities;Therapeutic exercise;Balance training;Neuromuscular re-education;Patient/family education   PT Goals (Current goals can be found in the Care Plan section) Acute Rehab PT Goals Patient Stated Goal: to get stronger PT Goal Formulation: With patient Time For Goal Achievement: 08/07/16 Potential to Achieve Goals: Good    Frequency Min 3X/week   Barriers to discharge        Co-evaluation               End of Session Equipment Utilized During Treatment: Gait belt Activity Tolerance: Patient limited by pain;Patient limited by fatigue Patient left: in bed;with bed alarm set;with call bell/phone within reach Nurse Communication: Mobility status;Patient requests pain meds;Other (comment) (pt on bed pan)         Time: [id number] PT Time Calculation (min) (ACUTE ONLY): 34 min   Charges:   PT Evaluation $PT Eval Moderate Complexity: 1 Procedure PT Treatments $Therapeutic Activity: 8-22 mins   PT G CodesDaymon Larsen 2016/08/24, 4:48 PM  Tawni Millers, SPT (student physical therapist) [place] [phone]

## 2016-07-31 NOTE — Progress Notes (Signed)
Subjective:  Complains of pain that Foley catheter site. Denies any chest pain or shortness of breath renal function slowly improving. Urine cultures still pending  Objective:  Vital Signs in the last 24 hours: Temp:  [98.2 F (36.8 C)-98.9 F (37.2 C)] 98.9 F (37.2 C) (08/22 0512) Pulse Rate:  [73-82] 74 (08/22 0512) Resp:  [18-19] 18 (08/22 0512) BP: (110-114)/(50-60) 113/60 (08/22 0512) SpO2:  [98 %-100 %] 100 % (08/22 0512) Weight:  [290 lb (131.5 kg)] 290 lb (131.5 kg) (08/22 0500)  Intake/Output from previous day: 08/21 0701 - 08/22 0700 In: 4739.3 [P.O.:876; I.V.:3583.3; NG/GT:230; IV Piggyback:50] Out: 1500 [Urine:1500] Intake/Output from this shift: No intake/output data recorded.  Physical Exam: Neck: no adenopathy, no carotid bruit, no JVD and supple, symmetrical, trachea midline Lungs: Clear to auscultation anterolaterally Heart: regular rate and rhythm, S1, S2 normal and Soft systolic murmur noted Abdomen: soft, non-tender; bowel sounds normal; no masses,  no organomegaly Extremities: extremities normal, atraumatic, no cyanosis or edema  Lab Results:  Recent Labs  07/30/16 0801 07/31/16 0535  WBC 15.5* 13.0*  HGB 10.0* 9.4*  PLT 187 178    Recent Labs  07/30/16 0801 07/31/16 0535  NA 142 140  K 4.3 4.2  CL 115* 113*  CO2 19* 19*  GLUCOSE 109* 107*  BUN 35* 30*  CREATININE 2.24* 1.66*   No results for input(s): TROPONINI in the last 72 hours.  Invalid input(s): CK, MB Hepatic Function Panel  Recent Labs  07/28/16 2357  PROT 7.4  ALBUMIN 3.4*  AST 21  ALT 13*  ALKPHOS 56  BILITOT 0.7   No results for input(s): CHOL in the last 72 hours. No results for input(s): PROTIME in the last 72 hours.  Imaging: Imaging results have been reviewed and US Renal  Result Date: 07/30/2016 CLINICAL DATA:  Hydronephrosis EXAM: RENAL / URINARY TRACT ULTRASOUND COMPLETE COMPARISON:  CT abdomen dated 07/28/2016. FINDINGS: Right Kidney: Length: 9.5 cm.  Cortical echogenicity is within normal limits. Areas of probable cortical scarring. No solid or cystic mass identified. Mild hydronephrosis, less prominent than the hydronephrosis seen on recent CT. Shadowing 8 mm stone within right renal pelvis, compatible with the stone seen on recent CT. Left Kidney: Length: 10.6 cm. Echogenicity within normal limits. No mass or hydronephrosis visualized. Bladder: Bladder decompressed by Foley catheter. IMPRESSION: 1. Mild right-sided hydronephrosis, decreased compared to the hydronephrosis seen on recent CT of 07/28/2016. 2. 8 mm right renal stone. 3. Left kidney is unremarkable without hydronephrosis. Mild hydronephrosis seen on earlier CT has resolved. 4. Bladder decompressed by Foley catheter. Electronically Signed   By: Franki Cabot [name]   On: 07/30/2016 16:51    Cardiac Studies:  Assessment/Plan:  Acute on chronic cystitis/UTI Resolving Acute renal failure with bilateral hydronephrosis. Secondary to bladder outlet obstruction Nephrolithiasis. History of prostate and bladder cancer. Coronary artery disease status post CABG Hypertension. Morbid obesity. Peripheral vascular disease. Osteoarthritis. Plan Continue slow hydration check labs in a.m. Check urine cultures PT consult Out of bed to chair  LOS: 2 days    [name] 07/31/2016, 9:06 AM

## 2016-08-01 DIAGNOSIS — N400 Enlarged prostate without lower urinary tract symptoms: Secondary | ICD-10-CM

## 2016-08-01 DIAGNOSIS — D649 Anemia, unspecified: Secondary | ICD-10-CM

## 2016-08-01 DIAGNOSIS — B9620 Unspecified Escherichia coli [E. coli] as the cause of diseases classified elsewhere: Secondary | ICD-10-CM

## 2016-08-01 DIAGNOSIS — N182 Chronic kidney disease, stage 2 (mild): Secondary | ICD-10-CM

## 2016-08-01 DIAGNOSIS — I10 Essential (primary) hypertension: Secondary | ICD-10-CM

## 2016-08-01 DIAGNOSIS — N390 Urinary tract infection, site not specified: Secondary | ICD-10-CM

## 2016-08-01 DIAGNOSIS — I25810 Atherosclerosis of coronary artery bypass graft(s) without angina pectoris: Secondary | ICD-10-CM

## 2016-08-01 DIAGNOSIS — N1330 Unspecified hydronephrosis: Secondary | ICD-10-CM

## 2016-08-01 DIAGNOSIS — N183 Chronic kidney disease, stage 3 (moderate): Secondary | ICD-10-CM

## 2016-08-01 DIAGNOSIS — N200 Calculus of kidney: Secondary | ICD-10-CM

## 2016-08-01 LAB — CBC
HEMATOCRIT: 28.5 % — AB (ref 39.0–52.0)
Hemoglobin: 9.1 g/dL — ABNORMAL LOW (ref 13.0–17.0)
MCH: 28.4 pg (ref 26.0–34.0)
MCHC: 31.9 g/dL (ref 30.0–36.0)
MCV: 89.1 fL (ref 78.0–100.0)
Platelets: 182 10*3/uL (ref 150–400)
RBC: 3.2 MIL/uL — AB (ref 4.22–5.81)
RDW: 15.4 % (ref 11.5–15.5)
WBC: 10.5 10*3/uL (ref 4.0–10.5)

## 2016-08-01 LAB — BASIC METABOLIC PANEL
Anion gap: 7 (ref 5–15)
BUN: 21 mg/dL — AB (ref 6–20)
CO2: 21 mmol/L — AB (ref 22–32)
Calcium: 8.7 mg/dL — ABNORMAL LOW (ref 8.9–10.3)
Chloride: 111 mmol/L (ref 101–111)
Creatinine, Ser: 1.22 mg/dL (ref 0.61–1.24)
GFR calc Af Amer: >60 mL/min (ref >60)
GFR calc non Af Amer: 53 mL/min — ABNORMAL LOW (ref >60)
GLUCOSE: 105 mg/dL — AB (ref 65–99)
Potassium: 4.1 mmol/L (ref 3.5–5.1)
Sodium: 139 mmol/L (ref 135–145)

## 2016-08-01 MED ORDER — AMOXICILLIN 500 MG PO CAPS
500.0000 mg | ORAL_CAPSULE | Freq: Three times a day (TID) | ORAL | Status: DC
  Administered 2016-08-01 – 2016-08-02 (×4): 500 mg via ORAL
  Filled 2016-08-01 (×7): qty 1

## 2016-08-01 NOTE — Consult Note (Signed)
Physical Medicine and Rehabilitation Consult Reason for Consult: Debilitation related to cystitis/UTI Referring Physician: [name]   HPI: [name] is a 82 y.o. right handed male with history of hypertension, CAD status post CABG, BPH with kidney stones. Per chart review patient lives with spouse. Independent with a cane /walker prior to admission. One level home. Presented 07/29/2016 with recurrent pelvic pain 2 days. CT abdomen and pelvis showed moderate right sided mild left-sided hydronephrosis. No distal obstruction seen. Diffuse irregular bladder wall thickening and prominent surrounding soft inflammation compatible with cystitis. Follow-up renal ultrasound again showed mild hydronephrosis. Urine culture greater than 100,000 aerococcus. Currently maintained on amoxicillin. Subcutaneous heparin for DVT prophylaxis. Physical therapy evaluation completed 07/31/2016 with recommendations of physical medicine rehabilitation consult.   Review of Systems  Constitutional: Negative for chills and fever.  HENT: Negative for hearing loss.   Eyes: Negative for blurred vision and double vision.  Respiratory: Negative for cough.        Occasional shortness of breath with exertion  Cardiovascular: Positive for leg swelling. Negative for chest pain and palpitations.  Gastrointestinal: Positive for constipation. Negative for nausea and vomiting.  Genitourinary: Positive for dysuria and frequency. Negative for flank pain.  Musculoskeletal: Positive for myalgias.       Occasional falls  Skin: Negative for rash.  Neurological: Positive for weakness. Negative for seizures and headaches.  All other systems reviewed and are negative.  Past Medical History:  Diagnosis Date  . CAD (coronary artery disease)   . Hypertension   . Kidney stones   . Prostate CA [facility])    Past Surgical History:  Procedure Laterality Date  . CARDIAC SURGERY    . CORONARY ARTERY BYPASS GRAFT    . PENILE  PROSTHESIS IMPLANT    . PROSTATE SURGERY     History reviewed. No pertinent family history. Social History:  reports that he has never smoked. He has never used smokeless tobacco. He reports that he uses drugs, including Marijuana. He reports that he does not drink alcohol. Allergies:  Allergies  Allergen Reactions  . Other Other (See Comments)    Antihistamines-unknown reaction   Medications Prior to Admission  Medication Sig Dispense Refill  . acetaminophen (TYLENOL) 500 MG tablet Take 500-1,000 mg by mouth every 6 (six) hours as needed for mild pain.     Marland Kitchen aspirin EC 81 MG tablet Take 81 mg by mouth every morning.    . metoprolol succinate (TOPROL-XL) 50 MG 24 hr tablet Take 50 mg by mouth daily.     . nitroGLYCERIN (NITRODUR - DOSED IN MG/24 HR) 0.4 mg/hr patch Place 0.4 mg onto the skin every morning.   3  . olmesartan-hydrochlorothiazide (BENICAR HCT) 40-25 MG tablet Take 1 tablet by mouth daily.  3  . Polyvinyl Alcohol-Povidone (REFRESH OP) Place 1-2 drops into both eyes daily as needed (dry eyes).    . rosuvastatin (CRESTOR) 10 MG tablet Take 10 mg by mouth every evening.     . traMADol (ULTRAM) 50 MG tablet Take 50 mg by mouth every 6 (six) hours as needed for moderate pain.       Home: Home Living Family/patient expects to be discharged to:: Private residence Living Arrangements: Spouse/significant other Available Help at Discharge: Family Type of Home: House Home Access: Level entry [place]: One [place]: Environmental consultant - 4 wheels, [place] - single point  Functional History: Prior Function Level of Independence: Independent Comments: Pt states wife is available if  he needs assistance. Functional Status:  Mobility: Bed Mobility Overal bed mobility: +2 for physical assistance, Needs Assistance Bed Mobility: Rolling, Sidelying to Sit Rolling: Mod assist Sidelying to sit: Mod assist, +2 for physical assistance Sit to sidelying: +2 for physical assistance, Mod  assist General bed mobility comments: assist to mobilize/stabilize L LE and elevate trunk into sitting with verbal and tactile cues for sequencing and use of rail; HOB elevated; rolled toward L side Transfers Overall transfer level: Needs assistance Equipment used: 2 person hand held assist (gait belt and bed pad) Transfers: Sit to/from Stand, Lateral/Scoot Transfers Sit to Stand: Max assist, +2 physical assistance, From elevated surface  Lateral/Scoot Transfers: Max assist, +2 physical assistance General transfer comment: sit to stand X1 from EOB with pt unable to achieve upright posture; performed lateral scoot with max +2 and use of bed pad EOB to recliner; cues for sequencing and technique Ambulation/Gait General Gait Details: not attempted due to pain    ADL:    Cognition: Cognition Overall Cognitive Status: Within Functional Limits for tasks assessed Cognition Arousal/Alertness: Awake/alert Behavior During Therapy: WFL for tasks assessed/performed Overall Cognitive Status: Within Functional Limits for tasks assessed  Blood pressure 115/66, pulse 65, temperature 99.1 F (37.3 C), temperature source Oral, resp. rate 16, height 6\' 3"  (1.905 m), weight 131.5 kg (290 lb), SpO2 99 %. Physical Exam  Vitals reviewed. Constitutional: He appears well-developed.  82 year old right-handed male. Obese  HENT:  Head: Normocephalic and atraumatic.  Eyes: Conjunctivae and EOM are normal.  Neck: Normal range of motion. Neck supple. No thyromegaly present.  Cardiovascular: Normal rate and regular rhythm.   Respiratory: Effort normal and breath sounds normal. No respiratory distress.  GI: Soft. He exhibits no distension.  Hyperactive BS  Musculoskeletal:  LE edema tenderness  Neurological: He is alert.  Patient oriented to person, place and date of birth. Follows simple commands Motor: B/l UE: 4/5 proximal to distal RLE: Hip flexion 3/5, knee extension 2+/5, ankle dorsi/plantar flexion  2/5 LLE: Hip flexion 2+/5, knee extension 2+/5, ankle dorsi/plantar flexion 2-/5  Skin: Skin is warm and dry.  Venous stasis changes b/l LE, R>L  Psychiatric: He has a normal mood and affect. His behavior is normal.    Results for orders placed or performed during the hospital encounter of 07/28/16 (from the past 24 hour(s))  Basic metabolic panel     Status: Abnormal   Collection Time: 08/01/16  5:25 AM  Result Value Ref Range   Sodium 139 135 - 145 mmol/L   Potassium 4.1 3.5 - 5.1 mmol/L   Chloride 111 101 - 111 mmol/L   CO2 21 (L) 22 - 32 mmol/L   Glucose, Bld 105 (H) 65 - 99 mg/dL   BUN 21 (H) 6 - 20 mg/dL   Creatinine, Ser 1.22 0.61 - 1.24 mg/dL   Calcium 8.7 (L) 8.9 - 10.3 mg/dL   GFR calc non Af Amer 53 (L) >60 mL/min   GFR calc Af Amer >60 >60 mL/min   Anion gap 7 5 - 15  CBC     Status: Abnormal   Collection Time: 08/01/16  5:25 AM  Result Value Ref Range   WBC 10.5 4.0 - 10.5 K/uL   RBC 3.20 (L) 4.22 - 5.81 MIL/uL   Hemoglobin 9.1 (L) 13.0 - 17.0 g/dL   HCT 28.5 (L) 39.0 - 52.0 %   MCV 89.1 78.0 - 100.0 fL   MCH 28.4 26.0 - 34.0 pg   MCHC 31.9 30.0 - 36.0  g/dL   RDW 15.4 11.5 - 15.5 %   Platelets 182 150 - 400 K/uL   US Renal  Result Date: 07/30/2016 CLINICAL DATA:  Hydronephrosis EXAM: RENAL / URINARY TRACT ULTRASOUND COMPLETE COMPARISON:  CT abdomen dated 07/28/2016. FINDINGS: Right Kidney: Length: 9.5 cm. Cortical echogenicity is within normal limits. Areas of probable cortical scarring. No solid or cystic mass identified. Mild hydronephrosis, less prominent than the hydronephrosis seen on recent CT. Shadowing 8 mm stone within right renal pelvis, compatible with the stone seen on recent CT. Left Kidney: Length: 10.6 cm. Echogenicity within normal limits. No mass or hydronephrosis visualized. Bladder: Bladder decompressed by Foley catheter. IMPRESSION: 1. Mild right-sided hydronephrosis, decreased compared to the hydronephrosis seen on recent CT of 07/28/2016. 2. 8  mm right renal stone. 3. Left kidney is unremarkable without hydronephrosis. Mild hydronephrosis seen on earlier CT has resolved. 4. Bladder decompressed by Foley catheter. Electronically Signed   By: Franki Cabot [name]   On: 07/30/2016 16:51    Assessment/Plan: Diagnosis: Debilitation  Labs and images independently reviewed.  Records reviewed and summated above.  1. Does the need for close, 24 hr/day medical supervision in concert with the patient's rehab needs make it unreasonable for this patient to be served in a less intensive setting? Yes 2. Co-Morbidities requiring supervision/potential complications: cystitis/UTI (cont abx), HTN (monitor and provide prns in accordance with increased physical exertion and pain), CAD (cont meds), BPH (cont meds), kidney stones (monitor pain), CKD (avoid nephrotoxic meds), Anemia (transfuse if necessary to ensure appropriate perfusion for increased activity tolerance), morbid obesity (Body mass index is 36.25 kg/m., diet and exercise education, encourage weight loss to increase endurance and promote overall health) 3. Due to bladder management, safety, skin/wound care, disease management, pain management and patient education, does the patient require 24 hr/day rehab nursing? Yes 4. Does the patient require coordinated care of a physician, rehab nurse, PT (1-2 hrs/day, 5 days/week) and OT (1-2 hrs/day, 5 days/week) to address physical and functional deficits in the context of the above medical diagnosis(es)? Yes Addressing deficits in the following areas: balance, endurance, locomotion, strength, transferring, bowel/bladder control, bathing, dressing, toileting and psychosocial support 5. Can the patient actively participate in an intensive therapy program of at least 3 hrs of therapy per day at least 5 days per week? Yes 6. The potential for patient to make measurable gains while on inpatient rehab is excellent 7. Anticipated functional outcomes upon discharge  from inpatient rehab are mod assist and max assist  with PT, min assist and mod assist with OT, n/a with SLP. 8. Estimated rehab length of stay to reach the above functional goals is: 19-22 days. 9. Does the patient have adequate social supports and living environment to accommodate these discharge functional goals? Potentially 10. Anticipated D/C setting: Home 11. Anticipated post D/C treatments: HH therapy and Home excercise program 12. Overall Rehab/Functional Prognosis: good  RECOMMENDATIONS: This patient's condition is appropriate for continued rehabilitative care in the following setting: Pt require max assist for therapies.  If caregiver support available at discharge, would recommend CIR, however, his wife alone, will unlikely be able to care for pt independently after a short IRF stay.  Patient has agreed to participate in recommended program. Yes Note that insurance prior authorization may be required for reimbursement for recommended care.  Comment: Rehab Admissions Coordinator to follow up.  Delice Lesch, MD 08/01/2016

## 2016-08-01 NOTE — Progress Notes (Signed)
Physical Therapy Treatment Patient Details Name: [name] MRN: [id number] DOB: 1931/01/14 Today's Date: 08/01/2016    History of Present Illness ZAKEE BRUNER is a 82 year old male admitted with recurrent pelvic pain. Positive for severe UTI and abdominal and pelvic scan is positive for severe cystitis with non-obstructive stone. Patient has past medical history of CAD, hypertension, kidney stone and prostate cancer.    PT Comments    Patient continues to require +2 for mobility and unable to achieve standing due to c/o pain in bilat LE (L>R). Lateral scoot to recliner this session. Continue to progress as tolerated.   Follow Up Recommendations  CIR;Supervision/Assistance - 24 hour     Equipment Recommendations   (TBD at next venue)    Recommendations for Other Services Rehab consult     Precautions / Restrictions Precautions Precautions: Fall Restrictions Weight Bearing Restrictions: No    Mobility  Bed Mobility Overal bed mobility: +2 for physical assistance;Needs Assistance Bed Mobility: Rolling;Sidelying to Sit Rolling: Mod assist Sidelying to sit: Mod assist;+2 for physical assistance       General bed mobility comments: assist to mobilize/stabilize L LE and elevate trunk into sitting with verbal and tactile cues for sequencing and use of rail; HOB elevated; rolled toward L side  Transfers Overall transfer level: Needs assistance Equipment used: 2 person hand held assist (gait belt and bed pad) Transfers: Sit to/from Stand;Lateral/Scoot Transfers Sit to Stand: Max assist;+2 physical assistance;From elevated surface        Lateral/Scoot Transfers: Max assist;+2 physical assistance General transfer comment: sit to stand X1 from EOB with pt unable to achieve upright posture; performed lateral scoot with max +2 and use of bed pad EOB to recliner; cues for sequencing and technique  Ambulation/Gait                 Stairs             Wheelchair Mobility    Modified Rankin (Stroke Patients Only)       Balance     Sitting balance-Leahy Scale: Fair       Standing balance-Leahy Scale: Zero                      Cognition Arousal/Alertness: Awake/alert Behavior During Therapy: WFL for tasks assessed/performed Overall Cognitive Status: Within Functional Limits for tasks assessed                      Exercises      General Comments General comments (skin integrity, edema, etc.): pt without O2 througout session and SpO2 remained 95% or > end of session       Pertinent Vitals/Pain Pain Assessment: 0-10 Pain Score: 10-Worst pain ever Pain Location: R ankle and L LE with weight bearing Pain Descriptors / Indicators: Sore;Aching Pain Intervention(s): Limited activity within patient's tolerance;Monitored during session;Repositioned    Home Living                      Prior Function            PT Goals (current goals can now be found in the care plan section) Acute Rehab PT Goals Patient Stated Goal: walk again PT Goal Formulation: With patient Time For Goal Achievement: 08/07/16 Potential to Achieve Goals: Good Progress towards PT goals: Progressing toward goals    Frequency  Min 3X/week    PT Plan      Co-evaluation  End of Session Equipment Utilized During Treatment: Gait belt Activity Tolerance: Patient limited by pain Patient left: with call bell/phone within reach;in chair     Time: [id number] PT Time Calculation (min) (ACUTE ONLY): 31 min  Charges:  $Therapeutic Activity: 23-37 mins                    G Codes:      Salina April, PTA Pager: [phone]   08/01/2016, 12:10 PM

## 2016-08-01 NOTE — NC FL2 (Signed)
[place] LEVEL OF CARE SCREENING TOOL     IDENTIFICATION  Patient Name: [name] Birthdate: March 19, 1931 Sex: male Admission Date (Current Location): 07/28/2016  [facility] and [place] Number:  Herbalist and Address:  [facility], [place] [address], [place], [place] [address]      Provider Number: [id number]  Attending Physician Name and Address:  [name], MD  Relative Name and Phone Number:  Danton Clap [phone]    Current Level of Care: Hospital Recommended Level of Care: [facility] Prior Approval Number:    Date Approved/Denied:   PASRR Number: [id number]  Discharge Plan: SNF    Current Diagnoses: Patient Active Problem List   Diagnosis Date Noted  . Bilateral hydronephrosis   . Normochromic normocytic anemia   . E-coli UTI   . Benign essential HTN   . Coronary artery disease involving coronary bypass graft of native heart without angina pectoris   . BPH (benign prostatic hyperplasia)   . Kidney stones   . CKD (chronic kidney disease)   . Morbid obesity due to excess calories ([place])   . Acute renal failure (ARF) ([place]) 07/29/2016    Orientation RESPIRATION BLADDER Height & Weight     Self, Time, Situation, Place  Normal Continent, Indwelling catheter (Urinary catheter) Weight: 131.5 kg (290 lb) Height:  6\' 3"  (190.5 cm)  BEHAVIORAL SYMPTOMS/MOOD NEUROLOGICAL BOWEL NUTRITION STATUS      Continent Diet (Please see DC Summary)  AMBULATORY STATUS COMMUNICATION OF NEEDS Skin   Extensive Assist Verbally Normal                       Personal Care Assistance Level of Assistance  Bathing, Feeding, Dressing Bathing Assistance: Maximum assistance Feeding assistance: Limited assistance Dressing Assistance: Limited assistance     Functional Limitations Info  Hearing   Hearing Info: Impaired (Hearing aids)      [place]  PT (By licensed PT)     PT Frequency:  5x/week              Contractures      Additional Factors Info  Code Status, Allergies Code Status Info: Full Allergies Info: Other           Current Medications (08/01/2016):  This is the current hospital active medication list Current Facility-Administered Medications  Medication Dose Route Frequency Provider Last Rate Last Dose  . acetaminophen (TYLENOL) tablet 650 mg  650 mg Oral Q6H PRN [name], MD      . amoxicillin (AMOXIL) capsule 500 mg  500 mg Oral Q8H [name], MD   500 mg at 08/01/16 1537  . feeding supplement (NEPRO CARB STEADY) liquid 237 mL  237 mL Oral BID BM [name], MD   237 mL at 08/01/16 1400  . heparin injection 5,000 Units  5,000 Units Subcutaneous Q8H Dixie Dials, MD   5,000 Units at 08/01/16 1310  . ondansetron (ZOFRAN) injection 4 mg  4 mg Intravenous Once Deno Etienne, DO      . opium-belladonna (B&O SUPPRETTES) 16.2-60 MG suppository 1 suppository  1 suppository Rectal Q6H PRN Debbrah Alar, PA-C      . sodium chloride flush (NS) 0.9 % injection 3 mL  3 mL Intravenous Q12H Dixie Dials, MD      . traMADol (ULTRAM) tablet 50 mg  50 mg Oral Q8H PRN [name], MD   50 mg at 08/01/16 1536  Discharge Medications: Please see discharge summary for a list of discharge medications.  Relevant Imaging Results:  Relevant Lab Results:   Additional Information SSN: [place], [place]

## 2016-08-01 NOTE — Progress Notes (Signed)
During change of shift, pt complained about wanting Tramadol as his pain med. MD was called, he gave a verbal order to d/c Oxycodone and start 50mg  q8 PRN Tramadol for pt. Pt is sleeping well. Will continue to monitor.

## 2016-08-01 NOTE — Progress Notes (Signed)
Rehab Admissions Coordinator Note:  Patient was screened by Retta Diones for appropriateness for an Inpatient Acute Rehab Consult.  At this time, we are recommending Inpatient Rehab consult.  Retta Diones 08/01/2016, 8:32 AM  I can be reached at [phone].

## 2016-08-01 NOTE — Progress Notes (Signed)
Subjective:  Denies any chest pain or shortness of breath up in chairs states feeling better today but still not able to walk. Urine culture positive for aerococcus, cases reported in an elderly people with bladder carcinoma.  Objective:  Vital Signs in the last 24 hours: Temp:  [99.1 F (37.3 C)-99.4 F (37.4 C)] 99.1 F (37.3 C) (08/23 0542) Pulse Rate:  [65-81] 65 (08/23 0542) Resp:  [16-20] 16 (08/23 0542) BP: (115-121)/(61-66) 115/66 (08/23 0542) SpO2:  [99 %] 99 % (08/23 0542) Weight:  [290 lb (131.5 kg)] 290 lb (131.5 kg) (08/23 0541)  Intake/Output from previous day: 08/22 0701 - 08/23 0700 In: 2981.7 [P.O.:1080; I.V.:1851.7; IV Piggyback:50] Out: 1650 [Urine:1650] Intake/Output from this shift: No intake/output data recorded.  Physical Exam: Neck: no adenopathy, no carotid bruit, no JVD and supple, symmetrical, trachea midline Lungs: clear to auscultation bilaterally Heart: regular rate and rhythm, S1, S2 normal and Soft Systolic murmur noted Abdomen: soft, non-tender; bowel sounds normal; no masses,  no organomegaly Extremities: extremities normal, atraumatic, no cyanosis or edema  Lab Results:  Recent Labs  07/31/16 0535 08/01/16 0525  WBC 13.0* 10.5  HGB 9.4* 9.1*  PLT 178 182    Recent Labs  07/31/16 0535 08/01/16 0525  NA 140 139  K 4.2 4.1  CL 113* 111  CO2 19* 21*  GLUCOSE 107* 105*  BUN 30* 21*  CREATININE 1.66* 1.22   No results for input(s): TROPONINI in the last 72 hours.  Invalid input(s): CK, MB Hepatic Function Panel No results for input(s): PROT, ALBUMIN, AST, ALT, ALKPHOS, BILITOT, BILIDIR, IBILI in the last 72 hours. No results for input(s): CHOL in the last 72 hours. No results for input(s): PROTIME in the last 72 hours.  Imaging: Imaging results have been reviewed and US Renal  Result Date: 07/30/2016 CLINICAL DATA:  Hydronephrosis EXAM: RENAL / URINARY TRACT ULTRASOUND COMPLETE COMPARISON:  CT abdomen dated 07/28/2016.  FINDINGS: Right Kidney: Length: 9.5 cm. Cortical echogenicity is within normal limits. Areas of probable cortical scarring. No solid or cystic mass identified. Mild hydronephrosis, less prominent than the hydronephrosis seen on recent CT. Shadowing 8 mm stone within right renal pelvis, compatible with the stone seen on recent CT. Left Kidney: Length: 10.6 cm. Echogenicity within normal limits. No mass or hydronephrosis visualized. Bladder: Bladder decompressed by Foley catheter. IMPRESSION: 1. Mild right-sided hydronephrosis, decreased compared to the hydronephrosis seen on recent CT of 07/28/2016. 2. 8 mm right renal stone. 3. Left kidney is unremarkable without hydronephrosis. Mild hydronephrosis seen on earlier CT has resolved. 4. Bladder decompressed by Foley catheter. Electronically Signed   By: Franki Cabot [name]   On: 07/30/2016 16:51    Cardiac Studies:  Assessment/Plan:  Aerococcus  UTI Status post Acute renal failure with bilateral hydronephrosis. Secondary to bladder outlet obstruction Nephrolithiasis. History of prostate and bladder cancer. Coronary artery disease status post CABG Hypertension. Morbid obesity. Peripheral vascular disease. Osteoarthritis. Plan Change IV Rocephin to amoxicillin 500 mg every 8 hours Increase ambulation Social service for discharge planning  LOS: 3 days    [name] 08/01/2016, 11:50 AM

## 2016-08-01 NOTE — Care Management Important Message (Signed)
Important Message  Patient Details  Name: QUINTELL MATTERS MRN: [id number] Date of Birth: 1931/03/19   Medicare Important Message Given:  Yes    Staria Birkhead Abena 08/01/2016, 11:25 AM

## 2016-08-02 ENCOUNTER — Inpatient Hospital Stay (HOSPITAL_COMMUNITY): Payer: Medicare Other

## 2016-08-02 ENCOUNTER — Inpatient Hospital Stay (HOSPITAL_COMMUNITY)
Admission: RE | Admit: 2016-08-02 | Discharge: 2016-08-16 | DRG: 948 | Disposition: A | Discharge status: Home Health | Payer: Medicare Other | Source: Intra-hospital | Attending: Physical Medicine & Rehabilitation | Admitting: Physical Medicine & Rehabilitation

## 2016-08-02 DIAGNOSIS — N179 Acute kidney failure, unspecified: Secondary | ICD-10-CM

## 2016-08-02 DIAGNOSIS — N1330 Unspecified hydronephrosis: Secondary | ICD-10-CM | POA: Diagnosis present

## 2016-08-02 DIAGNOSIS — Z951 Presence of aortocoronary bypass graft: Secondary | ICD-10-CM | POA: Diagnosis not present

## 2016-08-02 DIAGNOSIS — M25462 Effusion, left knee: Secondary | ICD-10-CM | POA: Diagnosis present

## 2016-08-02 DIAGNOSIS — D72829 Elevated white blood cell count, unspecified: Secondary | ICD-10-CM

## 2016-08-02 DIAGNOSIS — B029 Zoster without complications: Secondary | ICD-10-CM | POA: Diagnosis not present

## 2016-08-02 DIAGNOSIS — D62 Acute posthemorrhagic anemia: Secondary | ICD-10-CM | POA: Diagnosis not present

## 2016-08-02 DIAGNOSIS — N189 Chronic kidney disease, unspecified: Secondary | ICD-10-CM

## 2016-08-02 DIAGNOSIS — M10062 Idiopathic gout, left knee: Secondary | ICD-10-CM

## 2016-08-02 DIAGNOSIS — M25571 Pain in right ankle and joints of right foot: Secondary | ICD-10-CM | POA: Diagnosis present

## 2016-08-02 DIAGNOSIS — N433 Hydrocele, unspecified: Secondary | ICD-10-CM | POA: Diagnosis present

## 2016-08-02 DIAGNOSIS — D638 Anemia in other chronic diseases classified elsewhere: Secondary | ICD-10-CM | POA: Diagnosis not present

## 2016-08-02 DIAGNOSIS — N3001 Acute cystitis with hematuria: Secondary | ICD-10-CM

## 2016-08-02 DIAGNOSIS — R32 Unspecified urinary incontinence: Secondary | ICD-10-CM | POA: Diagnosis not present

## 2016-08-02 DIAGNOSIS — R5381 Other malaise: Secondary | ICD-10-CM

## 2016-08-02 DIAGNOSIS — Z888 Allergy status to other drugs, medicaments and biological substances status: Secondary | ICD-10-CM | POA: Diagnosis not present

## 2016-08-02 DIAGNOSIS — N390 Urinary tract infection, site not specified: Secondary | ICD-10-CM

## 2016-08-02 DIAGNOSIS — D649 Anemia, unspecified: Secondary | ICD-10-CM

## 2016-08-02 DIAGNOSIS — E785 Hyperlipidemia, unspecified: Secondary | ICD-10-CM | POA: Diagnosis present

## 2016-08-02 DIAGNOSIS — R52 Pain, unspecified: Secondary | ICD-10-CM

## 2016-08-02 DIAGNOSIS — T380X5A Adverse effect of glucocorticoids and synthetic analogues, initial encounter: Secondary | ICD-10-CM | POA: Diagnosis present

## 2016-08-02 DIAGNOSIS — Z79899 Other long term (current) drug therapy: Secondary | ICD-10-CM

## 2016-08-02 DIAGNOSIS — N3090 Cystitis, unspecified without hematuria: Secondary | ICD-10-CM | POA: Diagnosis present

## 2016-08-02 DIAGNOSIS — Z7982 Long term (current) use of aspirin: Secondary | ICD-10-CM

## 2016-08-02 DIAGNOSIS — I158 Other secondary hypertension: Secondary | ICD-10-CM

## 2016-08-02 DIAGNOSIS — I10 Essential (primary) hypertension: Secondary | ICD-10-CM | POA: Diagnosis present

## 2016-08-02 DIAGNOSIS — L259 Unspecified contact dermatitis, unspecified cause: Secondary | ICD-10-CM

## 2016-08-02 DIAGNOSIS — I2510 Atherosclerotic heart disease of native coronary artery without angina pectoris: Secondary | ICD-10-CM | POA: Diagnosis present

## 2016-08-02 DIAGNOSIS — R339 Retention of urine, unspecified: Secondary | ICD-10-CM

## 2016-08-02 DIAGNOSIS — M792 Neuralgia and neuritis, unspecified: Secondary | ICD-10-CM

## 2016-08-02 LAB — CBC
HCT: 30.1 % — ABNORMAL LOW (ref 39.0–52.0)
Hemoglobin: 9.8 g/dL — ABNORMAL LOW (ref 13.0–17.0)
MCH: 28.4 pg (ref 26.0–34.0)
MCHC: 32.6 g/dL (ref 30.0–36.0)
MCV: 87.2 fL (ref 78.0–100.0)
PLATELETS: 231 10*3/uL (ref 150–400)
RBC: 3.45 MIL/uL — ABNORMAL LOW (ref 4.22–5.81)
RDW: 14.7 % (ref 11.5–15.5)
WBC: 10 10*3/uL (ref 4.0–10.5)

## 2016-08-02 LAB — SEDIMENTATION RATE: Sed Rate: 119 mm/hr — ABNORMAL HIGH (ref 0–16)

## 2016-08-02 LAB — URIC ACID: Uric Acid, Serum: 6.6 mg/dL (ref 4.4–7.6)

## 2016-08-02 LAB — CREATININE, SERUM
CREATININE: 1.16 mg/dL (ref 0.61–1.24)
GFR calc Af Amer: >60 mL/min (ref >60)
GFR, EST NON AFRICAN AMERICAN: 56 mL/min — AB (ref >60)

## 2016-08-02 MED ORDER — ONDANSETRON HCL 4 MG/2ML IJ SOLN: 4.0000 mg | Freq: Four times a day (QID) | INTRAMUSCULAR | Status: DC | PRN

## 2016-08-02 MED ORDER — HEPARIN SODIUM (PORCINE) 5000 UNIT/ML IJ SOLN
5000.0000 [IU] | Freq: Three times a day (TID) | INTRAMUSCULAR | Status: DC
  Administered 2016-08-02 – 2016-08-16 (×40): 5000 [IU] via SUBCUTANEOUS
  Filled 2016-08-02 (×41): qty 1

## 2016-08-02 MED ORDER — TRAMADOL HCL 50 MG PO TABS
50.0000 mg | ORAL_TABLET | Freq: Three times a day (TID) | ORAL | Status: DC | PRN
  Administered 2016-08-02 – 2016-08-03 (×3): 50 mg via ORAL
  Filled 2016-08-02 (×3): qty 1

## 2016-08-02 MED ORDER — NEPRO/CARBSTEADY PO LIQD
237.0000 mL | Freq: Two times a day (BID) | ORAL | Status: DC
  Administered 2016-08-03 – 2016-08-08 (×11): 237 mL via ORAL

## 2016-08-02 MED ORDER — ONDANSETRON HCL 4 MG PO TABS
4.0000 mg | ORAL_TABLET | Freq: Four times a day (QID) | ORAL | Status: DC | PRN
  Administered 2016-08-07: 4 mg via ORAL
  Filled 2016-08-02: qty 1

## 2016-08-02 MED ORDER — AMOXICILLIN 500 MG PO CAPS
500.0000 mg | ORAL_CAPSULE | Freq: Three times a day (TID) | ORAL | Status: DC
  Administered 2016-08-02 – 2016-08-10 (×23): 500 mg via ORAL
  Filled 2016-08-02 (×23): qty 1

## 2016-08-02 MED ORDER — SORBITOL 70 % SOLN
30.0000 mL | Freq: Every day | Status: DC | PRN
  Administered 2016-08-05 – 2016-08-15 (×3): 30 mL via ORAL
  Filled 2016-08-02 (×4): qty 30

## 2016-08-02 MED ORDER — ACETAMINOPHEN 325 MG PO TABS
650.0000 mg | ORAL_TABLET | Freq: Four times a day (QID) | ORAL | Status: DC | PRN
  Administered 2016-08-10 – 2016-08-15 (×5): 650 mg via ORAL
  Filled 2016-08-02 (×5): qty 2

## 2016-08-02 MED ORDER — METOPROLOL SUCCINATE ER 25 MG PO TB24: 25.0000 mg | ORAL_TABLET | Freq: Every day | ORAL | 3 refills | Status: DC

## 2016-08-02 MED ORDER — AMOXICILLIN 500 MG PO CAPS: 500.0000 mg | ORAL_CAPSULE | Freq: Three times a day (TID) | ORAL | 0 refills | Status: DC

## 2016-08-02 MED ORDER — HEPARIN SODIUM (PORCINE) 5000 UNIT/ML IJ SOLN: 5000.0000 [IU] | Freq: Three times a day (TID) | INTRAMUSCULAR | Status: DC

## 2016-08-02 NOTE — Discharge Planning (Addendum)
Transferring to 4W Room 3.  Reported given to Dorien Chihuahua, RN.  Still awaiting room to be cleared and cleaned. - 4W anticipating between 5 and 6PM.  Will call us when it's ready. Discharge papers given, explained and educated.  RN assessment and VS revealed stability for Discharge to Rehab.

## 2016-08-02 NOTE — Progress Notes (Signed)
I met with pt and his wife at bedside. Both are in agreement to admit to inpt rehab today. I have bed availble and will make the arrangements to admit today. RN CM and SW are aware. [phone]

## 2016-08-02 NOTE — Progress Notes (Signed)
Pt has been successfully transfer to 4W room 3 by the charge nurse trimaine and a nurse tech

## 2016-08-02 NOTE — Progress Notes (Signed)
I met with pt at bedside to discuss a possible inpt rehab admission. He prefers an inpt rehab admission rather than SNF. Pt has two adult sons who live in the home with he and his wife who can assist in his care. I have placed a call to his wife to discuss CIR admission and await her call back. I have discussed with RN [place] and SW. [phone]

## 2016-08-02 NOTE — Progress Notes (Signed)
Cristina Gong, RN Rehab Admission Coordinator Signed Physical Medicine and Rehabilitation  PMR Pre-admission Date of Service: 08/02/2016 12:18 PM  Related encounter: ED to Hosp-Admission (Current) from 07/28/2016 in [place]       [] Hide copied text PMR Admission Coordinator Pre-Admission Assessment  Patient: [name] is an 82 y.o., male MRN: [id number] DOB: 10/11/1931 Height: 6\' 3"  (190.5 cm) Weight: 129.8 kg (286 lb 3.2 oz)                                                                                                                                                                                                                                                                          Insurance Information HMO:     PPO:      PCP:      IPA:      80/20: yes     OTHER: no HMO PRIMARY: Medicare a and b      Policy#: [id number]      Subscriber: pt Benefits:  Phone #: passport one online     Name: 08/02/16 Eff. Date: 06/09/90     Deduct: $1316     Out of Pocket Max: none      Life Max: none CIR: 100%      SNF: 20 full days Outpatient: 80%     Co-Pay: 20% Home Health: 100%      Co-Pay: none DME: 80%     Co-Pay: 20% Providers: pt choice  SECONDARY: G[place]      Policy#: [id number]      Subscriber: pt  Medicaid Application Date:       Case Manager:  Disability Application Date:       Case Worker:   [facility]    Name Relation Home Work Mobile   [place] Spouse   [phone]     Current Medical History  Patient Admitting Diagnosis: debilitation  History of Present Illness:Kevin L Spenceris a 82 y.o.right handed malewith history of hypertension, CAD status post CABG, BPH with kidney stones. Presented 07/29/2016 with recurrent pelvic pain 2 days. CT abdomen and pelvis showed moderate right sided mild left-sided hydronephrosis.  No distal obstruction seen. Diffuse irregular bladder  wall thickening and prominent surrounding soft inflammation compatible with cystitis. Follow-up renal ultrasound again showed mild hydronephrosis. Urine culture greater than 100,000 aerococcus. Currently maintained on amoxicillin. Subcutaneous heparin for DVT prophylaxis.  Past Medical History      Past Medical History:  Diagnosis Date  . CAD (coronary artery disease)   . Hypertension   . Kidney stones   . Prostate CA [facility])     Family History  family history is not on file.  Prior Rehab/Hospitalizations:  Has the patient had major surgery during 100 days prior to admission? No  Current Medications   Current Facility-Administered Medications:  .  acetaminophen (TYLENOL) tablet 650 mg, 650 mg, Oral, Q6H PRN, [name], MD .  amoxicillin (AMOXIL) capsule 500 mg, 500 mg, Oral, Q8H, [name], MD, 500 mg at 08/02/16 0645 .  feeding supplement (NEPRO CARB STEADY) liquid 237 mL, 237 mL, Oral, BID BM, [name], MD, 237 mL at 08/01/16 1400 .  heparin injection 5,000 Units, 5,000 Units, Subcutaneous, Q8H, Dixie Dials, MD, 5,000 Units at 08/02/16 0645 .  ondansetron (ZOFRAN) injection 4 mg, 4 mg, Intravenous, Once, Deno Etienne, DO .  opium-belladonna (B&O SUPPRETTES) 16.2-60 MG suppository 1 suppository, 1 suppository, Rectal, Q6H PRN, Estill Bamberg Dancy, PA-C .  sodium chloride flush (NS) 0.9 % injection 3 mL, 3 mL, Intravenous, Q12H, Dixie Dials, MD, 3 mL at 08/01/16 2214 .  traMADol (ULTRAM) tablet 50 mg, 50 mg, Oral, Q8H PRN, [name], MD, 50 mg at 08/02/16 0808  Patients Current Diet: Diet renal with fluid restriction Fluid restriction: 1800 mL Fluid; Room service appropriate? Yes; Fluid consistency: Thin  Precautions / Restrictions Precautions Precautions: Fall Restrictions Weight Bearing Restrictions: No   Has the patient had 2 or more falls or a fall with injury in the past year?No  Prior Activity Level Limited Community (1-2x/wk): pt does not drive and  limited community access since 1/17 due to bladder incontinence and frequency  Pt dx with bladder Ca 1/17. Has been limited community due to urinary urgency, frequency and incontinence. Wears depends. Was Mod I with cane and rollator in the home. Pt has seat in his tub with hand held shower. His wife asissted with bathing lower body and dressing since 1/17. Pt was Mod I with cane and rollarot in the home.  Home Assistive Devices / Equipment Home Assistive Devices/Equipment: Grab bars around toilet, Hand-held shower hose, Grab bars in shower, Shower chair with back, Radio producer (specify quad or straight), Wheelchair (Straight cane.) Home Equipment: Environmental consultant - 4 wheels, Cane - single point  Prior Device Use: Indicate devices/aids used by the patient prior to current illness, exacerbation or injury? Walker and cane  Prior Functional Level Prior Function Level of Independence: Independent with assistive device(s) (used cand and rollator as needed) Comments: wife and adults son, [name] can assist in the home Pt with hisotry of bladder Cancer diagnosed 1/17. After Self Care: Did the patient need help bathing, dressing, using the toilet or eating?  Needed some help  Indoor Mobility: Did the patient need assistance with walking from room to room (with or without device)? Independent  Stairs: Did the patient need assistance with internal or external stairs (with or without device)? Needed some help  Functional Cognition: Did the patient need help planning regular tasks such as shopping or remembering to take medications? Needed some help  Current Functional Level Cognition Overall Cognitive Status: Within Functional Limits for tasks assessed    Extremity  Assessment (includes Sensation/Coordination) Upper Extremity Assessment: Generalized weakness  Lower Extremity Assessment: Generalized weakness, RLE deficits/detail, LLE deficits/detail RLE Deficits / Details: Pt able to perform knee flexion while  in bed. LLE Deficits / Details: Pt unable to lift LE off bed due to pain.   ADLs     Mobility Overal bed mobility: +2 for physical assistance, Needs Assistance Bed Mobility: Rolling, Sidelying to Sit Rolling: Mod assist Sidelying to sit: Mod assist, +2 for physical assistance Sit to sidelying: +2 for physical assistance, Mod assist General bed mobility comments: assist to mobilize/stabilize L LE and elevate trunk into sitting with verbal and tactile cues for sequencing and use of rail; HOB elevated; rolled toward L side   Transfers Overall transfer level: Needs assistance Equipment used: 2 person hand held assist (gait belt and bed pad) Transfers: Sit to/from Stand, Lateral/Scoot Transfers Sit to Stand: Max assist, +2 physical assistance, From elevated surface  Lateral/Scoot Transfers: Max assist, +2 physical assistance General transfer comment: sit to stand X1 from EOB with pt unable to achieve upright posture; performed lateral scoot with max +2 and use of bed pad EOB to recliner; cues for sequencing and technique   Ambulation / Gait / Stairs / Wheelchair Mobility Ambulation/Gait General Gait Details: not attempted due to pain   Posture / Balance Balance Overall balance assessment: Needs assistance Sitting-balance support: No upper extremity supported, Feet supported Sitting balance-Leahy Scale: Fair Standing balance support: Bilateral upper extremity supported Standing balance-Leahy Scale: Zero   Special needs/care consideration Skin dry flaky skin                           Bowel mgmt: continent LBM 07/31/16 Bladder mgmt: foley placed this admission and urology recommends to d/c home with foley. Pt has had urgency, frequency and incontinence since bladder CA diagnosis 12/2015 Diabetic mgmt no   Previous Home Environment Living Arrangements: Spouse/significant other (adult son, [name] lives in the home and adult son, [name] is )  Lives With: Spouse, Family Available Help at Discharge:  Family, Available 24 hours/day Type of Home: House Home Layout: One level Home Access: Stairs to enter Entrance Stairs-Rails: None Entrance Stairs-Number of Steps: 1 step Bathroom Shower/Tub: Tub/shower unit, Industrial/product designer: Yes How Accessible: Accessible via walker [place]: No Additional Comments: [facility] is already working with pt on making accomodations to his bathroom  Discharge Living Setting Plans for Discharge Living Setting: Patient's home, Lives with (comment) (wife and adult son, [name]) Type of Home at Discharge: House Discharge Home Layout: One level Discharge Home Access: Stairs to enter Entrance Stairs-Rails: None Entrance Stairs-Number of Steps: 1 small step Discharge Bathroom Shower/Tub: Tub/shower unit, Curtain Discharge Bathroom Toilet: Standard Discharge Bathroom Accessibility: Yes How Accessible: Accessible via walker Does the patient have any problems obtaining your medications?: No  Social/Family/Support Systems Patient Roles: Spouse, Parent Contact Information: [name], wife Anticipated Caregiver: wife, sons [name] and [name] to assist Anticipated Caregiver's Contact Information: see above. no landline, just cell Ability/Limitations of Caregiver: wife 11 years old but very active and independent Caregiver Availability: 24/7 Discharge Plan Discussed with Primary Caregiver: Yes Is Caregiver In Agreement with Plan?: Yes Does Caregiver/Family have Issues with Lodging/Transportation while Pt is in Rehab?: No   Patient lives with his wife. Son , [name] has lived with them for about 6 years but plans to move out to be with his girlfriend this weekend. He has just completed a drug rehab program, has  a job and the plan has been for him to go live with his GF. Son, [name], has been living with pt for one month. He has CHF and lost his job. He is physically able to assist. Wife is 61 years old and very active. Provides  care to 80 yo and 80 yo great grand children in the home, but they return to school 8/28. Wife states [name] and [name] can assist in the care of pt as she requests. VA is assisting to begin renovations in the future to pt's bathroom once funding provided.  Goals/Additional Needs Patient/Family Goal for Rehab: Min to Mod assist with PT and OT Expected length of stay: ELOS 19-22 days Additional Information: Pt HOH and has hearing aids . Also wears glasses Pt/Family Agrees to Admission and willing to participate: Yes Program Orientation Provided & Reviewed with Pt/Caregiver Including Roles  & Responsibilities: Yes  Decrease burden of Care through IP rehab admission: n/a  Possible need for SNF placement upon discharge:not anticipated, but I explained to pt and wife at bedside 8/24 pta that if pt does not reach level to d/c home or sons unable to assist, that further rehab could be recommended at a SNF.  Patient Condition: This patient's medical and functional status has changed since the consult dated _8/23/2017 in which the Rehabilitation Physician determined and documented that the patient was potentially appropriate for intensive rehabilitative care in an inpatient rehabilitation facility. Issues have been addressed and update has been discussed with Dr. Letta Pate  and patient now appropriate for inpatient rehabilitation. Wife has confirmed that pt's two adult sons can assist in providing physical care needed in the home as needed. Will admit to inpatient rehab today.    Preadmission Screen Completed By:  Cleatrice Burke, 08/02/2016 12:40 PM ______________________________________________________________________   Discussed status with Dr. Letta Pate on 08/02/2016 at 1236 and received telephone approval for admission today.  Admission Coordinator:  Cleatrice Burke, time [id number] Date 08/02/2016       Cosigned by: Charlett Blake, MD at 08/02/2016 1:05 PM  Revision History

## 2016-08-02 NOTE — Discharge Instructions (Signed)
Acute Kidney Injury °Acute kidney injury is any condition in which there is sudden (acute) damage to the kidneys. Acute kidney injury was previously known as acute kidney failure or acute renal failure. The kidneys are two organs that lie on either side of the spine between the middle of the back and the front of the abdomen. The kidneys: °· Remove wastes and extra water from the blood.   °· Produce important hormones. These help keep bones strong, regulate blood pressure, and help create red blood cells.   °· Balance the fluids and chemicals in the blood and tissues. °A small amount of kidney damage may not cause problems, but a large amount of damage may make it difficult or impossible for the kidneys to work the way they should. Acute kidney injury may develop into long-lasting (chronic) kidney disease. It may also develop into a life-threatening disease called end-stage kidney disease. Acute kidney injury can get worse very quickly, so it should be treated right away. Early treatment may prevent other kidney diseases from developing. °CAUSES  °· A problem with blood flow to the kidneys. This may be caused by:   °¨ Blood loss.   °¨ Heart disease.   °¨ Severe burns.   °¨ Liver disease. °· Direct damage to the kidneys. This may be caused by: °¨ Some medicines.   °¨ A kidney infection.   °¨ Poisoning or consuming toxic substances.   °¨ A surgical wound.   °¨ A blow to the kidney area.   °· A problem with urine flow. This may be caused by:   °¨ Cancer.   °¨ Kidney stones.   °¨ An enlarged prostate. °SIGNS AND SYMPTOMS  °· Swelling (edema) of the legs, ankles, or feet.   °· Tiredness (lethargy).   °· Nausea or vomiting.   °· Confusion.   °· Problems with urination, such as:   °¨ Painful or burning feeling during urination.   °¨ Decreased urine production.   °¨ Frequent accidents in children who are potty trained.   °¨ Bloody urine.   °· Muscle twitches and cramps.   °· Shortness of breath.   °· Seizures.   °· Chest  pain or pressure. °Sometimes, no symptoms are present.  °DIAGNOSIS °Acute kidney injury may be detected and diagnosed by tests, including blood, urine, imaging, or kidney biopsy tests.  °TREATMENT °Treatment of acute kidney injury varies depending on the cause and severity of the kidney damage. In mild cases, no treatment may be needed. The kidneys may heal on their own. If acute kidney injury is more severe, your health care provider will treat the cause of the kidney damage, help the kidneys heal, and prevent complications from occurring. Severe cases may require a procedure to remove toxic wastes from the body (dialysis) or surgery to repair kidney damage. Surgery may involve:  °· Repair of a torn kidney.   °· Removal of an obstruction. °HOME CARE INSTRUCTIONS °· Follow your prescribed diet. °· Take medicines only as directed by your health care provider.  °· Do not take any new medicines (prescription, over-the-counter, or nutritional supplements) unless approved by your health care provider. Many medicines can worsen your kidney damage or may need to have the dose adjusted.   °· Keep all follow-up visits as directed by your health care provider. This is important. °· Observe your condition to make sure you are healing as expected. °SEEK IMMEDIATE MEDICAL CARE IF: °· You are feeling ill or have severe pain in the back or side.   °· Your symptoms return or you have new symptoms. °· You have any symptoms of end-stage kidney disease. These include:   °¨ Persistent itchiness.   °¨   Loss of appetite.   Headaches.   Abnormally dark or light skin.  Numbness in the hands or feet.   Easy bruising.   Frequent hiccups.   Menstruation stops.   You have a fever.  You have increased urine production.  You have pain or bleeding when urinating. MAKE SURE YOU:   Understand these instructions.  Will watch your condition.  Will get help right away if you are not doing well or get worse.   This  information is not intended to replace advice given to you by your health care provider. Make sure you discuss any questions you have with your health care provider.   Document Released: 06/11/2011 Document Revised: 12/17/2014 Document Reviewed: 07/25/2012 Elsevier Interactive Patient Education 2016 [employer]  Urinary Tract Infection Urinary tract infections (UTIs) can develop anywhere along your urinary tract. Your urinary tract is your body's drainage system for removing wastes and extra water. Your urinary tract includes two kidneys, two ureters, a bladder, and a urethra. Your kidneys are a pair of bean-shaped organs. Each kidney is about the size of your fist. They are located below your ribs, one on each side of your spine. CAUSES Infections are caused by microbes, which are microscopic organisms, including fungi, viruses, and bacteria. These organisms are so small that they can only be seen through a microscope. Bacteria are the microbes that most commonly cause UTIs. SYMPTOMS  Symptoms of UTIs may vary by age and gender of the patient and by the location of the infection. Symptoms in young women typically include a frequent and intense urge to urinate and a painful, burning feeling in the bladder or urethra during urination. Older women and men are more likely to be tired, shaky, and weak and have muscle aches and abdominal pain. A fever may mean the infection is in your kidneys. Other symptoms of a kidney infection include pain in your back or sides below the ribs, nausea, and vomiting. DIAGNOSIS To diagnose a UTI, your caregiver will ask you about your symptoms. Your caregiver will also ask you to provide a urine sample. The urine sample will be tested for bacteria and white blood cells. White blood cells are made by your body to help fight infection. TREATMENT  Typically, UTIs can be treated with medication. Because most UTIs are caused by a bacterial infection, they usually can be  treated with the use of antibiotics. The choice of antibiotic and length of treatment depend on your symptoms and the type of bacteria causing your infection. HOME CARE INSTRUCTIONS  If you were prescribed antibiotics, take them exactly as your caregiver instructs you. Finish the medication even if you feel better after you have only taken some of the medication.  Drink enough water and fluids to keep your urine clear or pale yellow.  Avoid caffeine, tea, and carbonated beverages. They tend to irritate your bladder.  Empty your bladder often. Avoid holding urine for long periods of time.  Empty your bladder before and after sexual intercourse.  After a bowel movement, women should cleanse from front to back. Use each tissue only once. SEEK MEDICAL CARE IF:   You have back pain.  You develop a fever.  Your symptoms do not begin to resolve within 3 days. SEEK IMMEDIATE MEDICAL CARE IF:   You have severe back pain or lower abdominal pain.  You develop chills.  You have nausea or vomiting.  You have continued burning or discomfort with urination. MAKE SURE YOU:   Understand these  instructions.  Will watch your condition.  Will get help right away if you are not doing well or get worse.   This information is not intended to replace advice given to you by your health care provider. Make sure you discuss any questions you have with your health care provider.   Document Released: 09/05/2005 Document Revised: 08/17/2015 Document Reviewed: 01/04/2012 Elsevier Interactive Patient Education [employer].

## 2016-08-02 NOTE — H&P (Signed)
Physical Medicine and Rehabilitation Admission H&P       Chief Complaint  Patient presents with  . Urinary Retention  : HPI: [name] a 82 y.o.right handed malewith history of hypertension, CAD status post CABG, BPH with kidney stones. Per chart review patient lives with spouse. Independent with a cane /walker prior to admission. One level home. Presented 07/29/2016 with recurrent pelvic pain 2 days. CT abdomen and pelvis showed moderate right sided mild left-sided hydronephrosis. No distal obstruction seen. Diffuse irregular bladder wall thickening and prominent surrounding soft inflammation compatible with cystitis. Follow-up renal ultrasound again showed mild hydronephrosis. Urine culture greater than 100,000 aerococcus. Currently maintained on amoxicillin. Subcutaneous heparin for DVT prophylaxis. Physical therapy evaluation completed 07/31/2016 with recommendations of physical medicine rehabilitation consult.Patient was admitted for comprehensive rehabilitation program  Left knee and RIght ankle pain, denies trauma or recent fall no hx gout ROS Constitutional: Negative for chillsand fever.  HENT: Negative for hearing loss.  Eyes: Negative for blurred visionand double vision.  Respiratory: Negative for cough.  Occasional shortness of breath with exertion Cardiovascular: Positive for leg swelling. Negative for chest painand palpitations.  Gastrointestinal: Positive for constipation. Negative for nauseaand vomiting.  Genitourinary: Positive for dysuriaand frequency. Negative for flank pain.  Musculoskeletal: Positive for myalgias.  Occasional falls Skin: Negative for rash.  Neurological: Positive for weakness. Negative for seizuresand headaches.  All other systems reviewed and are negative       Past Medical History:  Diagnosis Date  . CAD (coronary artery disease)   . Hypertension   . Kidney stones   . Prostate CA [facility])         Past  Surgical History:  Procedure Laterality Date  . CARDIAC SURGERY    . CORONARY ARTERY BYPASS GRAFT    . PENILE PROSTHESIS IMPLANT    . PROSTATE SURGERY     History reviewed. No pertinent family history. Social History:  reports that he has never smoked. He has never used smokeless tobacco. He reports that he uses drugs, including Marijuana. He reports that he does not drink alcohol. Allergies:       Allergies  Allergen Reactions  . Other Other (See Comments)    Antihistamines-unknown reaction         Medications Prior to Admission  Medication Sig Dispense Refill  . acetaminophen (TYLENOL) 500 MG tablet Take 500-1,000 mg by mouth every 6 (six) hours as needed for mild pain.     Marland Kitchen aspirin EC 81 MG tablet Take 81 mg by mouth every morning.    . metoprolol succinate (TOPROL-XL) 50 MG 24 hr tablet Take 50 mg by mouth daily.     . nitroGLYCERIN (NITRODUR - DOSED IN MG/24 HR) 0.4 mg/hr patch Place 0.4 mg onto the skin every morning.   3  . olmesartan-hydrochlorothiazide (BENICAR HCT) 40-25 MG tablet Take 1 tablet by mouth daily.  3  . Polyvinyl Alcohol-Povidone (REFRESH OP) Place 1-2 drops into both eyes daily as needed (dry eyes).    . rosuvastatin (CRESTOR) 10 MG tablet Take 10 mg by mouth every evening.     . traMADol (ULTRAM) 50 MG tablet Take 50 mg by mouth every 6 (six) hours as needed for moderate pain.       Home: Home Living Family/patient expects to be discharged to:: Private residence Living Arrangements: Spouse/significant other Available Help at Discharge: Family Type of Home: House Home Access: Level entry [place]: One [place]: Environmental consultant - 4 wheels, [place] - single point  Functional History: Prior Function Level of Independence: Independent Comments: Pt states wife is available if he needs assistance.  Functional Status:  Mobility: Bed Mobility Overal bed mobility: +2 for physical assistance, Needs Assistance Bed  Mobility: Rolling, Sidelying to Sit Rolling: Mod assist Sidelying to sit: Mod assist, +2 for physical assistance Sit to sidelying: +2 for physical assistance, Mod assist General bed mobility comments: assist to mobilize/stabilize L LE and elevate trunk into sitting with verbal and tactile cues for sequencing and use of rail; HOB elevated; rolled toward L side Transfers Overall transfer level: Needs assistance Equipment used: 2 person hand held assist (gait belt and bed pad) Transfers: Sit to/from Stand, Lateral/Scoot Transfers Sit to Stand: Max assist, +2 physical assistance, From elevated surface  Lateral/Scoot Transfers: Max assist, +2 physical assistance General transfer comment: sit to stand X1 from EOB with pt unable to achieve upright posture; performed lateral scoot with max +2 and use of bed pad EOB to recliner; cues for sequencing and technique Ambulation/Gait General Gait Details: not attempted due to pain    ADL:    Cognition: Cognition Overall Cognitive Status: Within Functional Limits for tasks assessed Cognition Arousal/Alertness: Awake/alert Behavior During Therapy: WFL for tasks assessed/performed Overall Cognitive Status: Within Functional Limits for tasks assessed  Physical Exam: Blood pressure 114/69, pulse 87, temperature 99.2 F (37.3 C), temperature source Oral, resp. rate 19, height _0  (1.905 m), weight 129.8 kg (286 lb 3.2 oz), SpO2 95 %. Physical Exam Constitutional: He appears well-developed.  82 year old right-handed male. Obese HENT:  Head: Normocephalicand atraumatic.  Eyes: Conjunctivaeand EOMare normal.  Neck: Normal range of motion. Neck supple. No thyromegalypresent.  Cardiovascular: Normal rateand regular rhythm.  Respiratory: Effort normaland breath sounds normal. No respiratory distress.  GI: Soft. He exhibits no distension.  Hyperactive BS Musculoskeletal:  LE edema tenderness, Left knee effusion with tenderness , no  erythema, Right ankle pain with ROM but no tenderness or erythema Neurological: He is alert.  Patient oriented to person, place and date of birth. Follows simple commands Motor: B/l UE:5/5 proximal to distal RLE: Hip flexion 3/5, knee extension 3/5, ankle dorsi/plantar flexion3/5 LLE: Hip flexion 2+/5, knee extension 2+/5, ankle dorsi/plantar flexion 2-/5 Pain inhibition in LE L>R Skin: Skin is warmand dry.  Venous stasis changes b/l LE, R>L Psychiatric: He has a normal mood and affect. His behavior is normal   Lab Results Last 48 Hours        Results for orders placed or performed during the hospital encounter of 07/28/16 (from the past 48 hour(s))  Basic metabolic panel     Status: Abnormal   Collection Time: 08/01/16  5:25 AM  Result Value Ref Range   Sodium 139 135 - 145 mmol/L   Potassium 4.1 3.5 - 5.1 mmol/L   Chloride 111 101 - 111 mmol/L   CO2 21 (L) 22 - 32 mmol/L   Glucose, Bld 105 (H) 65 - 99 mg/dL   BUN 21 (H) 6 - 20 mg/dL   Creatinine, Ser 1.22 0.61 - 1.24 mg/dL   Calcium 8.7 (L) 8.9 - 10.3 mg/dL   GFR calc non Af Amer 53 (L) >60 mL/min   GFR calc Af Amer >60 >60 mL/min    Comment: (NOTE) The eGFR has been calculated using the CKD EPI equation. This calculation has not been validated in all clinical situations. eGFR's persistently <60 mL/min signify possible Chronic Kidney Disease.   Anion gap 7 5 - 15  CBC     Status: Abnormal  Collection Time: 08/01/16  5:25 AM  Result Value Ref Range   WBC 10.5 4.0 - 10.5 K/uL   RBC 3.20 (L) 4.22 - 5.81 MIL/uL   Hemoglobin 9.1 (L) 13.0 - 17.0 g/dL   HCT 28.5 (L) 39.0 - 52.0 %   MCV 89.1 78.0 - 100.0 fL   MCH 28.4 26.0 - 34.0 pg   MCHC 31.9 30.0 - 36.0 g/dL   RDW 15.4 11.5 - 15.5 %   Platelets 182 150 - 400 K/uL     Imaging Results (Last 48 hours)  No results found.       Medical Problem List and Plan: 1.  Debilitation secondary to cystitis/hydronephrosis 2.  DVT  Prophylaxis/Anticoagulation: Subcutaneous heparin. Monitor platelet counts and any signs of bleeding 3. Pain Management: Ultram as needed 4. UTI/Aerococcus. Amoxicillin 08/01/2016 5. Neuropsych: This patient is capable of making decisions on his own behalf. 6. Skin/Wound Care: Routine skin checks 7. Fluids/Electrolytes/Nutrition: Routine I&O's with follow-up chemistries 8. CAD status post CABG. Discuss resuming aspirin with [name] 9. Acute on chronic anemia. Follow-up CBC 10. Hyperlipidemia. Resume Crestor 11.  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 Functional deficits secondary  to debilitation due to medical issues with arthralgias. 2. Patient is admitted to receive collaborative, interdisciplinary care between the physiatrist, rehab nursing staff, and therapy team. 3. Patient's level of medical complexity and substantial therapy needs in context of that medical necessity cannot be provided at a lesser intensity of care such as a SNF. 4. Patient has experienced substantial functional loss from his/her baseline which was documented above under the "Functional History" and "Functional Status" headings.  Judging by the patient's diagnosis, physical exam, and functional history, the patient has potential for functional progress which will result in measurable gains while on inpatient rehab.  These gains will be of substantial and practical use upon discharge  in facilitating mobility and self-care at the household level. 5. Physiatrist will provide 24 hour management of medical needs as well as oversight of the therapy plan/treatment and provide guidance as appropriate regarding the interaction of the two. 6. 24 hour rehab nursing will assist with bladder management, bowel management, safety, skin/wound care, disease management, medication administration, pain  management and patient education  and help integrate therapy concepts, techniques,education, etc. 7. PT will assess and treat for/with: pre gait, gait training, endurance , safety, equipment, neuromuscular re education.   Goals are: Sup. 8. OT will assess and treat for/with: ADLs, Cognitive perceptual skills, Neuromuscular re education, safety, endurance, equipment.   Goals are: Sup. Therapy may proceed with showering this patient. 9. SLP will assess and treat for/with: NA.  Goals are: NA. 10. Case Management and Social Worker will assess and treat for psychological issues and discharge planning. 11. Team conference will be held weekly to assess progress toward goals and to determine barriers to discharge. 12. Patient will receive at least 3 hours of therapy per day at least 5 days per week. 13. ELOS: 14-19d       14. Prognosis:  good     Charlett Blake [name] [facility] FAAPM&R (Sports Med, Neuromuscular Med) Diplomate Am Board of Electrodiagnostic Med  08/02/2016

## 2016-08-02 NOTE — Care Management Note (Signed)
Case Management Note  Patient Details  Name: [name] MRN: [id number] Date of Birth: Sep 18, 1931  Subjective/Objective:          Admitted with UTI/cystitis,  history of CAD, hypertension, kidney stone and prostate cancer. Lives with wife, [name] [id number]. PTA independent with ADL's. Walked with a cane PTA.  PCP: [name]       Action/Plan: Plan is to d/c to CIR today.  Expected Discharge Date:  08/02/2016             Expected Discharge Plan:  [place] (resides with wife, Gomer)  In-House Referral:     Discharge planning Services  CM Consult Status of Service:  completed  If discussed at [employer] of Stay Meetings, dates discussed:    Additional Comments:  Sharin Mons, RN 08/02/2016, 11:47 AM

## 2016-08-02 NOTE — Progress Notes (Signed)
Kevin [name], MD Physician Signed Physical Medicine and Rehabilitation  Consult Note Date of Service: 08/01/2016 2:27 PM  Related encounter: ED to Hosp-Admission (Current) from 07/28/2016 in [place] All Collapse All   [] Hide copied text [] Hover for attribution information      Physical Medicine and Rehabilitation Consult Reason for Consult: Debilitation related to cystitis/UTI Referring Physician: [name]   HPI: [name] is a 82 y.o. right handed male with history of hypertension, CAD status post CABG, BPH with kidney stones. Per chart review patient lives with spouse. Independent with a cane /walker prior to admission. One level home. Presented 07/29/2016 with recurrent pelvic pain 2 days. CT abdomen and pelvis showed moderate right sided mild left-sided hydronephrosis. No distal obstruction seen. Diffuse irregular bladder wall thickening and prominent surrounding soft inflammation compatible with cystitis. Follow-up renal ultrasound again showed mild hydronephrosis. Urine culture greater than 100,000 aerococcus. Currently maintained on amoxicillin. Subcutaneous heparin for DVT prophylaxis. Physical therapy evaluation completed 07/31/2016 with recommendations of physical medicine rehabilitation consult.   Review of Systems  Constitutional: Negative for chills and fever.  HENT: Negative for hearing loss.   Eyes: Negative for blurred vision and double vision.  Respiratory: Negative for cough.        Occasional shortness of breath with exertion  Cardiovascular: Positive for leg swelling. Negative for chest pain and palpitations.  Gastrointestinal: Positive for constipation. Negative for nausea and vomiting.  Genitourinary: Positive for dysuria and frequency. Negative for flank pain.  Musculoskeletal: Positive for myalgias.       Occasional falls  Skin: Negative for rash.  Neurological: Positive for weakness. Negative for  seizures and headaches.  All other systems reviewed and are negative.      Past Medical History:  Diagnosis Date  . CAD (coronary artery disease)   . Hypertension   . Kidney stones   . Prostate CA [facility])         Past Surgical History:  Procedure Laterality Date  . CARDIAC SURGERY    . CORONARY ARTERY BYPASS GRAFT    . PENILE PROSTHESIS IMPLANT    . PROSTATE SURGERY     History reviewed. No pertinent family history. Social History:  reports that he has never smoked. He has never used smokeless tobacco. He reports that he uses drugs, including Marijuana. He reports that he does not drink alcohol. Allergies:       Allergies  Allergen Reactions  . Other Other (See Comments)    Antihistamines-unknown reaction         Medications Prior to Admission  Medication Sig Dispense Refill  . acetaminophen (TYLENOL) 500 MG tablet Take 500-1,000 mg by mouth every 6 (six) hours as needed for mild pain.     Marland Kitchen aspirin EC 81 MG tablet Take 81 mg by mouth every morning.    . metoprolol succinate (TOPROL-XL) 50 MG 24 hr tablet Take 50 mg by mouth daily.     . nitroGLYCERIN (NITRODUR - DOSED IN MG/24 HR) 0.4 mg/hr patch Place 0.4 mg onto the skin every morning.   3  . olmesartan-hydrochlorothiazide (BENICAR HCT) 40-25 MG tablet Take 1 tablet by mouth daily.  3  . Polyvinyl Alcohol-Povidone (REFRESH OP) Place 1-2 drops into both eyes daily as needed (dry eyes).    . rosuvastatin (CRESTOR) 10 MG tablet Take 10 mg by mouth every evening.     . traMADol (ULTRAM) 50 MG tablet Take 50 mg by mouth  every 6 (six) hours as needed for moderate pain.       Home: Home Living Family/patient expects to be discharged to:: Private residence Living Arrangements: Spouse/significant other Available Help at Discharge: Family Type of Home: House Home Access: Level entry [place]: One [place]: Environmental consultant - 4 wheels, [place] - single point  Functional History: Prior  Function Level of Independence: Independent Comments: Pt states wife is available if he needs assistance. Functional Status:  Mobility: Bed Mobility Overal bed mobility: +2 for physical assistance, Needs Assistance Bed Mobility: Rolling, Sidelying to Sit Rolling: Mod assist Sidelying to sit: Mod assist, +2 for physical assistance Sit to sidelying: +2 for physical assistance, Mod assist General bed mobility comments: assist to mobilize/stabilize L LE and elevate trunk into sitting with verbal and tactile cues for sequencing and use of rail; HOB elevated; rolled toward L side Transfers Overall transfer level: Needs assistance Equipment used: 2 person hand held assist (gait belt and bed pad) Transfers: Sit to/from Stand, Lateral/Scoot Transfers Sit to Stand: Max assist, +2 physical assistance, From elevated surface  Lateral/Scoot Transfers: Max assist, +2 physical assistance General transfer comment: sit to stand X1 from EOB with pt unable to achieve upright posture; performed lateral scoot with max +2 and use of bed pad EOB to recliner; cues for sequencing and technique Ambulation/Gait General Gait Details: not attempted due to pain    ADL:    Cognition: Cognition Overall Cognitive Status: Within Functional Limits for tasks assessed Cognition Arousal/Alertness: Awake/alert Behavior During Therapy: WFL for tasks assessed/performed Overall Cognitive Status: Within Functional Limits for tasks assessed  Blood pressure 115/66, pulse 65, temperature 99.1 F (37.3 C), temperature source Oral, resp. rate 16, height 6\' 3"  (1.905 m), weight 131.5 kg (290 lb), SpO2 99 %. Physical Exam  Vitals reviewed. Constitutional: He appears well-developed.  82 year old right-handed male. Obese  HENT:  Head: Normocephalic and atraumatic.  Eyes: Conjunctivae and EOM are normal.  Neck: Normal range of motion. Neck supple. No thyromegaly present.  Cardiovascular: Normal rate and regular rhythm.     Respiratory: Effort normal and breath sounds normal. No respiratory distress.  GI: Soft. He exhibits no distension.  Hyperactive BS  Musculoskeletal:  LE edema tenderness  Neurological: He is alert.  Patient oriented to person, place and date of birth. Follows simple commands Motor: B/l UE: 4/5 proximal to distal RLE: Hip flexion 3/5, knee extension 2+/5, ankle dorsi/plantar flexion 2/5 LLE: Hip flexion 2+/5, knee extension 2+/5, ankle dorsi/plantar flexion 2-/5  Skin: Skin is warm and dry.  Venous stasis changes b/l LE, R>L  Psychiatric: He has a normal mood and affect. His behavior is normal.    Lab Results Last 24 Hours       Results for orders placed or performed during the hospital encounter of 07/28/16 (from the past 24 hour(s))  Basic metabolic panel     Status: Abnormal   Collection Time: 08/01/16  5:25 AM  Result Value Ref Range   Sodium 139 135 - 145 mmol/L   Potassium 4.1 3.5 - 5.1 mmol/L   Chloride 111 101 - 111 mmol/L   CO2 21 (L) 22 - 32 mmol/L   Glucose, Bld 105 (H) 65 - 99 mg/dL   BUN 21 (H) 6 - 20 mg/dL   Creatinine, Ser 1.22 0.61 - 1.24 mg/dL   Calcium 8.7 (L) 8.9 - 10.3 mg/dL   GFR calc non Af Amer 53 (L) >60 mL/min   GFR calc Af Amer >60 >60 mL/min  Anion gap 7 5 - 15  CBC     Status: Abnormal   Collection Time: 08/01/16  5:25 AM  Result Value Ref Range   WBC 10.5 4.0 - 10.5 K/uL   RBC 3.20 (L) 4.22 - 5.81 MIL/uL   Hemoglobin 9.1 (L) 13.0 - 17.0 g/dL   HCT 28.5 (L) 39.0 - 52.0 %   MCV 89.1 78.0 - 100.0 fL   MCH 28.4 26.0 - 34.0 pg   MCHC 31.9 30.0 - 36.0 g/dL   RDW 15.4 11.5 - 15.5 %   Platelets 182 150 - 400 K/uL      Imaging Results (Last 48 hours)  US Renal  Result Date: 07/30/2016 CLINICAL DATA:  Hydronephrosis EXAM: RENAL / URINARY TRACT ULTRASOUND COMPLETE COMPARISON:  CT abdomen dated 07/28/2016. FINDINGS: Right Kidney: Length: 9.5 cm. Cortical echogenicity is within normal limits. Areas of probable cortical  scarring. No solid or cystic mass identified. Mild hydronephrosis, less prominent than the hydronephrosis seen on recent CT. Shadowing 8 mm stone within right renal pelvis, compatible with the stone seen on recent CT. Left Kidney: Length: 10.6 cm. Echogenicity within normal limits. No mass or hydronephrosis visualized. Bladder: Bladder decompressed by Foley catheter. IMPRESSION: 1. Mild right-sided hydronephrosis, decreased compared to the hydronephrosis seen on recent CT of 07/28/2016. 2. 8 mm right renal stone. 3. Left kidney is unremarkable without hydronephrosis. Mild hydronephrosis seen on earlier CT has resolved. 4. Bladder decompressed by Foley catheter. Electronically Signed   By: Franki Cabot [name]   On: 07/30/2016 16:51     Assessment/Plan: Diagnosis: Debilitation  Labs and images independently reviewed.  Records reviewed and summated above.  1. Does the need for close, 24 hr/day medical supervision in concert with the patient's rehab needs make it unreasonable for this patient to be served in a less intensive setting? Yes 2. Co-Morbidities requiring supervision/potential complications: cystitis/UTI (cont abx), HTN (monitor and provide prns in accordance with increased physical exertion and pain), CAD (cont meds), BPH (cont meds), kidney stones (monitor pain), CKD (avoid nephrotoxic meds), Anemia (transfuse if necessary to ensure appropriate perfusion for increased activity tolerance), morbid obesity (Body mass index is 36.25 kg/m., diet and exercise education, encourage weight loss to increase endurance and promote overall health) 3. Due to bladder management, safety, skin/wound care, disease management, pain management and patient education, does the patient require 24 hr/day rehab nursing? Yes 4. Does the patient require coordinated care of a physician, rehab nurse, PT (1-2 hrs/day, 5 days/week) and OT (1-2 hrs/day, 5 days/week) to address physical and functional deficits in the context of  the above medical diagnosis(es)? Yes Addressing deficits in the following areas: balance, endurance, locomotion, strength, transferring, bowel/bladder control, bathing, dressing, toileting and psychosocial support 5. Can the patient actively participate in an intensive therapy program of at least 3 hrs of therapy per day at least 5 days per week? Yes 6. The potential for patient to make measurable gains while on inpatient rehab is excellent 7. Anticipated functional outcomes upon discharge from inpatient rehab are mod assist and max assist  with PT, min assist and mod assist with OT, n/a with SLP. 8. Estimated rehab length of stay to reach the above functional goals is: 19-22 days. 9. Does the patient have adequate social supports and living environment to accommodate these discharge functional goals? Potentially 10. Anticipated D/C setting: Home 11. Anticipated post D/C treatments: HH therapy and Home excercise program 12. Overall Rehab/Functional Prognosis: good  RECOMMENDATIONS: This patient's condition is appropriate for continued  rehabilitative care in the following setting: Pt require max assist for therapies.  If caregiver support available at discharge, would recommend CIR, however, his wife alone, will unlikely be able to care for pt independently after a short IRF stay.  Patient has agreed to participate in recommended program. Yes Note that insurance prior authorization may be required for reimbursement for recommended care.  Comment: Rehab Admissions Coordinator to follow up.  Delice Lesch, MD 08/01/2016    Revision History                        Routing History

## 2016-08-02 NOTE — PMR Pre-admission (Signed)
PMR Admission Coordinator Pre-Admission Assessment  Patient: [name] is an 82 y.o., male MRN: [id number] DOB: 08-11-1931 Height: 6\' 3"  (190.5 cm) Weight: 129.8 kg (286 lb 3.2 oz)              Insurance Information HMO:     PPO:      PCP:      IPA:      80/20: yes     OTHER: no HMO PRIMARY: Medicare a and b      Policy#: [id number]      Subscriber: pt Benefits:  Phone #: passport one online     Name: 08/02/16 Eff. Date: 06/09/90     Deduct: $1316     Out of Pocket Max: none      Life Max: none CIR: 100%      SNF: 20 full days Outpatient: 80%     Co-Pay: 20% Home Health: 100%      Co-Pay: none DME: 80%     Co-Pay: 20% Providers: pt choice  SECONDARY: G[employer] [place]      Policy#: [id number]      Subscriber: pt  Medicaid Application Date:       Case Manager:  Disability Application Date:       Case Worker:   Emergency Dickinson    Name Relation Home Work Mobile   Piney Spouse   [phone]     Current Medical History  Patient Admitting Diagnosis: debilitation  History of Present Illness:Kevin L Spenceris a 82 y.o.right handed malewith history of hypertension, CAD status post CABG, BPH with kidney stones. Presented 07/29/2016 with recurrent pelvic pain 2 days. CT abdomen and pelvis showed moderate right sided mild left-sided hydronephrosis. No distal obstruction seen. Diffuse irregular bladder wall thickening and prominent surrounding soft inflammation compatible with cystitis. Follow-up renal ultrasound again showed mild hydronephrosis. Urine culture greater than 100,000 aerococcus. Currently maintained on amoxicillin. Subcutaneous heparin for DVT prophylaxis.  Past Medical History  Past Medical History:  Diagnosis Date  . CAD (coronary artery disease)   . Hypertension   . Kidney stones   . Prostate CA [facility])     Family History  family history is not on file.  Prior Rehab/Hospitalizations:  Has the patient had major surgery  during 100 days prior to admission? No  Current Medications   Current Facility-Administered Medications:  .  acetaminophen (TYLENOL) tablet 650 mg, 650 mg, Oral, Q6H PRN, [name], MD .  amoxicillin (AMOXIL) capsule 500 mg, 500 mg, Oral, Q8H, [name], MD, 500 mg at 08/02/16 0645 .  feeding supplement (NEPRO CARB STEADY) liquid 237 mL, 237 mL, Oral, BID BM, [name], MD, 237 mL at 08/01/16 1400 .  heparin injection 5,000 Units, 5,000 Units, Subcutaneous, Q8H, Dixie Dials, MD, 5,000 Units at 08/02/16 0645 .  ondansetron (ZOFRAN) injection 4 mg, 4 mg, Intravenous, Once, Deno Etienne, DO .  opium-belladonna (B&O SUPPRETTES) 16.2-60 MG suppository 1 suppository, 1 suppository, Rectal, Q6H PRN, Estill Bamberg Dancy, PA-C .  sodium chloride flush (NS) 0.9 % injection 3 mL, 3 mL, Intravenous, Q12H, Dixie Dials, MD, 3 mL at 08/01/16 2214 .  traMADol (ULTRAM) tablet 50 mg, 50 mg, Oral, Q8H PRN, [name], MD, 50 mg at 08/02/16 0808  Patients Current Diet: Diet renal with fluid restriction Fluid restriction: 1800 mL Fluid; Room service appropriate? Yes; Fluid consistency: Thin  Precautions / Restrictions Precautions Precautions: Fall Restrictions Weight Bearing Restrictions: No   Has the patient had 2 or more falls or  a fall with injury in the past year?No  Prior Activity Level Limited Community (1-2x/wk): pt does not drive and limited community access since 1/17 due to bladder incontinence and frequency  Pt dx with bladder Ca 1/17. Has been limited community due to urinary urgency, frequency and incontinence. Wears depends. Was Mod I with cane and rollator in the home. Pt has seat in his tub with hand held shower. His wife asissted with bathing lower body and dressing since 1/17. Pt was Mod I with cane and rollarot in the home.  Home Assistive Devices / Equipment Home Assistive Devices/Equipment: Grab bars around toilet, Hand-held shower hose, Grab bars in shower, Shower chair with  back, Radio producer (specify quad or straight), Wheelchair (Straight cane.) Home Equipment: Environmental consultant - 4 wheels, Cane - single point  Prior Device Use: Indicate devices/aids used by the patient prior to current illness, exacerbation or injury? Walker and cane  Prior Functional Level Prior Function Level of Independence: Independent with assistive device(s) (used cand and rollator as needed) Comments: wife and adults son, Elberta Fortis can assist in the home Pt with hisotry of bladder Cancer diagnosed 1/17. After Self Care: Did the patient need help bathing, dressing, using the toilet or eating?  Needed some help  Indoor Mobility: Did the patient need assistance with walking from room to room (with or without device)? Independent  Stairs: Did the patient need assistance with internal or external stairs (with or without device)? Needed some help  Functional Cognition: Did the patient need help planning regular tasks such as shopping or remembering to take medications? Needed some help  Current Functional Level Cognition  Overall Cognitive Status: Within Functional Limits for tasks assessed    Extremity Assessment (includes Sensation/Coordination)  Upper Extremity Assessment: Generalized weakness  Lower Extremity Assessment: Generalized weakness, RLE deficits/detail, LLE deficits/detail RLE Deficits / Details: Pt able to perform knee flexion while in bed. LLE Deficits / Details: Pt unable to lift LE off bed due to pain.    ADLs       Mobility  Overal bed mobility: +2 for physical assistance, Needs Assistance Bed Mobility: Rolling, Sidelying to Sit Rolling: Mod assist Sidelying to sit: Mod assist, +2 for physical assistance Sit to sidelying: +2 for physical assistance, Mod assist General bed mobility comments: assist to mobilize/stabilize L LE and elevate trunk into sitting with verbal and tactile cues for sequencing and use of rail; HOB elevated; rolled toward L side    Transfers  Overall  transfer level: Needs assistance Equipment used: 2 person hand held assist (gait belt and bed pad) Transfers: Sit to/from Stand, Lateral/Scoot Transfers Sit to Stand: Max assist, +2 physical assistance, From elevated surface  Lateral/Scoot Transfers: Max assist, +2 physical assistance General transfer comment: sit to stand X1 from EOB with pt unable to achieve upright posture; performed lateral scoot with max +2 and use of bed pad EOB to recliner; cues for sequencing and technique    Ambulation / Gait / Stairs / Wheelchair Mobility  Ambulation/Gait General Gait Details: not attempted due to pain    Posture / Balance Balance Overall balance assessment: Needs assistance Sitting-balance support: No upper extremity supported, Feet supported Sitting balance-Leahy Scale: Fair Standing balance support: Bilateral upper extremity supported Standing balance-Leahy Scale: Zero    Special needs/care consideration Skin dry flaky skin                           Bowel mgmt: continent LBM 07/31/16 Bladder mgmt: foley  placed this admission and urology recommends to d/c home with foley. Pt has had urgency, frequency and incontinence since bladder CA diagnosis 12/2015 Diabetic mgmt no   Previous Home Environment Living Arrangements: Spouse/significant other (adult son, Elberta Fortis lives in the home and adult son, Randall Hiss is )  Lives With: Spouse, Family Available Help at Discharge: Family, Available 24 hours/day Type of Home: House Home Layout: One level Home Access: Stairs to enter Entrance Stairs-Rails: None Entrance Stairs-Number of Steps: 1 step Bathroom Shower/Tub: Tub/shower unit, Industrial/product designer: Yes How Accessible: Accessible via walker Reliance: No Additional Comments: [facility] already [facility] with [facility] on making accomodations to his bathroom  Discharge Living Setting Plans for Discharge Living Setting: Patient's home, Lives with (comment) (wife and  adult son, Elberta Fortis) Type of Home at Discharge: House Discharge Home Layout: One level Discharge Home Access: Stairs to enter Entrance Stairs-Rails: None Entrance Stairs-Number of Steps: 1 small step Discharge Bathroom Shower/Tub: Tub/shower unit, Curtain Discharge Bathroom Toilet: Standard Discharge Bathroom Accessibility: Yes How Accessible: Accessible via walker Does the patient have any problems obtaining your medications?: No  Social/Family/Support Systems Patient Roles: Spouse, Parent Contact Information: Danton Clap, wife Anticipated Caregiver: wife, sons Elberta Fortis and Randall Hiss to assist Anticipated Caregiver's Contact Information: see above. no landline, just cell Ability/Limitations of Caregiver: wife 63 years old but very active and independent Caregiver Availability: 24/7 Discharge Plan Discussed with Primary Caregiver: Yes Is Caregiver In Agreement with Plan?: Yes Does Caregiver/Family have Issues with Lodging/Transportation while Pt is in Rehab?: No   Patient lives with his wife. Son , Randall Hiss has lived with them for about 6 years but plans to move out to be with his girlfriend this weekend. He has just completed a drug rehab program, has a job and the plan has been for him to go live with his GF. Son, Elberta Fortis, has been living with pt for one month. He has CHF and lost his job. He is physically able to assist. Wife is 72 years old and very active. Provides care to 80 yo and 80 yo great grand children in the home, but they return to school 8/28. Wife states Elberta Fortis and Randall Hiss can assist in the care of pt as she requests. [facility] is assisting to begin renovations in the future to pt's bathroom once funding provided.  Goals/Additional Needs Patient/Family Goal for Rehab: Min to Mod assist with PT and OT Expected length of stay: ELOS 19-22 days Additional Information: Pt HOH and has hearing aids . Also wears glasses Pt/Family Agrees to Admission and willing to participate: Yes Program Orientation  Provided & Reviewed with Pt/Caregiver Including Roles  & Responsibilities: Yes  Decrease burden of Care through IP rehab admission: n/a  Possible need for SNF placement upon discharge:not anticipated, but I explained to pt and wife at bedside 8/24 pta that if pt does not reach level to d/c home or sons unable to assist, that further rehab could be recommended at a SNF.  Patient Condition: This patient's medical and functional status has changed since the consult dated _8/23/2017 in which the Rehabilitation Physician determined and documented that the patient was potentially appropriate for intensive rehabilitative care in an inpatient rehabilitation facility. Issues have been addressed and update has been discussed with Dr. Letta Pate  and patient now appropriate for inpatient rehabilitation. Wife has confirmed that pt's two adult sons can assist in providing physical care needed in the home as needed. Will admit to inpatient rehab today.    Preadmission Screen  Completed By:  Cleatrice Burke, 08/02/2016 12:40 PM ______________________________________________________________________   Discussed status with Dr. Letta Pate on 08/02/2016 at 1236 and received telephone approval for admission today.  Admission Coordinator:  Cleatrice Burke, time [id number] Date 08/02/2016

## 2016-08-02 NOTE — Discharge Summary (Signed)
Discharge summary dictated on 08/02/2016 dictation number is [phone]

## 2016-08-02 NOTE — Clinical Social Work Note (Signed)
Clinical Social Work Assessment  Patient Details  Name: [name] MRN: [id number] Date of Birth: [birth year]  Date of referral:  08/02/16               Reason for consult:  Facility Placement                Permission sought to share information with:  Family Supports, Chartered certified accountant granted to share information::  Yes, Verbal Permission Granted  Name::     Associate Professor::  SNFs  Relationship::  Spouse  Contact Information:     Housing/Transportation Living arrangements for the past 2 months:  Single Family Home Source of Information:  Patient Patient Interpreter Needed:  None Criminal Activity/Legal Involvement Pertinent to Current Situation/Hospitalization:  No - Comment as needed Significant Relationships:  Spouse Lives with:  Spouse Do you feel safe going back to the place where you live?  No Need for family participation in patient care:  Yes (Comment)  Care giving concerns:  CSW received referral for possible SNF placement at time of discharge if CIR is unable to admit. CSW met with patient at bedside regarding PT recommendation of SNF placement at time of discharge. Patient states that he would really prefer CIR, but patient's wife is currently unable to care for patient at their home given patient's current physical needs and fall risk. Patient expressed understanding of PT recommendation and is agreeable to SNF placement at time of discharge. CSW to continue to follow and assist with discharge planning needs.   Social Worker assessment / plan:  CSW spoke with patient concerning possibility of rehab at [facility] before returning home.  Employment status:  Retired Forensic scientist:  [employer] PT Recommendations:  [place], [place] / Referral to community resources:  [place]  Patient/Family's Response to care:  Patient recognizes need for rehab before returning home and are agreeable  to a SNF in [facility] if CIR cannot admit.  Patient/Family's Understanding of and Emotional Response to Diagnosis, Current Treatment, and Prognosis:  Patient/family is realistic regarding therapy needs and expressed being hopeful for CIR placement. No questions/concerns about plan or treatment.    Emotional Assessment Appearance:  Appears stated age Attitude/Demeanor/Rapport:  Other (Appropriate) Affect (typically observed):  Accepting, Appropriate Orientation:  Oriented to Self, Oriented to Situation, Oriented to Place, Oriented to  Time Alcohol / Substance use:  Not Applicable Psych involvement (Current and /or in the community):  No (Comment)  Discharge Needs  Concerns to be addressed:  Care Coordination Readmission within the last 30 days:  No Current discharge risk:  None Barriers to Discharge:  Continued Medical Work up   [employer], [place] 08/02/2016, 8:48 AM

## 2016-08-03 ENCOUNTER — Encounter (HOSPITAL_COMMUNITY): Payer: Self-pay | Admitting: *Deleted

## 2016-08-03 ENCOUNTER — Inpatient Hospital Stay (HOSPITAL_COMMUNITY): Payer: Medicare Other | Admitting: Occupational Therapy

## 2016-08-03 ENCOUNTER — Inpatient Hospital Stay (HOSPITAL_COMMUNITY): Payer: Medicare Other | Admitting: Physical Therapy

## 2016-08-03 DIAGNOSIS — D649 Anemia, unspecified: Secondary | ICD-10-CM

## 2016-08-03 DIAGNOSIS — M109 Gout, unspecified: Secondary | ICD-10-CM

## 2016-08-03 DIAGNOSIS — R5381 Other malaise: Principal | ICD-10-CM

## 2016-08-03 DIAGNOSIS — R339 Retention of urine, unspecified: Secondary | ICD-10-CM

## 2016-08-03 DIAGNOSIS — M10062 Idiopathic gout, left knee: Secondary | ICD-10-CM

## 2016-08-03 DIAGNOSIS — N3001 Acute cystitis with hematuria: Secondary | ICD-10-CM

## 2016-08-03 DIAGNOSIS — D62 Acute posthemorrhagic anemia: Secondary | ICD-10-CM

## 2016-08-03 DIAGNOSIS — I158 Other secondary hypertension: Secondary | ICD-10-CM

## 2016-08-03 DIAGNOSIS — D638 Anemia in other chronic diseases classified elsewhere: Secondary | ICD-10-CM

## 2016-08-03 DIAGNOSIS — N3000 Acute cystitis without hematuria: Secondary | ICD-10-CM

## 2016-08-03 LAB — CBC WITH DIFFERENTIAL/PLATELET
BASOS ABS: 0.1 10*3/uL (ref 0.0–0.1)
Basophils Relative: 1 %
EOS ABS: 0.3 10*3/uL (ref 0.0–0.7)
EOS PCT: 3 %
HCT: 30.8 % — ABNORMAL LOW (ref 39.0–52.0)
Hemoglobin: 10.1 g/dL — ABNORMAL LOW (ref 13.0–17.0)
LYMPHS ABS: 2.2 10*3/uL (ref 0.7–4.0)
Lymphocytes Relative: 21 %
MCH: 28.5 pg (ref 26.0–34.0)
MCHC: 32.8 g/dL (ref 30.0–36.0)
MCV: 87 fL (ref 78.0–100.0)
MONO ABS: 1.2 10*3/uL — AB (ref 0.1–1.0)
Monocytes Relative: 11 %
Neutro Abs: 6.9 10*3/uL (ref 1.7–7.7)
Neutrophils Relative %: 64 %
PLATELETS: 230 10*3/uL (ref 150–400)
RBC: 3.54 MIL/uL — AB (ref 4.22–5.81)
RDW: 14.9 % (ref 11.5–15.5)
WBC: 10.7 10*3/uL — AB (ref 4.0–10.5)

## 2016-08-03 LAB — COMPREHENSIVE METABOLIC PANEL
ALT: 45 U/L (ref 17–63)
AST: 49 U/L — AB (ref 15–41)
Albumin: 2.2 g/dL — ABNORMAL LOW (ref 3.5–5.0)
Alkaline Phosphatase: 49 U/L (ref 38–126)
Anion gap: 10 (ref 5–15)
BUN: 13 mg/dL (ref 6–20)
CALCIUM: 9 mg/dL (ref 8.9–10.3)
CO2: 24 mmol/L (ref 22–32)
CREATININE: 0.99 mg/dL (ref 0.61–1.24)
Chloride: 101 mmol/L (ref 101–111)
GFR calc Af Amer: >60 mL/min (ref >60)
Glucose, Bld: 109 mg/dL — ABNORMAL HIGH (ref 65–99)
POTASSIUM: 3.8 mmol/L (ref 3.5–5.1)
SODIUM: 135 mmol/L (ref 135–145)
TOTAL PROTEIN: 6.4 g/dL — AB (ref 6.5–8.1)
Total Bilirubin: 0.6 mg/dL (ref 0.3–1.2)

## 2016-08-03 MED ORDER — METHYLPREDNISOLONE 4 MG PO TBPK
8.0000 mg | ORAL_TABLET | Freq: Every evening | ORAL | Status: AC
  Administered 2016-08-04: 8 mg via ORAL

## 2016-08-03 MED ORDER — METHYLPREDNISOLONE 4 MG PO TBPK
4.0000 mg | ORAL_TABLET | Freq: Four times a day (QID) | ORAL | Status: AC
  Administered 2016-08-05 – 2016-08-08 (×10): 4 mg via ORAL

## 2016-08-03 MED ORDER — METHYLPREDNISOLONE 4 MG PO TBPK
8.0000 mg | ORAL_TABLET | Freq: Every morning | ORAL | Status: AC
Start: 2016-08-03 — End: 2016-08-03
  Administered 2016-08-03: 8 mg via ORAL
  Filled 2016-08-03: qty 21

## 2016-08-03 MED ORDER — METHYLPREDNISOLONE 4 MG PO TBPK
4.0000 mg | ORAL_TABLET | ORAL | Status: AC
Start: 2016-08-03 — End: 2016-08-03
  Administered 2016-08-03: 4 mg via ORAL

## 2016-08-03 MED ORDER — METHYLPREDNISOLONE 4 MG PO TBPK
4.0000 mg | ORAL_TABLET | Freq: Three times a day (TID) | ORAL | Status: AC
  Administered 2016-08-04 (×3): 4 mg via ORAL

## 2016-08-03 MED ORDER — ASPIRIN EC 81 MG PO TBEC: 81.0000 mg | DELAYED_RELEASE_TABLET | Freq: Every day | ORAL | Status: DC

## 2016-08-03 MED ORDER — METHYLPREDNISOLONE 4 MG PO TBPK
4.0000 mg | ORAL_TABLET | ORAL | Status: AC
  Administered 2016-08-03: 4 mg via ORAL

## 2016-08-03 MED ORDER — TRAMADOL HCL 50 MG PO TABS
50.0000 mg | ORAL_TABLET | Freq: Four times a day (QID) | ORAL | Status: DC | PRN
  Administered 2016-08-03 – 2016-08-11 (×14): 50 mg via ORAL
  Filled 2016-08-03 (×14): qty 1

## 2016-08-03 MED ORDER — METHYLPREDNISOLONE 4 MG PO TBPK
8.0000 mg | ORAL_TABLET | Freq: Every evening | ORAL | Status: AC
  Administered 2016-08-03: 8 mg via ORAL

## 2016-08-03 NOTE — H&P (View-Only) (Signed)
Physical Medicine and Rehabilitation Admission H&P       Chief Complaint  Patient presents with  . Urinary Retention  : HPI: [name] a 82 y.o.right handed malewith history of hypertension, CAD status post CABG, BPH with kidney stones. Per chart review patient lives with spouse. Independent with a cane /walker prior to admission. One level home. Presented 07/29/2016 with recurrent pelvic pain 2 days. CT abdomen and pelvis showed moderate right sided mild left-sided hydronephrosis. No distal obstruction seen. Diffuse irregular bladder wall thickening and prominent surrounding soft inflammation compatible with cystitis. Follow-up renal ultrasound again showed mild hydronephrosis. Urine culture greater than 100,000 aerococcus. Currently maintained on amoxicillin. Subcutaneous heparin for DVT prophylaxis. Physical therapy evaluation completed 07/31/2016 with recommendations of physical medicine rehabilitation consult.Patient was admitted for comprehensive rehabilitation program  Left knee and RIght ankle pain, denies trauma or recent fall no hx gout ROS Constitutional: Negative for chillsand fever.  HENT: Negative for hearing loss.  Eyes: Negative for blurred visionand double vision.  Respiratory: Negative for cough.  Occasional shortness of breath with exertion Cardiovascular: Positive for leg swelling. Negative for chest painand palpitations.  Gastrointestinal: Positive for constipation. Negative for nauseaand vomiting.  Genitourinary: Positive for dysuriaand frequency. Negative for flank pain.  Musculoskeletal: Positive for myalgias.  Occasional falls Skin: Negative for rash.  Neurological: Positive for weakness. Negative for seizuresand headaches.  All other systems reviewed and are negative       Past Medical History:  Diagnosis Date  . CAD (coronary artery disease)   . Hypertension   . Kidney stones   . Prostate CA [facility])         Past  Surgical History:  Procedure Laterality Date  . CARDIAC SURGERY    . CORONARY ARTERY BYPASS GRAFT    . PENILE PROSTHESIS IMPLANT    . PROSTATE SURGERY     History reviewed. No pertinent family history. Social History:  reports that he has never smoked. He has never used smokeless tobacco. He reports that he uses drugs, including Marijuana. He reports that he does not drink alcohol. Allergies:       Allergies  Allergen Reactions  . Other Other (See Comments)    Antihistamines-unknown reaction         Medications Prior to Admission  Medication Sig Dispense Refill  . acetaminophen (TYLENOL) 500 MG tablet Take 500-1,000 mg by mouth every 6 (six) hours as needed for mild pain.     Marland Kitchen aspirin EC 81 MG tablet Take 81 mg by mouth every morning.    . metoprolol succinate (TOPROL-XL) 50 MG 24 hr tablet Take 50 mg by mouth daily.     . nitroGLYCERIN (NITRODUR - DOSED IN MG/24 HR) 0.4 mg/hr patch Place 0.4 mg onto the skin every morning.   3  . olmesartan-hydrochlorothiazide (BENICAR HCT) 40-25 MG tablet Take 1 tablet by mouth daily.  3  . Polyvinyl Alcohol-Povidone (REFRESH OP) Place 1-2 drops into both eyes daily as needed (dry eyes).    . rosuvastatin (CRESTOR) 10 MG tablet Take 10 mg by mouth every evening.     . traMADol (ULTRAM) 50 MG tablet Take 50 mg by mouth every 6 (six) hours as needed for moderate pain.       Home: Home Living Family/patient expects to be discharged to:: Private residence Living Arrangements: Spouse/significant other Available Help at Discharge: Family Type of Home: House Home Access: Level entry [place]: One [place]: Environmental consultant - 4 wheels, [place] - single point  Functional History: Prior Function Level of Independence: Independent Comments: Pt states wife is available if he needs assistance.  Functional Status:  Mobility: Bed Mobility Overal bed mobility: +2 for physical assistance, Needs Assistance Bed  Mobility: Rolling, Sidelying to Sit Rolling: Mod assist Sidelying to sit: Mod assist, +2 for physical assistance Sit to sidelying: +2 for physical assistance, Mod assist General bed mobility comments: assist to mobilize/stabilize L LE and elevate trunk into sitting with verbal and tactile cues for sequencing and use of rail; HOB elevated; rolled toward L side Transfers Overall transfer level: Needs assistance Equipment used: 2 person hand held assist (gait belt and bed pad) Transfers: Sit to/from Stand, Lateral/Scoot Transfers Sit to Stand: Max assist, +2 physical assistance, From elevated surface  Lateral/Scoot Transfers: Max assist, +2 physical assistance General transfer comment: sit to stand X1 from EOB with pt unable to achieve upright posture; performed lateral scoot with max +2 and use of bed pad EOB to recliner; cues for sequencing and technique Ambulation/Gait General Gait Details: not attempted due to pain    ADL:    Cognition: Cognition Overall Cognitive Status: Within Functional Limits for tasks assessed Cognition Arousal/Alertness: Awake/alert Behavior During Therapy: WFL for tasks assessed/performed Overall Cognitive Status: Within Functional Limits for tasks assessed  Physical Exam: Blood pressure 114/69, pulse 87, temperature 99.2 F (37.3 C), temperature source Oral, resp. rate 19, height _0  (1.905 m), weight 129.8 kg (286 lb 3.2 oz), SpO2 95 %. Physical Exam Constitutional: He appears well-developed.  82 year old right-handed male. Obese HENT:  Head: Normocephalicand atraumatic.  Eyes: Conjunctivaeand EOMare normal.  Neck: Normal range of motion. Neck supple. No thyromegalypresent.  Cardiovascular: Normal rateand regular rhythm.  Respiratory: Effort normaland breath sounds normal. No respiratory distress.  GI: Soft. He exhibits no distension.  Hyperactive BS Musculoskeletal:  LE edema tenderness, Left knee effusion with tenderness , no  erythema, Right ankle pain with ROM but no tenderness or erythema Neurological: He is alert.  Patient oriented to person, place and date of birth. Follows simple commands Motor: B/l UE:5/5 proximal to distal RLE: Hip flexion 3/5, knee extension 3/5, ankle dorsi/plantar flexion3/5 LLE: Hip flexion 2+/5, knee extension 2+/5, ankle dorsi/plantar flexion 2-/5 Pain inhibition in LE L>R Skin: Skin is warmand dry.  Venous stasis changes b/l LE, R>L Psychiatric: He has a normal mood and affect. His behavior is normal   Lab Results Last 48 Hours        Results for orders placed or performed during the hospital encounter of 07/28/16 (from the past 48 hour(s))  Basic metabolic panel     Status: Abnormal   Collection Time: 08/01/16  5:25 AM  Result Value Ref Range   Sodium 139 135 - 145 mmol/L   Potassium 4.1 3.5 - 5.1 mmol/L   Chloride 111 101 - 111 mmol/L   CO2 21 (L) 22 - 32 mmol/L   Glucose, Bld 105 (H) 65 - 99 mg/dL   BUN 21 (H) 6 - 20 mg/dL   Creatinine, Ser 1.22 0.61 - 1.24 mg/dL   Calcium 8.7 (L) 8.9 - 10.3 mg/dL   GFR calc non Af Amer 53 (L) >60 mL/min   GFR calc Af Amer >60 >60 mL/min    Comment: (NOTE) The eGFR has been calculated using the CKD EPI equation. This calculation has not been validated in all clinical situations. eGFR's persistently <60 mL/min signify possible Chronic Kidney Disease.   Anion gap 7 5 - 15  CBC     Status: Abnormal  Collection Time: 08/01/16  5:25 AM  Result Value Ref Range   WBC 10.5 4.0 - 10.5 K/uL   RBC 3.20 (L) 4.22 - 5.81 MIL/uL   Hemoglobin 9.1 (L) 13.0 - 17.0 g/dL   HCT 28.5 (L) 39.0 - 52.0 %   MCV 89.1 78.0 - 100.0 fL   MCH 28.4 26.0 - 34.0 pg   MCHC 31.9 30.0 - 36.0 g/dL   RDW 15.4 11.5 - 15.5 %   Platelets 182 150 - 400 K/uL     Imaging Results (Last 48 hours)  No results found.       Medical Problem List and Plan: 1.  Debilitation secondary to cystitis/hydronephrosis 2.  DVT  Prophylaxis/Anticoagulation: Subcutaneous heparin. Monitor platelet counts and any signs of bleeding 3. Pain Management: Ultram as needed 4. UTI/Aerococcus. Amoxicillin 08/01/2016 5. Neuropsych: This patient is capable of making decisions on his own behalf. 6. Skin/Wound Care: Routine skin checks 7. Fluids/Electrolytes/Nutrition: Routine I&O's with follow-up chemistries 8. CAD status post CABG. Discuss resuming aspirin with [name] 9. Acute on chronic anemia. Follow-up CBC 10. Hyperlipidemia. Resume Crestor 11.  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 Functional deficits secondary  to debilitation due to medical issues with arthralgias. 2. Patient is admitted to receive collaborative, interdisciplinary care between the physiatrist, rehab nursing staff, and therapy team. 3. Patient's level of medical complexity and substantial therapy needs in context of that medical necessity cannot be provided at a lesser intensity of care such as a SNF. 4. Patient has experienced substantial functional loss from his/her baseline which was documented above under the "Functional History" and "Functional Status" headings.  Judging by the patient's diagnosis, physical exam, and functional history, the patient has potential for functional progress which will result in measurable gains while on inpatient rehab.  These gains will be of substantial and practical use upon discharge  in facilitating mobility and self-care at the household level. 5. Physiatrist will provide 24 hour management of medical needs as well as oversight of the therapy plan/treatment and provide guidance as appropriate regarding the interaction of the two. 6. 24 hour rehab nursing will assist with bladder management, bowel management, safety, skin/wound care, disease management, medication administration, pain  management and patient education  and help integrate therapy concepts, techniques,education, etc. 7. PT will assess and treat for/with: pre gait, gait training, endurance , safety, equipment, neuromuscular re education.   Goals are: Sup. 8. OT will assess and treat for/with: ADLs, Cognitive perceptual skills, Neuromuscular re education, safety, endurance, equipment.   Goals are: Sup. Therapy may proceed with showering this patient. 9. SLP will assess and treat for/with: NA.  Goals are: NA. 10. Case Management and Social Worker will assess and treat for psychological issues and discharge planning. 11. Team conference will be held weekly to assess progress toward goals and to determine barriers to discharge. 12. Patient will receive at least 3 hours of therapy per day at least 5 days per week. 13. ELOS: 14-19d       14. Prognosis:  good     Charlett Blake [name] [facility] FAAPM&R (Sports Med, Neuromuscular Med) Diplomate Am Board of Electrodiagnostic Med  08/02/2016

## 2016-08-03 NOTE — IPOC Note (Signed)
Overall Plan of Care [facility]) Patient Details Name: [name] MRN: [id number] DOB: 12/01/1931  Admitting Diagnosis: [facility] [facility] Problems: Active Problems:   Debilitated   Acute cystitis with hematuria   Acute blood loss anemia   Anemia of chronic disease   Other secondary hypertension   Acute idiopathic gout of left knee   Urinary retention     Functional Problem List: Nursing Bladder, Safety, Nutrition, Medication Management, Skin Integrity  PT Balance, Endurance, Pain  OT Balance, Endurance, Motor, Pain  SLP    TR         Basic ADL's: OT Bathing, Dressing, Toileting     Advanced  ADL's: OT       Transfers: PT Bed Mobility, Bed to Chair, Car, Furniture, Floor  OT Toilet, Tub/Shower     Locomotion: PT Ambulation, Stairs, Emergency planning/management officer     Additional Impairments: OT None  SLP        TR      Anticipated Outcomes Item Anticipated Outcome  Self Feeding I  Swallowing      Basic self-care  min A with LB self care  Toileting  min A   Bathroom Transfers min A  Bowel/Bladder  Foley care every shift  Transfers  min A with LRAD.   Locomotion  Gait with Min A with LRAD for household distance and mod I with WC  Communication     Cognition     Pain  maintain pain level of 3 or less  Safety/Judgment  Transfer without injuries   Therapy Plan: PT Intensity: Minimum of 1-2 x/day ,45 to 90 minutes PT Frequency: 5 out of 7 days PT Duration Estimated Length of Stay: 3-4 weeks.  OT Intensity: Minimum of 1-2 x/day, 45 to 90 minutes OT Frequency: 5 out of 7 days OT Duration/Estimated Length of Stay: 24-28 days         Team Interventions: Nursing Interventions Bladder Management, Pain Management, Medication Management, Psychosocial Support, Patient/Family Education, Skin Care/Wound Management  PT interventions Ambulation/gait training, Training and development officer, Cognitive remediation/compensation, Community reintegration, Discharge  planning, Disease management/prevention, DME/adaptive equipment instruction, Functional mobility training, Neuromuscular re-education, Pain management, Patient/family education, Psychosocial support, Skin care/wound management, Splinting/orthotics, Stair training, Therapeutic Activities, Therapeutic Exercise, UE/LE Strength taining/ROM, UE/LE Coordination activities, Wheelchair propulsion/positioning  OT Interventions Training and development officer, DME/adaptive equipment instruction, Discharge planning, Functional mobility training, Patient/family education, Self Care/advanced ADL retraining, Pain management, Therapeutic Activities, Therapeutic Exercise, UE/LE Strength taining/ROM  SLP Interventions    TR Interventions    SW/CM Interventions Discharge Planning, Psychosocial Support, Patient/Family Education    Team Discharge Planning: Destination: PT-Home ,OT- Home , SLP-  Projected Follow-up: PT-Home health PT, OT-  Home health OT, SLP-  Projected Equipment Needs: PT-Wheelchair (measurements), Wheelchair cushion (measurements), To be determined, OT- Tub/shower bench, SLP-  Equipment Details: PT- , OT-  Patient/family involved in discharge planning: PT- Patient,  OT-Patient, SLP-   MD ELOS: 16-19 days. Medical Rehab Prognosis:  Good Assessment: 82 y.o.right handed malewith history of hypertension, CAD status post CABG, BPH with kidney stones. Independent with a cane /walker prior to admission. One level home. Presented 07/29/2016 with recurrent pelvic pain 2 days. CT abdomen and pelvis showed moderate right sided mild left-sided hydronephrosis. No distal obstruction seen. Diffuse irregular bladder wall thickening and prominent surrounding soft inflammation compatible with cystitis. Follow-up renal ultrasound again showed mild hydronephrosis. Urine culture greater than 100,000 aerococcus. Currently maintained on amoxicillin. Pt with resulting deficits in endurance and profound LE weakness, L>R  exacerbated by gout.  Will set goals for min A with therapies.   See Team Conference Notes for weekly updates to the plan of care

## 2016-08-03 NOTE — Care Management Note (Signed)
[facility] Individual Statement of Services  Patient Name:  [name]  Date:  08/03/2016  Welcome to the [facility].  Our goal is to provide you with an individualized program based on your diagnosis and situation, designed to meet your specific needs.  With this comprehensive rehabilitation program, you will be expected to participate in at least 3 hours of rehabilitation therapies Monday-Friday, with modified therapy programming on the weekends.  Your rehabilitation program will include the following services:  Physical Therapy (PT), Occupational Therapy (OT), 24 hour per day rehabilitation nursing, Case Management (Social Worker), Rehabilitation Medicine, Nutrition Services and Pharmacy Services  Weekly team conferences will be held on Wednesday to discuss your progress.  Your Social Worker will talk with you frequently to get your input and to update you on team discussions.  Team conferences with you and your family in attendance may also be held.  Expected length of stay: 24-26 days  Overall anticipated outcome: min assist level  Depending on your progress and recovery, your program may change. Your Social Worker will coordinate services and will keep you informed of any changes. Your Social Worker's name and contact numbers are listed  below.  The following services may also be recommended but are not provided by the [facility] will be made to provide these services after discharge if needed.  Arrangements include referral to agencies that provide these services.  Your insurance has been verified to be:  [place] Your primary doctor is:  [name]  Pertinent information will be shared with your doctor and your insurance company.  Social Worker:  Ovidio Kin, [place] or ([phone]  Information discussed with and copy given to patient by: [name], 08/03/2016, 12:56 PM

## 2016-08-03 NOTE — Progress Notes (Addendum)
[place] PHYSICAL MEDICINE & REHABILITATION     PROGRESS NOTE  Subjective/Complaints:  Pt sleeping in bed this AM.  He is easily arousable.  He complains of an achy pain in his LE, states it is because it is cold on the room.  ROS: +LE pain.  Denies CP, SOB, N/V/D.  Objective: Vital Signs: Blood pressure (!) 149/81, pulse 75, temperature 99.2 F (37.3 C), temperature source Oral, resp. rate 18, weight 127.9 kg (282 lb), SpO2 96 %. Dg Knee 1-2 Views Left  Result Date: 08/02/2016 CLINICAL DATA:  Left knee pain EXAM: LEFT KNEE - 1-2 VIEW COMPARISON:  None. FINDINGS: No fracture or dislocation is seen. Moderate tricompartmental degenerative changes, most prominent in the patellofemoral compartment. Moderate suprapatellar knee joint effusion. IMPRESSION: No fracture or dislocation is seen. Moderate degenerative changes. Moderate suprapatellar knee joint effusion. Electronically Signed   By: Julian Hy [name]   On: 08/02/2016 17:18   Dg Ankle 2 Views Right  Result Date: 08/02/2016 CLINICAL DATA:  Right ankle pain EXAM: RIGHT ANKLE - 2 VIEW COMPARISON:  None. FINDINGS: No fracture or dislocation is seen. The ankle mortise is intact. Degenerative changes along the tibiotalar joint. Mild spurring along the medial malleolus, likely posttraumatic. Small posterior and plantar calcaneal enthesophytes. Mild degenerative changes of the dorsal midfoot. Mild lateral soft tissue swelling. IMPRESSION: No fracture or dislocation is seen. Degenerative changes of the tibiotalar joint, as above, possibly posttraumatic. Electronically Signed   By: Julian Hy [name]   On: 08/02/2016 17:22    Recent Labs  08/02/16 2242 08/03/16 0641  WBC 10.0 10.7*  HGB 9.8* 10.1*  HCT 30.1* 30.8*  PLT 231 230    Recent Labs  08/01/16 0525 08/02/16 2242 08/03/16 0641  NA 139  --  135  K 4.1  --  3.8  CL 111  --  101  GLUCOSE 105*  --  109*  BUN 21*  --  13  CREATININE 1.22 1.16 0.99  CALCIUM 8.7*  --  9.0    CBG (last 3)  No results for input(s): GLUCAP in the last 72 hours.  Wt Readings from Last 3 Encounters:  08/03/16 127.9 kg (282 lb)  08/02/16 129.8 kg (286 lb 3.2 oz)  09/16/15 (!) 140.6 kg (310 lb)    Physical Exam:  BP (!) 149/81 (BP Location: Left Arm)   Pulse 75   Temp 99.2 F (37.3 C) (Oral)   Resp 18   Wt 127.9 kg (282 lb)   SpO2 96%   BMI 35.25 kg/m  Constitutional: He appears well-developed. Obese. Vital signs reviewed.  HENT: Normocephalicand atraumatic.  Eyes: Conjunctivaeand EOMare normal.  Cardiovascular: Normal rateand regular rhythm.  Respiratory: Effort normaland breath sounds normal. No respiratory distress.  GI: Soft. He exhibits no distension. Hyperactive BS Musculoskeletal:  LE edema tenderness Left knee effusion with tenderness , no erythema Right ankle pain with ROM but no tenderness or erythema Neurological: He is alert.  Follows simple commands Motor: B/l UE:5/5 proximal to distal RLE: Hip flexion 3+/5, knee extension 4-/5, ankle dorsi/plantar flexion 4-/5 LLE: Hip flexion 2+/5, knee extension 2+/5, ankle dorsi/plantar flexion 3-/5 Skin: Skin is warmand dry.  Venous stasis changes b/l LE, R>L Psychiatric: He has a normal mood and affect. His behavior is normal   Assessment/Plan: 1. Functional deficits secondary to cystitis/hydronephrosis which require 3+ hours per day of interdisciplinary therapy in a comprehensive inpatient rehab setting. Physiatrist is providing close team supervision and 24 hour management of active medical problems listed  below. Physiatrist and rehab team continue to assess barriers to discharge/monitor patient progress toward functional and medical goals.  Function:  Bathing Bathing position      Bathing parts      Bathing assist        Upper Body Dressing/Undressing Upper body dressing   What is the patient wearing?: Hospital gown                Upper body assist        Lower Body  Dressing/Undressing Lower body dressing   What is the patient wearing?: Hospital Gown, Non-skid slipper socks           Non-skid slipper socks- Performed by helper: Don/doff right sock, Don/doff left sock                  Lower body assist Assist for lower body dressing: 2 Helpers      Toileting Toileting          Toileting assist     Transfers Chair/bed transfer             Locomotion Ambulation Ambulation activity did not occur: N/A         Wheelchair Wheelchair activity did not occur: N/A (New pt, will be evaluated by PT in AM)        Cognition Comprehension    Expression    Social Interaction    Problem Solving    Memory       Medical Problem List and Plan: 1. Debilitationsecondary to cystitis/hydronephrosis  Begin CIR 2. DVT Prophylaxis/Anticoagulation: Subcutaneous heparin. Monitor platelet counts and any signs of bleeding 3. Pain Management: Ultram as needed 4. UTI/Aerococcus. Amoxicillin, will confirm length of therapy 5. Neuropsych: This patient iscapable of making decisions on hisown behalf. 6. Skin/Wound Care: Routine skin checks 7. Fluids/Electrolytes/Nutrition: Routine I&O's  BMP WNL on 8/25 8.CAD status post CABG. Discuss resumingaspirinwith [name] 9.Acute on chronic anemia.   Hb 10.1 on 8/25  Will cont to monitor 10.Hyperlipidemia. Resume Crestor 11.  Left knee effusion as well as right ankle joint pain   Uric acid WNL 8/24  ESR elevated 8/24  Xrays reviewed, showing degenerative changes in ankle and effusion in knee.  Will start medrol dosepack 8/25 12. HTN  Will monitor with increased activity 13. Urinary retention  Foley placed by Urology on 8/21  Will plan to d/c early week of week 8/28 and consider voiding trial  LOS (Days) 1 A FACE TO FACE EVALUATION WAS PERFORMED  Jasira [name] [name] 08/03/2016 8:18 AM

## 2016-08-03 NOTE — Evaluation (Signed)
Physical Therapy Assessment and Plan  Patient Details  Name: [name] MRN: [id number] Date of Birth: [birth year]  PT Diagnosis: Abnormality of gait, Difficulty walking, Muscle weakness, Pain in joint and Pain in Bilateral distal LE Rehab Potential: Good ELOS: 2.5-3 weeks.    Today's Date: 08/03/2016 PT Individual Time: 0800-0915 PT Individual Time Calculation (min): 75 min     Problem List:  Patient Active Problem List   Diagnosis Date Noted  . Acute cystitis with hematuria   . Acute blood loss anemia   . Anemia of chronic disease   . Other secondary hypertension   . Acute idiopathic gout of left knee   . Urinary retention   . Debilitated 08/02/2016  . Bilateral hydronephrosis   . Normochromic normocytic anemia   . E-coli UTI   . Benign essential HTN   . Coronary artery disease involving coronary bypass graft of native heart without angina pectoris   . BPH (benign prostatic hyperplasia)   . Kidney stones   . CKD (chronic kidney disease)   . Morbid obesity due to excess calories ([place])   . Acute renal failure (ARF) ([place]) 07/29/2016    Past Medical History:  Past Medical History:  Diagnosis Date  . CAD (coronary artery disease)   . Hypertension   . Kidney stones   . [facility]    Past Surgical History:  Past Surgical History:  Procedure Laterality Date  . CARDIAC SURGERY    . CORONARY ARTERY BYPASS GRAFT    . PENILE PROSTHESIS IMPLANT    . PROSTATE SURGERY      Assessment & Plan Clinical Impression: Patient is an 82 y.o.right handed malewith history of hypertension, CAD status post CABG, BPH with kidney stones. Per chart review patient lives with spouse. Independent with a cane /walker prior to admission. One level home. Presented 07/29/2016 with recurrent pelvic pain 2 days. CT abdomen and pelvis showed moderate right sided mild left-sided hydronephrosis. No distal obstruction seen. Diffuse irregular bladder wall thickening and prominent surrounding  soft inflammation compatible with cystitis. Follow-up renal ultrasound again showed mild hydronephrosis. Urine culture greater than 100,000 aerococcus. Currently maintained on amoxicillin. Subcutaneous heparin for DVT prophylaxis. Patient transferred to CIR on 08/02/2016 .   Patient currently requires max with mobility secondary to muscle weakness, decreased cardiorespiratoy endurance and decreased standing balance and decreased balance strategies.  Prior to hospitalization, patient was modified independent  with mobility and lived with Spouse, Family in a   home.  Home access is 1 stepStairs to enter.  Patient will benefit from skilled PT intervention to maximize safe functional mobility, minimize fall risk and decrease caregiver burden for planned discharge home with 24 hour assist.  Anticipate patient will benefit from follow up [facility] at discharge.  PT - End of Session Activity Tolerance: Tolerates 10 - 20 min activity with multiple rests Endurance Deficit: Yes PT Assessment Rehab Potential (ACUTE/IP ONLY): Good Barriers to Discharge: Inaccessible home environment PT Patient demonstrates impairments in the following area(s): Balance;Endurance;Pain PT Transfers Functional Problem(s): Bed Mobility;Bed to Chair;Car;Furniture;Floor PT Locomotion Functional Problem(s): Ambulation;Stairs;Wheelchair Mobility PT Plan PT Intensity: Minimum of 1-2 x/day ,45 to 90 minutes PT Frequency: 5 out of 7 days PT Duration Estimated Length of Stay: 3-4 weeks.  PT Treatment/Interventions: Ambulation/gait training;Balance/vestibular training;Cognitive remediation/compensation;Community reintegration;Discharge planning;Disease management/prevention;DME/adaptive equipment instruction;Functional mobility training;Neuromuscular re-education;Pain management;Patient/family education;Psychosocial support;Skin care/wound management;Splinting/orthotics;Stair training;Therapeutic Activities;Therapeutic Exercise;UE/LE Strength  taining/ROM;UE/LE Coordination activities;Wheelchair propulsion/positioning PT Transfers Anticipated Outcome(s): min A with LRAD.  PT Locomotion Anticipated Outcome(s): Gait with  Min A with LRAD for household distance and mod I with WC PT Recommendation Follow Up Recommendations: Home health PT Patient destination: Home Equipment Recommended: Wheelchair (measurements);Wheelchair cushion (measurements);To be determined  Skilled Therapeutic Intervention  Patient received supine in Bed and agreeable to PT.  Patient instructed in bed mobility as listed below. PT also instructed patient in scoot to EOB and scooting at EOB with max A and max cues for reciprocal movement.  Sit<>stand with max-total A from PT at stedy after 3 tries as listed below. Patient unable to perform sit<>stand from [facility] height once in [place]. PT instructed patient in SB transfer with max A and chuck under bottom to reduce sheer forces. Patient also instructed in [facility] mobility as listed below. After 13f patient reports increased dizziness with BP 119/77, HR 86.   [facility] returned to room and [facility].      PT Evaluation Precautions/Restrictions Precautions Precautions: Fall Restrictions Weight Bearing Restrictions: No General Chart Reviewed: Yes Additional Pertinent History: CAD, HT, [place] stones, Prostate CA Family/Caregiver Present: No Vital Signs Pain Pain Assessment Pain Assessment: 0-10 Pain Score: 7  Faces Pain Scale: Hurts little more Pain Type: Acute pain Pain Location: Leg Pain Orientation: Left;Distal Pain Descriptors / Indicators: Sharp Pain Frequency: Constant Pain Onset: On-going Patients Stated Pain Goal: 4 Pain Intervention(s): Medication (See eMAR) Home Living/Prior Functioning Home Living Available Help at Discharge: Family;Available 24 hours/day Home Access: Stairs to enter Entrance Stairs-Number of Steps: 1 step Entrance Stairs-Rails: None Bathroom  Shower/Tub: Tub/shower unit;Curtain Bathroom Toilet: Standard Bathroom Accessibility: Yes Additional Comments: [facility] on making accomodations to his bathroom  Lives With: Spouse;Family Prior Function Level of Independence: Requires assistive device for independence  Able to Take Stairs?: No Driving: Yes Vocation: Retired Comments: wife and adults son, AElberta [place] assist in the home Vision/Perception     Cognition Overall Cognitive Status: Within Functional Limits for tasks assessed Orientation Level: Oriented X4 Memory: Appears intact Awareness: Appears intact Problem Solving: Appears intact Safety/Judgment: Appears intact Sensation Sensation Light Touch: Appears Intact Proprioception: Appears Intact Additional Comments: heightened light touch appreciation due to pain in the LLE distal to the knee Coordination Gross Motor Movements are Fluid and Coordinated: No Fine Motor Movements are Fluid and Coordinated: Yes Coordination and Movement Description: pain limits coordination in the LLE.  Motor  Motor Motor: Other (comment) Motor - Skilled Clinical Observations: Gerneralizde weakness secondary to pain.   Mobility Bed Mobility Bed Mobility: Rolling Right;Rolling Left;Supine to Sit;Sit to Supine Rolling Right: 2: Max assist Rolling Right Details: Verbal cues for technique;Verbal cues for precautions/safety;Manual facilitation for placement;Tactile cues for placement Rolling Left: 3: Mod assist Rolling Left Details: Verbal cues for technique;Manual facilitation for weight shifting;Manual facilitation for placement;Tactile cues for placement Supine to Sit: 2: Max assist Supine to Sit Details: Manual facilitation for weight shifting;Manual facilitation for placement;Verbal cues for technique;Verbal cues for precautions/safety;Tactile cues for placement Sit to Supine: 2: Max assist Transfers Transfers: Yes Sit to Stand: 2: Max [place]in [place] Sit to  Stand Details: Verbal cues for technique;Verbal cues for precautions/safety;Manual facilitation for weight shifting;Manual facilitation for placement Lateral/Scoot Transfers: 2: Max assist (with chuck. ) Lateral/Scoot Transfer Details: Tactile cues for placement;Visual cues for safe use of DME/AE;Verbal cues for technique;Verbal cues for precautions/safety;Verbal cues for safe use of DME/AE;Manual facilitation for placement Locomotion  Ambulation Ambulation: No Gait Gait: No Stairs / Additional Locomotion Stairs: No Wheelchair Mobility Wheelchair Mobility: Yes Wheelchair Assistance:  4: Min Technical sales engineer Details: Tactile cues for placement;Verbal cues for precautions/safety;Verbal cues for safe use of DME/AE Wheelchair Propulsion: Both upper extremities Wheelchair Parts Management: Needs assistance Distance: 156f  Trunk/Postural Assessment  Cervical Assessment Cervical Assessment: Within Functional Limits Thoracic Assessment Thoracic Assessment: Within Functional Limits Lumbar Assessment Lumbar Assessment: Exceptions to [facility](posterior pelvic tilt. ) Postural Control Postural Control: Within Functional Limits  Balance Balance Balance Assessed: Yes Static Sitting Balance Static Sitting - Balance Support: No upper extremity supported Static Sitting - Level of Assistance: 6: Modified independent (Device/Increase time) Dynamic Sitting Balance Dynamic Sitting - Balance Support: Right upper extremity supported Dynamic Sitting - Level of Assistance: 5: Stand by assistance Static Standing Balance Static Standing - Level of Assistance: 2: Max assist Static Standing - Comment/# of Minutes: 10 seconds in stedy Extremity Assessment      RLE Assessment RLE Assessment: Exceptions to [facility] SurgeryRLE AROM (degrees) RLE Overall AROM Comments: Lacking 40% hip flexion secondary to body habitus.  RLE Strength RLE Overall Strength Comments: grossly 4-/5 proximal to distal  LLE  Assessment LLE Assessment: Exceptions to WFL LLE AROM (degrees) LLE Overall AROM Comments: lacking 50% full knee extension, unable to achieve 90degrees hip flexion  LLE Strength LLE Overall Strength: Deficits LLE Overall Strength Comments: 2+/5 distal to proximal except kne flexion 3/5.    See Function Navigator for Current Functional Status.   Refer to Care Plan for Long Term Goals  Recommendations for other services: None  Discharge Criteria: Patient will be discharged from PT if patient refuses treatment 3 consecutive times without medical reason, if treatment goals not met, if there is a change in medical status, if patient makes no progress towards goals or if patient is discharged from hospital.  The above assessment, treatment plan, treatment alternatives and goals were discussed and mutually agreed upon: by patient  ALorie Phenix8/25/2017, 10:07 AM

## 2016-08-03 NOTE — Progress Notes (Signed)
Patient information reviewed and entered into eRehab system by Kashmere Staffa, RN, CRRN, PPS Coordinator.  Information including medical coding and functional independence measure will be reviewed and updated through discharge.     Per nursing patient was given "Data Collection Information Summary for Patients in Inpatient Rehabilitation Facilities with attached "Privacy Act Statement-Health Care Records" upon admission.  

## 2016-08-03 NOTE — Evaluation (Signed)
Occupational Therapy Assessment and Plan  Patient Details  Name: [name] MRN: [id number] Date of Birth: October 30, 1931  OT Diagnosis: acute pain and muscle weakness (generalized) Rehab Potential: Rehab Potential (ACUTE ONLY): Good ELOS: 24-28 days   Today's Date: 08/03/2016 OT Individual Time: 4709-6283 OT Individual Time Calculation (min): 75 min      Problem List: Patient Active Problem List   Diagnosis Date Noted  . Acute cystitis with hematuria   . Acute blood loss anemia   . Anemia of chronic disease   . Other secondary hypertension   . Acute idiopathic gout of left knee   . Urinary retention   . Debilitated 08/02/2016  . Bilateral hydronephrosis   . Normochromic normocytic anemia   . E-coli UTI   . Benign essential HTN   . Coronary artery disease involving coronary bypass graft of native heart without angina pectoris   . BPH (benign prostatic hyperplasia)   . Kidney stones   . CKD (chronic kidney disease)   . Morbid obesity due to excess calories ([place])   . Acute renal failure (ARF) ([place]) 07/29/2016    Past Medical History:  Past Medical History:  Diagnosis Date  . CAD (coronary artery disease)   . Hypertension   . Kidney stones   . Prostate CA [facility])    Past Surgical History:  Past Surgical History:  Procedure Laterality Date  . CARDIAC SURGERY    . CORONARY ARTERY BYPASS GRAFT    . PENILE PROSTHESIS IMPLANT    . PROSTATE SURGERY      Assessment & Plan Clinical Impression:  [name] is a 82 y.o. right handed male with history of hypertension, CAD status post CABG, BPH with kidney stones. Per chart review patient lives with spouse. Independent with a cane /walker prior to admission. One level home. Presented 07/29/2016 with recurrent pelvic pain 2 days. CT abdomen and pelvis showed moderate right sided mild left-sided hydronephrosis. No distal obstruction seen. Diffuse irregular bladder wall thickening and prominent surrounding soft inflammation  compatible with cystitis. Follow-up renal ultrasound again showed mild hydronephrosis. Urine culture greater than 100,000 aerococcus. Currently maintained on amoxicillin. Subcutaneous heparin for DVT prophylaxis. Physical therapy evaluation completed 07/31/2016 with recommendations of physical medicine rehabilitation consult.Patient was admitted for comprehensive rehabilitation program Patient transferred to CIR on 08/02/2016 .    Patient currently requires total with basic self-care skills secondary to muscle weakness, decreased cardiorespiratoy endurance and decreased standing balance and surgical/ joint pain.  Prior to hospitalization, patient was fully independent.   Patient will benefit from skilled intervention to increase independence with basic self-care skills prior to discharge home with care partner.  Anticipate patient will require minimal physical assistance and follow up home health.  OT - End of Session Activity Tolerance: Tolerates < 10 min activity, no significant change in vital signs Endurance Deficit: Yes OT Assessment Rehab Potential (ACUTE ONLY): Good OT Patient demonstrates impairments in the following area(s): Balance;Endurance;Motor;Pain OT Basic ADL's Functional Problem(s): Bathing;Dressing;Toileting OT Transfers Functional Problem(s): Toilet;Tub/Shower OT Additional Impairment(s): None OT Plan OT Intensity: Minimum of 1-2 x/day, 45 to 90 minutes OT Frequency: 5 out of 7 days OT Duration/Estimated Length of Stay: 24-28 days OT Treatment/Interventions: Teacher, English as a foreign language;Discharge planning;Functional mobility training;Patient/family education;Self Care/advanced ADL retraining;Pain management;Therapeutic Activities;Therapeutic Exercise;UE/LE Strength taining/ROM OT Self Feeding Anticipated Outcome(s): I OT Basic Self-Care Anticipated Outcome(s): min A with LB self care OT Toileting Anticipated Outcome(s): min A OT Bathroom  Transfers Anticipated Outcome(s): min A OT Recommendation Patient destination: Home  Follow Up Recommendations: Home health OT Equipment Recommended: Tub/shower bench   Skilled Therapeutic Intervention Pt seen for initial evaluation and ADL retraining with a focus on activity tolerance and tolerating small amounts of movement in legs. Pt very hypersensitive to touch and can only tolerate moving leg small amount. Attempted sit to stand from w/c with bariatric stedy with +3 and pt was not able to tolerate any wt on leg and could not wt shift forward. Pt transferred to bed with slide board with mod A +2 to complete self care as pt can not perform from w/c level without standing. Pt needed a great deal of extra time to tolerate rolling in bed to cleanse bottom, don brief.  Used hospital gown today as pt only had a pair of jeans with him.  Discussed OT POC, goals.  Pt resting in bed with all needs met.  OT Evaluation Precautions/Restrictions  Precautions Precautions: Fall Precaution Comments: L knee/leg pain    Pain Pain Assessment Pain Assessment: 0-10 Pain Score: 10-Worst pain ever Pain Type: Acute pain Pain Location: Leg Pain Orientation: Left Pain Descriptors / Indicators: Sharp Pain Onset: On-going Patients Stated Pain Goal: 4 Pain Intervention(s): RN made aware Home Living/Prior Functioning Home Living Living Arrangements: Spouse/significant other Available Help at Discharge: Family, Available 24 hours/day Type of Home: House Home Access: Stairs to enter Technical brewer of Steps: 1 step Entrance Stairs-Rails: None Home Layout: One level Bathroom Shower/Tub: Tub/shower unit, Industrial/product designer: Yes Additional Comments: [facility] already [facility] on making accomodations to his bathroom  Lives With: Spouse, Family Prior Function Level of Independence: Requires assistive device for independence, Independent with basic ADLs,  Independent with transfers, Independent with gait, Independent with homemaking with ambulation (except for A with socks and shoes)  Able to Take Stairs?: No Driving: Yes Vocation: Retired Comments: wife and adults son, Elberta Fortis can assist in the home ADL ADL ADL Comments: refer to functional navigator Vision/Perception  Vision- History Baseline Vision/History: Wears glasses Wears Glasses: At all times Patient Visual Report: Other (comment) (dizziness ) Vision- Assessment Vision Assessment?: No apparent visual deficits Perception Comments: appears WFL  Cognition Overall Cognitive Status: Within Functional Limits for tasks assessed Arousal/Alertness: Awake/alert Orientation Level: Person;Place;Situation Person: Oriented Place: Oriented Situation: Oriented Year: 2017 Month: August Day of Week: Correct Memory: Appears intact Immediate Memory Recall: Sock;Blue;Bed Memory Recall: Sock;Blue;Bed Memory Recall Sock: With Cue Memory Recall Blue: Without Cue Memory Recall Bed: Without Cue Awareness: Appears intact Problem Solving: Appears intact Safety/Judgment: Appears intact Sensation Sensation Light Touch: Appears Intact Stereognosis: Appears Intact Hot/Cold: Appears Intact Proprioception: Appears Intact Additional Comments: heightened light touch appreciation due to pain in the LLE distal to the knee Coordination Gross Motor Movements are Fluid and Coordinated: No Fine Motor Movements are Fluid and Coordinated: Yes Coordination and Movement Description: pain limits coordination in the LLE.  Motor  Motor Motor: Other (comment) Motor - Skilled Clinical Observations: Gerneralizde weakness secondary to pain.  Mobility  Bed Mobility Bed Mobility: Rolling Right;Rolling Left;Supine to Sit;Sit to Supine Rolling Right: 2: Max assist Rolling Right Details: Verbal cues for technique;Verbal cues for precautions/safety;Manual facilitation for placement;Tactile cues for  placement Rolling Left: 3: Mod assist Rolling Left Details: Verbal cues for technique;Manual facilitation for weight shifting;Manual facilitation for placement;Tactile cues for placement Supine to Sit: 2: Max assist Supine to Sit Details: Manual facilitation for weight shifting;Manual facilitation for placement;Verbal cues for technique;Verbal cues for precautions/safety;Tactile cues for placement Sit to Supine: 2: Max assist Transfers  Sit to Stand: 2: Max assist (in [place] ) Sit to Stand Details: Verbal cues for technique;Verbal cues for precautions/safety;Manual facilitation for weight shifting;Manual facilitation for placement  Trunk/Postural Assessment  Cervical Assessment Cervical Assessment: Within Functional Limits Thoracic Assessment Thoracic Assessment: Within Functional Limits Lumbar Assessment Lumbar Assessment: Exceptions to [facility] Postural Control Postural Control: Within Functional Limits  Balance Balance Balance Assessed: Yes Static Sitting Balance Static Sitting - Balance Support: No upper extremity supported Static Sitting - Level of Assistance: 6: Modified independent (Device/Increase time) Dynamic Sitting Balance Dynamic Sitting - Balance Support: Right upper extremity supported Dynamic Sitting - Level of Assistance: 5: Stand by assistance Static Standing Balance Static Standing - Level of Assistance: Not tested (comment) Static Standing - Comment/# of Minutes: pt not able to achieve full stand in [place] with +3 assist Extremity/Trunk Assessment RUE Assessment RUE Assessment: Within Functional Limits LUE Assessment LUE Assessment: Within Functional Limits   See Function Navigator for Current Functional Status.   Refer to Care Plan for Long Term Goals  Recommendations for other services: None  Discharge Criteria: Patient will be discharged from OT if patient refuses treatment 3 consecutive times without medical reason, if treatment goals not met, if there is a  change in medical status, if patient makes no progress towards goals or if patient is discharged from hospital.  The above assessment, treatment plan, treatment alternatives and goals were discussed and mutually agreed upon: by patient  [facility] 08/03/2016, 12:14 PM

## 2016-08-03 NOTE — Discharge Summary (Signed)
NAME:  [name] NO.:  [id number]  MEDICAL RECORD NO.:  [id number]  LOCATION:                                 FACILITY:  PHYSICIAN:  Shanell Aden N. Terrence Dupont, [name] DATE OF BIRTH:  Nov 11, 1931  DATE OF ADMISSION:  07/28/2016 DATE OF DISCHARGE:  08/02/2016                              DISCHARGE SUMMARY   The patient will be transferred to inpatient rehab versus skilled nursing facility.  DISCHARGE HOME MEDICATIONS: 1. Amoxicillin 500 mg 1 capsule every 8 hours for 2 weeks. 2. Tylenol [phone] mg every 6 hours as needed for pain. 3. Aspirin 81 mg 1 tablet daily. 4. Nitroglycerin patch 0.4 mg/hour daily. 5. Crestor 10 mg 1 tablet daily. 6. Tramadol 50 mg every 6 hours as needed for moderate pain. 7. Metoprolol succinate 25 mg 1 tablet daily. 8. The patient has been advised to stop Benicar HCT for now.  DIET:  Low salt, low cholesterol.  ACTIVITY:  Increase activity as tolerated.  The patient has been followed up by me in 2 weeks.  The patient has appointment to see the urologist at [facility] in 1 week.  CONDITION AT DISCHARGE:  Stable.  BRIEF HISTORY AND HOSPITAL COURSE:  Mr. 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  He was admitted by Dr. Doylene Canard on 08/20 because of recurrent pelvic pain for last 2 days.  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  The urinalysis was positive for severe UTI, and abdominal and pelvic exam was positive for severe cystitis with nonobstructing stone.  The patient denies any fever or chills.  PHYSICAL  EXAMINATION:  VITAL SIGNS:  His blood pressure was 112/93, pulse 88, and he was afebrile. HEENT:  Conjunctiva were pink. NECK:  Supple.  No JVD.  No bruits. LUNGS:  Clear to auscultation bilaterally. CARDIOVASCULAR:  S1, S2 are normal.  There was 2/6 systolic murmur.  No rub or gallop. ABDOMEN:  Soft. Bowel sounds are present.  Nontender. EXTREMITIES:  There was no clubbing, cyanosis, or edema. NEUROLOGIC:  Grossly intact.  LABORATORY DATA:  His sodium was 142, potassium 5.2, BUN 29, creatinine was 2.10, glucose was 108.  His hemoglobin was 12.6, hematocrit 37; repeat hemoglobin was 10, hematocrit 31.8, white count of 15.5. Urinalysis:  Urine was turbid, but with many bacteria.  Urine culture grew Aerococcus urinae more than 100,000 colonies per mL.  CT of the abdomen and pelvis showed moderate right-sided and mild left-sided hydronephrosis.  No distal obstructive stone was seen.  The patient has also severe cystitis, diffuse irregular bladder wall thickening and prominence surrounding soft inflammation compatible with cystitis. Underlying mass could not be excluded.  There was nonobstructive 8 mm stone at the lower pole of the right kidney, scattered calcification along the abdominal aorta and branches, scattered diverticulosis along the descending and sigmoid colon  without evidence of diverticulitis. Repeat renal ultrasound showed mild right-sided hydronephrosis, decreased compared to hydronephrosis seen on recent CT on 08/19.  An 8 mm right renal stone, left kidney is unremarkable without hydronephrosis.  Mild hydronephrosis seen on earlier CT has resolved. The bladder decompressed by Foley catheter.  BRIEF HOSPITAL COURSE:  The patient was admitted to telemetry unit.  The patient's pancultures were obtained.  The patient was started on IV Rocephin.  Urine culture grew Aerococcus.  His IV antibiotics were switched to p.o. amoxicillin.  Urology consultation was obtained.   The patient was placed on Foley catheter with gradual improvement in his renal function to baseline.  His electrolytes; sodium was 139, potassium 4.1, BUN 21, creatinine is sometimes baseline at 1.22.  His hemoglobin has remained stable at 9.1, hematocrit 28.5, white count of 10.5. Repeat PT consultation was obtained.  The patient had difficulty ambulating.  Subsequently, inpatient rehab consult was called, also for possible transfer to inpatient rehab versus skilled nursing facility. The patient has remained afebrile during the hospital stay.  Urology recommended to leave indwelling Foley catheter for now until he follows up with his urologist at [facility] in 1 week.  The patient will be transferred to inpatient rehab or skilled nursing facility later today.     Allegra Lai. Terrence Dupont, [name]   ______________________________ Allegra Lai. Terrence Dupont, [name]    MNH/MEDQ  D:  08/02/2016  T:  08/03/2016  Job:  [id number]

## 2016-08-03 NOTE — Interval H&P Note (Deleted)
[name] was admitted today to Inpatient Rehabilitation with the diagnosis of cystitis/hydronephrosis.  The patient's history has been reviewed, patient examined, and there is no change in status.  Patient continues to be appropriate for intensive inpatient rehabilitation.  I have reviewed the patient's chart and labs.  Questions were answered to the patient's satisfaction. The PAPE has been reviewed and assessment remains appropriate.  [name] [name] 08/03/2016, 8:18 AM

## 2016-08-03 NOTE — Progress Notes (Signed)
Occupational Therapy Session Note  Patient Details  Name: [name] MRN: [id number] Date of Birth: August 28, 1931  Today's Date: 08/03/2016 OT Individual Time: [id number] OT Individual Time Calculation (min): 58 min    Skilled Therapeutic Interventions/Progress Updates:   Pt with increased pain in the left knee as well as the right knee and ankle but agreed to work with therapy.  Mod to max assist for transfer from supine to sit EOB.  Pt reports having an elevated automatic bed at home so HOB left up at 45 degrees.  Worked on sit to stand X 2 from elevated EOB with use of the RW for support.  Pt needing max assist for sit to stand.  He was able to maintain standing for 1 min approximately, with wide BOS and flexed trunk before needing to sit down to rest.  He was unable to take steps for stand pivot transfer however, so completed sliding board transfer with mod assist to the wheelchair.  Pt left in the chair with call button and phone in reach.   Therapy Documentation Precautions:  Precautions Precautions: Fall Precaution Comments: L knee/leg pain Restrictions Weight Bearing Restrictions: No  Pain: Pain Assessment Pain Assessment: Faces Pain Score: Asleep Faces Pain Scale: Hurts little more Pain Type: Acute pain Pain Location: Ankle Pain Orientation: Right;Left Pain Onset: On-going Patients Stated Pain Goal: 4 Pain Intervention(s): Repositioned 2nd Pain Site Wong-Baker Pain Rating: Hurts little more Pain Type: Acute pain Pain Location: Knee Pain Orientation: Left Pain Descriptors / Indicators: Burning Pain Intervention(s): Repositioned;Emotional support ADL: See Function Navigator for Current Functional Status.   Therapy/Group: Individual Therapy  Cindee Mclester OTR/L 08/03/2016, 4:08 PM

## 2016-08-03 NOTE — Progress Notes (Addendum)
Initial Nutrition Assessment  DOCUMENTATION CODES:   Obesity unspecified  INTERVENTION:  Continue Nepro Shake po BID, each supplement provides 425 kcal and 19 grams protein.  Encourage adequate PO intake.   NUTRITION DIAGNOSIS:   Inadequate oral intake related to poor appetite as evidenced by per patient/family report.  GOAL:   Patient will meet greater than or equal to 90% of their needs  MONITOR:   PO intake, Supplement acceptance, Labs, Weight trends, Skin, I & O's  REASON FOR ASSESSMENT:   Malnutrition Screening Tool    ASSESSMENT:   82 y.o. male with history of hypertension, CAD status post CABG, BPH with kidney stones. Presented 07/29/2016 with recurrent pelvic pain 2 days. CT abdomen and pelvis showed moderate right sided mild left-sided hydronephrosis. No distal obstruction seen. Diffuse irregular bladder wall thickening and prominent surrounding soft inflammation compatible with cystitis. Follow-up renal ultrasound again showed mild hydronephrosis.  Meal completion has been varied from 10-100%. Pt reports having a lack of appetite which has been ongoing over the past 1 month. Wife at bedside reports pt with poor po intake and would consume only 50-75% of food at meals. Pt reports he has consumed Ensure at home however has not drank in ~1 months time. Pt reports weight loss, however unable to quantify amount lost. Per Epic weight records, pt with no significant weight loss over the past 1 year. Pt currently has Nepro Shake ordered and has been consuming them. RD to continue with current orders. Pt request information on appetite stimulant medications. Wife and pt plan on discussing with MD about it.   Pt with no observed significant fat or muscle mass loss.   Labs and medications reviewed.   Diet Order:  Diet renal with fluid restriction Fluid restriction: 1800 mL Fluid; Room service appropriate? Yes; Fluid consistency: Thin  Skin:  Reviewed, no issues  Last BM:   8/22  Height:   Ht Readings from Last 1 Encounters:  07/28/16 6\' 3"  (1.905 m)    Weight:   Wt Readings from Last 1 Encounters:  08/03/16 282 lb (127.9 kg)    Ideal Body Weight:  89.09 kg  BMI:  Body mass index is 35.25 kg/m.  Estimated Nutritional Needs:   Kcal:  1900-2100  Protein:  90-105 grams  Fluid:  1.8 L/day  EDUCATION NEEDS:   Education needs addressed  Corrin Parker, MS, RD, LDN Pager # [phone] After hours/ weekend pager # [phone]

## 2016-08-03 NOTE — Progress Notes (Signed)
Social Work Assessment and Plan Social Work Assessment and Plan  Patient Details  Name: [name] MRN: [id number] Date of Birth: 1931-06-08  Today's Date: 08/03/2016  Problem List:  Patient Active Problem List   Diagnosis Date Noted  . Acute cystitis with hematuria   . Acute blood loss anemia   . Anemia of chronic disease   . Other secondary hypertension   . Acute idiopathic gout of left knee   . Urinary retention   . Debilitated 08/02/2016  . Bilateral hydronephrosis   . Normochromic normocytic anemia   . E-coli UTI   . Benign essential HTN   . Coronary artery disease involving coronary bypass graft of native heart without angina pectoris   . BPH (benign prostatic hyperplasia)   . Kidney stones   . CKD (chronic kidney disease)   . Morbid obesity due to excess calories ([place])   . Acute renal failure (ARF) ([place]) 07/29/2016   Past Medical History:  Past Medical History:  Diagnosis Date  . CAD (coronary artery disease)   . Hypertension   . Kidney stones   . Prostate CA [facility])    Past Surgical History:  Past Surgical History:  Procedure Laterality Date  . CARDIAC SURGERY    . CORONARY ARTERY BYPASS GRAFT    . PENILE PROSTHESIS IMPLANT    . PROSTATE SURGERY     Social History:  reports that he has never smoked. He has never used smokeless tobacco. He reports that he uses drugs, including Marijuana. He reports that he does not drink alcohol.  Family / Support Systems Marital Status: Married Patient Roles: Spouse, Parent Spouse/Significant Other: Alice  [id number] Children: Anthony-son and Eric-son both living with pt at this time Other Supports: friends and church members Anticipated Caregiver: Wife and son's who can assist if needed Ability/Limitations of Caregiver: Wife is 17 and very active was caring for their great gandhildren-8 & 10 when out of school Caregiver Availability: 24/7 Family Dynamics: Close knit family who are supportive and involved with one  another. Both son's are willing to assist him if needed. Pt's main caregiver will be his wife who is in good health and was assisting prior to admission to the hospital.  Social History Preferred language: English Religion: Jehovah's Witness Cultural Background: No issues Education: High School Read: Yes Write: Yes Employment Status: Retired Freight forwarder Issues: No issues Guardian/Conservator: None-according to MD pt is capable of making his own decisions while here. Will make sure wife is here if any decisions need to made while here.   Abuse/Neglect Physical Abuse: Denies Verbal Abuse: Denies Sexual Abuse: Denies Exploitation of patient/patient's resources: Denies Self-Neglect: Denies  Emotional Status Pt's affect, behavior adn adjustment status: Pt is motivated to do well and is trying to push through the pain. He reports: " At times it is unbearable."  He will need his therapies spread out due to all together were too tiresome for him today. He is used to doing for himself and wants to get back to this level. Recent Psychosocial Issues: other health issues-recent bladder cancer diagnosed in Jan of this year Pyschiatric History: No history he is able to express himself and the concerns he has. Will defer depression screen due to at the present time is coping appropriately.  Will monitor and have neuro-psych see is team feels it would be beneficial. Substance Abuse History: No issues  Patient / Family Perceptions, Expectations & Goals Pt/Family understanding of illness & functional limitations: Pt and wife can  explain his medical issues, bothhave spoken with the MD and feel thier questions and concerns are being addressed. Wife is in charge of everything according to the pt and he likes it like that. Premorbid pt/family roles/activities: Husband, father, grandfather, great grandfather, retiree, church member, etc Anticipated changes in roles/activities/participation:  resume Pt/family expectations/goals: Pt states: " I want to be able to do for myself before I leave here, my wife can help if I need it."  Wife states: " I need him stronger and mobile he is a big man, I can't lift him."  [employer]: None Premorbid Home Care/DME Agencies: None Transportation available at discharge: Wife and son's Resource referrals recommended: Support group (specify)  Discharge Planning Living Arrangements: Spouse/significant other, Children Support Systems: Spouse/significant other, Children, Other relatives, Friends/neighbors, Social worker community Type of Residence: Private residence Insurance Resources: [employer], Multimedia programmer (specify) Nurse, mental health) Financial Resources: Fish farm manager, Family Support Financial Screen Referred: No Living Expenses: Lives with family Money Management: Spouse, Patient Does the patient have any problems obtaining your medications?: No Home Management: Wife does the home management Patient/Family Preliminary Plans: Return home with wife and son's. His son-Eric is getting ready to move out and in with his girlfriend but his other son-Anthony can assist who is in their home. His wife was a CNA here in the hospital before she retired and can assist. Will await team evaluations and work on discharge plans. Social Work Anticipated Follow Up Needs: HH/OP, Support Group  Clinical Impression Pleasant gentleman who's main issue is his strength and endurance. He is here to regain this and get back to his independent level. His wife is healthy and able to assist if needed. Will await team's evaluations and work on a Safe discharge plan. Pt is trying to work through his pain to participate in therapies. He does need his therapies spread out, will talk with scheduler.    [name] 08/03/2016, 1:14 PM

## 2016-08-03 NOTE — Progress Notes (Signed)
Patient currently remains off aspirin 81 mg daily per cardiology services and was discontinued some time ago per patient.

## 2016-08-03 NOTE — Progress Notes (Signed)
Pt came to the floor at 2210 on 08/02/16. Alert and oriented x4. C/O left leg pain. No respiratory distress noted. Foley intact with clear , yellow urine. Repositioned to left side. Will continue to monitor.

## 2016-08-04 ENCOUNTER — Inpatient Hospital Stay (HOSPITAL_COMMUNITY): Payer: Medicare Other | Admitting: Occupational Therapy

## 2016-08-04 ENCOUNTER — Inpatient Hospital Stay (HOSPITAL_COMMUNITY): Payer: Medicare Other

## 2016-08-04 ENCOUNTER — Inpatient Hospital Stay (HOSPITAL_COMMUNITY): Payer: Medicare Other | Admitting: *Deleted

## 2016-08-04 DIAGNOSIS — I10 Essential (primary) hypertension: Secondary | ICD-10-CM

## 2016-08-04 MED ORDER — LISINOPRIL 5 MG PO TABS
5.0000 mg | ORAL_TABLET | Freq: Every day | ORAL | Status: DC
  Administered 2016-08-04: 5 mg via ORAL
  Filled 2016-08-04 (×2): qty 1

## 2016-08-04 NOTE — Progress Notes (Signed)
Occupational Therapy Session Note  Patient Details  Name: [name] MRN: [id number] Date of Birth: 07-06-1931  Today's Date: 08/04/2016 OT Individual Time: 1000-1130 OT Individual Time Calculation (min): 90 min    Short Term Goals: Week 1:  OT Short Term Goal 1 (Week 1): Pt will be able to use a slide board to a heavy duty drop arm BSC with mod A. OT Short Term Goal 2 (Week 1): Pt will be able to tolerate have B knees in flexion at EOB to prepare for LB dressing. OT Short Term Goal 3 (Week 1): Pt will be able to sit to stand with +2 from EOB with mod A to prepare for LB dressing.  Skilled Therapeutic Interventions/Progress Updates:   patient sitting in wc upon approach and agreed to work on bathing and dressing seated in w/c at sink.  Patient required verbal reminders to sit forward to facilitate core utilization and strength.   With the particular  Setup, he had to reach forward unsupported to reach into sink/basin to access water and wash cloth.  Patient stated he could not reach his legs or feet to wash.  But he was able to reach forward to wash down to middle legs, and he utilized throwing down the middle of the wash cloth to wash bottoms of feet and was able to use long handled sponge to wash feet.  Patient had prior to therapy had his bottom washed and brief change by nursing per patient report.  Patient was left sitting in w./c with call bell and phone in place at the end of the session.  Therapy Documentation Precautions:  Precautions Precautions: Fall Precaution Comments: L knee/leg pain Restrictions Weight Bearing Restrictions: No    Pain:"ouch" & "it's pressured sore" with all movements to left knee but he did not rate it    See Function Navigator for Current Functional Status.   Therapy/Group: Individual Therapy  Herschell Dimes 08/04/2016, 3:39 PM

## 2016-08-04 NOTE — Interval H&P Note (Signed)
[name] was admitted 08/02/16 to Inpatient Rehabilitation with the diagnosis of cystitis/hydronephrosis.  The patient's history has been reviewed, patient examined, and there is no change in status.  Patient continues to be appropriate for intensive inpatient rehabilitation.  I have reviewed the patient's chart and labs.  Questions were answered to the patient's satisfaction. The PAPE has been reviewed and assessment remains appropriate.  [name] 08/04/2016, 7:26 AM

## 2016-08-04 NOTE — Progress Notes (Signed)
[facility] PHYSICAL MEDICINE & REHABILITATION     PROGRESS NOTE  Subjective/Complaints:  Pt sleeping comfortably in bed.  He states he had a good first day in therapies yesterday.   ROS:  Denies CP, SOB, N/V/D.  Objective: Vital Signs: Blood pressure (!) 151/83, pulse 67, temperature 98.6 F (37 C), temperature source Oral, resp. rate 18, weight 128.6 kg (283 lb 8.2 oz), SpO2 97 %. Dg Knee 1-2 Views Left  Result Date: 08/02/2016 CLINICAL DATA:  Left knee pain EXAM: LEFT KNEE - 1-2 VIEW COMPARISON:  None. FINDINGS: No fracture or dislocation is seen. Moderate tricompartmental degenerative changes, most prominent in the patellofemoral compartment. Moderate suprapatellar knee joint effusion. IMPRESSION: No fracture or dislocation is seen. Moderate degenerative changes. Moderate suprapatellar knee joint effusion. Electronically Signed   By: Julian Hy [name]   On: 08/02/2016 17:18   Dg Ankle 2 Views Right  Result Date: 08/02/2016 CLINICAL DATA:  Right ankle pain EXAM: RIGHT ANKLE - 2 VIEW COMPARISON:  None. FINDINGS: No fracture or dislocation is seen. The ankle mortise is intact. Degenerative changes along the tibiotalar joint. Mild spurring along the medial malleolus, likely posttraumatic. Small posterior and plantar calcaneal enthesophytes. Mild degenerative changes of the dorsal midfoot. Mild lateral soft tissue swelling. IMPRESSION: No fracture or dislocation is seen. Degenerative changes of the tibiotalar joint, as above, possibly posttraumatic. Electronically Signed   By: Julian Hy [name]   On: 08/02/2016 17:22    Recent Labs  08/02/16 2242 08/03/16 0641  WBC 10.0 10.7*  HGB 9.8* 10.1*  HCT 30.1* 30.8*  PLT 231 230    Recent Labs  08/02/16 2242 08/03/16 0641  NA  --  135  K  --  3.8  CL  --  101  GLUCOSE  --  109*  BUN  --  13  CREATININE 1.16 0.99  CALCIUM  --  9.0   CBG (last 3)  No results for input(s): GLUCAP in the last 72 hours.  Wt Readings from Last  3 Encounters:  08/04/16 128.6 kg (283 lb 8.2 oz)  08/02/16 129.8 kg (286 lb 3.2 oz)  09/16/15 (!) 140.6 kg (310 lb)    Physical Exam:  BP (!) 151/83 (BP Location: Right Arm)   Pulse 67   Temp 98.6 F (37 C) (Oral)   Resp 18   Wt 128.6 kg (283 lb 8.2 oz)   SpO2 97%   BMI 35.44 kg/m  Constitutional: He appears well-developed. Obese. Vital signs reviewed.  HENT: Normocephalicand atraumatic.  Eyes: Conjunctivaeand EOMare normal.  Cardiovascular: Normal rateand regular rhythm.  Respiratory: Effort normaland breath sounds normal. No respiratory distress.  GI: Soft. He exhibits no distension. Musculoskeletal: LE edema tenderness (improving) Left knee effusion with tenderness , no erythema Right ankle pain with ROM but no tenderness or erythema Neurological: He is alert.  Follows simple commands Motor: B/l UE:5/5 proximal to distal RLE: Hip flexion 3+/5, knee extension 4-/5, ankle dorsi/plantar flexion 4-/5 LLE: Hip flexion 2+/5, knee extension 2+/5, ankle dorsi/plantar flexion 3-/5 Skin: Skin is warmand dry.  Venous stasis changes b/l LE, R>L Psychiatric: He has a normal mood and affect. His behavior is normal   Assessment/Plan: 1. Functional deficits secondary to cystitis/hydronephrosis which require 3+ hours per day of interdisciplinary therapy in a comprehensive inpatient rehab setting. Physiatrist is providing close team supervision and 24 hour management of active medical problems listed below. Physiatrist and rehab team continue to assess barriers to discharge/monitor patient progress toward functional and medical goals.  Function:  Bathing Bathing position   Position: Bed  Bathing parts Body parts bathed by patient: Right arm, Left arm, Chest, Abdomen, Right upper leg, Front perineal area Body parts bathed by helper: Buttocks, Left upper leg, Right lower leg, Left lower leg, Back  Bathing assist        Upper Body Dressing/Undressing Upper body dressing    What is the patient wearing?: Hospital gown                Upper body assist        Lower Body Dressing/Undressing Lower body dressing   What is the patient wearing?: Hospital Gown, Non-skid slipper socks           Non-skid slipper socks- Performed by helper: Don/doff right sock, Don/doff left sock                  Lower body assist Assist for lower body dressing: 2 Helpers      Toileting Toileting          Toileting assist     Transfers Chair/bed transfer   Chair/bed transfer method: Lateral scoot Chair/bed transfer assist level: 2 helpers Chair/bed transfer assistive device: Mechanical lift Mechanical lift: Maximove   Locomotion Ambulation Ambulation activity did not occur: Safety/medical Editor, commissioning activity did not occur: N/A (New pt, will be evaluated by PT in AM) Type: Manual Max wheelchair distance: 164f  Assist Level: Touching or steadying assistance (Pt > 75%)  Cognition Comprehension Comprehension assist level: Understands complex 90% of the time/cues 10% of the time  Expression Expression assist level: Expresses basic needs/ideas: With no assist  Social Interaction Social Interaction assist level: Interacts appropriately with others with medication or extra time (anti-anxiety, antidepressant).  Problem Solving Problem solving assist level: Solves complex 90% of the time/cues < 10% of the time  Memory Memory assist level: Recognizes or recalls 90% of the time/requires cueing < 10% of the time     Medical Problem List and Plan: 1. Debilitationsecondary to cystitis/hydronephrosis  Cont CIR 2. DVT Prophylaxis/Anticoagulation: Subcutaneous heparin. Monitor platelet counts and any signs of bleeding 3. Pain Management: Ultram as needed 4. UTI/Aerococcus. Amoxicillin, will confirm length of therapy 5. Neuropsych: This patient iscapable of making decisions on hisown behalf. 6. Skin/Wound Care: Routine skin  checks 7. Fluids/Electrolytes/Nutrition: Routine I&O's  BMP WNL on 8/25  Labs ordered for Monday 8.CAD status post CABG. Discuss resumingaspirinwith Dr. HTerrence Dupont9.Acute on chronic anemia.   Hb 10.1 on 8/25  Labs ordered for Monday  Will cont to monitor 10.Hyperlipidemia. Resume Crestor 11.  Left knee effusion as well as right ankle joint pain   Uric acid WNL 8/24  ESR elevated 8/24  Xrays reviewed, showing degenerative changes in ankle and effusion in knee.  Will start medrol dosepack 8/25 12. HTN  Will monitor with increased activity  Lisinopril 5 started 8/26 13. Urinary retention  Foley placed by Urology on 8/21  Will plan to d/c early week of week 8/28 and consider voiding trial  LOS (Days) 2 A FACE TO FACE EVALUATION WAS PERFORMED  Evangelia Whitaker ALorie Phenix8/26/2017 7:27 AM

## 2016-08-04 NOTE — Progress Notes (Signed)
Physical Therapy Session Note  Patient Details  Name: [name] MRN: [id number] Date of Birth: 1931/08/05  Today's Date: 08/04/2016 PT Individual Time: 0800-0900 PT Individual Time Calculation (min): 60 min      Skilled Therapeutic Interventions/Progress Updates:  Patient in bed at the beginning of session, agrees to therapy despite pain in L Le at 7/10/ Rn present, administered pain medicine. Bed mobility with use of bed rails mod A, coming to sit EOB with max A , manual facilitation of R LE off bed, increased time to complete transfer.  Patient ate breakfast sitting EOB no back support good sitting balance.  Patient requested to go to the bathroom. Sit to stand with Steady with max A and increased time needed due to pain and cues needed for technique and sequencing. Transferred to w/c.  In the bathroom stand pivot with grab bar to commode ( raised commode over toilet ) with max A patient able to pivot to sit down and use the bathroom.  Max A for hygiene.  Stand pivot transfer back to w/c with max A and cues for sequencing, decreased ability to bear weight on l LE due to pain.  Brushed teeth w/c level,no assistance needed.   At the end of session patient left in w/c with all needs within reach.     Therapy Documentation Precautions:  Precautions Precautions: Fall Precaution Comments: L knee/leg pain Restrictions Weight Bearing Restrictions: No   See Function Navigator for Current Functional Status.   Therapy/Group: Individual Therapy  [name] 08/04/2016, 9:11 AM

## 2016-08-04 NOTE — Progress Notes (Signed)
Occupational Therapy Session Note  Patient Details  Name: [name] MRN: [id number] Date of Birth: 04-19-1931  Today's Date: 08/04/2016 OT Individual Time: 1300-1400 OT Individual Time Calculation (min): 60 min     Short Term Goals: Week 1:  OT Short Term Goal 1 (Week 1): Pt will be able to use a slide board to a heavy duty drop arm BSC with mod A. OT Short Term Goal 2 (Week 1): Pt will be able to tolerate have B knees in flexion at EOB to prepare for LB dressing. OT Short Term Goal 3 (Week 1): Pt will be able to sit to stand with +2 from EOB with mod A to prepare for LB dressing.  Skilled Therapeutic Interventions/Progress Updates:   Focus iniitally was on core strength to increasing functional status with sliding board transfers and overall strength, but Patient stated he was unable to do forward leaning as it "hurts right where I had the bladder surgery."   He did concur to complete UE strengthening and endurance activities (seated), including forward 'bicycle circles' with arms, sitting unsupported in order to increase both stamina and core strength along with arm strength.   After trying to change directions with the bicycle rotations, he stated, "I am not coordinated enough to do that."  He tried once and then would not try again.    With resistance he tried using the sock aide, reacher but returning demonstration with Min A, he stated, "My wife will do that for me."  When asked if patient wanted to increase his independence with lower body dressing, he stated, "My wife used to be a Chartered certified accountant, and she will be happy to do it."  Wife came in at end of session and stated, "He used to do all his bathing and dressing and everything before he came to the hospital.   I did not help him do anything."  Patient left in room with call bell, hi low table and phone within reach.  Therapy Documentation Precautions:  Precautions Precautions: Fall Precaution Comments: L knee/leg  pain Restrictions Weight Bearing Restrictions: No  Pain: "ouch.   That left leg is has sore pressure from the knee on down. " but he did not rate the pain and did not ask for pain meds   See Function Navigator for Current Functional Status.   Therapy/Group: Individual Therapy  Alfredia Ferguson [facility] 08/04/2016, 4:10 PM

## 2016-08-05 ENCOUNTER — Inpatient Hospital Stay (HOSPITAL_COMMUNITY): Payer: Medicare Other | Admitting: Physical Therapy

## 2016-08-05 DIAGNOSIS — N433 Hydrocele, unspecified: Secondary | ICD-10-CM

## 2016-08-05 DIAGNOSIS — R52 Pain, unspecified: Secondary | ICD-10-CM

## 2016-08-05 MED ORDER — LISINOPRIL 10 MG PO TABS
10.0000 mg | ORAL_TABLET | Freq: Every day | ORAL | Status: DC
  Administered 2016-08-05 – 2016-08-11 (×7): 10 mg via ORAL
  Filled 2016-08-05 (×8): qty 1

## 2016-08-05 MED ORDER — BISACODYL 10 MG RE SUPP
10.0000 mg | Freq: Every day | RECTAL | Status: DC | PRN
  Administered 2016-08-05 – 2016-08-12 (×2): 10 mg via RECTAL
  Filled 2016-08-05 (×2): qty 1

## 2016-08-05 NOTE — Progress Notes (Signed)
Physical Therapy Session Note  Patient Details  Name: [name] MRN: [id number] Date of Birth: 02-25-1931  Today's Date: 08/05/2016 PT Individual Time: 0730-0845 PT Individual Time Calculation (min): 75 min    Short Term Goals: Week 1:  PT Short Term Goal 1 (Week 1): patient will perform sit<>stand with mod A and RW.  PT Short Term Goal 2 (Week 1): patient will performed stand pivot/squat pivot with max A and LRAD  PT Short Term Goal 3 (Week 1): Patient will initiate gait training with LRAD.  PT Short Term Goal 4 (Week 1): Patient will performed bed mobility with min A   Skilled Therapeutic Interventions/Progress Updates:   Pt received supine in bed, c/o LLE pain 7/10 "from the knee down"; declines participation in therapy initially reporting "that lady told me not to do any therapy until all the doctors could come see me"; RN clarified with MD and pt able to participate in therapy. Rolling R/L with bedrails and S to change brief after incontinent bladder accident.  Supine>sit with bedrails and HOB flat, S and increased time. Sit <>stand in stedy modA +1 with bed elevated; pillow placed in front of knees to reduce pain in LLE. Transferred to w/c with stedy totalA. Safety plan updated to reflect change in recommendation to use stedy for increased functional use of LEs and pt assist; RN alerted. Seated in w/c, pt ate breakfast with setupA and noted decreased [name] BUE while eating. Sit <>stand in stedy mod/maxA from w/c. Seated in stedy at sink, pt performed oral hygiene and upper body bathing with setupA and S for dynamic sitting balance. Remained seated in recliner with LEs elevated and all needs in reach at end of session.   Therapy Documentation Precautions:  Precautions Precautions: Fall Precaution Comments: L knee/leg pain Restrictions Weight Bearing Restrictions: No   See Function Navigator for Current Functional Status.   Therapy/Group: Individual Therapy  Luberta Mutter 08/05/2016, 9:02 AM

## 2016-08-05 NOTE — Plan of Care (Signed)
Problem: RH BOWEL ELIMINATION Goal: RH STG MANAGE BOWEL WITH ASSISTANCE STG Manage Bowel with min Assistance.   Outcome: Not Progressing LBM 07/30/16  Problem: RH BLADDER ELIMINATION Goal: RH STG MANAGE BLADDER WITH ASSISTANCE STG Manage Bladder With total Assistance  Outcome: Not Progressing Patient complained of pain Foley removed

## 2016-08-05 NOTE — Progress Notes (Signed)
[place] PHYSICAL MEDICINE & REHABILITATION     PROGRESS NOTE  Subjective/Complaints:  Pt resting in bed.  He had some pain in his penis and scrotum last night and his foley was removed.  Since then, he states he feels he is doing much better and believes his edema has nearly resolved. .   ROS:  Denies CP, SOB, N/V/D.  Objective: Vital Signs: Blood pressure (!) 141/87, pulse 64, temperature 97.5 F (36.4 C), temperature source Oral, resp. rate 18, weight 127 kg (279 lb 14.4 oz), SpO2 93 %. US Scrotum  Result Date: 08/05/2016 CLINICAL DATA:  RIGHT testicular pain beginning this afternoon, resolution of symptoms after catheter removal. History of prostate cancer, penile prosthesis. EXAM: SCROTAL ULTRASOUND DOPPLER ULTRASOUND OF THE TESTICLES TECHNIQUE: Complete ultrasound examination of the testicles, epididymis, and other scrotal structures was performed. Color and spectral Doppler ultrasound were also utilized to evaluate blood flow to the testicles. COMPARISON:  CT abdomen and pelvis July 28, 2016 FINDINGS: Right testicle Measurements: 4.4 x 2.1 x 2.4 cm. No mass or microlithiasis visualized. Left testicle Measurements: 4.3 x 2.2 x 1.5 cm. No mass or microlithiasis visualized. Right epididymis:  Normal in size and vascularity. Left epididymis: 7 mm anechoic cyst. Normal and size and vascularity Hydrocele:  Complex small bilateral hydroceles. Varicocele:  None visualized. Pulsed Doppler interrogation of both testes demonstrates normal low resistance arterial and venous waveforms bilaterally. Penile prosthesis identified. IMPRESSION: Small complex bilateral hydroceles.  No acute testicular process. Penile implant. Electronically Signed   By: Elon Alas [name]   On: 08/05/2016 01:11   [place] Art/ven Flow Abd Pelv Doppler  Result Date: 08/05/2016 CLINICAL DATA:  RIGHT testicular pain beginning this afternoon, resolution of symptoms after catheter removal. History of prostate cancer, penile  prosthesis. EXAM: SCROTAL ULTRASOUND DOPPLER ULTRASOUND OF THE TESTICLES TECHNIQUE: Complete ultrasound examination of the testicles, epididymis, and other scrotal structures was performed. Color and spectral Doppler ultrasound were also utilized to evaluate blood flow to the testicles. COMPARISON:  CT abdomen and pelvis July 28, 2016 FINDINGS: Right testicle Measurements: 4.4 x 2.1 x 2.4 cm. No mass or microlithiasis visualized. Left testicle Measurements: 4.3 x 2.2 x 1.5 cm. No mass or microlithiasis visualized. Right epididymis:  Normal in size and vascularity. Left epididymis: 7 mm anechoic cyst. Normal and size and vascularity Hydrocele:  Complex small bilateral hydroceles. Varicocele:  None visualized. Pulsed Doppler interrogation of both testes demonstrates normal low resistance arterial and venous waveforms bilaterally. Penile prosthesis identified. IMPRESSION: Small complex bilateral hydroceles.  No acute testicular process. Penile implant. Electronically Signed   By: Elon Alas [name]   On: 08/05/2016 01:11    Recent Labs  08/02/16 2242 08/03/16 0641  WBC 10.0 10.7*  HGB 9.8* 10.1*  HCT 30.1* 30.8*  PLT 231 230    Recent Labs  08/02/16 2242 08/03/16 0641  NA  --  135  K  --  3.8  CL  --  101  GLUCOSE  --  109*  BUN  --  13  CREATININE 1.16 0.99  CALCIUM  --  9.0   CBG (last 3)  No results for input(s): GLUCAP in the last 72 hours.  Wt Readings from Last 3 Encounters:  08/05/16 127 kg (279 lb 14.4 oz)  08/02/16 129.8 kg (286 lb 3.2 oz)  09/16/15 (!) 140.6 kg (310 lb)    Physical Exam:  BP (!) 141/87 (BP Location: Left Arm)   Pulse 64   Temp 97.5 F (36.4 C) (Oral)  Resp 18   Wt 127 kg (279 lb 14.4 oz)   SpO2 93%   BMI 34.99 kg/m  Constitutional: He appears well-developed. Obese. Vital signs reviewed.  HENT: Normocephalicand atraumatic.  Eyes: Conjunctivaeand EOMare normal.  Cardiovascular: Normal rateand regular rhythm.  Respiratory: Effort  normaland breath sounds normal. No respiratory distress.  GI: Soft. He exhibits no distension. Musculoskeletal: LE edema tenderness (improving) Left knee effusion with tenderness, no erythema Right ankle pain with ROM but no tenderness or erythema No scrotal edema Neurological: He is alert.  Follows simple commands Motor: B/l UE:5/5 proximal to distal RLE: Hip flexion 3+/5, knee extension 4-/5, ankle dorsi/plantar flexion 4-/5 LLE: Hip flexion 2+/5, knee extension 2+/5, ankle dorsi/plantar flexion 3-/5 Skin: Skin is warmand dry.  Venous stasis changes b/l LE, R>L Psychiatric: He has a normal mood and affect. His behavior is normal   Assessment/Plan: 1. Functional deficits secondary to cystitis/hydronephrosis which require 3+ hours per day of interdisciplinary therapy in a comprehensive inpatient rehab setting. Physiatrist is providing close team supervision and 24 hour management of active medical problems listed below. Physiatrist and rehab team continue to assess barriers to discharge/monitor patient progress toward functional and medical goals.  Function:  Bathing Bathing position   Position: Bed  Bathing parts Body parts bathed by patient: Right arm, Left arm, Chest, Abdomen, Right upper leg, Front perineal area Body parts bathed by helper: Buttocks, Left upper leg, Right lower leg, Left lower leg, Back  Bathing assist        Upper Body Dressing/Undressing Upper body dressing   What is the patient wearing?: Hospital gown                Upper body assist        Lower Body Dressing/Undressing Lower body dressing   What is the patient wearing?: Hospital Gown, Non-skid slipper socks           Non-skid slipper socks- Performed by helper: Don/doff right sock, Don/doff left sock                  Lower body assist Assist for lower body dressing: 2 Helpers      Toileting Toileting          Toileting assist     Transfers Chair/bed transfer    Chair/bed transfer method: Stand pivot Chair/bed transfer assist level: Maximal assist (Pt 25 - 49%/lift and lower) Chair/bed transfer assistive device: Mechanical lift Mechanical lift: Maximove   Locomotion Ambulation Ambulation activity did not occur: Safety/medical Editor, commissioning activity did not occur: N/A (New pt, will be evaluated by PT in AM) Type: Manual Max wheelchair distance: 11f  Assist Level: Touching or steadying assistance (Pt > 75%)  Cognition Comprehension Comprehension assist level: Follows complex conversation/direction with no assist  Expression Expression assist level: Expresses complex ideas: With no assist  Social Interaction Social Interaction assist level: Interacts appropriately with others - No medications needed.  Problem Solving Problem solving assist level: Solves complex problems: Recognizes & self-corrects  Memory Memory assist level: Complete Independence: No helper     Medical Problem List and Plan: 1. Debilitationsecondary to cystitis/hydronephrosis  Cont CIR 2. DVT Prophylaxis/Anticoagulation: Subcutaneous heparin. Monitor platelet counts and any signs of bleeding 3. Pain Management: Ultram as needed 4. UTI/Aerococcus. Amoxicillin, will confirm length of therapy 5. Neuropsych: This patient iscapable of making decisions on hisown behalf. 6. Skin/Wound Care: Routine skin checks 7. Fluids/Electrolytes/Nutrition: Routine I&O's  BMP WNL on  8/25  Labs ordered for Monday 8.CAD status post CABG. Discuss resumingaspirinwith [name] 9.Acute on chronic anemia.   Hb 10.1 on 8/25  Labs ordered for Monday  Will cont to monitor 10.Hyperlipidemia. Resume Crestor 11.  Left knee effusion as well as right ankle joint pain   Uric acid WNL 8/24  ESR elevated 8/24  Xrays reviewed, showing degenerative changes in ankle and effusion in knee.  Started medrol dosepack 8/25 12. HTN  Will monitor with increased  activity  Lisinopril 5 started 8/26, increased to 10 on 8/27 13. Urinary retention: improving  Foley placed by Urology on 8/21  D/ced 8/26 due to pain and swelling  Timed voiding 14. Bilateral complex hydroceles  U/S ordered and reviewed  Pain and edema has decreased after d/cing foley   Will need follow up as outpt  LOS (Days) 3 A FACE TO FACE EVALUATION WAS PERFORMED  Chassie Pennix [name] 08/05/2016 7:41 AM

## 2016-08-06 ENCOUNTER — Inpatient Hospital Stay (HOSPITAL_COMMUNITY): Payer: Medicare Other | Admitting: Physical Therapy

## 2016-08-06 ENCOUNTER — Inpatient Hospital Stay (HOSPITAL_COMMUNITY): Payer: Medicare Other | Admitting: Occupational Therapy

## 2016-08-06 LAB — BASIC METABOLIC PANEL
ANION GAP: 11 (ref 5–15)
BUN: 34 mg/dL — AB (ref 6–20)
CALCIUM: 9.5 mg/dL (ref 8.9–10.3)
CO2: 23 mmol/L (ref 22–32)
CREATININE: 1.07 mg/dL (ref 0.61–1.24)
Chloride: 101 mmol/L (ref 101–111)
GFR calc Af Amer: >60 mL/min (ref >60)
GLUCOSE: 105 mg/dL — AB (ref 65–99)
Potassium: 4.4 mmol/L (ref 3.5–5.1)
Sodium: 135 mmol/L (ref 135–145)

## 2016-08-06 LAB — CBC WITH DIFFERENTIAL/PLATELET
BASOS PCT: 0 %
Basophils Absolute: 0 10*3/uL (ref 0.0–0.1)
EOS ABS: 0 10*3/uL (ref 0.0–0.7)
Eosinophils Relative: 0 %
HCT: 31 % — ABNORMAL LOW (ref 39.0–52.0)
HEMOGLOBIN: 10.3 g/dL — AB (ref 13.0–17.0)
LYMPHS ABS: 2.5 10*3/uL (ref 0.7–4.0)
LYMPHS PCT: 18 %
MCH: 28.9 pg (ref 26.0–34.0)
MCHC: 33.2 g/dL (ref 30.0–36.0)
MCV: 87.1 fL (ref 78.0–100.0)
Monocytes Absolute: 1 10*3/uL (ref 0.1–1.0)
Monocytes Relative: 7 %
NEUTROS ABS: 10.2 10*3/uL — AB (ref 1.7–7.7)
Neutrophils Relative %: 75 %
Platelets: 339 10*3/uL (ref 150–400)
RBC: 3.56 MIL/uL — ABNORMAL LOW (ref 4.22–5.81)
RDW: 15.1 % (ref 11.5–15.5)
WBC: 13.7 10*3/uL — ABNORMAL HIGH (ref 4.0–10.5)

## 2016-08-06 NOTE — Progress Notes (Signed)
[place] PHYSICAL MEDICINE & REHABILITATION     PROGRESS NOTE  Subjective/Complaints:  Pt laying in bed.  He states he feels much better today.    ROS:  Denies CP, SOB, N/V/D.  Objective: Vital Signs: Blood pressure (!) 153/86, pulse 68, temperature 98.2 F (36.8 C), temperature source Oral, resp. rate 18, weight 132.6 kg (292 lb 4.8 oz), SpO2 98 %. US Scrotum  Result Date: 08/05/2016 CLINICAL DATA:  RIGHT testicular pain beginning this afternoon, resolution of symptoms after catheter removal. History of prostate cancer, penile prosthesis. EXAM: SCROTAL ULTRASOUND DOPPLER ULTRASOUND OF THE TESTICLES TECHNIQUE: Complete ultrasound examination of the testicles, epididymis, and other scrotal structures was performed. Color and spectral Doppler ultrasound were also utilized to evaluate blood flow to the testicles. COMPARISON:  CT abdomen and pelvis July 28, 2016 FINDINGS: Right testicle Measurements: 4.4 x 2.1 x 2.4 cm. No mass or microlithiasis visualized. Left testicle Measurements: 4.3 x 2.2 x 1.5 cm. No mass or microlithiasis visualized. Right epididymis:  Normal in size and vascularity. Left epididymis: 7 mm anechoic cyst. Normal and size and vascularity Hydrocele:  Complex small bilateral hydroceles. Varicocele:  None visualized. Pulsed Doppler interrogation of both testes demonstrates normal low resistance arterial and venous waveforms bilaterally. Penile prosthesis identified. IMPRESSION: Small complex bilateral hydroceles.  No acute testicular process. Penile implant. Electronically Signed   By: Elon Alas [name]   On: 08/05/2016 01:11   [place] Art/ven Flow Abd Pelv Doppler  Result Date: 08/05/2016 CLINICAL DATA:  RIGHT testicular pain beginning this afternoon, resolution of symptoms after catheter removal. History of prostate cancer, penile prosthesis. EXAM: SCROTAL ULTRASOUND DOPPLER ULTRASOUND OF THE TESTICLES TECHNIQUE: Complete ultrasound examination of the testicles, epididymis, and  other scrotal structures was performed. Color and spectral Doppler ultrasound were also utilized to evaluate blood flow to the testicles. COMPARISON:  CT abdomen and pelvis July 28, 2016 FINDINGS: Right testicle Measurements: 4.4 x 2.1 x 2.4 cm. No mass or microlithiasis visualized. Left testicle Measurements: 4.3 x 2.2 x 1.5 cm. No mass or microlithiasis visualized. Right epididymis:  Normal in size and vascularity. Left epididymis: 7 mm anechoic cyst. Normal and size and vascularity Hydrocele:  Complex small bilateral hydroceles. Varicocele:  None visualized. Pulsed Doppler interrogation of both testes demonstrates normal low resistance arterial and venous waveforms bilaterally. Penile prosthesis identified. IMPRESSION: Small complex bilateral hydroceles.  No acute testicular process. Penile implant. Electronically Signed   By: Elon Alas [name]   On: 08/05/2016 01:11    Recent Labs  08/06/16 0624  WBC 13.7*  HGB 10.3*  HCT 31.0*  PLT 339    Recent Labs  08/06/16 0624  NA 135  K 4.4  CL 101  GLUCOSE 105*  BUN 34*  CREATININE 1.07  CALCIUM 9.5   CBG (last 3)  No results for input(s): GLUCAP in the last 72 hours.  Wt Readings from Last 3 Encounters:  08/06/16 132.6 kg (292 lb 4.8 oz)  08/02/16 129.8 kg (286 lb 3.2 oz)  09/16/15 (!) 140.6 kg (310 lb)    Physical Exam:  BP (!) 153/86 (BP Location: Left Arm)   Pulse 68   Temp 98.2 F (36.8 C) (Oral)   Resp 18   Wt 132.6 kg (292 lb 4.8 oz)   SpO2 98%   BMI 36.53 kg/m  Constitutional: He appears well-developed. Obese. Vital signs reviewed.  HENT: Normocephalicand atraumatic.  Eyes: Conjunctivaeand EOMare normal.  Cardiovascular: Normal rateand regular rhythm.  Respiratory: Effort normaland breath sounds normal. No respiratory distress.  GI: Soft. He exhibits no distension. Musculoskeletal: LE tenderness (improving). Left knee effusion with tenderness, no erythema Right ankle pain with ROM but no tenderness or  erythema No scrotal edema Neurological: He is alert.  Follows simple commands. Motor: B/l UE:5/5 proximal to distal RLE: Hip flexion 3+/5, knee extension 4-/5, ankle dorsi/plantar flexion 4+/5 LLE: Hip flexion 2+/5, knee extension 3+/5, ankle dorsi/plantar flexion 4/5 Skin: Skin is warmand dry.  Venous stasis changes b/l LE, R>L Psychiatric: He has a normal mood and affect. His behavior is normal   Assessment/Plan: 1. Functional deficits secondary to cystitis/hydronephrosis which require 3+ hours per day of interdisciplinary therapy in a comprehensive inpatient rehab setting. Physiatrist is providing close team supervision and 24 hour management of active medical problems listed below. Physiatrist and rehab team continue to assess barriers to discharge/monitor patient progress toward functional and medical goals.  Function:  Bathing Bathing position   Position: Other (comment) (sitting in stedy)  Bathing parts Body parts bathed by patient: Right arm, Left arm, Chest Body parts bathed by helper: Buttocks, Left upper leg, Right lower leg, Left lower leg, Back  Bathing assist Assist Level: Supervision or verbal cues      Upper Body Dressing/Undressing Upper body dressing   What is the patient wearing?: Hospital gown                Upper body assist Assist Level: Touching or steadying assistance(Pt > 75%)      Lower Body Dressing/Undressing Lower body dressing   What is the patient wearing?: Hospital Gown, Non-skid slipper socks           Non-skid slipper socks- Performed by helper: Don/doff right sock, Don/doff left sock                  Lower body assist Assist for lower body dressing: 2 Helpers      Toileting Toileting          Toileting assist     Transfers Chair/bed transfer   Chair/bed transfer method: Stand pivot Chair/bed transfer assist level: Maximal assist (Pt 25 - 49%/lift and lower) Chair/bed transfer assistive device: Mechanical  lift Mechanical lift: Stedy   Locomotion Ambulation Ambulation activity did not occur: Safety/medical Editor, commissioning activity did not occur: N/A (New pt, will be evaluated by PT in AM) Type: Manual Max wheelchair distance: 145f  Assist Level: Touching or steadying assistance (Pt > 75%)  Cognition Comprehension Comprehension assist level: Follows complex conversation/direction with no assist  Expression Expression assist level: Expresses complex ideas: With no assist  Social Interaction Social Interaction assist level: Interacts appropriately with others - No medications needed.  Problem Solving Problem solving assist level: Solves complex problems: Recognizes & self-corrects  Memory Memory assist level: Complete Independence: No helper     Medical Problem List and Plan: 1. Debilitationsecondary to cystitis/hydronephrosis  Cont CIR 2. DVT Prophylaxis/Anticoagulation: Subcutaneous heparin. Monitor platelet counts and any signs of bleeding 3. Pain Management: Ultram as needed 4. UTI/Aerococcus. Amoxicillin, until 8/30 5. Neuropsych: This patient iscapable of making decisions on hisown behalf. 6. Skin/Wound Care: Routine skin checks 7. Fluids/Electrolytes/Nutrition: Routine I&O's  BMP within acceptable range on 8/28  Will encourage fluids 8.CAD status post CABG. Discuss resumingaspirinwith Dr. HTerrence Dupont9.Acute on chronic anemia.   Hb 10.3 on 8/28  Will cont to monitor 10.Hyperlipidemia. Resume Crestor 11.  Left knee effusion as well as right ankle joint pain: Improving   Uric acid WNL 8/24  ESR elevated 8/24  Xrays reviewed, showing degenerative changes in ankle and effusion in knee.  Started medrol dosepack 8/25 12. HTN  Will monitor with increased activity  Lisinopril 5 started 8/26, increased to 10 on 8/27  Will consider further increase tomorrow 13. Urinary retention: improving  Foley placed by Urology on 8/21  D/ced 8/26 due to pain  and swelling - improved  Timed voiding 14. Bilateral complex hydroceles  U/S ordered and reviewed  Pain and edema has decreased after d/cing foley   Will need follow up as outpt 15. Leukocytosis  Likely steroid induced  Afebrile  Will cont to monitor  LOS (Days) 4 A FACE TO FACE EVALUATION WAS PERFORMED  [name] 08/06/2016 9:31 AM

## 2016-08-06 NOTE — Progress Notes (Signed)
Physical Therapy Session Note  Patient Details  Name: [name] MRN: [id number] Date of Birth: [birth year]  Today's Date: 08/06/2016 PT Individual Time: 1100-1200 and 1530-1600 PT Individual Time Calculation (min): 60 min and 30 min   Short Term Goals: Week 1:  PT Short Term Goal 1 (Week 1): patient will perform sit<>stand with mod A and RW.  PT Short Term Goal 2 (Week 1): patient will performed stand pivot/squat pivot with max A and LRAD  PT Short Term Goal 3 (Week 1): Patient will initiate gait training with LRAD.  PT Short Term Goal 4 (Week 1): Patient will performed bed mobility with min A   Skilled Therapeutic Interventions/Progress Updates:    Treatment 1: Pt received in w/c & agreeable to PT, noting 8/10 LLE pain but premedicated. RN notified of pt's c/o pain in L knee and distal to knee (posterior leg>anterior). Pt with LLE edema and c/o pain when calf squeezed (L>R) & RN made aware. Pt transferred sit<>stand with mod A and significantly extra time to complete transfer; pt able to tolerate standing ~30 seconds & Min/Mod A to allow PT to place cushion in w/c. Pt requested to use restroom & transferred sit<>stand with Mod A +1 then transported into restroom via [name] with +2 assist to transfer lift over bathroom incline. Pt able to perform remainder of transfers with stedy lift with 1 person assist. Pt with continent void on elevated toilet and tolerated standing on stedy to allow therapist to perform perineal hygiene. Pt reports his wife has been assisting him with perineal hygiene beginning right before hospital admission. Discussed pt's care and challenging himself in therapy in safe manner to increase independence with functional mobility/tasks. Pt tolerated sitting on Stedy Lift to perform hand hygiene at sink with supervision for sitting balance. Returned pt to w/c and transported him to gym via w/c total A for time management. Utilized cybex kinetron in sitting, 70-80 cm/sec,  for BLE strengthening. At end of session pt left sitting in w/c in room with BLE elevated, all needs within reach, & set up with lunch tray. Pt significantly limited by pain in BLE (L>R) and fatigue.   Treatment 2: Pt received in w/c noting 7/10 bladder & LLE pain but agreeable to tx. Pt noted significant fatigue following earlier therapy sessions. Pt's wife arrived with razor & pt requested to shave. Pt transferred sit>stand from w/c>Stedy with Mod A & increased time for movement. Pt tolerated sitting in Stedy x 15 minutes with supervision for sitting balance while reaching for objects and shaving at sink. Pt transferred to bed via [name] & completed sit<>stand in [name] with Min A. Pt transferred sitting EOB>supine with supervision, cuing, and increased time to transfer LLE onto bed. Pt able to roll sidelying>supine and scoot to head of bed with cuing for technique, bed rails, and trendelenburg positioning. Pt rolled L<>R with supervision and bed rails for pillow placement. At end of session pt left in bed with all needs within reach & wife present to supervise.   Therapy Documentation Precautions:  Precautions Precautions: Fall Precaution Comments: L knee/leg pain Restrictions Weight Bearing Restrictions: No   See Function Navigator for Current Functional Status.   Therapy/Group: Individual Therapy  [name] 08/06/2016, 12:24 PM

## 2016-08-06 NOTE — Progress Notes (Signed)
Occupational Therapy Session Note  Patient Details  Name: [name] MRN: [id number] Date of Birth: 1931/01/01  Today's Date: 08/06/2016 OT Individual Time: 0902-1000 OT Individual Time Calculation (min): 58 min   Session 2: OT Individual Time OT Individual Time Calculation (min): 56 min  Short Term Goals: Week 1:  OT Short Term Goal 1 (Week 1): Pt will be able to use a slide board to a heavy duty drop arm BSC with mod A. OT Short Term Goal 2 (Week 1): Pt will be able to tolerate have B knees in flexion at EOB to prepare for LB dressing. OT Short Term Goal 3 (Week 1): Pt will be able to sit to stand with +2 from EOB with mod A to prepare for LB dressing.  Skilled Therapeutic Interventions/Progress Updates:     Session 1 Pt seen for skilled OT focused on ADL adaptations, standing activity tolerance, and general strengthening. Pt in w/c up OT arrival. Pt propelled w/c to the sink and completed light sink bath with Mod A for LB bathing and cues to maintain forward posture to reach objects at the sink. Pt then required Max A for LB dressing and min A to pull shirt down over trunk. He came to standing 2x with Max A, RW , and Instructional cues for technique. Pt tolerated standing for 1 minute while brief and pants were  pulled up w/ max A. TED hose and socks donned dependently. Pt left seated in w/c at end of session with needs met and call bell within reach.   Session 2 Pt seen for second skilled OT session  addressing endurance, LB/UB/core strengthening, standing tolerance, and standing balance.  Pt propelled w/c in hallway with Max instruction and demonstration cues to maintain upright and forward posture to propel w/c. Pt with poor core strength and endurance, tolerating correct posture for ~ 3 mins. Pt completed 2 sit<>stands in therapy gym using RW and max A, then was able to stand for ~2 minutes at a time w/ Mod A and remove 1 UE from RW at a time to reach within base of support. OT  then worked on core strengthening utilizing dynamic reaching activities. Pt then completed 20 minutes of UE exercise using arm-bike with two, 3 minute rest breaks, and 50% questioning cues to engage core and maintain upright posture. Pt returned to room at end of session and left in w/c with needs met.   Therapy Documentation Precautions:  Precautions Precautions: Fall Precaution Comments: L knee/leg pain Restrictions Weight Bearing Restrictions: No Pain: Pain Assessment Pain Assessment: 0-10 Pain Score: 5  Pain Location: Leg (knee & leg distal to knee) Pain Orientation: Left Pain Descriptors / Indicators: Aching Pain Intervention(s): Repositioned ADL: ADL ADL Comments: refer to functional navigator See Function Navigator for Current Functional Status.   Therapy/Group: Individual Therapy  Valma Cava 08/06/2016, 1:45 PM

## 2016-08-07 ENCOUNTER — Inpatient Hospital Stay (HOSPITAL_COMMUNITY): Payer: Medicare Other | Admitting: Occupational Therapy

## 2016-08-07 ENCOUNTER — Inpatient Hospital Stay (HOSPITAL_COMMUNITY): Payer: Medicare Other | Admitting: Physical Therapy

## 2016-08-07 DIAGNOSIS — R32 Unspecified urinary incontinence: Secondary | ICD-10-CM

## 2016-08-07 NOTE — Plan of Care (Signed)
Problem: RH Bed Mobility Goal: LTG Patient will perform bed mobility with assist (PT) LTG: Patient will perform bed mobility with assistance, with/without cues (PT).  Traditional bed; upgrade 2/2 progress  Problem: RH Bed to Chair Transfers Goal: LTG Patient will perform bed/chair transfers w/assist (PT) LTG: Patient will perform bed/chair transfers with assistance, with/without cues (PT).  With LRAD; upgrade 2/2 progress  Problem: RH Car Transfers Goal: LTG Patient will perform car transfers with assist (PT) LTG: Patient will perform car transfers with assistance (PT).  With LRAD; upgrade 2/2 progress  Problem: RH Ambulation Goal: LTG Patient will ambulate in controlled environment (PT) LTG: Patient will ambulate in a controlled environment, # of feet with assistance (PT).  100 ft with LRAD; upgrade 2/2 progress Goal: LTG Patient will ambulate in home environment (PT) LTG: Patient will ambulate in home environment, # of feet with assistance (PT).  3 ft with LRAD; upgrade 2/2 progress  Problem: RH Stairs Goal: LTG Patient will ambulate up and down stairs w/assist (PT) LTG: Patient will ambulate up and down # of stairs with assistance (PT)  3 inch step with LRAD to access home; upgrade 2/2 progress

## 2016-08-07 NOTE — Progress Notes (Signed)
Occupational Therapy Session Note  Patient Details  Name: [name] MRN: [id number] Date of Birth: Aug 18, 1931  Today's Date: 08/07/2016 OT Individual Time: 1000-1100 OT Individual Time Calculation (min): 60 min     Short Term Goals: Week 1:  OT Short Term Goal 1 (Week 1): Pt will be able to use a slide board to a heavy duty drop arm BSC with mod A. OT Short Term Goal 2 (Week 1): Pt will be able to tolerate have B knees in flexion at EOB to prepare for LB dressing. OT Short Term Goal 3 (Week 1): Pt will be able to sit to stand with +2 from EOB with mod A to prepare for LB dressing.  Skilled Therapeutic Interventions/Progress Updates:   1:1 self care retraining at shower level with focus on sit to stands, standing balance and transfer training. Pt able to perform stand pivot transfer with RW from w/c to regular toilet with mod A (lifting A), but able to come off toilet with steadying A. Pt required total A for toileting due to decr standing balance without UE support. Pt able to ambulate small distance to shower and transition into zero entry shower stall with steadying A and sit on shower chair.  Instructional cues and encouragement ot bathe LEs with long handled sponge.  Pt donned boxers over biref today to help secure brief and was able to don; dressing left LE first. Pt performed sit to stands from w/c pushing up on bilateral arm rests with close supervision. Pt transferred stand pivot into recliner with min A and rested in recliner with w/c cushion on it.   Therapy Documentation Precautions:  Precautions Precautions: Fall Precaution Comments: L knee/leg pain Restrictions Weight Bearing Restrictions: No Pain: 7/10 in left lower LE but able to continue with session.  Pt had just received meds prior to session. ADL: ADL ADL Comments: refer to functional navigator Exercises:   Other Treatments:    See Function Navigator for Current Functional Status.   Therapy/Group:  Individual Therapy  Willeen Cass [facility] 08/07/2016, 1:19 PM

## 2016-08-07 NOTE — Progress Notes (Signed)
[place] PHYSICAL MEDICINE & REHABILITATION     PROGRESS NOTE  Subjective/Complaints:  Pt seen laying in bed this AM.  He continues to have urinary incontinence.    ROS:  Denies CP, SOB, N/V/D.  Objective: Vital Signs: Blood pressure 135/76, pulse 66, temperature 98.7 F (37.1 C), temperature source Oral, resp. rate 18, weight 132.2 kg (291 lb 8 oz), SpO2 98 %. No results found.  Recent Labs  08/06/16 0624  WBC 13.7*  HGB 10.3*  HCT 31.0*  PLT 339    Recent Labs  08/06/16 0624  NA 135  K 4.4  CL 101  GLUCOSE 105*  BUN 34*  CREATININE 1.07  CALCIUM 9.5   CBG (last 3)  No results for input(s): GLUCAP in the last 72 hours.  Wt Readings from Last 3 Encounters:  08/07/16 132.2 kg (291 lb 8 oz)  08/02/16 129.8 kg (286 lb 3.2 oz)  09/16/15 (!) 140.6 kg (310 lb)    Physical Exam:  BP 135/76 (BP Location: Left Arm)   Pulse 66   Temp 98.7 F (37.1 C) (Oral)   Resp 18   Wt 132.2 kg (291 lb 8 oz)   SpO2 98%   BMI 36.44 kg/m  Constitutional: He appears well-developed. Obese. Vital signs reviewed.  HENT: Normocephalicand atraumatic.  Eyes: Conjunctivaeand EOMare normal.  Cardiovascular: Normal rateand regular rhythm.  Respiratory: Effort normaland breath sounds normal. No respiratory distress.  GI: Soft. He exhibits no distension. Musculoskeletal: No tenderness, no edema. Left knee effusion with tenderness, no erythema Right ankle pain with ROM but no tenderness or erythema No scrotal edema Neurological: He is alert.  Follows simple commands. Motor: B/l UE: 5/5 proximal to distal RLE: Hip flexion 3+/5, knee extension 4-/5, ankle dorsi/plantar flexion 4+/5 LLE: Hip flexion 2+/5, knee extension 3+/5, ankle dorsi/plantar flexion 4/5 Skin: Skin is warmand dry.  Venous stasis changes b/l LE, R>L Psychiatric: He has a normal mood and affect. His behavior is normal  Assessment/Plan: 1. Functional deficits secondary to cystitis/hydronephrosis which  require 3+ hours per day of interdisciplinary therapy in a comprehensive inpatient rehab setting. Physiatrist is providing close team supervision and 24 hour management of active medical problems listed below. Physiatrist and rehab team continue to assess barriers to discharge/monitor patient progress toward functional and medical goals.  Function:  Bathing Bathing position   Position: Wheelchair/chair at sink  Bathing parts Body parts bathed by patient: Right arm, Left arm, Chest, Abdomen, Right upper leg, Left upper leg Body parts bathed by helper: Right lower leg, Left lower leg  Bathing assist Assist Level: Touching or steadying assistance(Pt > 75%)      Upper Body Dressing/Undressing Upper body dressing   What is the patient wearing?: Pull over shirt/dress     Pull over shirt/dress - Perfomed by patient: Thread/unthread right sleeve, Thread/unthread left sleeve, Put head through opening Pull over shirt/dress - Perfomed by helper: Pull shirt over trunk        Upper body assist Assist Level: Touching or steadying assistance(Pt > 75%)      Lower Body Dressing/Undressing Lower body dressing   What is the patient wearing?: Pants, Non-skid slipper socks, Ted Hose       Pants- Performed by helper: Thread/unthread right pants leg, Thread/unthread left pants leg, Pull pants up/down   Non-skid slipper socks- Performed by helper: Don/doff right sock, Don/doff left sock               TED Hose - Performed by helper: Don/doff left  TED hose, Don/doff right TED hose  Lower body assist Assist for lower body dressing: Touching or steadying assistance (Pt > 75%) (Max A)      Toileting Toileting     Toileting steps completed by helper: Adjust clothing prior to toileting, Performs perineal hygiene, Adjust clothing after toileting Toileting Assistive Devices: Grab bar or rail, Other (comment) (stedy)  Toileting assist     Transfers Chair/bed transfer   Chair/bed transfer  method: Stand pivot Chair/bed transfer assist level: Maximal assist (Pt 25 - 49%/lift and lower) Chair/bed transfer assistive device: Mechanical lift Mechanical lift: Stedy   Locomotion Ambulation Ambulation activity did not occur: Safety/medical Editor, commissioning activity did not occur:  (New pt, will be evaluated by PT in AM) Type: Manual Max wheelchair distance: 132f  Assist Level: Touching or steadying assistance (Pt > 75%)  Cognition Comprehension Comprehension assist level: Follows complex conversation/direction with no assist  Expression Expression assist level: Expresses complex ideas: With no assist  Social Interaction Social Interaction assist level: Interacts appropriately with others - No medications needed.  Problem Solving Problem solving assist level: Solves complex problems: Recognizes & self-corrects  Memory Memory assist level: Complete Independence: No helper     Medical Problem List and Plan: 1. Debilitationsecondary to cystitis/hydronephrosis  Cont CIR 2. DVT Prophylaxis/Anticoagulation: Subcutaneous heparin. Monitor platelet counts and any signs of bleeding 3. Pain Management: Ultram as needed 4. UTI/Aerococcus. Amoxicillin, until 8/30 5. Neuropsych: This patient iscapable of making decisions on hisown behalf. 6. Skin/Wound Care: Routine skin checks 7. Fluids/Electrolytes/Nutrition: Routine I&O's  BMP within acceptable range on 8/28  Will encourage fluids 8.CAD status post CABG. Discuss resumingaspirinwith Dr. HTerrence Dupont9.Acute on chronic anemia.   Hb 10.3 on 8/28  Will cont to monitor 10.Hyperlipidemia. Resume Crestor 11.  Left knee effusion as well as right ankle joint pain: Improving   Uric acid WNL 8/24  ESR elevated 8/24  Xrays reviewed, showing degenerative changes in ankle and effusion in knee.  Medrol dosepack 8/25-8/31 12. HTN  Will monitor with increased activity  Lisinopril 5 started 8/26, increased to 10  on 8/27 13. Urinary retention, now with incontinence  Foley placed by Urology on 8/21  D/ced 8/26 due to pain and swelling  Timed voiding   Will check PVRs 14. Bilateral complex hydroceles  U/S ordered and reviewed  Pain and edema has decreased after d/cing foley   Will need follow up as outpt 15. Leukocytosis  Likely steroid induced  Afebrile  Will cont to monitor  LOS (Days) 5 A FACE TO FACE EVALUATION WAS PERFORMED  Srinivas Lippman ALorie Phenix8/29/2017 8:48 AM

## 2016-08-07 NOTE — Progress Notes (Addendum)
Occupational Therapy Session Note  Patient Details  Name: [name] MRN: [id number] Date of Birth: Aug 07, 1931  Today's Date: 08/07/2016 OT Individual Time: 1415-1455 OT Individual Time Calculation (min): 40 min    Short Term Goals: Week 1:  OT Short Term Goal 1 (Week 1): Pt will be able to use a slide board to a heavy duty drop arm BSC with mod A. OT Short Term Goal 2 (Week 1): Pt will be able to tolerate have B knees in flexion at EOB to prepare for LB dressing. OT Short Term Goal 3 (Week 1): Pt will be able to sit to stand with +2 from EOB with mod A to prepare for LB dressing.  Skilled Therapeutic Interventions/Progress Updates:   Pt seen for skilled OT treatment today addressing bladder and clothing management, standing balance, and activity tolerance. Pt seated in recliner upon OT and rehab tech arrival. Pt reports frustration with incontinence and managing briefs 2/2 uncontrolled urination. Pt refuses to wear pants to help keep up brief during standing activities and requests a condom cath be placed. OT explained the issues with condom cath and suggested other methods including wearing mesh underwear over briefs. After much discussion, pt agreeable to try OT suggestion. Pt unable to thread LE's through mesh underwear, so OT instructed pt on use of reacher to assist with LB dressing task. Pt got angry with OT for suggesting AE for independence with dressing and said "my wife will do it." Pt able to grasp mesh underwear from right below the knee and pull them up to upper thigh in sitting. Pt then stood with Min A and new brief was dependently donned. Pt able to pull mesh underwear above hips with Min A removing one hand from the RW at a time. Pt then ambulated to the sink with Min A and completed grooming standing at the sink for ~1 minute. He required Min A for dynamic standing balance reaching for objects outside base of support. Chair brought behind and had to sit 2/2 fatigue. Pt left  seated in recliner at end of session with needs met.  Therapy Documentation Precautions:  Precautions Precautions: Fall Precaution Comments: L knee/leg pain Restrictions Weight Bearing Restrictions: No Pain: Pain Assessment Pain Assessment: No/denies pain  See Function Navigator for Current Functional Status.   Therapy/Group: Individual Therapy  Valma Cava 08/07/2016, 3:02 PM

## 2016-08-07 NOTE — Progress Notes (Signed)
Physical Therapy Session Note  Patient Details  Name: [name] MRN: [id number] Date of Birth: [birth year]  Today's Date: 08/07/2016 PT Individual Time: 0916-1003 and 1616-1700 PT Individual Time Calculation (min): 47 min and 44 min   Short Term Goals: Week 1:  PT Short Term Goal 1 (Week 1): patient will perform sit<>stand with mod A and RW.  PT Short Term Goal 2 (Week 1): patient will performed stand pivot/squat pivot with max A and LRAD  PT Short Term Goal 3 (Week 1): Patient will initiate gait training with LRAD.  PT Short Term Goal 4 (Week 1): Patient will performed bed mobility with min A   Skilled Therapeutic Interventions/Progress Updates:    Treatment 1: Pt received in bed & agreeable to PT, noting 7/10 LLE pain & RN made aware. Pt transferred supine>sitting EOB with supervision, HOB elevated and bed rails. Pt transferred sit<>stand with min A fading to supervision (for sit>stand portion) as session progressed. Pt tolerated standing x 2 minutes with RW & min A to allow RN to remove catheter & for donning brief total A. Session focused on gait & endurance training. Pt ambulated 27 ft + 22 ft with +2 Assist (min A + w/c follow for safety) with RW and cuing for forward gaze. Pt propelled w/c x 200 ft back to room with supervision. At end of session pt left sitting in w/c with all needs within reach & BLE elevated.   Treatment 2: Pt received in recliner & agreeable to PT, noting 6/10 LLE pain but reports he cannot receive pain medication yet. Provided therapeutic listening regarding pt's treatment sessions and educated pt on purpose of therapies while in CIR. Pt transferred recliner>w/c with RW & Min A via stand pivot and cuing to feel seat on back of legs before transferring to sitting. Transported pt to gym via w/c total A for time management. Pt completed stand pivot w/c>nu-step with RW & Min A. Utilized nu-step on Level 1 x 11 minutes for endurance & strength training; pt reported 13  on Borg RPE scale throughout activity. Pt returned to w/c and was transported to room in same manner as noted above. At end of session pt left sitting in w/c with BLE elevated, all needs within reach, and set up with dinner tray.  Therapy Documentation Precautions:  Precautions Precautions: Fall Precaution Comments: L knee/leg pain Restrictions Weight Bearing Restrictions: No   See Function Navigator for Current Functional Status.   Therapy/Group: Individual Therapy  [name] 08/07/2016, 12:30 PM

## 2016-08-08 ENCOUNTER — Inpatient Hospital Stay (HOSPITAL_COMMUNITY): Payer: Medicare Other | Admitting: Physical Therapy

## 2016-08-08 ENCOUNTER — Inpatient Hospital Stay (HOSPITAL_COMMUNITY): Payer: Medicare Other | Admitting: Occupational Therapy

## 2016-08-08 ENCOUNTER — Ambulatory Visit: Payer: Medicare Other | Admitting: Podiatry

## 2016-08-08 DIAGNOSIS — D72829 Elevated white blood cell count, unspecified: Secondary | ICD-10-CM

## 2016-08-08 LAB — URINE MICROSCOPIC-ADD ON

## 2016-08-08 LAB — URINALYSIS, ROUTINE W REFLEX MICROSCOPIC
Bilirubin Urine: NEGATIVE
GLUCOSE, UA: NEGATIVE mg/dL
Ketones, ur: NEGATIVE mg/dL
Nitrite: NEGATIVE
PH: 6 (ref 5.0–8.0)
Protein, ur: 30 mg/dL — AB
Specific Gravity, Urine: 1.016 (ref 1.005–1.030)

## 2016-08-08 NOTE — Progress Notes (Signed)
Social Work [name], LCSW Social Worker Signed   Patient Care Conference Date of Service: 08/08/2016  2:05 PM      Hide copied text Hover for attribution information Inpatient RehabilitationTeam Conference and Plan of Care Update Date: 08/08/2016   Time: 11:25 AM      Patient Name: [name]      Medical Record Number: [id number]  Date of Birth: [birth year] Sex: Male         Room/Bed: 4W03C/4W03C-01 Payor Info: Payor: MEDICARE / Plan: MEDICARE PART A AND B / Product Type: *No Product type* /     Admitting Diagnosis: Debility  Admit Date/Time:  08/02/2016  9:15 PM Admission Comments: No comment available    Primary Diagnosis:  <principal problem not specified> Principal Problem: <principal problem not specified>       Patient Active Problem List    Diagnosis Date Noted  . Leukocytosis    . Absence of bladder continence    . Pain    . Hydrocele in adult    . Acute cystitis with hematuria    . Acute blood loss anemia    . Anemia of chronic disease    . Other secondary hypertension    . Acute idiopathic gout of left knee    . Urinary retention    . Debilitated 08/02/2016  . Bilateral hydronephrosis    . Normochromic normocytic anemia    . E-coli UTI    . Benign essential HTN    . Coronary artery disease involving coronary bypass graft of native heart without angina pectoris    . BPH (benign prostatic hyperplasia)    . Kidney stones    . CKD (chronic kidney disease)    . Morbid obesity due to excess calories ([place])    . Acute renal failure (ARF) ([place]) 07/29/2016      Expected Discharge Date: Expected Discharge Date: 08/19/16   Team Members Present: Physician leading conference: Dr. Delice Lesch Social Worker Present: Ovidio Kin, LCSW Nurse Present: Heather Roberts, RN PT Present: Barrie Folk, PT;Victoria Sabra Heck, PT OT Present: Willeen Cass, OT;Other (comment) Grayland Ormond Doe-OT) SLP Present: Gunnar Fusi, SLP PPS Coordinator present : Daiva Nakayama, RN,  CRRN       Current Status/Progress Goal Weekly Team Focus  Medical   Debilitation secondary to cystitis/hydronephrosis with poor endrance  Improve safety, endurance, bladder symptoms  See above   Bowel/Bladder   constant dribbling of urine- condom cath @ night, continent of bowel, last bm 8/28  better urine control, regular bms  monitor   Swallow/Nutrition/ Hydration             ADL's   Max A LB dressing, Min A stand, Mod A bathing, Max A toileting  Min A LB dressing, Min A toileting, Min A standing balance, Min A transfers  Activity tolerance, standing balance, LB dressing, functional transfers   Mobility   supervision bed mobility with hospital bed features, min A for stand pivot transfers with RW, ambulates a maximum of 27 ft with Min A + w/c follow for safety, continues to be limited by LLE pain  goals upgraded to supervision for ambulation with LRAD, supervision for transfers, mod I for bed mobility from traditional bed  endurance training, gait training, bed mobility & transfer training, pt education   Communication             Safety/Cognition/ Behavioral Observations           Pain   tramadol q6 prn adequate  little to no pain  assess & offer pain meds   Skin   intact  remain free of breakdown or infection  monitor       *See Care Plan and progress notes for long and short-term goals.   Barriers to Discharge: Anemia, HTN, Gout, Urinary incontinence, leukocytosis, hydroceles   Possible Resolutions to Barriers:  Follow labs, finishing medrol dose pack, therapies, urology as oupt, optimize BP meds   Discharge Planning/Teaching Needs:  Home with wife who can assist with his care, he wants to be as independent as psosible before leaving here.      Team Discussion:  Goals supervision level. Endurance and activity tolerance working on in therapies. Checking UA today and may need Urol consult before leaves CIR. BP issues today. Pain in calf MD addressing. Pt does not tend to push  himself in therapies  Revisions to Treatment Plan:  Upgraded goals to supervision level    Continued Need for Acute Rehabilitation Level of Care: The patient requires daily medical management by a physician with specialized training in physical medicine and rehabilitation for the following conditions: Daily direction of a multidisciplinary physical rehabilitation program to ensure safe treatment while eliciting the highest outcome that is of practical value to the patient.: Yes Daily medical management of patient stability for increased activity during participation in an intensive rehabilitation regime.: Yes Daily analysis of laboratory values and/or radiology reports with any subsequent need for medication adjustment of medical intervention for : Blood pressure problems;Renal problems;Urological problems   [name] 08/08/2016, 2:05 PM       Patient ID: [name], male   DOB: 1931/06/25, 82 y.o.   MRN: [id number]

## 2016-08-08 NOTE — Progress Notes (Signed)
Patient complaining of feeling weak and dizzy after sitting up in wheel chair "asleep" . VS B/P 87/55 HR 88 temp 98.7 o2 sat at 100%  RA. Patient assist stand pivot from wheelchair to bed. B/P 115/95 HR 105. Patient reports he is "worn out:" . Patient noted with raised rash to right side of neck and shoulder. D. Angiuill, PA notified . Orders received. Continue with plan of care.  Mliss Sax

## 2016-08-08 NOTE — Progress Notes (Signed)
[name]     PROGRESS NOTE  Subjective/Complaints:  Pt sleeping in bed this AM.  He is easily arousable.  He slept well overnight and feels like he is making progress.    ROS:  Denies CP, SOB, N/V/D.  Objective: Vital Signs: Blood pressure 110/68, pulse 70, temperature 98.7 F (37.1 C), temperature source Oral, resp. rate 18, weight 129.8 kg (286 lb 1.6 oz), SpO2 97 %. No results found.  Recent Labs  08/06/16 0624  WBC 13.7*  HGB 10.3*  HCT 31.0*  PLT 339    Recent Labs  08/06/16 0624  NA 135  K 4.4  CL 101  GLUCOSE 105*  BUN 34*  CREATININE 1.07  CALCIUM 9.5   CBG (last 3)  No results for input(s): GLUCAP in the last 72 hours.  Wt Readings from Last 3 Encounters:  08/08/16 129.8 kg (286 lb 1.6 oz)  08/02/16 129.8 kg (286 lb 3.2 oz)  09/16/15 (!) 140.6 kg (310 lb)    Physical Exam:  BP 110/68   Pulse 70   Temp 98.7 F (37.1 C) (Oral)   Resp 18   Wt 129.8 kg (286 lb 1.6 oz)   SpO2 97%   BMI 35.76 kg/m  Constitutional: He appears well-developed. Obese. Vital signs reviewed.  HENT: Normocephalicand atraumatic.  Eyes: Conjunctivaeand EOMare normal.  Cardiovascular: Normal rateand regular rhythm.  Respiratory: Effort normaland breath sounds normal. No respiratory distress.  GI: Soft. He exhibits no distension. Musculoskeletal: No tenderness, no edema. Left knee effusion with tenderness, no erythema Right ankle pain with ROM but no tenderness or erythema No scrotal edema Neurological: He is alert.  Follows simple commands. Motor: B/l UE: 5/5 proximal to distal RLE: Hip flexion 3+/5, knee extension 4-/5, ankle dorsi/plantar flexion 4+/5 (unchanged) LLE: Hip flexion 2+/5, knee extension 3+/5, ankle dorsi/plantar flexion 4/5(unchanged) Skin: Skin is warmand dry.  Venous stasis changes b/l LE, R>L Psychiatric: He has a normal mood and affect. His behavior is normal  Assessment/Plan: 1. Functional deficits  secondary to cystitis/hydronephrosis which require 3+ hours per day of interdisciplinary therapy in a comprehensive inpatient rehab setting. Physiatrist is providing close team supervision and 24 hour management of active medical problems listed below. Physiatrist and rehab team continue to assess barriers to discharge/monitor patient progress toward functional and medical goals.  Function:  Bathing Bathing position   Position: Wheelchair/chair at sink  Bathing parts Body parts bathed by patient: Right arm, Left arm, Chest, Abdomen, Right upper leg, Left upper leg Body parts bathed by helper: Right lower leg, Left lower leg  Bathing assist Assist Level: Touching or steadying assistance(Pt > 75%)      Upper Body Dressing/Undressing Upper body dressing   What is the patient wearing?: Pull over shirt/dress     Pull over shirt/dress - Perfomed by patient: Thread/unthread right sleeve, Thread/unthread left sleeve, Put head through opening Pull over shirt/dress - Perfomed by helper: Pull shirt over trunk        Upper body assist Assist Level: Touching or steadying assistance(Pt > 75%)      Lower Body Dressing/Undressing Lower body dressing   What is the patient wearing?: Underwear (mesh-underwear) Underwear - Performed by patient: Pull underwear up/down Underwear - Performed by helper: Thread/unthread left underwear leg, Thread/unthread right underwear leg, Pull underwear up/down   Pants- Performed by helper: Thread/unthread right pants leg, Thread/unthread left pants leg, Pull pants up/down   Non-skid slipper socks- Performed by helper: Don/doff right sock, Don/doff left sock  TED Hose - Performed by helper: Don/doff left TED hose, Don/doff right TED hose  Lower body assist Assist for lower body dressing: Touching or steadying assistance (Pt > 75%)      Toileting Toileting     Toileting steps completed by helper: Adjust clothing prior to toileting, Performs  perineal hygiene, Adjust clothing after toileting Toileting Assistive Devices: Grab bar or rail, Other (comment) (stedy)  Toileting assist     Transfers Chair/bed transfer   Chair/bed transfer method: Stand pivot Chair/bed transfer assist level: Touching or steadying assistance (Pt > 75%) Chair/bed transfer assistive device: Environmental manager lift: Ecologist Ambulation activity did not occur: Safety/medical concerns   Max distance: 27 ft Assist level: 2 helpers (Min A + w/c follow for safety)   Oceanographer activity did not occur:  (New pt, will be evaluated by PT in AM) Type: Manual Max wheelchair distance: 200 ft Assist Level: Supervision or verbal cues  Cognition Comprehension Comprehension assist level: Follows complex conversation/direction with no assist  Expression Expression assist level: Expresses complex ideas: With no assist  Social Interaction Social Interaction assist level: Interacts appropriately with others - No medications needed.  Problem Solving Problem solving assist level: Solves complex problems: Recognizes & self-corrects  Memory Memory assist level: Complete Independence: No helper     Medical Problem List and Plan: 1. Debilitationsecondary to cystitis/hydronephrosis  Cont CIR 2. DVT Prophylaxis/Anticoagulation: Subcutaneous heparin. Monitor platelet counts and any signs of bleeding 3. Pain Management: Ultram as needed 4. UTI/Aerococcus. Amoxicillin completed 8/30 5. Neuropsych: This patient iscapable of making decisions on hisown behalf. 6. Skin/Wound Care: Routine skin checks 7. Fluids/Electrolytes/Nutrition: Routine I&O's  BMP within acceptable range on 8/28  Will encourage fluids 8.CAD status post CABG. Discuss resumingaspirinwith [name] 9.Acute on chronic anemia.   Hb 10.3 on 8/28  Will cont to monitor 10.Hyperlipidemia. Resume Crestor 11.  Left knee effusion as well as right ankle joint pain:  Improving   Uric acid WNL 8/24  ESR elevated 8/24  Xrays reviewed, showing degenerative changes in ankle and effusion in knee.  Medrol dosepack 8/25-8/31 12. HTN  Will monitor with increased activity  Lisinopril 5 started 8/26, increased to 10 on 8/27  Improved control 13. Urinary retention, now with incontinence  Foley placed by Urology on 8/21  D/ced 8/26 due to pain and swelling  Timed voiding   PVRs unremarkable  UA/Ucx ordered 14. Bilateral complex hydroceles  U/S ordered and reviewed  Pain and edema has decreased after d/cing foley   Will need follow up as outpt 15. Leukocytosis  Likely steroid induced  Afebrile  Will cont to monitor  Labs ordered for tomorrow  LOS (Days) 6 A FACE TO FACE EVALUATION WAS PERFORMED  [name] [name] 08/08/2016 8:33 AM

## 2016-08-08 NOTE — Patient Care Conference (Signed)
Inpatient RehabilitationTeam Conference and Plan of Care Update Date: 08/08/2016   Time: 11:25 AM    Patient Name: [name]      Medical Record Number: [id number]  Date of Birth: January 12, 1931 Sex: Male         Room/Bed: 4W03C/4W03C-01 Payor Info: Payor: MEDICARE / Plan: MEDICARE PART A AND B / Product Type: *No Product type* /    Admitting Diagnosis: Debility  Admit Date/Time:  08/02/2016  9:15 PM Admission Comments: No comment available   Primary Diagnosis:  <principal problem not specified> Principal Problem: <principal problem not specified>  Patient Active Problem List   Diagnosis Date Noted  . Leukocytosis   . Absence of bladder continence   . Pain   . Hydrocele in adult   . Acute cystitis with hematuria   . Acute blood loss anemia   . Anemia of chronic disease   . Other secondary hypertension   . Acute idiopathic gout of left knee   . Urinary retention   . Debilitated 08/02/2016  . Bilateral hydronephrosis   . Normochromic normocytic anemia   . E-coli UTI   . Benign essential HTN   . Coronary artery disease involving coronary bypass graft of native heart without angina pectoris   . BPH (benign prostatic hyperplasia)   . Kidney stones   . CKD (chronic kidney disease)   . Morbid obesity due to excess calories ([place])   . Acute renal failure (ARF) ([place]) 07/29/2016    Expected Discharge Date: Expected Discharge Date: 08/19/16  Team Members Present: Physician leading conference: Dr. Delice Lesch Social Worker Present: Ovidio Kin, LCSW Nurse Present: Heather Roberts, RN PT Present: Barrie Folk, PT;Victoria Sabra Heck, PT OT Present: Willeen Cass, OT;Other (comment) Grayland Ormond Doe-OT) SLP Present: Gunnar Fusi, SLP PPS Coordinator present : Daiva Nakayama, RN, CRRN     Current Status/Progress Goal Weekly Team Focus  Medical   Debilitation secondary to cystitis/hydronephrosis with poor endrance  Improve safety, endurance, bladder symptoms  See above    Bowel/Bladder   constant dribbling of urine- condom cath @ night, continent of bowel, last bm 8/28  better urine control, regular bms  monitor   Swallow/Nutrition/ Hydration             ADL's   Max A LB dressing, Min A stand, Mod A bathing, Max A toileting  Min A LB dressing, Min A toileting, Min A standing balance, Min A transfers  Activity tolerance, standing balance, LB dressing, functional transfers   Mobility   supervision bed mobility with hospital bed features, min A for stand pivot transfers with RW, ambulates a maximum of 27 ft with Min A + w/c follow for safety, continues to be limited by LLE pain  goals upgraded to supervision for ambulation with LRAD, supervision for transfers, mod I for bed mobility from traditional bed  endurance training, gait training, bed mobility & transfer training, pt education   Communication             Safety/Cognition/ Behavioral Observations            Pain   tramadol q6 prn adequate  little to no pain  assess & offer pain meds   Skin   intact  remain free of breakdown or infection  monitor      *See Care Plan and progress notes for long and short-term goals.  Barriers to Discharge: Anemia, HTN, Gout, Urinary incontinence, leukocytosis, hydroceles    Possible Resolutions to Barriers:  Follow labs, finishing medrol dose  pack, therapies, urology as oupt, optimize BP meds    Discharge Planning/Teaching Needs:  Home with wife who can assist with his care, he wants to be as independent as psosible before leaving here.      Team Discussion:  Goals supervision level. Endurance and activity tolerance working on in therapies. Checking UA today and may need Urol consult before leaves CIR. BP issues today. Pain in calf MD addressing. Pt does not tend to push himself in therapies  Revisions to Treatment Plan:  Upgraded goals to supervision level   Continued Need for Acute Rehabilitation Level of Care: The patient requires daily medical  management by a physician with specialized training in physical medicine and rehabilitation for the following conditions: Daily direction of a multidisciplinary physical rehabilitation program to ensure safe treatment while eliciting the highest outcome that is of practical value to the patient.: Yes Daily medical management of patient stability for increased activity during participation in an intensive rehabilitation regime.: Yes Daily analysis of laboratory values and/or radiology reports with any subsequent need for medication adjustment of medical intervention for : Blood pressure problems;Renal problems;Urological problems  [name] 08/08/2016, 2:05 PM

## 2016-08-08 NOTE — Progress Notes (Signed)
Social Work Patient ID: [name], male   DOB: 1931-03-03, 82 y.o.   MRN: [id number]  Met with pt and wife to discuss team conference goals-supervision level and target discharge 9/10. Pt wanted to know if he could leave sooner It is all up to the team and MD. He feels he can make enough progress to leave sooner. Wife wants him to stay here as long as he needs too. Discussed will meet again next Wed will see then. Pt feels better now that he had a rest. He states: " I just needed that little break." Wife aware will need to come in for family training prior to husband's discharge. She was a CNA here so she knows, but will come when needed.

## 2016-08-08 NOTE — Progress Notes (Signed)
Occupational Therapy Session Note  Patient Details  Name: [name] MRN: [id number] Date of Birth: 01/15/1931  Today's Date: 08/08/2016 OT Individual Time: 0903-1000 OT Individual Time Calculation (min): 57 min   Short Term Goals: Week 1:  OT Short Term Goal 1 (Week 1): Pt will be able to use a slide board to a heavy duty drop arm BSC with mod A. OT Short Term Goal 2 (Week 1): Pt will be able to tolerate have B knees in flexion at EOB to prepare for LB dressing. OT Short Term Goal 3 (Week 1): Pt will be able to sit to stand with +2 from EOB with mod A to prepare for LB dressing.  Skilled Therapeutic Interventions/Progress Updates:   Pt seen for skilled OT with focus on functional mobility, standing activity tolerance, general strengthening, sit<>stands, and functional transfers. Pt ambulated in hallway with RW and w/c follow with Min guard A. Pt with 1 near LOB 2/2 L knee buckle, requiring Mod A to recover and sit in w/c. Pt able to complete sit<>stands with min guard A and tolerated standing for ~ 6 minute bouts.Pt able to maintain  standing balance with Min A when reaching outside base of support and intermittent close supervision. Pt reported trap and neck soreness; heat pack, massage, and stretching utilized; pt satisfied. Pt reported need for BM and was brought back to room. Pt ambulated into bathroom w/ supervision and transferred on/off toilet w/ close supervision. He required assist to manage clothing before and after BM and needed dependent hygiene assistance 2/2 fatigue and difficulty maintaining standing balance. Pt left seated in w.c at end of session with needs met.   Therapy Documentation Precautions:  Precautions Precautions: Fall Precaution Comments: L knee/leg pain Restrictions Weight Bearing Restrictions: No Pain Assessment Pain Assessment: No/denies pain ADL: ADL ADL Comments: refer to functional navigator  See Function Navigator for Current Functional  Status.   Therapy/Group: Individual Therapy  Elisabeth S Doe 08/08/2016, 3:42 PM  

## 2016-08-08 NOTE — Progress Notes (Signed)
Physical Therapy Session Note  Patient Details  Name: [name] MRN: [id number] Date of Birth: 1931/11/09  Today's Date: 08/08/2016 PT Individual Time: [phone] and [name] PT Individual Time Calculation (min): 61 min and 15 min   Short Term Goals: Week 1:  PT Short Term Goal 1 (Week 1): patient will perform sit<>stand with mod A and RW.  PT Short Term Goal 2 (Week 1): patient will performed stand pivot/squat pivot with max A and LRAD  PT Short Term Goal 3 (Week 1): Patient will initiate gait training with LRAD.  PT Short Term Goal 4 (Week 1): Patient will performed bed mobility with min A   Skilled Therapeutic Interventions/Progress Updates:    Treatment 1: Pt received in bed noting 6/10 bladder pain & 7/10 LLE pain but agreeable to tx. Pt transferred supine>sitting EOB with bed flat, bed rails, and supervision. Pt reports he has a bed at home in which the [facility] raises. Pt requested to take sponge bath at sink. Pt completed sit<>stand and stand pivot transfer bed>w/c with supervision/min A and RW. Set pt up at sink where he washed upper body and brushed teeth with Mod I. Therapist threaded mesh underwear on BLE max A, pt transferred sit<>stand with supervision/steady A, and maintained standing with steady A while pulling mesh underwear up over brief (as pt likes to wear both to keep brief from falling down). Pt propelled w/c x 225 ft room>gym with BUE & supervision for endurance training. Pt transferred w/c>nu-step via ambulation x 3 ft with RW & min A. Utilized nu-step (level 2 x 4 minutes, level 3 x 6 minutes) with pt reporting 13 on Borg RPE scale; purpose of activity to decrease LE stiffness, BLE strengthening and cardiovascular endurance training. During w/c propulsion and nu-step activity pt reported neck tightness and dizziness; BP = 110/71 mmHg, HR = 91 bpm, SpO2 = 99%. Pt transferred nu-step>w/c via stand pivot with RW & Min A with multiple instances of L knee buckling with pt  reporting pain. Pt transported back to room via w/c total A for time management and left in w/c with BLE elevated & all needs within reach.   Treatment 2: Pt received in bed asleep but easily awakened upon PT & rehab tech arrival. Pt had not eaten lunch and noted low BP during lunch hour; PT looked up pt's vitals in computer and informed pt that BP had dropped but increased again shortly after. Pt requested therapist speak with nurse who cleared pt to participate in therapy based on his tolerate. Therapist reassessed BP = 122/75 mmHg & HR = 80 bpm. Pt then with c/o "no energy" but stating "I know you have a job to do". Educated pt on role of therapist to assist pt with improving functional mobility and independence based on his tolerance. Pt asked "what can I do with no energy?" and therapist educated pt on supine exercises. Pt did not wish to participate in therapy at this time 2/2 fatigue. Set pt up with lunch tray in bed & encouraged pt to eat a little of lunch. Pt left in bed with all needs within reach. Will follow up with pt based on plan of care.   Treatment 3: Attempted to see pt to make up missed time from earlier session. Pt declined all therapy, even bed level exercises, despite encouragement from therapist & pt's wife. Pt missed 75 minutes of skilled PT treatment 2/2 refusal & fatigue.  Therapy Documentation Precautions:  Precautions Precautions: Fall Precaution Comments: L  knee/leg pain Restrictions Weight Bearing Restrictions: No   General: PT Amount of Missed Time (min): 75 Minutes PT Missed Treatment Reason: Patient unwilling to participate;Patient fatigue  Vital Signs: Therapy Vitals Pulse Rate: 91 BP: 110/71 Patient Position (if appropriate): Sitting   See Function Navigator for Current Functional Status.   Therapy/Group: Individual Therapy  [name] 08/08/2016, 9:51 AM

## 2016-08-09 ENCOUNTER — Inpatient Hospital Stay (HOSPITAL_COMMUNITY): Payer: Medicare Other | Admitting: Physical Therapy

## 2016-08-09 ENCOUNTER — Inpatient Hospital Stay (HOSPITAL_COMMUNITY): Payer: Medicare Other | Admitting: Occupational Therapy

## 2016-08-09 DIAGNOSIS — N390 Urinary tract infection, site not specified: Secondary | ICD-10-CM

## 2016-08-09 LAB — CBC WITH DIFFERENTIAL/PLATELET
Basophils Absolute: 0.1 10*3/uL (ref 0.0–0.1)
Basophils Relative: 1 %
EOS ABS: 0.4 10*3/uL (ref 0.0–0.7)
Eosinophils Relative: 3 %
HCT: 33.5 % — ABNORMAL LOW (ref 39.0–52.0)
HEMOGLOBIN: 10.6 g/dL — AB (ref 13.0–17.0)
LYMPHS ABS: 1.7 10*3/uL (ref 0.7–4.0)
Lymphocytes Relative: 15 %
MCH: 28 pg (ref 26.0–34.0)
MCHC: 31.6 g/dL (ref 30.0–36.0)
MCV: 88.4 fL (ref 78.0–100.0)
MONO ABS: 1.4 10*3/uL — AB (ref 0.1–1.0)
MONOS PCT: 12 %
NEUTROS PCT: 69 %
Neutro Abs: 8.1 10*3/uL — ABNORMAL HIGH (ref 1.7–7.7)
Platelets: 314 10*3/uL (ref 150–400)
RBC: 3.79 MIL/uL — ABNORMAL LOW (ref 4.22–5.81)
RDW: 15.9 % — ABNORMAL HIGH (ref 11.5–15.5)
WBC: 11.7 10*3/uL — ABNORMAL HIGH (ref 4.0–10.5)

## 2016-08-09 LAB — BASIC METABOLIC PANEL
Anion gap: 8 (ref 5–15)
BUN: 40 mg/dL — AB (ref 6–20)
CHLORIDE: 103 mmol/L (ref 101–111)
CO2: 25 mmol/L (ref 22–32)
CREATININE: 1.29 mg/dL — AB (ref 0.61–1.24)
Calcium: 9.2 mg/dL (ref 8.9–10.3)
GFR calc non Af Amer: 50 mL/min — ABNORMAL LOW (ref >60)
GFR, EST AFRICAN AMERICAN: 57 mL/min — AB (ref >60)
GLUCOSE: 91 mg/dL (ref 65–99)
Potassium: 4.6 mmol/L (ref 3.5–5.1)
Sodium: 136 mmol/L (ref 135–145)

## 2016-08-09 MED ORDER — SODIUM CHLORIDE 0.9 % IV BOLUS (SEPSIS)
1000.0000 mL | Freq: Once | INTRAVENOUS | Status: AC
  Administered 2016-08-09: 1000 mL via INTRAVENOUS

## 2016-08-09 MED ORDER — HYDROCORTISONE 1 % EX CREA
TOPICAL_CREAM | Freq: Three times a day (TID) | CUTANEOUS | Status: DC
  Administered 2016-08-09 (×2): via TOPICAL
  Filled 2016-08-09: qty 28

## 2016-08-09 MED ORDER — SODIUM CHLORIDE 0.45 % IV BOLUS: 1000.0000 mL | Freq: Once | INTRAVENOUS | Status: DC

## 2016-08-09 MED ORDER — NITROFURANTOIN MONOHYD MACRO 100 MG PO CAPS
100.0000 mg | ORAL_CAPSULE | Freq: Two times a day (BID) | ORAL | Status: DC
  Administered 2016-08-09 – 2016-08-10 (×3): 100 mg via ORAL
  Filled 2016-08-09 (×3): qty 1

## 2016-08-09 MED ORDER — BOOST / RESOURCE BREEZE PO LIQD
1.0000 | Freq: Two times a day (BID) | ORAL | Status: DC
  Administered 2016-08-10 – 2016-08-15 (×12): 1 via ORAL

## 2016-08-09 MED ORDER — ENSURE ENLIVE PO LIQD: 237.0000 mL | ORAL | Status: DC

## 2016-08-09 NOTE — Progress Notes (Signed)
[facility]     PROGRESS NOTE  Subjective/Complaints:  Pt sitting up in bed eating breakfast.  He had a good night and denies urinary symptoms.     ROS:  Denies CP, SOB, N/V/D.  Objective: Vital Signs: Blood pressure 113/70, pulse 70, temperature 99.2 F (37.3 C), temperature source Oral, resp. rate 18, weight 128.2 kg (282 lb 9.6 oz), SpO2 100 %. No results found.  Recent Labs  08/09/16 0429  WBC 11.7*  HGB 10.6*  HCT 33.5*  PLT 314    Recent Labs  08/09/16 0429  NA 136  K 4.6  CL 103  GLUCOSE 91  BUN 40*  CREATININE 1.29*  CALCIUM 9.2   CBG (last 3)  No results for input(s): GLUCAP in the last 72 hours.  Wt Readings from Last 3 Encounters:  08/09/16 128.2 kg (282 lb 9.6 oz)  08/02/16 129.8 kg (286 lb 3.2 oz)  09/16/15 (!) 140.6 kg (310 lb)    Physical Exam:  BP 113/70 (BP Location: Left Arm)   Pulse 70   Temp 99.2 F (37.3 C) (Oral)   Resp 18   Wt 128.2 kg (282 lb 9.6 oz)   SpO2 100%   BMI 35.32 kg/m  Constitutional: He appears well-developed. Obese. Vital signs reviewed.  HENT: Normocephalicand atraumatic.  Eyes: Conjunctivaeand EOMare normal.  Cardiovascular: Normal rateand regular rhythm.  Respiratory: Effort normaland breath sounds normal. No respiratory distress.  GI: Soft. He exhibits no distension. Musculoskeletal: No tenderness, no edema. Left knee effusion with tenderness, no erythema Right ankle pain with ROM but no tenderness or erythema No scrotal edema Neurological: He is alert.  Follows simple commands. Motor: B/l UE: 5/5 proximal to distal RLE: Hip flexion 3+/5, knee extension 4/5, ankle dorsi/plantar flexion 5/5  LLE: Hip flexion 3/5, knee extension 4-/5, ankle dorsi/plantar flexion 5/5 Skin: Skin is warmand dry.  Venous stasis changes b/l LE, R>L Psychiatric: He has a normal mood and affect. His behavior is normal  Assessment/Plan: 1. Functional deficits secondary to  cystitis/hydronephrosis which require 3+ hours per day of interdisciplinary therapy in a comprehensive inpatient rehab setting. Physiatrist is providing close team supervision and 24 hour management of active medical problems listed below. Physiatrist and rehab team continue to assess barriers to discharge/monitor patient progress toward functional and medical goals.  Function:  Bathing Bathing position   Position: Wheelchair/chair at sink  Bathing parts Body parts bathed by patient: Right arm, Left arm, Chest, Abdomen, Right upper leg, Left upper leg Body parts bathed by helper: Right lower leg, Left lower leg  Bathing assist Assist Level: Touching or steadying assistance(Pt > 75%)      Upper Body Dressing/Undressing Upper body dressing   What is the patient wearing?: Pull over shirt/dress     Pull over shirt/dress - Perfomed by patient: Thread/unthread right sleeve, Thread/unthread left sleeve, Put head through opening Pull over shirt/dress - Perfomed by helper: Pull shirt over trunk        Upper body assist Assist Level: Touching or steadying assistance(Pt > 75%)      Lower Body Dressing/Undressing Lower body dressing   What is the patient wearing?: Underwear (mesh-underwear) Underwear - Performed by patient: Pull underwear up/down Underwear - Performed by helper: Thread/unthread left underwear leg, Thread/unthread right underwear leg, Pull underwear up/down   Pants- Performed by helper: Thread/unthread right pants leg, Thread/unthread left pants leg, Pull pants up/down   Non-skid slipper socks- Performed by helper: Don/doff right sock, Don/doff left sock  TED Hose - Performed by helper: Don/doff left TED hose, Don/doff right TED hose  Lower body assist Assist for lower body dressing: Touching or steadying assistance (Pt > 75%)      Toileting Toileting     Toileting steps completed by helper: Adjust clothing prior to toileting, Performs perineal  hygiene, Adjust clothing after toileting Toileting Assistive Devices: Grab bar or rail, Other (comment) (stedy)  Toileting assist     Transfers Chair/bed transfer   Chair/bed transfer method: Stand pivot Chair/bed transfer assist level: Touching or steadying assistance (Pt > 75%) Chair/bed transfer assistive device: Environmental manager lift: Ecologist Ambulation activity did not occur: Safety/medical concerns   Max distance: 27 ft Assist level: 2 helpers (Min A + w/c follow for safety)   Oceanographer activity did not occur:  (New pt, will be evaluated by PT in AM) Type: Manual Max wheelchair distance: 225 Assist Level: Supervision or verbal cues  Cognition Comprehension Comprehension assist level: Follows complex conversation/direction with no assist  Expression Expression assist level: Expresses complex ideas: With no assist  Social Interaction Social Interaction assist level: Interacts appropriately with others - No medications needed.  Problem Solving Problem solving assist level: Solves complex problems: Recognizes & self-corrects  Memory Memory assist level: Complete Independence: No helper     Medical Problem List and Plan: 1. Debilitationsecondary to cystitis/hydronephrosis  Cont CIR 2. DVT Prophylaxis/Anticoagulation: Subcutaneous heparin. Monitor platelet counts and any signs of bleeding 3. Pain Management: Ultram as needed 4. UTI/Aerococcus. Amoxicillin completed 8/30 5. Neuropsych: This patient iscapable of making decisions on hisown behalf. 6. Skin/Wound Care: Routine skin checks 7. Fluids/Electrolytes/Nutrition: Routine I&O's  AKI  Cont to encourage fluids, IVF on night of 8/31 8.CAD status post CABG. Discuss resumingaspirinwith [name] 9.Acute on chronic anemia.   Hb 10.6 on 8/31  Will cont to monitor 10.Hyperlipidemia. Resume Crestor 11.  Left knee effusion as well as right ankle joint pain: Improving   Uric acid  WNL 8/24  ESR elevated 8/24  Xrays reviewed, showing degenerative changes in ankle and effusion in knee.  Medrol dosepack 8/25-8/30 completed 12. HTN  Will monitor with increased activity  Lisinopril 5 started 8/26, increased to 10 on 8/27  Improved control 13. Urinary retention, now with incontinence  Foley placed by Urology on 8/21  D/ced 8/26 due to pain and swelling  Timed voiding   PVRs unremarkable  UA with bacteria, Ucx pending  Empiric macrobid started on 8/31 14. Bilateral complex hydroceles  U/S ordered and reviewed  Pain and edema has decreased after d/cing foley   Will need follow up as outpt 15. Leukocytosis  Likely steroid induced  Afebrile  WBCs 11.7 on 8/31, trending down  Will cont to monitor  LOS (Days) 7 A FACE TO FACE EVALUATION WAS PERFORMED  Zyionna Pesce [name] 08/09/2016 8:28 AM

## 2016-08-09 NOTE — Progress Notes (Signed)
Physical Therapy Session Note  Patient Details  Name: [name] MRN: [id number] Date of Birth: Apr 03, 1931  Today's Date: 08/09/2016 PT Individual Time: 0800-0900 AND 1515-1600  PT Individual Time Calculation (min): 60 min AND 45 min    Short Term Goals: Week 1:  PT Short Term Goal 1 (Week 1): patient will perform sit<>stand with mod A and RW.  PT Short Term Goal 2 (Week 1): patient will performed stand pivot/squat pivot with max A and LRAD  PT Short Term Goal 3 (Week 1): Patient will initiate gait training with LRAD.  PT Short Term Goal 4 (Week 1): Patient will performed bed mobility with min A   Skilled Therapeutic Interventions/Progress Updates:     Patient received supine in bed and agreeable to PT. PT assisted patient to don Ted hose with max A for time management.  Semirecumbent to sitting EOB with min A for assist at UE for trunk support.  Patient able to don pants and shirt with min A from PT for clothing management with sit>stand to pull pants to waist with close supervision A from PT.   Gait training instructed by PT for 229f and 2071fwith close supervision A. Min cues for improved posture and increased step length as tolerated. No change noted in gait pattern with cues.    PT instructed patient in dynamic balance/strengthing including toe taps on 4 inch step x 10 BLE and BUE support on RW. Step ups onto 4inch step with BUE support on Parallel bars x 6 BLE.  Dynamic balance for while playing horse shoes with self generated anterior/rotional perturbations; 2 round x 6 tosses with intermittent no UE support v 1 UE support.   Throughout treatment patient performed sit<>stand from elevated surface x 10 with close supervision A and min cues for safety with proper UE placement.  Session 2.   Patient received sitting in recliner and agreeable to PT. Patient performed sit<>stand with supervision A and RW and noted to have dislodged condom catheter and required personal hygiene  due to incontinent bladder movement. Patient performed sit<>stand x 5 for doff and donn clean brief and shorts with supervision A. Patient able to manage clothing with set up only from PT; no LOB noted.   Patient then transported to rehab gym for car transfer with min A from PT with mod cues for proper sit then rotate technique as well as proper UE placement to improve safety of transfer.   Patient performed gait training following car transfer with supervision A and RW for 12545fincreased step height noted with  Cues from PT.   Bed mobillity for sit>supine to prepare for RN care at end of treatment. Supervision Assist provided by PT with min cues for UE positioning and proper positioning prior to transfer.   Call bell left in reach and all needs met.      Therapy Documentation Precautions:  Precautions Precautions: Fall Precaution Comments: L knee/leg pain Restrictions Weight Bearing Restrictions: No    Pain: Pain Assessment Pain Assessment: No/denies pain   See Function Navigator for Current Functional Status.   Therapy/Group: Individual Therapy  AusLorie Phenix31/2017, 9:04 AM

## 2016-08-09 NOTE — Progress Notes (Signed)
Occupational Therapy Session Note  Patient Details  Name: [name] MRN: [id number] Date of Birth: 1931-07-14  Today's Date: 08/09/2016   Session 1 OT Individual Time: 1002-1101 OT Individual Time Calculation (min): 59 min   Session 2 OT Individual Time: 1300-1330 OT Individual Time Calculation (min): 30 min   Short Term Goals: Week 1:  OT Short Term Goal 1 (Week 1): Pt will be able to use a slide board to a heavy duty drop arm BSC with mod A. OT Short Term Goal 2 (Week 1): Pt will be able to tolerate have B knees in flexion at EOB to prepare for LB dressing. OT Short Term Goal 3 (Week 1): Pt will be able to sit to stand with +2 from EOB with mod A to prepare for LB dressing.  Skilled Therapeutic Interventions/Progress Updates:    Session 1 Pt seen for skilled OT therapy session with focus on functional toilet transfers, lower-body dressing, activity tolerance, and standing endurance. Pt able to ambulate to the bathroom with supervision and transfer on/off toilet using grab bars with Min A. Pt able to assist with hygiene, but required OT assist for steadying and thoroughness. Pt then completed LB dressing; he required assist to thread pants, underwear, and brief, but was able to help pull pants up over hips. Pt then brought down to therapy gym and participated in dynamic standing activities using wii. Pt able to remove one hand from RW to participate in activity for ~ 5 minute bouts before needing a sitting break 2/2 fatigue. Pt returned to room at end of session and left with needs met.  Session 2 Pt seen for second OT session with focus on therapeutic exercise and activity tolerance. Pt ambulated short distance in room with RW and supervision. He then completed 12 minutes on arm-bike without rest break. Pt then practiced sit<>stands with supervision prior to returning to room. OT noted red blistery rash on the back of pt's head/neck, spreading behind R ear and along the R side of pt's  mandible. OT notified RN and RN contacted the PA to come look. Pt left seated in recliner at end of session with needs met.    Therapy Documentation Precautions:  Precautions Precautions: Fall Precaution Comments: L knee/leg pain Restrictions Weight Bearing Restrictions: No General:   Vital Signs: Therapy Vitals Temp: 97.5 F (36.4 C) Temp Source: Oral Pulse Rate: 85 Resp: 18 BP: 99/64 Patient Position (if appropriate): Sitting Oxygen Therapy SpO2: 99 % O2 Device: Not Delivered Pain: Pain Assessment Pain Assessment: No/denies pain ADL: ADL ADL Comments: refer to functional navigator  See Function Navigator for Current Functional Status.   Therapy/Group: Individual Therapy  Valma Cava 08/09/2016, 4:07 PM

## 2016-08-09 NOTE — Progress Notes (Signed)
Nutrition Follow-up  DOCUMENTATION CODES:   Obesity unspecified  INTERVENTION:    D/C Nepro, patient says it gives him GI distress  Add Boost Breeze po TID, each supplement provides 250 kcal and 9 grams of protein  Recommend liberalize diet to Regular  NUTRITION DIAGNOSIS:   Inadequate oral intake related to poor appetite as evidenced by per patient/family report.  Ongoing  GOAL:   Patient will meet greater than or equal to 90% of their needs  Unmet  MONITOR:   PO intake, Supplement acceptance, Labs, Weight trends, Skin, I & O's  REASON FOR ASSESSMENT:   Malnutrition Screening Tool    ASSESSMENT:   82 y.o. male with history of hypertension, CAD status post CABG, BPH with kidney stones. Presented 07/29/2016 with recurrent pelvic pain 2 days. CT abdomen and pelvis showed moderate right sided mild left-sided hydronephrosis. No distal obstruction seen. Diffuse irregular bladder wall thickening and prominent surrounding soft inflammation compatible with cystitis. Follow-up renal ultrasound again showed mild hydronephrosis.  Patient reports poor intake of meals, because the "food is not seasoned." He stopped drinking Nepro supplements because it was giving him diarrhea. He agreed to try Boost Breeze supplements instead. He requests salt and pepper for his meal trays. Per flow sheet documentation, patient has been consuming 100% of meals. No need for renal diet restriction, since potassium WNL and patient is not on HD.   Diet Order:  Diet renal with fluid restriction Fluid restriction: 1800 mL Fluid; Room service appropriate? Yes; Fluid consistency: Thin  Skin:  Reviewed, no issues  Last BM:  8/30  Height:   Ht Readings from Last 1 Encounters:  07/28/16 6\' 3"  (1.905 m)    Weight:   Wt Readings from Last 1 Encounters:  08/09/16 282 lb 9.6 oz (128.2 kg)    Ideal Body Weight:  89.09 kg  BMI:  Body mass index is 35.32 kg/m.  Estimated Nutritional Needs:    Kcal:  1900-2100  Protein:  90-105 grams  Fluid:  1.8 L/day  EDUCATION NEEDS:   Education needs addressed  Molli Barrows, [place], [place], [place] Pager [phone] After Hours Pager [phone]

## 2016-08-10 ENCOUNTER — Inpatient Hospital Stay (HOSPITAL_COMMUNITY): Payer: Medicare Other | Admitting: Occupational Therapy

## 2016-08-10 ENCOUNTER — Inpatient Hospital Stay (HOSPITAL_COMMUNITY): Payer: Medicare Other | Admitting: Physical Therapy

## 2016-08-10 DIAGNOSIS — L259 Unspecified contact dermatitis, unspecified cause: Secondary | ICD-10-CM

## 2016-08-10 DIAGNOSIS — N179 Acute kidney failure, unspecified: Secondary | ICD-10-CM

## 2016-08-10 LAB — BASIC METABOLIC PANEL
Anion gap: 6 (ref 5–15)
BUN: 29 mg/dL — AB (ref 6–20)
CHLORIDE: 104 mmol/L (ref 101–111)
CO2: 24 mmol/L (ref 22–32)
CREATININE: 1.2 mg/dL (ref 0.61–1.24)
Calcium: 9.3 mg/dL (ref 8.9–10.3)
GFR calc Af Amer: >60 mL/min (ref >60)
GFR calc non Af Amer: 54 mL/min — ABNORMAL LOW (ref >60)
Glucose, Bld: 98 mg/dL (ref 65–99)
Potassium: 4.4 mmol/L (ref 3.5–5.1)
SODIUM: 134 mmol/L — AB (ref 135–145)

## 2016-08-10 LAB — URINE CULTURE: Culture: NO GROWTH

## 2016-08-10 MED ORDER — DIPHENHYDRAMINE HCL 12.5 MG/5ML PO ELIX
12.5000 mg | ORAL_SOLUTION | Freq: Three times a day (TID) | ORAL | Status: DC
  Administered 2016-08-10 – 2016-08-14 (×12): 12.5 mg via ORAL
  Filled 2016-08-10 (×14): qty 10

## 2016-08-10 MED ORDER — DIPHENHYDRAMINE-ZINC ACETATE 2-0.1 % EX CREA
TOPICAL_CREAM | Freq: Three times a day (TID) | CUTANEOUS | Status: DC | PRN
  Administered 2016-08-10: 09:00:00 via TOPICAL
  Filled 2016-08-10: qty 28

## 2016-08-10 MED ORDER — DIPHENHYDRAMINE-ZINC ACETATE 2-0.1 % EX CREA
TOPICAL_CREAM | Freq: Three times a day (TID) | CUTANEOUS | Status: DC
Start: 2016-08-10 — End: 2016-08-16
  Administered 2016-08-10: 1 via TOPICAL
  Administered 2016-08-10 – 2016-08-16 (×17): via TOPICAL
  Filled 2016-08-10: qty 28

## 2016-08-10 MED ORDER — LIDOCAINE 4 % EX CREA
TOPICAL_CREAM | Freq: Four times a day (QID) | CUTANEOUS | Status: DC | PRN
  Administered 2016-08-10 – 2016-08-12 (×5): 1 via TOPICAL
  Administered 2016-08-13: 10:00:00 via TOPICAL
  Filled 2016-08-10 (×4): qty 5

## 2016-08-10 MED ORDER — DIPHENHYDRAMINE HCL 25 MG PO CAPS
25.0000 mg | ORAL_CAPSULE | Freq: Once | ORAL | Status: AC
  Administered 2016-08-10: 25 mg via ORAL
  Filled 2016-08-10: qty 1

## 2016-08-10 NOTE — Progress Notes (Signed)
Occupational Therapy Weekly and Daily Progress Note  Patient Details  Name: [name] MRN: [id number] Date of Birth: Aug 14, 1931  Beginning of progress report period: August 03, 2016 End of progress report period: August 10, 2016  Today's Date: 08/10/2016 OT Individual Time: 1005-1100 OT Individual Time Calculation (min): 55 min   Patient has met 4 of 4 short term goals 2/2 increased strength, activity tolerance, and standing balance. He is able to perform functional transfers with Min steady assist, LB dressing with Mod A, and maintain standing balance for toileting with Min A.    Patient continues to demonstrate the following deficits: muscle weakness, decreased cardiorespiratoy endurance, decreased standing balance, bathing, dressing, toileting, and functional transfers and therefore will continue to benefit from skilled OT intervention to enhance overall performance with BADL.  Patient progressing toward long term goals..  Continue plan of care.  OT Short Term Goals Week 1:  OT Short Term Goal 1 (Week 1): Pt will be able to use a slide board to a heavy duty drop arm BSC with mod A. OT Short Term Goal 1 - Progress (Week 1): Met OT Short Term Goal 2 (Week 1): Pt will be able to tolerate have B knees in flexion at EOB to prepare for LB dressing. OT Short Term Goal 2 - Progress (Week 1): Met OT Short Term Goal 3 (Week 1): Pt will be able to sit to stand with +2 from EOB with mod A to prepare for LB dressing. OT Short Term Goal 3 - Progress (Week 1): Met Week 2:  OT Short Term Goal 1 (Week 2): Pt will complete LB dressing with Min A OT Short Term Goal 2 (Week 2): Pt will complete functional shower transfer with supervision OT Short Term Goal 3 (Week 2): Pt will perform toileting with supervision OT Short Term Goal 4 (Week 2): Pt will maintain dynamic standing balance w/ RW at the sink while reaching to complete grooming task w/ supervision   Skilled Therapeutic  Interventions/Progress Updates:    Pt seen for skilled OT with focus on dynamic standing balance/endurance, IADLs, and general strengthening exercises using thera-band. Pt able able to ambulate in the hallway with RW and 50% supervision to the laundry room. Pt able to maintain standing balance w/ stand-by assist and RW to place clothing into top loading washing machine. Pt able to reach outside base of support with 1 UE at a time to complete activity. Pt then performed UB and LB there ex- using thera-bands. Pt returned to room at end of session and left with needs met.   Therapy Documentation Precautions:  Precautions Precautions: Fall Precaution Comments: L knee/leg pain Restrictions Weight Bearing Restrictions: No Pain: Pain Assessment Pain Assessment: No/denies pain ADL: ADL ADL Comments: refer to functional navigator   See Function Navigator for Current Functional Status.  Therapy/Group: Individual Therapy  Valma Cava 08/10/2016, 5:01 PM

## 2016-08-10 NOTE — Progress Notes (Signed)
[facility]     PROGRESS NOTE  Subjective/Complaints:  Pt sitting up in bed eating breakfast.  He states he did not sleep well overnight due to a rash of neck.  ROS:  +Rash. Denies CP, SOB, N/V/D.  Objective: Vital Signs: Blood pressure 132/62, pulse 79, temperature 98.3 F (36.8 C), temperature source Oral, resp. rate 18, weight 125.5 kg (276 lb 11.2 oz), SpO2 91 %. No results found.  Recent Labs  08/09/16 0429  WBC 11.7*  HGB 10.6*  HCT 33.5*  PLT 314    Recent Labs  08/09/16 0429 08/10/16 0712  NA 136 134*  K 4.6 4.4  CL 103 104  GLUCOSE 91 98  BUN 40* 29*  CREATININE 1.29* 1.20  CALCIUM 9.2 9.3   CBG (last 3)  No results for input(s): GLUCAP in the last 72 hours.  Wt Readings from Last 3 Encounters:  08/10/16 125.5 kg (276 lb 11.2 oz)  08/02/16 129.8 kg (286 lb 3.2 oz)  09/16/15 (!) 140.6 kg (310 lb)    Physical Exam:  BP 132/62 (BP Location: Right Arm)   Pulse 79   Temp 98.3 F (36.8 C) (Oral)   Resp 18   Wt 125.5 kg (276 lb 11.2 oz)   SpO2 91%   BMI 34.59 kg/m  Constitutional: He appears well-developed. Obese. Vital signs reviewed.  HENT: Normocephalicand atraumatic.  Eyes: Conjunctivaeand EOMare normal.  Cardiovascular: Normal rateand regular rhythm.  Respiratory: Effort normaland breath sounds normal. No respiratory distress.  GI: Soft. He exhibits no distension. Musculoskeletal: No tenderness, no edema in extremities. Left knee effusion with tenderness, no erythema Right ankle pain with ROM but no tenderness or erythema No scrotal edema Neurological: He is alert.  Follows simple commands. Motor: B/l UE: 5/5 proximal to distal RLE: Hip flexion 3+/5, knee extension 4/5, ankle dorsi/plantar flexion 5/5  LLE: Hip flexion 3/5, knee extension 4-/5, ankle dorsi/plantar flexion 5/5 Skin: Skin is warmand dry.  Venous stasis changes b/l LE, R>L Rash - lateral and posterior right neck with  blistering Psychiatric: He has a normal mood and affect. His behavior is normal  Assessment/Plan: 1. Functional deficits secondary to cystitis/hydronephrosis which require 3+ hours per day of interdisciplinary therapy in a comprehensive inpatient rehab setting. Physiatrist is providing close team supervision and 24 hour management of active medical problems listed below. Physiatrist and rehab team continue to assess barriers to discharge/monitor patient progress toward functional and medical goals.  Function:  Bathing Bathing position   Position: Wheelchair/chair at sink  Bathing parts Body parts bathed by patient: Right arm, Left arm, Chest, Abdomen, Right upper leg, Left upper leg Body parts bathed by helper: Right lower leg, Left lower leg  Bathing assist Assist Level: Touching or steadying assistance(Pt > 75%)      Upper Body Dressing/Undressing Upper body dressing   What is the patient wearing?: Pull over shirt/dress     Pull over shirt/dress - Perfomed by patient: Thread/unthread right sleeve, Thread/unthread left sleeve, Put head through opening Pull over shirt/dress - Perfomed by helper: Pull shirt over trunk        Upper body assist Assist Level: Touching or steadying assistance(Pt > 75%)      Lower Body Dressing/Undressing Lower body dressing   What is the patient wearing?: Underwear, Pants (mesh underwear) Underwear - Performed by patient: Pull underwear up/down Underwear - Performed by helper: Thread/unthread right underwear leg, Thread/unthread left underwear leg Pants- Performed by patient: Pull pants up/down Pants- Performed by  helper: Thread/unthread right pants leg, Thread/unthread left pants leg   Non-skid slipper socks- Performed by helper: Don/doff right sock, Don/doff left sock               TED Hose - Performed by helper: Don/doff left TED hose, Don/doff right TED hose  Lower body assist Assist for lower body dressing: Touching or steadying  assistance (Pt > 75%)      Toileting Toileting   Toileting steps completed by patient: Adjust clothing prior to toileting, Performs perineal hygiene Toileting steps completed by helper: Adjust clothing after toileting Toileting Assistive Devices: Grab bar or rail  Toileting assist Assist level: Touching or steadying assistance (Pt.75%)   Transfers Chair/bed transfer   Chair/bed transfer method: Stand pivot Chair/bed transfer assist level: Supervision or verbal cues Chair/bed transfer assistive device: Environmental consultant, Marine scientist lift: Ecologist Ambulation activity did not occur: Safety/medical concerns   Max distance: 205 Assist level: Supervision or verbal cues   Wheelchair Wheelchair activity did not occur:  (New pt, will be evaluated by PT in AM) Type: Manual Max wheelchair distance: 225 Assist Level: Supervision or verbal cues  Cognition Comprehension Comprehension assist level: Follows complex conversation/direction with no assist  Expression Expression assist level: Expresses complex ideas: With no assist  Social Interaction Social Interaction assist level: Interacts appropriately with others - No medications needed.  Problem Solving Problem solving assist level: Solves complex problems: Recognizes & self-corrects  Memory Memory assist level: Complete Independence: No helper     Medical Problem List and Plan: 1. Debilitationsecondary to cystitis/hydronephrosis  Cont CIR 2. DVT Prophylaxis/Anticoagulation: Subcutaneous heparin. Monitor platelet counts and any signs of bleeding 3. Pain Management: Ultram as needed 4. UTI/Aerococcus. Amoxicillin completed 8/30 5. Neuropsych: This patient iscapable of making decisions on hisown behalf. 6. Skin/Wound Care: Routine skin checks 7. Fluids/Electrolytes/Nutrition: Routine I&O's  Cont to encourage fluids, IVF on night of 8/31  AKI, Cr 1.20 on 9/1 (improving)  Labs ordered for Monday 8.CAD status  post CABG. Discuss resumingaspirinwith [name] 9.Acute on chronic anemia.   Hb 10.6 on 8/31  Will cont to monitor  Labs ordered for Monday 10.Hyperlipidemia. Resume Crestor 11.  Left knee effusion as well as right ankle joint pain: Improving   Uric acid WNL 8/24  ESR elevated 8/24  Xrays reviewed, showing degenerative changes in ankle and effusion in knee.  Medrol dosepack 8/25-8/30 completed 12. HTN  Will monitor with increased activity  Lisinopril 5 started 8/26, increased to 10 on 8/27  Improved control 13. Urinary incontinence  Foley placed by Urology on 8/21, d/ced 8/26 due to pain and swelling  Timed voiding   PVRs unremarkable  UA with bacteria, Ucx NG  Empiric macrobid started on 8/31, d/ced on 9/1  Will request Urology input.  Appreciate recs.  14. Bilateral complex hydroceles  U/S ordered and reviewed  Pain and edema has decreased after d/cing foley   Will need follow up as outpt 15. Leukocytosis  Likely steroid induced  Afebrile  WBCs 11.7 on 8/31, trending down  Will cont to monitor  Labs ordered for Monday 16. Contact dermatitis  Right neck, unclear source  Steroid cream appears ineffective.    Will start benadryl cream on 9/1, if ineffective, may need systemic treatment  LOS (Days) 8 A FACE TO FACE EVALUATION WAS PERFORMED  Tiffinie Caillier [name] 08/10/2016 8:14 AM

## 2016-08-10 NOTE — Progress Notes (Signed)
Pt has new rash to right neck and scalp. Raised reddened areas with multiple blisters present spreading from right neck under chin to right cheek and back of scalp. Pt complains of discomfort. Marlowe Shores, PA notified with assessing area. New orders for hydrocortisone cream scheduled TID.

## 2016-08-10 NOTE — Progress Notes (Signed)
Occupational Therapy Session Note  Patient Details  Name: ZOEY REPINSKI MRN: [id number] Date of Birth: January 16, 1931  Today's Date: 08/10/2016 OT Individual Time: 1432-1500 OT Individual Time Calculation (min): 28 min     Short Term Goals: Week 2:  OT Short Term Goal 1 (Week 2): Pt will complete LB dressing with Min A OT Short Term Goal 2 (Week 2): Pt will complete functional shower transfer with supervision OT Short Term Goal 3 (Week 2): Pt will perform toileting with supervision OT Short Term Goal 4 (Week 2): Pt will maintain dynamic standing balance w/ RW at the sink while reaching to complete grooming task w/ supervision  Skilled Therapeutic Interventions/Progress Updates:    Pt completed functional mobility using the RW to the dayroom with min guard assist.  After brief rest break he ambulated back to the room with min guard assist and completed simulated toilet transfers to the bedside chair.  He then performed UE light therex using medium resistance theraband in sitting.  Completed one set of 10 repetitions for shoulder flexion, shoulder horizontal abduction, and elbow flexion bilaterally.  Pt left in bedside chair at end of session with call button in place.   Therapy Documentation Precautions:  Precautions Precautions: Fall Precaution Comments:   Restrictions Weight Bearing Restrictions: No  Pain: Pain Assessment Pain Assessment: No/denies pain ADL: See Function Navigator for Current Functional Status.   Therapy/Group: Individual Therapy  Malyssa Maris OTR/L 08/10/2016, 5:06 PM

## 2016-08-10 NOTE — Progress Notes (Signed)
Patient complaining of constant pain & burning to blisters on right side of neck all the way up to back of right ear & back of head. Blisters are small & scattered & intact. Medicated with pain meds & hydrocortisone cream with  minimal effect. Rapid response nurse came to evaluate patient. PA notified with order for hsv smear & lidocaine cream. Per lab, hsv smear is not being done inhouse & sample needs to be sent out. Vital signs stable, afebrile, in no acute distress.

## 2016-08-10 NOTE — Consult Note (Addendum)
[facility] Nurse wound consult note Reason for Consult: Consult requested to assess blistering areas to right posterior neck and lower chin, which is reported to have started last night. The cause is unknown etiology; and is atypical for shingles, but this could be possible. Pt states they are itching and burning and he denies using any new products near this area which may have contributed to contact dermatitis.  Affected area is approx 14X14cm, with white raised blisters surrounded by generalized erythremia. There is no drainage or open wounds which indicate wound care products are needed at this time.   Dressing procedure/placement/frequency: Discussed plan of care with Pam, PA at the bedside.  Benadryl cream and lidocaine gel has already been ordered for itching and burning symptoms.  Agree with present plan of care. Please re-consult if further assistance is needed.  Thank-you,  Julien Girt MSN, [place], [place] [facility], [place], [place]

## 2016-08-10 NOTE — Plan of Care (Signed)
Problem: RH Ambulation Goal: LTG Patient will ambulate in controlled environment (PT) LTG: Patient will ambulate in a controlled environment, # of feet with assistance (PT).  Upgraded due to progress.

## 2016-08-10 NOTE — Progress Notes (Signed)
RN called for a second set of eyes. Pt developed blisters along his Right lateral neck line the night before. RN noticed blisters were spreading going around face, ear, and scalp. Pt given hydrocortisone cream for blisters. Pt states the pain is more intense burning pain. RN encouraged to page dr with new findings. HSV smear has bee ordered. Pt in distress, alert and oriented x4

## 2016-08-10 NOTE — Progress Notes (Signed)
Physical Therapy Weekly Progress Note  Patient Details  Name: [name] MRN: [id number] Date of Birth: 05/13/1931  Beginning of progress report period: August 03, 2016 End of progress report period: August 10, 2016  Today's Date: 08/10/2016 PT Individual Time:  800-900 AND 1346-1430 Calculated PT individual time: 60 min AND 44 min       Patient has met 4 of 4 short term goals.    Patient continues to demonstrate the following deficits: Strength, balance, transfers, gait and therefore will continue to benefit from skilled PT intervention to enhance overall performance with activity tolerance and balance.  Patient progressing toward long term goals..  Continue plan of care.  PT Short Term Goals Week 1:  PT Short Term Goal 1 (Week 1): patient will perform sit<>stand with mod A and RW.  PT Short Term Goal 1 - Progress (Week 1): Met PT Short Term Goal 2 (Week 1): patient will performed stand pivot/squat pivot with max A and LRAD  PT Short Term Goal 2 - Progress (Week 1): Met PT Short Term Goal 3 (Week 1): Patient will initiate gait training with LRAD.  PT Short Term Goal 3 - Progress (Week 1): Met PT Short Term Goal 4 (Week 1): Patient will performed bed mobility with min A  PT Short Term Goal 4 - Progress (Week 1): Met Week 2:  PT Short Term Goal 1 (Week 2): LTG = STG due to Estimated d/c date.    Skilled Therapeutic Interventions/Progress Updates:     Session 1 Patient received supine in bed and agreeable to PT. Patient performed supine>sit with supervision A from PT with minimal use of bed rails. Patient noted to have lost Condom catheter and had expereince incontinent bladder episode. PT assist patient in doffing and donning clean brief and shorts with set up assist using sit>stand to don brief and pants with supervision A. Patient also performed self hygiene while standing with set up only.   Patient performed gait training for 239f with RW and supervision A from PT. PT  provided min cues for improved step height to prevent foot drag.   PT instructed patient in stair negotiation on 8 3 inch steps with BUE support on rail and 4 6inch steps with BUE support on rail.  Curb management with RW x 2 with Min A on first trial and supervision A on second trial.  PT provided mod multimodal cues for stair and curb management for AD control.   [facility] returned to [facility]    Session 2 Patient received sitting in recliner and agreeable to PT with light pain in knee 3/10.   Patient agreeale to PT. Patient performed stand pivoty transfer training from recliner to  With supervision A.   WC mobility training performed in hall for 1822fwith supervision A from PT with 1 short rest break at [id number] WC training x [id number] provided min cues for improved posture.   PT instructed patient in car transfer x 2 with RW and supervision A for safety and proper UE placement on stable surface.   PT instructed patient in Home exercise program including LAQ x 10, minisquats x 10 and standing HS curl x 10 mod cues improved technique and proper speed of movement.    Patient left sitting in recliner with call bell in reach.        Therapy Documentation Precautions:  Precautions Precautions: Fall Precaution Comments: L knee/leg pain Restrictions Weight  Bearing Restrictions: No General:   Vital Signs: Therapy Vitals Temp: 98.3 F (36.8 C) Temp Source: Oral Pulse Rate: 79 Resp: 18 BP: 132/62 Patient Position (if appropriate): Lying Oxygen Therapy SpO2: 91 % O2 Device: Not Delivered Pain: Pain Assessment Pain Score: 5  Pain Type: Acute pain Pain Location: Neck Pain Orientation: Right Pain Descriptors / Indicators: Burning Pain Intervention(s): Medication (See eMAR)   See Function Navigator for Current Functional Status.  Therapy/Group: Individual Therapy   [name] 08/10/2016, 8:00 AM

## 2016-08-10 NOTE — Plan of Care (Signed)
Problem: RH Balance Goal: LTG Patient will maintain dynamic standing with ADLs (OT) LTG:  Patient will maintain dynamic standing balance with assist during activities of daily living (OT)   Goals upgraded 2/2 increased strength/activity tolerance  Problem: RH Bathing Goal: LTG Patient will bathe with assist, cues/equipment (OT) LTG: Patient will bathe specified number of body parts with assist with/without cues using equipment (position)  (OT)  Goals upgraded 2/2 increased strength/activity tolerance  Problem: RH Toileting Goal: LTG Patient will perform toileting w/assist, cues/equip (OT) LTG: Patient will perform toiletiing (clothes management/hygiene) with assist, with/without cues using equipment (OT)  Goals upgraded 2/2 increased strength/activity tolerance  Problem: RH Toilet Transfers Goal: LTG Patient will perform toilet transfers w/assist (OT) LTG: Patient will perform toilet transfers with assist, with/without cues using equipment (OT)  Goals upgraded 2/2 increased strength/activity tolerance  Problem: RH Tub/Shower Transfers Goal: LTG Patient will perform tub/shower transfers w/assist (OT) LTG: Patient will perform tub/shower transfers with assist, with/without cues using equipment (OT)  Goals upgraded 2/2 increased strength/activity tolerance  Comments: Goals upgraded 2/2 increased strength/activity tolerance

## 2016-08-10 NOTE — Progress Notes (Signed)
Rash reported to worsen with steroid cream. Benadryl cream helping with itching and burning. Blistered rash unusual per WOC-- contact v/s  Herpetic? Will schedule oral benadryl as well as cream qid and monitor.

## 2016-08-10 NOTE — Progress Notes (Signed)
Pt complaining of constant burning and discomfort to rash right neck and scalp. Raised reddened areas with multiple blisters present spreading from right neck under chin to right cheek and back of scalp. RN noted new blisters from 8-31 assessment. RN notified Posey Pronto, MD with finding and pt complaining of burning to area. Patel assessed area with new orders of PRN benadryl cream TID. RN will continue to monitor site.

## 2016-08-11 ENCOUNTER — Inpatient Hospital Stay (HOSPITAL_COMMUNITY): Payer: Medicare Other | Admitting: Physical Therapy

## 2016-08-11 DIAGNOSIS — B029 Zoster without complications: Secondary | ICD-10-CM

## 2016-08-11 DIAGNOSIS — N179 Acute kidney failure, unspecified: Secondary | ICD-10-CM

## 2016-08-11 MED ORDER — GABAPENTIN 100 MG PO CAPS
100.0000 mg | ORAL_CAPSULE | Freq: Two times a day (BID) | ORAL | Status: DC
  Administered 2016-08-11 – 2016-08-12 (×3): 100 mg via ORAL
  Filled 2016-08-11 (×3): qty 1

## 2016-08-11 MED ORDER — OXYCODONE HCL 5 MG PO TABS
5.0000 mg | ORAL_TABLET | ORAL | Status: DC | PRN
  Administered 2016-08-11 – 2016-08-15 (×13): 5 mg via ORAL
  Filled 2016-08-11 (×13): qty 1

## 2016-08-11 MED ORDER — OXYCODONE HCL 5 MG PO TABS
5.0000 mg | ORAL_TABLET | Freq: Four times a day (QID) | ORAL | Status: DC | PRN
  Administered 2016-08-11: 5 mg via ORAL
  Filled 2016-08-11: qty 1

## 2016-08-11 NOTE — Progress Notes (Signed)
[place] PHYSICAL MEDICINE & REHABILITATION     PROGRESS NOTE  Subjective/Complaints:  Having substantial pain right neck. Affected sleep. " I would be doing great if it weren't for this."  ROS:  +Rash. Denies CP, SOB, N/V/D.  Objective: Vital Signs: Blood pressure (!) 95/47, pulse 82, temperature 97.4 F (36.3 C), temperature source Oral, resp. rate 17, weight 125.2 kg (276 lb), SpO2 92 %. No results found.  Recent Labs  08/09/16 0429  WBC 11.7*  HGB 10.6*  HCT 33.5*  PLT 314    Recent Labs  08/09/16 0429 08/10/16 0712  NA 136 134*  K 4.6 4.4  CL 103 104  GLUCOSE 91 98  BUN 40* 29*  CREATININE 1.29* 1.20  CALCIUM 9.2 9.3   CBG (last 3)  No results for input(s): GLUCAP in the last 72 hours.  Wt Readings from Last 3 Encounters:  08/11/16 125.2 kg (276 lb)  08/02/16 129.8 kg (286 lb 3.2 oz)  09/16/15 (!) 140.6 kg (310 lb)    Physical Exam:  BP (!) 95/47 (BP Location: Right Arm)   Pulse 82   Temp 97.4 F (36.3 C) (Oral)   Resp 17   Wt 125.2 kg (276 lb)   SpO2 92%   BMI 34.50 kg/m  Constitutional: He appears well-developed. Obese. Vital signs reviewed.  HENT: Normocephalicand atraumatic.  Eyes: Conjunctivaeand EOMare normal.  Cardiovascular: Normal rateand regular rhythm.  Respiratory: Effort normaland breath sounds normal. No respiratory distress.  GI: Soft. He exhibits no distension. Musculoskeletal: No tenderness, no edema in extremities. Left knee effusion with tenderness, no erythema Right ankle pain with ROM but no tenderness or erythema No scrotal edema Neurological: He is alert.  Follows simple commands. Motor: B/l UE: 5/5 proximal to distal RLE: Hip flexion 3+/5, knee extension 4/5, ankle dorsi/plantar flexion 5/5  LLE: Hip flexion 3/5, knee extension 4-/5, ankle dorsi/plantar flexion 5/5 Skin: Skin is warmand dry.  Venous stasis changes b/l LE, R>L Rash - lateral and posterior right neck with blistering, red rash spanning about  6x8" area. Very sensitive to touch Psychiatric: He has a normal mood and affect. His behavior is normal  Assessment/Plan: 1. Functional deficits secondary to cystitis/hydronephrosis which require 3+ hours per day of interdisciplinary therapy in a comprehensive inpatient rehab setting. Physiatrist is providing close team supervision and 24 hour management of active medical problems listed below. Physiatrist and rehab team continue to assess barriers to discharge/monitor patient progress toward functional and medical goals.  Function:  Bathing Bathing position   Position: Wheelchair/chair at sink  Bathing parts Body parts bathed by patient: Right arm, Left arm, Chest, Abdomen, Right upper leg, Left upper leg Body parts bathed by helper: Right lower leg, Left lower leg  Bathing assist Assist Level: Touching or steadying assistance(Pt > 75%)      Upper Body Dressing/Undressing Upper body dressing   What is the patient wearing?: Pull over shirt/dress     Pull over shirt/dress - Perfomed by patient: Thread/unthread right sleeve, Thread/unthread left sleeve, Put head through opening Pull over shirt/dress - Perfomed by helper: Pull shirt over trunk        Upper body assist Assist Level: Touching or steadying assistance(Pt > 75%)      Lower Body Dressing/Undressing Lower body dressing   What is the patient wearing?: Underwear, Pants (mesh underwear) Underwear - Performed by patient: Pull underwear up/down Underwear - Performed by helper: Thread/unthread right underwear leg, Thread/unthread left underwear leg Pants- Performed by patient: Pull pants up/down Pants-  Performed by helper: Thread/unthread right pants leg, Thread/unthread left pants leg   Non-skid slipper socks- Performed by helper: Don/doff right sock, Don/doff left sock               TED Hose - Performed by helper: Don/doff left TED hose, Don/doff right TED hose  Lower body assist Assist for lower body dressing:  Touching or steadying assistance (Pt > 75%)      Toileting Toileting   Toileting steps completed by patient: Adjust clothing prior to toileting, Performs perineal hygiene, Adjust clothing after toileting Toileting steps completed by helper: Adjust clothing after toileting Toileting Assistive Devices: Grab bar or rail  Toileting assist Assist level: Supervision or verbal cues   Transfers Chair/bed transfer   Chair/bed transfer method: Stand pivot Chair/bed transfer assist level: Supervision or verbal cues Chair/bed transfer assistive device: Environmental consultant, Marine scientist lift: Ecologist Ambulation activity did not occur: Safety/medical concerns   Max distance: 135f  Assist level: Supervision or verbal cues   Wheelchair Wheelchair activity did not occur:  (New pt, will be evaluated by PT in AM) Type: Manual Max wheelchair distance: 1838f Assist Level: Supervision or verbal cues  Cognition Comprehension Comprehension assist level: Follows complex conversation/direction with no assist  Expression Expression assist level: Expresses complex ideas: With no assist  Social Interaction Social Interaction assist level: Interacts appropriately with others - No medications needed.  Problem Solving Problem solving assist level: Solves complex problems: Recognizes & self-corrects  Memory Memory assist level: Complete Independence: No helper     Medical Problem List and Plan: 1. Debilitationsecondary to cystitis/hydronephrosis  Cont CIR 2. DVT Prophylaxis/Anticoagulation: Subcutaneous heparin. Monitor platelet counts and any signs of bleeding 3. Pain Management: Ultram as needed 4. UTI/Aerococcus. Amoxicillin completed 8/30 5. Neuropsych: This patient iscapable of making decisions on hisown behalf. 6. Skin/Wound Care: Routine skin checks 7. Fluids/Electrolytes/Nutrition: Routine I&O's  Cont to encourage fluids, IVF on night of 8/31  AKI, Cr 1.20 on 9/1  (improving)  Labs  for Monday 8.CAD status post CABG. Discuss resumingaspirinwith Dr. HaTerrence Dupont.Acute on chronic anemia.   Hb 10.6 on 8/31  Will cont to monitor  Labs ordered for Monday 10.Hyperlipidemia. Resume Crestor 11.  Left knee effusion as well as right ankle joint pain: Improving   Uric acid WNL 8/24  ESR elevated 8/24--this may be due to shingles  Xrays reviewed, showing degenerative changes in ankle and effusion in knee.  Medrol dosepack 8/25-8/30 completed 12. HTN  Will monitor with increased activity  Lisinopril 5 started 8/26, increased to 10 on 8/27  Improved control 13. Urinary incontinence  Foley placed by Urology on 8/21, d/ced 8/26 due to pain and swelling  Timed voiding   PVRs unremarkable  UA with bacteria, Ucx NG  Empiric macrobid started on 8/31, d/ced on 9/1  Will request Urology input.  Appreciate recs.  14. Bilateral complex hydroceles  U/S ordered and reviewed  Pain and edema has decreased after d/cing foley   Will need follow up as outpt 15. Leukocytosis  Likely steroid induced vs #16  Afebrile  WBCs 11.7 on 8/31, trending down  Will cont to monitor  Labs ordered for Monday 16. Contact dermatitis vs shingles  Right neck,   -at this point no indication for acyclovir given course/presentation -add gabapentin for pain control  -dc tramadol, try oxycodone low dose for better pain relief also  -continue topical rx's as already prescribed  -appreciate WOC help  LOS (Days) 9 A FACE TO  FACE EVALUATION WAS PERFORMED  Leonard Feigel T 08/11/2016 8:40 AM

## 2016-08-11 NOTE — Progress Notes (Signed)
Physical Therapy Session Note  Patient Details  Name: [name] MRN: [id number] Date of Birth: 06-04-1931  Today's Date: 08/11/2016      Short Term Goals: Week 1:  PT Short Term Goal 1 (Week 1): patient will perform sit<>stand with mod A and RW.  PT Short Term Goal 1 - Progress (Week 1): Met PT Short Term Goal 2 (Week 1): patient will performed stand pivot/squat pivot with max A and LRAD  PT Short Term Goal 2 - Progress (Week 1): Met PT Short Term Goal 3 (Week 1): Patient will initiate gait training with LRAD.  PT Short Term Goal 3 - Progress (Week 1): Met PT Short Term Goal 4 (Week 1): Patient will performed bed mobility with min A  PT Short Term Goal 4 - Progress (Week 1): Met  Week 2: PT short term goal 1: LTG=STG   Skilled Therapeutic Interventions/Progress Updates:     PT attempted to see patient, but patient reports need to rest due to new medication for rash making him tired significant burning sensation in face/neck at site of rash. PT education patient on need for continual therapy and OOB activity. Patient stated that he would be ready for therapy "in the morning" provided that he can "rest tonight"    Therapy Documentation Precautions:  Precautions Precautions: Fall Precaution Comments:   Restrictions Weight Bearing Restrictions: No General: PT Amount of Missed Time (min): 45 Minutes PT Missed Treatment Reason: Pain;Patient fatigue    Vital Signs: Therapy Vitals Temp: 97.6 F (36.4 C) Temp Source: Oral Pulse Rate: 84 Resp: 16 BP: (!) 104/47 Patient Position (if appropriate): Lying Oxygen Therapy SpO2: 94 % O2 Device: Not Delivered  See Function Navigator for Current Functional Status.   Therapy/Group: Individual Therapy  [name] 08/11/2016, 5:07 PM

## 2016-08-12 ENCOUNTER — Inpatient Hospital Stay (HOSPITAL_COMMUNITY): Payer: Medicare Other | Admitting: Physical Therapy

## 2016-08-12 MED ORDER — GABAPENTIN 100 MG PO CAPS
100.0000 mg | ORAL_CAPSULE | Freq: Three times a day (TID) | ORAL | Status: DC
  Administered 2016-08-12 – 2016-08-14 (×8): 100 mg via ORAL
  Filled 2016-08-12 (×9): qty 1

## 2016-08-12 NOTE — Progress Notes (Signed)
[place] PHYSICAL MEDICINE & REHABILITATION     PROGRESS NOTE  Subjective/Complaints:  Pain much improved. Last used oxycodone late last night.  ROS:  +Rash. Denies CP, SOB, N/V/D.  Objective: Vital Signs: Blood pressure 95/67, pulse 97, temperature 98.3 F (36.8 C), temperature source Oral, resp. rate 17, weight (!) 142.1 kg (313 lb 4.4 oz), SpO2 96 %. No results found. No results for input(s): WBC, HGB, HCT, PLT in the last 72 hours.  Recent Labs  08/10/16 0712  NA 134*  K 4.4  CL 104  GLUCOSE 98  BUN 29*  CREATININE 1.20  CALCIUM 9.3   CBG (last 3)  No results for input(s): GLUCAP in the last 72 hours.  Wt Readings from Last 3 Encounters:  08/12/16 (!) 142.1 kg (313 lb 4.4 oz)  08/02/16 129.8 kg (286 lb 3.2 oz)  09/16/15 (!) 140.6 kg (310 lb)    Physical Exam:  BP 95/67 (BP Location: Left Arm)   Pulse 97   Temp 98.3 F (36.8 C) (Oral)   Resp 17   Wt (!) 142.1 kg (313 lb 4.4 oz)   SpO2 96%   BMI 39.16 kg/m  Constitutional: He appears well-developed. Obese. Vital signs reviewed.  HENT: Normocephalicand atraumatic.  Eyes: Conjunctivaeand EOMare normal.  Cardiovascular: Normal rateand regular rhythm.  Respiratory: Effort normaland breath sounds normal. No respiratory distress.  GI: Soft. He exhibits no distension. Musculoskeletal: No tenderness, no edema in extremities. Left knee effusion with tenderness, no erythema Right ankle pain with ROM but no tenderness or erythema No scrotal edema Neurological: He is alert.  Follows simple commands. Motor: B/l UE: 5/5 proximal to distal RLE: Hip flexion 3+/5, knee extension 4/5, ankle dorsi/plantar flexion 5/5  LLE: Hip flexion 3/5, knee extension 4-/5, ankle dorsi/plantar flexion 5/5 Skin: Skin is warmand dry.  Venous stasis changes b/l LE, R>L Rash - vesicular rash maturing right neck, spans about 6x8" area. Very sensitive to touch Psychiatric: He has a normal mood and affect. His behavior is  normal  Assessment/Plan: 1. Functional deficits secondary to cystitis/hydronephrosis which require 3+ hours per day of interdisciplinary therapy in a comprehensive inpatient rehab setting. Physiatrist is providing close team supervision and 24 hour management of active medical problems listed below. Physiatrist and rehab team continue to assess barriers to discharge/monitor patient progress toward functional and medical goals.  Function:  Bathing Bathing position   Position: Wheelchair/chair at sink  Bathing parts Body parts bathed by patient: Right arm, Left arm, Chest, Abdomen, Right upper leg, Left upper leg Body parts bathed by helper: Right lower leg, Left lower leg  Bathing assist Assist Level: Touching or steadying assistance(Pt > 75%)      Upper Body Dressing/Undressing Upper body dressing   What is the patient wearing?: Pull over shirt/dress     Pull over shirt/dress - Perfomed by patient: Thread/unthread right sleeve, Thread/unthread left sleeve, Put head through opening Pull over shirt/dress - Perfomed by helper: Pull shirt over trunk        Upper body assist Assist Level: Touching or steadying assistance(Pt > 75%)      Lower Body Dressing/Undressing Lower body dressing   What is the patient wearing?: Underwear, Pants (mesh underwear) Underwear - Performed by patient: Pull underwear up/down Underwear - Performed by helper: Thread/unthread right underwear leg, Thread/unthread left underwear leg Pants- Performed by patient: Pull pants up/down Pants- Performed by helper: Thread/unthread right pants leg, Thread/unthread left pants leg   Non-skid slipper socks- Performed by helper: Don/doff right sock,  Don/doff left sock               TED Hose - Performed by helper: Don/doff left TED hose, Don/doff right TED hose  Lower body assist Assist for lower body dressing: Touching or steadying assistance (Pt > 75%)      Toileting Toileting Toileting activity did not  occur: No continent bowel/bladder event Toileting steps completed by patient: Adjust clothing prior to toileting, Performs perineal hygiene, Adjust clothing after toileting Toileting steps completed by helper: Adjust clothing after toileting Toileting Assistive Devices: Grab bar or rail  Toileting assist Assist level: Supervision or verbal cues   Transfers Chair/bed transfer   Chair/bed transfer method: Stand pivot Chair/bed transfer assist level: Supervision or verbal cues Chair/bed transfer assistive device: Walker, Marine scientist lift: Ecologist Ambulation activity did not occur: Safety/medical concerns   Max distance: 159f  Assist level: Supervision or verbal cues   Wheelchair Wheelchair activity did not occur:  (New pt, will be evaluated by PT in AM) Type: Manual Max wheelchair distance: 1810f Assist Level: Supervision or verbal cues  Cognition Comprehension Comprehension assist level: Follows complex conversation/direction with extra time/assistive device  Expression Expression assist level: Expresses complex ideas: With extra time/assistive device  Social Interaction Social Interaction assist level: Interacts appropriately with others - No medications needed.  Problem Solving Problem solving assist level: Solves complex problems: Recognizes & self-corrects  Memory Memory assist level: Complete Independence: No helper     Medical Problem List and Plan: 1. Debilitationsecondary to cystitis/hydronephrosis  Cont CIR 2. DVT Prophylaxis/Anticoagulation: Subcutaneous heparin. Monitor platelet counts and any signs of bleeding 3. Pain Management: Ultram as needed 4. UTI/Aerococcus. Amoxicillin completed 8/30 5. Neuropsych: This patient iscapable of making decisions on hisown behalf. 6. Skin/Wound Care: Routine skin checks 7. Fluids/Electrolytes/Nutrition: Routine I&O's  Cont to encourage fluids, IVF on night of 8/31  AKI, Cr 1.20 on 9/1  (improving)  Labs  for Monday 8.CAD status post CABG. Discuss resumingaspirinwith Dr. HaTerrence Dupont.Acute on chronic anemia.   Hb 10.6 on 8/31  Will cont to monitor  Labs ordered for Monday 10.Hyperlipidemia. Resume Crestor 11.  Left knee effusion as well as right ankle joint pain: Improving   Uric acid WNL 8/24  ESR elevated 8/24--this may be due to shingles  Xrays reviewed, showing degenerative changes in ankle and effusion in knee.  Medrol dosepack 8/25-8/30 completed 12. HTN  Will monitor with increased activity  Lisinopril 5 started 8/26, increased to 10 on 8/27  BP's running quite low---hold today and resume at 55m27mose as indicated 13. Urinary incontinence  Foley placed by Urology on 8/21, d/ced 8/26 due to pain and swelling  Timed voiding   PVRs unremarkable  UA with bacteria, Ucx NG  Empiric macrobid started on 8/31, d/ced on 9/1     14. Bilateral complex hydroceles  U/S ordered and reviewed  Pain and edema has decreased after d/cing foley   Will need follow up as outpt 15. Leukocytosis  Likely steroid induced vs #16  Afebrile  WBCs 11.7 on 8/31, trending down  Will cont to monitor  Labs ordered for Monday 16. Shingles right neck  -pain substantially improved today  -at this point no indication for acyclovir given course/presentation -titrate gabapentin to 100m63md  -continue oxycodone for breakthrough pain  -continue topical rx's as already prescribed     LOS (Days) 10 A FACE TO FACE EVALUATION WAS PERFORMED  Sequoya Hogsett T 08/12/2016 8:33 AM

## 2016-08-12 NOTE — Progress Notes (Signed)
Physical Therapy Note  Patient Details  Name: [name] MRN: [id number] Date of Birth: 1931/03/20 Today's Date: 08/12/2016    Time: [id number] minutes  1:1 Pt with c/o neck and head pain due to shingles, RN aware.  Gait within room with close supervision with RW, standing balance and tolerance for washing face and brushing teeth with pt able to stand x 6 minutes before seated rest.  W/c mobility throughout unit for UE strength and endurance with pt able to propel 70' before needing seated rest.  Gait with RW in controlled environment 110', 220' with close supervision, cues for deep breathing.  Standing and seated therex for LE and core strengthening. Pt requires frequent rests but is motivated to do his best.   Isabelle Course 08/12/2016, 8:59 AM

## 2016-08-13 ENCOUNTER — Inpatient Hospital Stay (HOSPITAL_COMMUNITY): Payer: Medicare Other | Admitting: Occupational Therapy

## 2016-08-13 ENCOUNTER — Inpatient Hospital Stay (HOSPITAL_COMMUNITY): Payer: Medicare Other | Admitting: Physical Therapy

## 2016-08-13 DIAGNOSIS — E871 Hypo-osmolality and hyponatremia: Secondary | ICD-10-CM

## 2016-08-13 LAB — CBC WITH DIFFERENTIAL/PLATELET
BASOS ABS: 0 10*3/uL (ref 0.0–0.1)
BASOS PCT: 0 %
EOS PCT: 3 %
Eosinophils Absolute: 0.3 10*3/uL (ref 0.0–0.7)
HEMATOCRIT: 34.8 % — AB (ref 39.0–52.0)
HEMOGLOBIN: 11 g/dL — AB (ref 13.0–17.0)
LYMPHS ABS: 2.9 10*3/uL (ref 0.7–4.0)
LYMPHS PCT: 32 %
MCH: 27.9 pg (ref 26.0–34.0)
MCHC: 31.6 g/dL (ref 30.0–36.0)
MCV: 88.3 fL (ref 78.0–100.0)
MONOS PCT: 9 %
Monocytes Absolute: 0.8 10*3/uL (ref 0.1–1.0)
NEUTROS ABS: 5.1 10*3/uL (ref 1.7–7.7)
Neutrophils Relative %: 56 %
Platelets: 256 10*3/uL (ref 150–400)
RBC: 3.94 MIL/uL — ABNORMAL LOW (ref 4.22–5.81)
RDW: 16.3 % — ABNORMAL HIGH (ref 11.5–15.5)
WBC: 9.1 10*3/uL (ref 4.0–10.5)

## 2016-08-13 LAB — BASIC METABOLIC PANEL
ANION GAP: 8 (ref 5–15)
BUN: 30 mg/dL — AB (ref 6–20)
CHLORIDE: 107 mmol/L (ref 101–111)
CO2: 18 mmol/L — AB (ref 22–32)
Calcium: 9 mg/dL (ref 8.9–10.3)
Creatinine, Ser: 1.46 mg/dL — ABNORMAL HIGH (ref 0.61–1.24)
GFR calc Af Amer: 49 mL/min — ABNORMAL LOW (ref >60)
GFR calc non Af Amer: 43 mL/min — ABNORMAL LOW (ref >60)
GLUCOSE: 102 mg/dL — AB (ref 65–99)
POTASSIUM: 4.3 mmol/L (ref 3.5–5.1)
Sodium: 133 mmol/L — ABNORMAL LOW (ref 135–145)

## 2016-08-13 MED ORDER — SODIUM CHLORIDE 0.9 % IV BOLUS (SEPSIS)
1000.0000 mL | Freq: Once | INTRAVENOUS | Status: AC
  Administered 2016-08-13: 1000 mL via INTRAVENOUS

## 2016-08-13 MED ORDER — LIDOCAINE 5 % EX PTCH
1.0000 | MEDICATED_PATCH | CUTANEOUS | Status: DC
  Administered 2016-08-13 – 2016-08-15 (×3): 1 via TRANSDERMAL
  Filled 2016-08-13 (×3): qty 1

## 2016-08-13 NOTE — Progress Notes (Signed)
Occupational Therapy Session Note  Patient Details  Name: [name] MRN: [id number] Date of Birth: 12/30/1930  Today's Date: 08/13/2016   Session 1 OT Individual Time: 6720-9470 OT Individual Time Calculation (min): 59 min   Session 2 OT Individual Time: 9628-3662 OT Individual Time Calculation (min): 54 min     Short Term Goals: Week 2:  OT Short Term Goal 1 (Week 2): Pt will complete LB dressing with Min A OT Short Term Goal 2 (Week 2): Pt will complete functional shower transfer with supervision OT Short Term Goal 3 (Week 2): Pt will perform toileting with supervision OT Short Term Goal 4 (Week 2): Pt will maintain dynamic standing balance w/ RW at the sink while reaching to complete grooming task w/ supervision  Skilled Therapeutic Interventions/Progress Updates:    Session 1: Pt seen for skilled OT with focus on ADL/self-care management, functional shower/toilet transfers, and activity tolerance. Pt greeted in bed finishing breakfast as RN gave pt meds. Pt ambulated to the bathroom w/ RW and supervision. He required Min A to transfer onto [facility] over toilet 2/2 1 near posterior LOB. Pt unable to have Bm, and required Min A to stand. Pt then ambulated to the shower w/ RW and Min A to safely transfer onto tub-transfer bench. Pt completed bathing with overall Min A, and Mod A for LB dressing. Pt performed seated grooming tasks at the sink, then was left in w/c with needs met at end of session.   Session 2: Pt seen for second OT session with focus on activity tolerance, dynamic standing balance, and  standing endurance in preparation for ADL tasks. Pt reports 8/10 pain at neck where shingles located, but agreeable to therapy. Pt required Max A to don TED hose and socks stating it hurt his neck to bend down. Pt able to assisst 15% more when OT utilized chair to prop UE on. Pt ambulated in hallway with supervision and RW w/ 1 seated rest break. Pt brought to therapy gym where [employer]  was used to work on pt's standing endurance and balance. Pt able to tolerate 2 min standing bouts x3 with 3 min seated rest breaks in between. He was able to use unilateral UE support 75% and for 25% pt able to remove both UE's from RW ,and reach to all 4 quadrants across midline with supervision and 0 LOB. Pt ambulated back to room at end of session w/ RW and supervision. He was left seated in w/c with needs met.   Therapy Documentation Precautions:  Precautions Precautions: Fall Precaution Comments:   Restrictions Weight Bearing Restrictions: No Pain: Pain Assessment Pain Assessment: 0-10 Pain Score: 8  Pain Type: Acute pain Pain Location: Neck Pain Orientation: Right Pain Descriptors / Indicators: Burning;Aching Pain Onset: On-going Pain Intervention(s): Repositioned ADL: ADL ADL Comments: refer to functional navigator  See Function Navigator for Current Functional Status.   Therapy/Group: Individual Therapy  Valma Cava 08/13/2016, 2:57 PM

## 2016-08-13 NOTE — Progress Notes (Signed)
Physical Therapy Session Note  Patient Details  Name: [name] MRN: [id number] Date of Birth: 1931-06-05  Today's Date: 08/13/2016 PT Individual Time: 1105-1200 and [id number] PT Individual Time Calculation (min): 55 min and 12 min   Short Term Goals: Week 2:  PT Short Term Goal 1 (Week 2): LTG = STG due to Estimated d/c date.   Skilled Therapeutic Interventions/Progress Updates:    Treatment 1: Pt received in w/c & agreeable to treatment, noting 7-8/10 pain in neck & RN made aware. Gait training x 150 ft room>gym with RW & close supervision. Pt demonstrates decreased step length BLE, shuffled gait, and absent heel strike. Educated pt on need to increase BLE heel strike with poor demo by pt. Educated pt on single step negotiation, ~4 inches, to simulate home entry/exit. Pt negotiated 4" step with RW & supervision x 2 trials with max verbal cuing for technique. Pt completed 5x sit<>stand activity x2 trials without BUE support & min assist overall, from 22 inch surface, with activity focusing on BLE strengthening. Attempted to utilize [employer] to focus on weight shifting & balance but pt with BLE weakness & requesting to sit. Pt ambulated 150 ft back to room with RW & supervision in same manner as noted above. Pt performed standing hip flexion 1 set of 10 reps with BUE support for strengthening before requiring sitting break 2/2 fatigue. At end of session pt left sitting in w/c with all needs within reach, set up with lunch tray & RN present. Pt noted significant neck pain by end of session & RN aware.   Treatment 2: Pt received in w/c noting 10/10 pain in neck; RN made aware. Therapist provided various options regarding therapy session but pt noted too much pain. Pt agreeable to transfer w/c>recliner to increase comfort. Pt transferred sit<>stand and side stepped w/c>recliner with RW & supervision assist overall; pt required cuing to ambulate within base of RW with poor demo. At end of session pt  left in recliner with BLE elevated and all needs within reach.   Therapy Documentation Precautions:  Precautions Precautions: Fall Precaution Comments:   Restrictions Weight Bearing Restrictions: No   General: PT Amount of Missed Time (min): 18 Minutes PT Missed Treatment Reason: Pain   See Function Navigator for Current Functional Status.   Therapy/Group: Individual Therapy  [name] 08/13/2016, 12:11 PM

## 2016-08-13 NOTE — Progress Notes (Signed)
[facility] PHYSICAL MEDICINE & REHABILITATION     PROGRESS NOTE  Subjective/Complaints:  Pt seen laying in bed this AM.  He had some stomach pain yesterday evening, but that has resolved.    ROS:  Denies CP, SOB, N/V/D.  Objective: Vital Signs: Blood pressure 122/66, pulse 99, temperature 98.9 F (37.2 C), temperature source Oral, resp. rate 18, weight (!) 144.2 kg (318 lb), SpO2 96 %. No results found.  Recent Labs  08/13/16 0455  WBC 9.1  HGB 11.0*  HCT 34.8*  PLT 256    Recent Labs  08/13/16 0455  NA 133*  K 4.3  CL 107  GLUCOSE 102*  BUN 30*  CREATININE 1.46*  CALCIUM 9.0   CBG (last 3)  No results for input(s): GLUCAP in the last 72 hours.  Wt Readings from Last 3 Encounters:  08/13/16 (!) 144.2 kg (318 lb)  08/02/16 129.8 kg (286 lb 3.2 oz)  09/16/15 (!) 140.6 kg (310 lb)    Physical Exam:  BP 122/66 (BP Location: Right Arm)   Pulse 99   Temp 98.9 F (37.2 C) (Oral)   Resp 18   Wt (!) 144.2 kg (318 lb)   SpO2 96%   BMI 39.75 kg/m  Constitutional: He appears well-developed. Obese. Vital signs reviewed.  HENT: Normocephalicand atraumatic.  Eyes: Conjunctivaeand EOMare normal.  Cardiovascular: Normal rateand regular rhythm.  Respiratory: Effort normaland breath sounds normal. No respiratory distress.  GI: Soft. He exhibits no distension. Musculoskeletal: No tenderness, no edema in extremities. Left knee effusion with tenderness, no erythema Right ankle pain with ROM but no tenderness or erythema No scrotal edema Neurological: He is alert.  Follows simple commands. Motor: B/l UE: 5/5 proximal to distal RLE: Hip flexion 4/5, knee extension 4+/5, ankle dorsi/plantar flexion 5/5  LLE: Hip flexion 4-/5, knee extension 4/5, ankle dorsi/plantar flexion 5/5 Skin: Skin is warmand dry.  Venous stasis changes b/l LE, R>L Rash improving Psychiatric: He has a normal mood and affect. His behavior is normal  Assessment/Plan: 1. Functional deficits  secondary to cystitis/hydronephrosis which require 3+ hours per day of interdisciplinary therapy in a comprehensive inpatient rehab setting. Physiatrist is providing close team supervision and 24 hour management of active medical problems listed below. Physiatrist and rehab team continue to assess barriers to discharge/monitor patient progress toward functional and medical goals.  Function:  Bathing Bathing position   Position: Wheelchair/chair at sink  Bathing parts Body parts bathed by patient: Right arm, Left arm, Chest, Abdomen, Right upper leg, Left upper leg Body parts bathed by helper: Right lower leg, Left lower leg  Bathing assist Assist Level: Touching or steadying assistance(Pt > 75%)      Upper Body Dressing/Undressing Upper body dressing   What is the patient wearing?: Pull over shirt/dress     Pull over shirt/dress - Perfomed by patient: Thread/unthread right sleeve, Thread/unthread left sleeve, Put head through opening Pull over shirt/dress - Perfomed by helper: Pull shirt over trunk        Upper body assist Assist Level: Touching or steadying assistance(Pt > 75%)      Lower Body Dressing/Undressing Lower body dressing   What is the patient wearing?: Underwear, Pants (mesh underwear) Underwear - Performed by patient: Pull underwear up/down Underwear - Performed by helper: Thread/unthread right underwear leg, Thread/unthread left underwear leg Pants- Performed by patient: Pull pants up/down Pants- Performed by helper: Thread/unthread right pants leg, Thread/unthread left pants leg   Non-skid slipper socks- Performed by helper: Don/doff right sock, Don/doff  left sock               TED Hose - Performed by helper: Don/doff left TED hose, Don/doff right TED hose  Lower body assist Assist for lower body dressing: Touching or steadying assistance (Pt > 75%)      Toileting Toileting Toileting activity did not occur: No continent bowel/bladder event Toileting  steps completed by patient: Adjust clothing prior to toileting, Performs perineal hygiene, Adjust clothing after toileting Toileting steps completed by helper: Adjust clothing after toileting Toileting Assistive Devices: Grab bar or rail  Toileting assist Assist level: Supervision or verbal cues   Transfers Chair/bed transfer   Chair/bed transfer method: Stand pivot Chair/bed transfer assist level: Supervision or verbal cues Chair/bed transfer assistive device: Walker, Marine scientist lift: Ecologist Ambulation activity did not occur: Safety/medical concerns   Max distance: 146f  Assist level: Supervision or verbal cues   Wheelchair Wheelchair activity did not occur:  (New pt, will be evaluated by PT in AM) Type: Manual Max wheelchair distance: 1824f Assist Level: Supervision or verbal cues  Cognition Comprehension Comprehension assist level: Follows complex conversation/direction with no assist  Expression Expression assist level: Expresses complex ideas: With no assist  Social Interaction Social Interaction assist level: Interacts appropriately with others - No medications needed.  Problem Solving Problem solving assist level: Solves complex problems: Recognizes & self-corrects  Memory Memory assist level: Complete Independence: No helper     Medical Problem List and Plan: 1. Debilitationsecondary to cystitis/hydronephrosis  Cont CIR 2. DVT Prophylaxis/Anticoagulation: Subcutaneous heparin. Monitor platelet counts and any signs of bleeding 3. Pain Management: Ultram as needed 4. UTI/Aerococcus. Amoxicillin completed 8/30 5. Neuropsych: This patient iscapable of making decisions on hisown behalf. 6. Skin/Wound Care: Routine skin checks 7. Fluids/Electrolytes/Nutrition: Routine I&O's  Cont to encourage fluids, IVF on night of 8/31 again on 9/4  AKI, Cr 1.46 on 9/4, likely dehydration 8.CAD status post CABG. Discuss resumingaspirinwith Dr.  HaTerrence Dupont.Acute on chronic anemia.   Hb 10.6 on 8/31  Will cont to monitor  Labs ordered for Monday 10.Hyperlipidemia. Resume Crestor 11.  Left knee effusion as well as right ankle joint pain: Improving   Uric acid WNL 8/24  ESR elevated 8/24--this may be due to shingles  Xrays reviewed, showing degenerative changes in ankle and effusion in knee.  Medrol dosepack 8/25-8/30 completed 12. HTN  Will monitor with increased activity  Lisinopril 5 started 8/26, increased to 10 on 8/27, d/ced  Will cont to monitor and consider restarting if needed 13. Urinary incontinence  Foley placed by Urology on 8/21, d/ced 8/26 due to pain and swelling  Timed voiding   PVRs unremarkable  UA with bacteria, Ucx NG  Empiric macrobid started on 8/31, d/ced on 9/1  Urology input.  Appreciate recs.  14. Bilateral complex hydroceles  U/S ordered and reviewed  Pain and edema has decreased after d/cing foley   Will need follow up as outpt 15. Leukocytosis: Resolved  Likely steroid induced  Afebrile  Will cont to monitor 16. Right neck pash  -continue benadryl 17. Mild hyponatremia  Na+ 133 on 9/4 (stable)  Cont to monitor    LOS (Days) 11 A FACE TO FACE EVALUATION WAS PERFORMED  Ankit AnLorie Phenix/03/2016 7:53 AM

## 2016-08-13 NOTE — Progress Notes (Signed)
Physical Therapy Session Note  Patient Details  Name: JAYEN BROMWELL MRN: [id number] Date of Birth: 10-09-1931  Today's Date: 08/13/2016 PT Individual Time: 1500-1530 PT Individual Time Calculation (min): 30 min make up session   Short Term Goals: Week 2:  PT Short Term Goal 1 (Week 2): LTG = STG due to Estimated d/c date.   Skilled Therapeutic Interventions/Progress Updates:    Pt received resting in w/c with c/o neck pain but does not rate, agreeable to make up session with this PT.  Pt amb to and from therapy gym with RW and supervision with min verbal cues for heel strike and upright posture.  Nustep at level 3 x5 minutes and level 1 x3 minutes after rest break.  Pt used UEs and LEs for reciprocal stepping pattern and overall cardiopulmonary endurance with verbal cues for pacing, BORG rating at 13, and deep breathing.  Pt returned to room at end of session and positioned upright in w/c with call bell in reach and needs met.   Therapy Documentation Precautions:  Precautions Precautions: Fall Precaution Comments:   Restrictions Weight Bearing Restrictions: No   See Function Navigator for Current Functional Status.   Therapy/Group: Individual Therapy  Earnest Conroy Penven-Crew 08/13/2016, 3:33 PM

## 2016-08-13 NOTE — Progress Notes (Signed)
Social Work Patient ID: [name], male   DOB: [birth year], 82 y.o.   MRN: [id number]   Team feels pt will reach his goals sooner than next Sun, want to target Thursday 9/7 as his discharge day. Checked with MD he is Agreeable to this plan and feels he will be medically stable for discharge then. Spoke with pt and contacted wife via telephone to inform of the new plan. Both are agreeable and will target discharge Thursday. Will work  On discharge needs.

## 2016-08-14 ENCOUNTER — Inpatient Hospital Stay (HOSPITAL_COMMUNITY): Payer: Medicare Other | Admitting: Occupational Therapy

## 2016-08-14 ENCOUNTER — Inpatient Hospital Stay (HOSPITAL_COMMUNITY): Payer: Medicare Other | Admitting: Physical Therapy

## 2016-08-14 NOTE — Progress Notes (Signed)
Occupational Therapy Session Note  Patient Details  Name: [name] MRN: [id number] Date of Birth: 09/27/1931  Today's Date: 08/14/2016   Session 1: OT Individual Time: 0905-1000 OT Individual Time Calculation (min): 55 min   Session 2: OT Individual Time: 1307-1415 OT Individual Time Calculation (min): 68 min    Short Term Goals: Week 2:  OT Short Term Goal 1 (Week 2): Pt will complete LB dressing with Min A OT Short Term Goal 2 (Week 2): Pt will complete functional shower transfer with supervision OT Short Term Goal 3 (Week 2): Pt will perform toileting with supervision OT Short Term Goal 4 (Week 2): Pt will maintain dynamic standing balance w/ RW at the sink while reaching to complete grooming task w/ supervision  Skilled Therapeutic Interventions/Progress Updates:    Session 1 Pt seen for skilled OT with focus on self-care management, adaptive techniques and devices for ADLs, LB dressing, and standing endurance. Pt able to complete bathing in standing today with RW and supervision. Pt tolerated ~10 mins of standing ADL tasks without rest break and no LOB. Pt then completed LB dressing with supervision utilizing reacher and sock aid after demonstration from OT . Pt ambulated short distance in the hallway with RW and supervision, then returned to room and left seated in w/c with needs met and call bell within reach.   Session 2 Pt seen for second OT session with focus on increased activity tolerance, general strengthening/endurance, shower transfers, and LB dressing. Pt reported 8/10 pain, OT informed RN and pt was given pain medications. Pt completed sit<>stands and functional mobility with supervision and RW . Pt completed therapeutic exercises in the gym including arm-bike for 10 mins w/o rest break. Pt participated in dynamic standing activities in therapy apartment and performed walk-in shower transfers 3x with Min A progressing to supervision. Pt demonstrated improved safety  awareness, standing balance, and activity tolerance during transfer training. Pt ambulated back to room at end of session and OT assisted pt with donning TED hose. Pt able to don socks with sock-aid and Min instructional cues to utilize AE correctly. Pt left seated in recliner at end of session with needs met.   Therapy Documentation Precautions:  Precautions Precautions: Fall Precaution Comments:   Restrictions Weight Bearing Restrictions: No   Pain: Pain Assessment Pain Assessment: 0-10 Pain Score: 8  Pain Type: Neuropathic pain Pain Location: Neck Pain Orientation: Right Pain Descriptors / Indicators: Burning Pain Intervention(s): Ambulation/increased activity;Emotional support (RN administered pain meds) ADL: ADL ADL Comments: refer to functional navigator Exercises:   Other Treatments:    See Function Navigator for Current Functional Status.   Therapy/Group: Individual Therapy  Elisabeth S Doe 08/14/2016, 5:25 PM  

## 2016-08-14 NOTE — Progress Notes (Signed)
Social Work Patient ID: [name], male   DOB: May 02, 1931, 82 y.o.   MRN: [id number]  [place] with wife to schedule family education for tomorrow at 11:00 for PT session, that is the only one she felt she needed to see. 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 Will see if questions or concerns tomorrow.

## 2016-08-14 NOTE — Progress Notes (Signed)
[facility] PHYSICAL MEDICINE & REHABILITATION     PROGRESS NOTE  Subjective/Complaints:  Pt laying in bed this AM.  He states his neck continues to improve.  He also notes improvement in strength.    ROS:  Denies CP, SOB, N/V/D.  Objective: Vital Signs: Blood pressure 122/71, pulse 85, temperature 98 F (36.7 C), temperature source Oral, resp. rate 16, weight (!) 145.3 kg (320 lb 5.3 oz), SpO2 98 %. No results found.  Recent Labs  08/13/16 0455  WBC 9.1  HGB 11.0*  HCT 34.8*  PLT 256    Recent Labs  08/13/16 0455  NA 133*  K 4.3  CL 107  GLUCOSE 102*  BUN 30*  CREATININE 1.46*  CALCIUM 9.0   CBG (last 3)  No results for input(s): GLUCAP in the last 72 hours.  Wt Readings from Last 3 Encounters:  08/14/16 (!) 145.3 kg (320 lb 5.3 oz)  08/02/16 129.8 kg (286 lb 3.2 oz)  09/16/15 (!) 140.6 kg (310 lb)    Physical Exam:  BP 122/71 (BP Location: Right Arm)   Pulse 85   Temp 98 F (36.7 C) (Oral)   Resp 16   Wt (!) 145.3 kg (320 lb 5.3 oz)   SpO2 98%   BMI 40.04 kg/m  Constitutional: He appears well-developed. Obese. Vital signs reviewed.  HENT: Normocephalicand atraumatic.  Eyes: Conjunctivaeand EOMare normal.  Cardiovascular: Normal rateand regular rhythm.  Respiratory: Effort normaland breath sounds normal. No respiratory distress.  GI: Soft. He exhibits no distension. Musculoskeletal: No tenderness, no edema in extremities. Left knee effusion with tenderness, no erythema Right ankle pain with ROM but no tenderness or erythema No scrotal edema Neurological: He is alert.  Follows simple commands. Motor: B/l UE: 5/5 proximal to distal RLE: Hip flexion 4+/5, knee extension 4+/5, ankle dorsi/plantar flexion 5/5  LLE: Hip flexion 4+/5, knee extension 4+/5, ankle dorsi/plantar flexion 5/5 Skin: Skin is warmand dry.  Venous stasis changes b/l LE, R>L Rash improving Psychiatric: He has a normal mood and affect. His behavior is  normal  Assessment/Plan: 1. Functional deficits secondary to cystitis/hydronephrosis which require 3+ hours per day of interdisciplinary therapy in a comprehensive inpatient rehab setting. Physiatrist is providing close team supervision and 24 hour management of active medical problems listed below. Physiatrist and rehab team continue to assess barriers to discharge/monitor patient progress toward functional and medical goals.  Function:  Bathing Bathing position   Position: Shower  Bathing parts Body parts bathed by patient: Left arm, Right arm, Abdomen, Chest, Front perineal area, Right upper leg, Left upper leg Body parts bathed by helper: Buttocks, Left lower leg, Right lower leg, Back  Bathing assist Assist Level: Touching or steadying assistance(Pt > 75%)      Upper Body Dressing/Undressing Upper body dressing   What is the patient wearing?: Pull over shirt/dress     Pull over shirt/dress - Perfomed by patient: Thread/unthread left sleeve, Put head through opening, Thread/unthread right sleeve, Pull shirt over trunk Pull over shirt/dress - Perfomed by helper: Pull shirt over trunk        Upper body assist Assist Level: Supervision or verbal cues, Set up   Set up : To obtain clothing/put away  Lower Body Dressing/Undressing Lower body dressing   What is the patient wearing?: Ted Hose, Non-skid slipper socks Underwear - Performed by patient: Pull underwear up/down Underwear - Performed by helper: Thread/unthread right underwear leg, Thread/unthread left underwear leg Pants- Performed by patient: Pull pants up/down Pants- Performed by  helper: Thread/unthread right pants leg, Thread/unthread left pants leg   Non-skid slipper socks- Performed by helper: Don/doff right sock, Don/doff left sock               TED Hose - Performed by helper: Don/doff right TED hose, Don/doff left TED hose  Lower body assist Assist for lower body dressing: Touching or steadying assistance  (Pt > 75%)      Toileting Toileting Toileting activity did not occur: No continent bowel/bladder event Toileting steps completed by patient: Adjust clothing prior to toileting, Performs perineal hygiene, Adjust clothing after toileting Toileting steps completed by helper: Adjust clothing after toileting Toileting Assistive Devices: Grab bar or rail  Toileting assist Assist level: Supervision or verbal cues   Transfers Chair/bed transfer   Chair/bed transfer method: Ambulatory Chair/bed transfer assist level: Supervision or verbal cues Chair/bed transfer assistive device: Armrests, Environmental manager lift: Ecologist Ambulation activity did not occur: Safety/medical concerns   Max distance: 150 ft Assist level: Supervision or verbal cues   Wheelchair Wheelchair activity did not occur:  (New pt, will be evaluated by PT in AM) Type: Manual Max wheelchair distance: 18f  Assist Level: Supervision or verbal cues  Cognition Comprehension Comprehension assist level: Follows complex conversation/direction with extra time/assistive device  Expression Expression assist level: Expresses complex ideas: With extra time/assistive device  Social Interaction Social Interaction assist level: Interacts appropriately with others with medication or extra time (anti-anxiety, antidepressant).  Problem Solving Problem solving assist level: Solves complex problems: With extra time  Memory Memory assist level: Complete Independence: No helper     Medical Problem List and Plan: 1. Debilitationsecondary to cystitis/hydronephrosis  Cont CIR 2. DVT Prophylaxis/Anticoagulation: Subcutaneous heparin. Monitor platelet counts and any signs of bleeding 3. Pain Management: Ultram as needed 4. UTI/Aerococcus. Amoxicillin completed 8/30 5. Neuropsych: This patient iscapable of making decisions on hisown behalf. 6. Skin/Wound Care: Routine skin checks 7. Fluids/Electrolytes/Nutrition:  Routine I&O's  Cont to encourage fluids, IVF on night of 8/31 again on 9/4  AKI, Cr 1.46 on 9/4, likely dehydration 8.CAD status post CABG. Discuss resumingaspirinwith Dr. HTerrence Dupont9.Acute on chronic anemia.   Hb 11.0 on 9/4  Will cont to monitor 10.Hyperlipidemia. Resume Crestor 11.  Left knee effusion as well as right ankle joint pain: Improving   Uric acid WNL 8/24  ESR elevated 8/24--this may be due to shingles  Xrays reviewed, showing degenerative changes in ankle and effusion in knee.  Medrol dosepack 8/25-8/30 completed 12. HTN  Will monitor with increased activity  Lisinopril 5 started 8/26, increased to 10 on 8/27, d/ced  Will cont to monitor and consider restarting if needed 13. Urinary incontinence  Foley placed by Urology on 8/21, d/ced 8/26 due to pain and swelling  Timed voiding   PVRs unremarkable  UA with bacteria, Ucx NG  Empiric macrobid started on 8/31, d/ced on 9/1  Urology input, awaiting recs.  14. Bilateral complex hydroceles  U/S ordered and reviewed  Pain and edema has decreased after d/cing foley   Will need follow up as outpt 15. Leukocytosis: Resolved  Likely steroid induced  Afebrile  Will cont to monitor 16. Right neck pash  continue benadryl  Continues to improve 17. Mild hyponatremia  Na+ 133 on 9/4 (stable)  Cont to monitor    LOS (Days) 12 A FACE TO FACE EVALUATION WAS PERFORMED  Ankit ALorie Phenix9/04/2016 8:22 AM

## 2016-08-14 NOTE — Progress Notes (Signed)
Physical Therapy Session Note  Patient Details  Name: [name] MRN: [id number] Date of Birth: 1931-03-20  Today's Date: 08/14/2016 PT Individual Time: 1104-1201 PT Individual Time Calculation (min): 57 min    Skilled Therapeutic Interventions/Progress Updates:    Pt received in recliner & agreeable to treatment, noting 7-8/10 neck pain & RN made aware. Pt reported no concerns regarding d/c Thursday. Throughout session pt performed all sit<>stand transfers with supervision, requiring cuing for hand placement for increased safety with task. Pt ambulated >300 ft throughout unit over even surface & low carpet with RW & supervision for endurance training; therapist provided cuing for increased heel strike BLE with fair/poor demo by pt. Utilized cybex kinetron in sitting at 60 cm/sec for BLE strengthening. Stair negotiation completed, x 8 steps + 4 steps  (6") with B rails for LE strengthening. Educated pt on negotiating obstacles laterally with RW & pt able to return demonstrate with supervision. At end of session pt left sitting in recliner with all needs within reach & set up with lunch tray.  Therapy Documentation Precautions:  Precautions Precautions: Fall Precaution Comments:   Restrictions Weight Bearing Restrictions: No   See Function Navigator for Current Functional Status.   Therapy/Group: Individual Therapy  [name] 08/14/2016, 12:24 PM

## 2016-08-15 ENCOUNTER — Inpatient Hospital Stay (HOSPITAL_COMMUNITY): Payer: Medicare Other

## 2016-08-15 ENCOUNTER — Ambulatory Visit (HOSPITAL_COMMUNITY): Payer: Medicare Other | Admitting: Physical Therapy

## 2016-08-15 ENCOUNTER — Inpatient Hospital Stay (HOSPITAL_COMMUNITY): Payer: Medicare Other | Admitting: Physical Therapy

## 2016-08-15 ENCOUNTER — Inpatient Hospital Stay (HOSPITAL_COMMUNITY): Payer: Medicare Other | Admitting: Occupational Therapy

## 2016-08-15 DIAGNOSIS — M792 Neuralgia and neuritis, unspecified: Secondary | ICD-10-CM

## 2016-08-15 LAB — URINALYSIS, ROUTINE W REFLEX MICROSCOPIC
Bilirubin Urine: NEGATIVE
GLUCOSE, UA: NEGATIVE mg/dL
Ketones, ur: NEGATIVE mg/dL
Nitrite: POSITIVE — AB
PH: 6 (ref 5.0–8.0)
PROTEIN: 100 mg/dL — AB
Specific Gravity, Urine: 1.017 (ref 1.005–1.030)

## 2016-08-15 LAB — CBC
HCT: 35.5 % — ABNORMAL LOW (ref 39.0–52.0)
Hemoglobin: 11.3 g/dL — ABNORMAL LOW (ref 13.0–17.0)
MCH: 28.3 pg (ref 26.0–34.0)
MCHC: 31.8 g/dL (ref 30.0–36.0)
MCV: 88.8 fL (ref 78.0–100.0)
Platelets: 251 10*3/uL (ref 150–400)
RBC: 4 MIL/uL — ABNORMAL LOW (ref 4.22–5.81)
RDW: 16.3 % — AB (ref 11.5–15.5)
WBC: 9.9 10*3/uL (ref 4.0–10.5)

## 2016-08-15 LAB — URINE MICROSCOPIC-ADD ON

## 2016-08-15 MED ORDER — TRAMADOL HCL 50 MG PO TABS
50.0000 mg | ORAL_TABLET | Freq: Three times a day (TID) | ORAL | Status: DC
  Administered 2016-08-15 – 2016-08-16 (×5): 50 mg via ORAL
  Filled 2016-08-15 (×5): qty 1

## 2016-08-15 MED ORDER — GABAPENTIN 300 MG PO CAPS
300.0000 mg | ORAL_CAPSULE | Freq: Three times a day (TID) | ORAL | Status: DC
  Administered 2016-08-15 – 2016-08-16 (×3): 300 mg via ORAL
  Filled 2016-08-15 (×3): qty 1

## 2016-08-15 MED ORDER — TRAMADOL HCL 50 MG PO TABS: 50.0000 mg | ORAL_TABLET | Freq: Four times a day (QID) | ORAL | Status: DC | PRN

## 2016-08-15 NOTE — Plan of Care (Signed)
Problem: RH PAIN MANAGEMENT Goal: RH STG PAIN MANAGED AT OR BELOW PT'S PAIN GOAL 4 or less  Outcome: Not Met (add Reason) Patient still has pain to blistered areas to the right neck & posterior head. Pain medications were changed & scheduled.  Comments: Patient still has pain to the back of the head & neck region where he has the blistered areas. Blisters are drying & most of the scabbing that was there is gone. He states less pain to the areas. Pain medications were changed & scheduled

## 2016-08-15 NOTE — Discharge Instructions (Signed)
Inpatient Rehab Discharge Instructions  [facility] Discharge date and time: No discharge date for patient encounter.   Activities/Precautions/ Functional Status: Activity: activity as tolerated Diet: regular diet Wound Care: none needed Functional status:  ___ No restrictions     ___ Walk up steps independently ___ 24/7 supervision/assistance   ___ Walk up steps with assistance ___ Intermittent supervision/assistance  ___ Bathe/dress independently ___ Walk with walker     _x__ Bathe/dress with assistance ___ Walk Independently    ___ Shower independently ___ Walk with assistance    ___ Shower with assistance ___ No alcohol     ___ Return to work/school ________  Special Instructions:  Follow-up with [name] on plans on resuming low-dose aspirin  Follow-up with Dr. follow-up with [facility] urology services 1 week after discharge   [place]:    Home Health:   [facility]   Date of last service:08/16/2016  Medical Equipment/Items Ordered:[facility] [place]    [id number]    My questions have been answered and I understand these instructions. I will adhere to these goals and the provided educational materials after my discharge from the hospital.  Patient/Caregiver Signature _______________________________ Date __________  Clinician Signature _______________________________________ Date __________  Please bring this form and your medication list with you to all your follow-up doctor's appointments.

## 2016-08-15 NOTE — Progress Notes (Signed)
Physical Therapy Discharge Summary  Patient Details  Name: [name] MRN: [id number] Date of Birth: Jul 10, 1931  Today's Date: 08/15/2016 PT Individual Time: 1100-1200 AND 1400-1426 PT Individual Time Calculation (min): 60 min AND 26 min     Patient has met 10 of 10 long term goals due to improved activity tolerance, improved balance, improved postural control, increased strength, increased range of motion and decreased pain.  Patient to discharge at an ambulatory level Supervision.   Patient's care partner is independent to provide the necessary physical assistance at discharge.    Recommendation:  Patient will benefit from ongoing skilled PT services in home health setting to continue to advance safe functional mobility, address ongoing impairments in Balance, strength, endurance, gait, and minimize fall risk.  Equipment: RW  Reasons for discharge: treatment goals met and discharge from hospital  Patient/family agrees with progress made and goals achieved: Yes   TREATMENT;   PT instructed patient in grad day assessment; see below for results.  PT also instructed patient in car transfer with stand pivot technique with mod cues to prevent SLS into car. Gait training instructed over ramp and uneven surface with RW. Min cues for AD management.   Patient performed gait training in simulated home environment for 17f with RW and supervision A for onstacle navigation.   Patient left sitting in recliner with call bell in reach .   Session 2:  Patient received sitting in recliner and agreeable to PT. Gait training instructed by PT for 169fx 2 with supervision Assist   Nustep endurance training x 10 minutes, level 2>5, mod cues for improved speed and increased participation with increased time.   Patient performed  Sit<>stand and stand pivot transfers with RW and supervision A x 6 throughout treatment.   Patient returned to room and left sitting in recliner with call bell in  reach.       PT Discharge Precautions/Restrictions  Vital Signs   Pain Pain Assessment Pain Assessment: 0-10 Pain Score: 4  Pain Type: Acute pain Pain Location: Neck Pain Orientation: Right Pain Descriptors / Indicators: Burning Patients Stated Pain Goal: 3 Pain Intervention(s): Medication (See eMAR) Vision/Perception      Cognition Overall Cognitive Status: Within Functional Limits for tasks assessed Orientation Level: Oriented X4 Memory: Appears intact Awareness: Appears intact Problem Solving: Appears intact Safety/Judgment: Appears intact Sensation Sensation Light Touch: Appears Intact Proprioception: Appears Intact Coordination Gross Motor Movements are Fluid and Coordinated: Yes Fine Motor Movements are Fluid and Coordinated: Yes [facility]el [facility]     WFL Mobility Bed Mobility Bed Mobility: Rolling Right;Rolling Left;Supine to Sit;Sit to Supine Rolling Right: 6: Modified independent (Device/Increase time) Rolling Left: 6: Modified independent (Device/Increase time) Supine to Sit: 6: Modified independent (Device/Increase time) Sit to Supine: 6: Modified independent (Device/Increase time) Transfers Transfers: Yes Sit to Stand: 6: Modified independent (Device/Increase time) (with RW) Stand Pivot Transfers: 6: Modified independent (Device/Increase time) Locomotion  Ambulation Ambulation: Yes Ambulation/Gait Assistance: 5: Supervision Ambulation Distance (Feet): 250 Feet Assistive device: Rolling walker Ambulation/Gait Assistance Details: Verbal cues for technique Gait Gait: Yes Gait Pattern: Impaired Stairs / Additional Locomotion Stairs: Yes Stairs Assistance: 5: Supervision Stairs Assistance Details: Verbal cues for gait pattern Stair Management Technique: Two rails Number of Stairs: 12 Height of Stairs: 6 Ramp: 5: Supervision Curb: 5: Supervision Wheelchair Mobility Wheelchair Mobility: No  Trunk/Postural Assessment  Cervical  Assessment Cervical Assessment: Within Functional Limits (slight gaurding due to pain in neck) Thoracic Assessment Thoracic Assessment: Within Functional  Limits Lumbar Assessment Lumbar Assessment: Exceptions to [facility] (posterior pelvic tilt. ) Postural Control Postural Control: Within Functional Limits  Balance Balance Balance Assessed: Yes Static Sitting Balance Static Sitting - Balance Support: No upper extremity supported Static Sitting - Level of Assistance: 6: Modified independent (Device/Increase time) Dynamic Sitting Balance Dynamic Sitting - Balance Support: No upper extremity supported Dynamic Sitting - Level of Assistance: 6: Modified independent (Device/Increase time) Static Standing Balance Static Standing - Balance Support: No upper extremity supported Static Standing - Level of Assistance: 5: Stand by assistance Dynamic Standing Balance Dynamic Standing - Balance Support: No upper extremity supported Dynamic Standing - Level of Assistance: 5: Stand by assistance Extremity Assessment      RLE Assessment RLE Assessment: Within Functional Limits (WFL AROM, 5/5 proximal to distal except hip flexion 4+/5) LLE Assessment LLE Assessment: Within Functional Limits LLE AROM (degrees) LLE Overall AROM Comments: WfL LLE Strength LLE Overall Strength Comments: 5/5 proximal to distal except hip flexion 4/5.    See Function Navigator for Current Functional Status.  [name] 08/15/2016, 2:58 PM

## 2016-08-15 NOTE — Progress Notes (Signed)
Social Work [name], LCSW Social Worker Signed   Patient Care Conference Date of Service: 08/15/2016  2:49 PM      Hide copied text Hover for attribution information Inpatient RehabilitationTeam Conference and Plan of Care Update Date: 08/15/2016   Time: 2:00 PM      Patient Name: [name]      Medical Record Number: [id number]  Date of Birth: 14-Nov-1931 Sex: Male         Room/Bed: 4M03C/4M03C-01 Payor Info: Payor: MEDICARE / Plan: MEDICARE PART A AND B / Product Type: *No Product type* /     Admitting Diagnosis: Debility  Admit Date/Time:  08/02/2016  9:15 PM Admission Comments: No comment available    Primary Diagnosis:  <principal problem not specified> Principal Problem: <principal problem not specified>       Patient Active Problem List    Diagnosis Date Noted  . Neuropathic pain    . Contact dermatitis    . AKI (acute kidney injury) ([place])    . Recurrent UTI    . Leukocytosis    . Absence of bladder continence    . Pain    . Hydrocele in adult    . Acute cystitis with hematuria    . Acute blood loss anemia    . Anemia of chronic disease    . Other secondary hypertension    . Acute idiopathic gout of left knee    . Urinary retention    . Debilitated 08/02/2016  . Bilateral hydronephrosis    . Normochromic normocytic anemia    . E-coli UTI    . Benign essential HTN    . Coronary artery disease involving coronary bypass graft of native heart without angina pectoris    . BPH (benign prostatic hyperplasia)    . Kidney stones    . CKD (chronic kidney disease)    . Morbid obesity due to excess calories ([place])    . Acute renal failure (ARF) ([place]) 07/29/2016      Expected Discharge Date: Expected Discharge Date: 08/16/16   Team Members Present: Physician leading conference: Dr. Delice Lesch Social Worker Present: Ovidio Kin, LCSW Nurse Present: Heather Roberts, RN PT Present: Barrie Folk, PT;Victoria Sabra Heck, PT OT Present: Willeen Cass, OT;Other  (comment) Grayland Ormond Doe-OT) [place] Coordinator present : Daiva Nakayama, RN, CRRN       Current Status/Progress Goal Weekly Team Focus  Medical   Debilitation secondary to cystitis/hydronephrosis now with rash  Improve safety, endurance, pain, bladder symptoms, rash  See above   Bowel/Bladder   Continent of bowel last BM 9/5. Condom catheter at night.   Remain continent at all times for bladder. Regular BM.   Monitor for bladder and bowel q shift and prn.   Swallow/Nutrition/ Hydration             ADL's   Min A LB dressing, supervision sit<>stands, supervision toilet and shower transfers  min A LB dressing, supervision functional transfers, supervision toileting  pt and family ed, ADL training, activity tolerance   Mobility   supervision for single step negotiation with RW, supervision for ambulation with RW, shuffled gait, supervision for standing balance with RW, limited by neck pain  supervision for ambulation with LRAD, supervision for single step negotiation with LRAD  pt education, endurance training, gait training, bed mobility   Communication             Safety/Cognition/ Behavioral Observations           Pain  Pain to right neck. Lidoderm patch to site. Oxyir 5mg  prn.   Pain less than or equal to 2.  Monitor for pain q shift and prn.   Skin   Blisters to right neck area drying up. On lidocaine and benadryl cream prn.  No new skin breakdown.  Monitor skin q shift and prn.       *See Care Plan and progress notes for long and short-term goals.   Barriers to Discharge: ABLA, AKI, neck pain, urinary incontinence, hydroceles, neck rash    Possible Resolutions to Barriers:  Follow labs, therapies, urology as oupt, workup for neck rash, ?VZV, hydoceles, AKI   Discharge Planning/Teaching Needs:  Wife coming in today for family education did help him with bathing and drssing prior to admission. Prepare for DC tomorrow.      Team Discussion:  Progressing toward his goals of supervision  level. Wife was in for family education today. Voiding now and not retaining, will wear condom cath at night due to urgency. Rash less painful. Some blood in his urine-MD aware. Set for DC tomorrow.  Revisions to Treatment Plan:  DC tmorrow    Continued Need for Acute Rehabilitation Level of Care: The patient requires daily medical management by a physician with specialized training in physical medicine and rehabilitation for the following conditions: Daily direction of a multidisciplinary physical rehabilitation program to ensure safe treatment while eliciting the highest outcome that is of practical value to the patient.: Yes Daily medical management of patient stability for increased activity during participation in an intensive rehabilitation regime.: Yes Daily analysis of laboratory values and/or radiology reports with any subsequent need for medication adjustment of medical intervention for : Renal problems;Urological problems;Other   Layna Roeper, Gardiner Rhyme 08/15/2016, 2:49 PM      [name], LCSW Social Worker Signed   Patient Care Conference Date of Service: 08/08/2016  2:05 PM      Hide copied text Hover for attribution information Inpatient RehabilitationTeam Conference and Plan of Care Update Date: 08/08/2016   Time: 11:25 AM      Patient Name: [name]      Medical Record Number: [id number]  Date of Birth: [birth year] Sex: Male         Room/Bed: 4W03C/4W03C-01 Payor Info: Payor: MEDICARE / Plan: MEDICARE PART A AND B / Product Type: *No Product type* /     Admitting Diagnosis: Debility  Admit Date/Time:  08/02/2016  9:15 PM Admission Comments: No comment available    Primary Diagnosis:  <principal problem not specified> Principal Problem: <principal problem not specified>       Patient Active Problem List    Diagnosis Date Noted  . Leukocytosis    . Absence of bladder continence    . Pain    . Hydrocele in adult    . Acute cystitis with hematuria    . Acute  blood loss anemia    . Anemia of chronic disease    . Other secondary hypertension    . Acute idiopathic gout of left knee    . Urinary retention    . Debilitated 08/02/2016  . Bilateral hydronephrosis    . Normochromic normocytic anemia    . E-coli UTI    . Benign essential HTN    . Coronary artery disease involving coronary bypass graft of native heart without angina pectoris    . BPH (benign prostatic hyperplasia)    . Kidney stones    . CKD (chronic kidney disease)    .  Morbid obesity due to excess calories ([place])    . Acute renal failure (ARF) ([place]) 07/29/2016      Expected Discharge Date: Expected Discharge Date: 08/19/16   Team Members Present: Physician leading conference: Dr. Delice Lesch Social Worker Present: Ovidio Kin, LCSW Nurse Present: Heather Roberts, RN PT Present: Barrie Folk, PT;Victoria Sabra Heck, PT OT Present: Willeen Cass, OT;Other (comment) Grayland Ormond Doe-OT) SLP Present: Gunnar Fusi, SLP [place] Coordinator present : Daiva Nakayama, RN, CRRN       Current Status/Progress Goal Weekly Team Focus  Medical   Debilitation secondary to cystitis/hydronephrosis with poor endrance  Improve safety, endurance, bladder symptoms  See above   Bowel/Bladder   constant dribbling of urine- condom cath @ night, continent of bowel, last bm 8/28  better urine control, regular bms  monitor   Swallow/Nutrition/ Hydration             ADL's   Max A LB dressing, Min A stand, Mod A bathing, Max A toileting  Min A LB dressing, Min A toileting, Min A standing balance, Min A transfers  Activity tolerance, standing balance, LB dressing, functional transfers   Mobility   supervision bed mobility with hospital bed features, min A for stand pivot transfers with RW, ambulates a maximum of 27 ft with Min A + w/c follow for safety, continues to be limited by LLE pain  goals upgraded to supervision for ambulation with LRAD, supervision for transfers, mod I for bed mobility from traditional bed   endurance training, gait training, bed mobility & transfer training, pt education   Communication             Safety/Cognition/ Behavioral Observations           Pain   tramadol q6 prn adequate  little to no pain  assess & offer pain meds   Skin   intact  remain free of breakdown or infection  monitor       *See Care Plan and progress notes for long and short-term goals.   Barriers to Discharge: Anemia, HTN, Gout, Urinary incontinence, leukocytosis, hydroceles   Possible Resolutions to Barriers:  Follow labs, finishing medrol dose pack, therapies, urology as oupt, optimize BP meds   Discharge Planning/Teaching Needs:  Home with wife who can assist with his care, he wants to be as independent as psosible before leaving here.      Team Discussion:  Goals supervision level. Endurance and activity tolerance working on in therapies. Checking UA today and may need Urol consult before leaves CIR. BP issues today. Pain in calf MD addressing. Pt does not tend to push himself in therapies  Revisions to Treatment Plan:  Upgraded goals to supervision level    Continued Need for Acute Rehabilitation Level of Care: The patient requires daily medical management by a physician with specialized training in physical medicine and rehabilitation for the following conditions: Daily direction of a multidisciplinary physical rehabilitation program to ensure safe treatment while eliciting the highest outcome that is of practical value to the patient.: Yes Daily medical management of patient stability for increased activity during participation in an intensive rehabilitation regime.: Yes Daily analysis of laboratory values and/or radiology reports with any subsequent need for medication adjustment of medical intervention for : Blood pressure problems;Renal problems;Urological problems   [name] 08/08/2016, 2:05 PM       Patient ID: KUNTE CARDA, male   DOB: [birth year], 82 y.o.   MRN:  [id number]

## 2016-08-15 NOTE — Discharge Summary (Signed)
Discharge summary job [id number]

## 2016-08-15 NOTE — Progress Notes (Signed)
Social Work Patient ID: [name], male   DOB: 1931/09/28, 82 y.o.   MRN: [id number]   Wife is here to attend therapies with pt and reports it is going well. Pt has made good progress and is ready to go home tomorrow. She is aware of the team's recommendation for 24 hr supervision. She feels he is doing better now than he was moving at home. Prepare for discharge tomorrow.

## 2016-08-15 NOTE — Progress Notes (Signed)
Occupational Therapy Session Note  Patient Details  Name: [name] MRN: [id number] Date of Birth: [birth year]  Today's Date: 08/15/2016 OT Individual Time: 0930-1015 OT Individual Time Calculation (min): 45 min     Short Term Goals: Week 2:  OT Short Term Goal 1 (Week 2): Pt will complete LB dressing with Min A OT Short Term Goal 2 (Week 2): Pt will complete functional shower transfer with supervision OT Short Term Goal 3 (Week 2): Pt will perform toileting with supervision OT Short Term Goal 4 (Week 2): Pt will maintain dynamic standing balance w/ RW at the sink while reaching to complete grooming task w/ supervision  Skilled Therapeutic Interventions/Progress Updates:    Pt engaged in BADL retraining including bathing while standing at sink (except for feet) and dressing with sit<>stand from w/c.  Pt also engaged in simulated tub transfer by stepping over into tub with use of grab bars.  Pt amb with RW to ADL apartment and gym before returning to room.  Pt did not required any physical assistance and did not exhibit any unsafe behaviors.  Pt returned to room and remained in recliner with all needs within reach.  Focus on activity tolerance, sit<>stand, standing balance, functional amb with RW, and safety awareness to increase independence with BADLs.  Therapy Documentation Precautions:  Precautions Precautions: Fall Precaution Comments:   Restrictions Weight Bearing Restrictions: No   Pain: Pain Assessment Pain Assessment: 0-10 Pain Score: 5  Pain Type: Acute pain Pain Location: Neck Pain Orientation: Right Pain Descriptors / Indicators: Burning Patients Stated Pain Goal: 3 Pain Intervention(s):RN aware  ADL: ADL ADL Comments: refer to functional navigator  See Function Navigator for Current Functional Status.   Therapy/Group: Individual Therapy  Leroy Libman 08/15/2016, 12:15 PM

## 2016-08-15 NOTE — Patient Care Conference (Signed)
Inpatient RehabilitationTeam Conference and Plan of Care Update Date: 08/15/2016   Time: 2:00 PM    Patient Name: [name]      Medical Record Number: [id number]  Date of Birth: 03-09-1931 Sex: Male         Room/Bed: 4M03C/4M03C-01 Payor Info: Payor: MEDICARE / Plan: MEDICARE PART A AND B / Product Type: *No Product type* /    Admitting Diagnosis: Debility  Admit Date/Time:  08/02/2016  9:15 PM Admission Comments: No comment available   Primary Diagnosis:  <principal problem not specified> Principal Problem: <principal problem not specified>  Patient Active Problem List   Diagnosis Date Noted  . Neuropathic pain   . Contact dermatitis   . AKI (acute kidney injury) ([place])   . Recurrent UTI   . Leukocytosis   . Absence of bladder continence   . Pain   . Hydrocele in adult   . Acute cystitis with hematuria   . Acute blood loss anemia   . Anemia of chronic disease   . Other secondary hypertension   . Acute idiopathic gout of left knee   . Urinary retention   . Debilitated 08/02/2016  . Bilateral hydronephrosis   . Normochromic normocytic anemia   . E-coli UTI   . Benign essential HTN   . Coronary artery disease involving coronary bypass graft of native heart without angina pectoris   . BPH (benign prostatic hyperplasia)   . Kidney stones   . CKD (chronic kidney disease)   . Morbid obesity due to excess calories ([place])   . Acute renal failure (ARF) ([place]) 07/29/2016    Expected Discharge Date: Expected Discharge Date: 08/16/16  Team Members Present: Physician leading conference: Dr. Delice Lesch Social Worker Present: Ovidio Kin, LCSW Nurse Present: Heather Roberts, RN PT Present: Barrie Folk, PT;Victoria Sabra Heck, PT OT Present: Willeen Cass, OT;Other (comment) Grayland Ormond Doe-OT) [place] Coordinator present : Daiva Nakayama, RN, CRRN     Current Status/Progress Goal Weekly Team Focus  Medical   Debilitation secondary to cystitis/hydronephrosis now with rash  Improve  safety, endurance, pain, bladder symptoms, rash  See above   Bowel/Bladder   Continent of bowel last BM 9/5. Condom catheter at night.   Remain continent at all times for bladder. Regular BM.   Monitor for bladder and bowel q shift and prn.   Swallow/Nutrition/ Hydration             ADL's   Min A LB dressing, supervision sit<>stands, supervision toilet and shower transfers  min A LB dressing, supervision functional transfers, supervision toileting  pt and family ed, ADL training, activity tolerance   Mobility   supervision for single step negotiation with RW, supervision for ambulation with RW, shuffled gait, supervision for standing balance with RW, limited by neck pain  supervision for ambulation with LRAD, supervision for single step negotiation with LRAD  pt education, endurance training, gait training, bed mobility   Communication             Safety/Cognition/ Behavioral Observations            Pain   Pain to right neck. Lidoderm patch to site. Oxyir 5mg  prn.   Pain less than or equal to 2.  Monitor for pain q shift and prn.   Skin   Blisters to right neck area drying up. On lidocaine and benadryl cream prn.  No new skin breakdown.  Monitor skin q shift and prn.      *See Care Plan and progress notes  for long and short-term goals.  Barriers to Discharge: ABLA, AKI, neck pain, urinary incontinence, hydroceles, neck rash     Possible Resolutions to Barriers:  Follow labs, therapies, urology as oupt, workup for neck rash, ?VZV, hydoceles, AKI    Discharge Planning/Teaching Needs:  Wife coming in today for family education did help him with bathing and drssing prior to admission. Prepare for DC tomorrow.      Team Discussion:  Progressing toward his goals of supervision level. Wife was in for family education today. Voiding now and not retaining, will wear condom cath at night due to urgency. Rash less painful. Some blood in his urine-MD aware. Set for DC tomorrow.  Revisions  to Treatment Plan:  DC tmorrow   Continued Need for Acute Rehabilitation Level of Care: The patient requires daily medical management by a physician with specialized training in physical medicine and rehabilitation for the following conditions: Daily direction of a multidisciplinary physical rehabilitation program to ensure safe treatment while eliciting the highest outcome that is of practical value to the patient.: Yes Daily medical management of patient stability for increased activity during participation in an intensive rehabilitation regime.: Yes Daily analysis of laboratory values and/or radiology reports with any subsequent need for medication adjustment of medical intervention for : Renal problems;Urological problems;Other  [name] 08/15/2016, 2:49 PM

## 2016-08-15 NOTE — Progress Notes (Signed)
Occupational Therapy Discharge Summary  Patient Details  Name: [name] MRN: [id number] Date of Birth: 1931/02/19  Today's Date: 08/15/2016 OT Individual Time: 1300-1400 OT Individual Time Calculation (min): 60 min     Patient has met 6 of 6 long term goals due to improved activity tolerance, improved balance, improved general strength, ability to compensate for deficits and increased independence with bathing, dressing, and toileting.  Patient to discharge at overall Supervision level.  Patient's care partner is independent to provide the necessary physical assistance at discharge.  Recommendation:  Patient will benefit from ongoing skilled OT services in home health setting to continue to advance functional skills in the area of BADL.  Equipment: rolling walker, sock-aid, reacher  Reasons for discharge: treatment goals met and discharge from hospital  Patient/family agrees with progress made and goals achieved: Yes   OT Treatment session: Pt seen for skilled OT with focus on home activity modifications, pt/family education, activity tolerance/endurance, general strengthening, safety awareness, and ADL AE . Pts spouse present for beginning of OT session. OT reviewed functional tub and toilet transfers and home safety; pt and spouse verbalized understanding. Pt completed LB dressing using sock-aid and reacher, then ambulated in the hallway to therapy gym without rest break. Pt participated in 10 mins on nu-step without rest break, then performed dynamic standing activities reaching outside base of support without LOB. Pt returned to room at end of session and left in recliner with needs met.    OT Discharge Precautions/Restrictions Precautions Precautions: Fall Restrictions Weight Bearing Restrictions: No   Pain Pain Assessment Pain Assessment: 0-10 Pain Score: 8 Pain location: neck Pain Interventions: ambulation/incresaed activity, RN to administer pain meds  ADL ADL ADL  Comments: refer to functional navigator Vision/Perception     Cognition Overall Cognitive Status: Within Functional Limits for tasks assessed Arousal/Alertness: Awake/alert Orientation Level: Oriented X4 Memory: Appears intact Awareness: Appears intact Problem Solving: Appears intact Safety/Judgment: Appears intact Sensation Sensation Light Touch: Appears Intact Proprioception: Appears Intact Coordination Gross Motor Movements are Fluid and Coordinated: Yes Fine Motor Movements are Fluid and Coordinated: Yes Heel Shin Test: [facility]   Mobility  Transfers Sit to Stand: 6: Modified independent (Device/Increase time)  Trunk/Postural Assessment  Postural Control Postural Control: Within Functional Limits  Balance Balance Balance Assessed: Yes Dynamic Sitting Balance Dynamic Sitting - Balance Support: No upper extremity supported;During functional activity Dynamic Sitting - Level of Assistance: 6: Modified independent (Device/Increase time) Dynamic Standing Balance Dynamic Standing - Balance Support: No upper extremity supported Dynamic Standing - Level of Assistance: 5: Stand by assistance Extremity/Trunk Assessment RUE Assessment RUE Assessment: Within Functional Limits LUE Assessment LUE Assessment: Within Functional Limits   See Function Navigator for Current Functional Status.  Daneen Schick Maxwell Lemen 08/15/2016, 5:56 PM

## 2016-08-15 NOTE — Progress Notes (Addendum)
Nutrition Follow-up  DOCUMENTATION CODES:   Obesity unspecified  INTERVENTION:  Provide Boost Breeze po BID, each supplement provides 250 kcal and 9 grams of protein.  Encourage adequate PO intake.   NUTRITION DIAGNOSIS:   Inadequate oral intake related to poor appetite as evidenced by per patient/family report; improved  GOAL:   Patient will meet greater than or equal to 90% of their needs; met  MONITOR:   PO intake, Supplement acceptance, Labs, Weight trends, Skin, I & O's  REASON FOR ASSESSMENT:   Malnutrition Screening Tool    ASSESSMENT:   82 y.o. male with history of hypertension, CAD status post CABG, BPH with kidney stones. Presented 07/29/2016 with recurrent pelvic pain 2 days. CT abdomen and pelvis showed moderate right sided mild left-sided hydronephrosis. No distal obstruction seen. Diffuse irregular bladder wall thickening and prominent surrounding soft inflammation compatible with cystitis. Follow-up renal ultrasound again showed mild hydronephrosis.  Pt reports appetite has improved. Meal completion has been 75-100%. Pt currently has Boost Breeze ordered and has been consuming them. RD to continue with current orders.  Plans for discharge tomorrow.   Diet Order:  Diet regular Room service appropriate? Yes; Fluid consistency: Thin  Skin:  Reviewed, no issues  Last BM:  9/5  Height:   Ht Readings from Last 1 Encounters:  07/28/16 6' 3"  (1.905 m)    Weight:   Wt Readings from Last 1 Encounters:  08/15/16 273 lb 11.2 oz (124.1 kg)    Ideal Body Weight:  89.09 kg  BMI:  Body mass index is 34.21 kg/m.  Estimated Nutritional Needs:   Kcal:  1900-2100  Protein:  90-105 grams  Fluid:  1.8 L/day  EDUCATION NEEDS:   Education needs addressed  Corrin Parker, MS, RD, LDN Pager # [phone] After hours/ weekend pager # [phone]

## 2016-08-15 NOTE — Progress Notes (Signed)
[place] PHYSICAL MEDICINE & REHABILITATION     PROGRESS NOTE  Subjective/Complaints:  Pt laying in bed eating breakfast.  He states that his neck does not itch, but he is in constant pain, which has not improved, which is a completely different account than he has been giving.  Spoke with nursing, who also received report that pt was complaining of itching, but no pain overnight.  He states, outside of this, he is doing very well.   ROS:  +Right neck pain. Denies CP, SOB, N/V/D.  Objective: Vital Signs: Blood pressure 116/79, pulse 98, temperature 98.4 F (36.9 C), temperature source Oral, resp. rate 20, weight (!) 144.8 kg (319 lb 3.6 oz), SpO2 100 %. No results found.  Recent Labs  08/13/16 0455  WBC 9.1  HGB 11.0*  HCT 34.8*  PLT 256    Recent Labs  08/13/16 0455  NA 133*  K 4.3  CL 107  GLUCOSE 102*  BUN 30*  CREATININE 1.46*  CALCIUM 9.0   CBG (last 3)  No results for input(s): GLUCAP in the last 72 hours.  Wt Readings from Last 3 Encounters:  08/15/16 (!) 144.8 kg (319 lb 3.6 oz)  08/02/16 129.8 kg (286 lb 3.2 oz)  09/16/15 (!) 140.6 kg (310 lb)    Physical Exam:  BP 116/79 (BP Location: Right Arm)   Pulse 98   Temp 98.4 F (36.9 C) (Oral)   Resp 20   Wt (!) 144.8 kg (319 lb 3.6 oz)   SpO2 100%   BMI 39.90 kg/m  Constitutional: He appears well-developed. Obese. Vital signs reviewed.  HENT: Normocephalicand atraumatic.  Eyes: Conjunctivaeand EOMare normal.  Cardiovascular: Normal rateand regular rhythm.  Respiratory: Effort normaland breath sounds normal. No respiratory distress.  GI: Soft. He exhibits no distension. Musculoskeletal: No tenderness, no edema in extremities. Left knee effusion with tenderness, no erythema Right ankle pain with ROM but no tenderness or erythema No scrotal edema Neurological: He is alert.  Follows simple commands. Motor: B/l UE: 5/5 proximal to distal RLE: Hip flexion 4+/5, knee extension 4+/5, ankle  dorsi/plantar flexion 5/5  LLE: Hip flexion 4+/5, knee extension 4+/5, ankle dorsi/plantar flexion 5/5 Skin: Skin is warmand dry.  Venous stasis changes b/l LE, R>L Rash improving Psychiatric: He has a normal mood and affect. His behavior is normal  Assessment/Plan: 1. Functional deficits secondary to cystitis/hydronephrosis which require 3+ hours per day of interdisciplinary therapy in a comprehensive inpatient rehab setting. Physiatrist is providing close team supervision and 24 hour management of active medical problems listed below. Physiatrist and rehab team continue to assess barriers to discharge/monitor patient progress toward functional and medical goals.  Function:  Bathing Bathing position   Position: Standing at sink  Bathing parts Body parts bathed by patient: Right arm, Left arm, Chest, Abdomen, Front perineal area, Buttocks, Right upper leg, Left upper leg Body parts bathed by helper: Buttocks, Left lower leg, Right lower leg, Back  Bathing assist Assist Level: Supervision or verbal cues      Upper Body Dressing/Undressing Upper body dressing   What is the patient wearing?: Pull over shirt/dress     Pull over shirt/dress - Perfomed by patient: Thread/unthread right sleeve, Thread/unthread left sleeve, Put head through opening, Pull shirt over trunk Pull over shirt/dress - Perfomed by helper: Pull shirt over trunk        Upper body assist Assist Level: More than reasonable time   Set up : To obtain clothing/put away  Lower Body Dressing/Undressing Lower  body dressing   What is the patient wearing?: Underwear, Pants, Non-skid slipper socks Underwear - Performed by patient: Thread/unthread right underwear leg, Thread/unthread left underwear leg, Pull underwear up/down Underwear - Performed by helper: Thread/unthread right underwear leg, Thread/unthread left underwear leg Pants- Performed by patient: Thread/unthread right pants leg, Thread/unthread left pants leg,  Pull pants up/down Pants- Performed by helper: Thread/unthread right pants leg, Thread/unthread left pants leg Non-skid slipper socks- Performed by patient: Don/doff right sock, Don/doff left sock Non-skid slipper socks- Performed by helper: Don/doff right sock, Don/doff left sock               TED Hose - Performed by helper: Don/doff right TED hose, Don/doff left TED hose  Lower body assist Assist for lower body dressing: More than reasonable time, Assistive device Assistive Device Comment: Sock aid and Tour manager activity did not occur: No continent bowel/bladder event Toileting steps completed by patient: Adjust clothing prior to toileting, Performs perineal hygiene, Adjust clothing after toileting Toileting steps completed by helper: Adjust clothing after toileting Toileting Assistive Devices: Grab bar or rail  Toileting assist Assist level: Supervision or verbal cues   Transfers Chair/bed transfer   Chair/bed transfer method: Ambulatory Chair/bed transfer assist level: Supervision or verbal cues Chair/bed transfer assistive device: Armrests, Environmental manager lift: Architectural technologist activity did not occur: Safety/medical concerns   Max distance: >200 ft Assist level: Supervision or Psychologist, clinical activity did not occur:  (New pt, will be evaluated by PT in AM) Type: Manual Max wheelchair distance: 169f  Assist Level: Supervision or verbal cues  Cognition Comprehension Comprehension assist level: Follows complex conversation/direction with extra time/assistive device  Expression Expression assist level: Expresses complex ideas: With extra time/assistive device  Social Interaction Social Interaction assist level: Interacts appropriately with others with medication or extra time (anti-anxiety, antidepressant).  Problem Solving Problem solving assist level: Solves complex problems: With extra time   Memory Memory assist level: Complete Independence: No helper     Medical Problem List and Plan: 1. Debilitationsecondary to cystitis/hydronephrosis  Cont CIR 2. DVT Prophylaxis/Anticoagulation: Subcutaneous heparin. Monitor platelet counts and any signs of bleeding 3. Pain Management: Ultram as needed  Gabapentin increased to 300 TID on 9/6 4. UTI/Aerococcus. Amoxicillin completed 8/30 5. Neuropsych: This patient iscapable of making decisions on hisown behalf. 6. Skin/Wound Care: Routine skin checks 7. Fluids/Electrolytes/Nutrition: Routine I&O's  Cont to encourage fluids, IVF on night of 8/31 again on 9/4  AKI, Cr 1.46 on 9/4, likely due to fluid intake 8.CAD status post CABG. Discuss resumingaspirinwith Dr. HTerrence Dupont9.Acute on chronic anemia.   Hb 11.0 on 9/4  Will cont to monitor 10.Hyperlipidemia. Resume Crestor 11.  Left knee effusion as well as right ankle joint pain: Improving   Uric acid WNL 8/24  ESR elevated 8/24--this may be due to shingles  Xrays reviewed, showing degenerative changes in ankle and effusion in knee.  Medrol dosepack 8/25-8/30 completed 12. HTN  Will monitor with increased activity  Lisinopril 5 started 8/26, increased to 10 on 8/27, d/ced  Will cont to monitor and consider restarting if needed 13. Urinary incontinence  Foley placed by Urology on 8/21, d/ced 8/26 due to pain and swelling  Timed voiding   PVRs unremarkable  UA with bacteria, Ucx NG  Empiric macrobid started on 8/31, d/ced on 9/1  Cont to await Urology input, will contact again today as pt planned on being d/cing tomorrow 14. Bilateral  complex hydroceles  U/S ordered and reviewed  Pain and edema has decreased after d/cing foley   Will need follow up as outpt 15. Leukocytosis: Resolved  Likely steroid induced  Afebrile  Will cont to monitor 16. Right neck pash  continue benadryl  Will order smear  VZV PCR ordered, contact precautions 17. Mild hyponatremia  Na+ 133  on 9/4 (stable)  Cont to monitor    LOS (Days) 13 A FACE TO FACE EVALUATION WAS PERFORMED  Diann Bangerter [name] 08/15/2016 7:52 AM

## 2016-08-16 DIAGNOSIS — R21 Rash and other nonspecific skin eruption: Secondary | ICD-10-CM

## 2016-08-16 MED ORDER — LIDOCAINE 5 % EX PTCH: 1.0000 | MEDICATED_PATCH | CUTANEOUS | 0 refills | Status: DC

## 2016-08-16 MED ORDER — TRAMADOL HCL 50 MG PO TABS: 50.0000 mg | ORAL_TABLET | Freq: Four times a day (QID) | ORAL | 0 refills | Status: DC | PRN

## 2016-08-16 MED ORDER — NITROFURANTOIN MONOHYD MACRO 100 MG PO CAPS
100.0000 mg | ORAL_CAPSULE | Freq: Two times a day (BID) | ORAL | Status: DC
  Administered 2016-08-16: 100 mg via ORAL
  Filled 2016-08-16: qty 1

## 2016-08-16 MED ORDER — DIPHENHYDRAMINE-ZINC ACETATE 2-0.1 % EX CREA: TOPICAL_CREAM | Freq: Three times a day (TID) | CUTANEOUS | 0 refills | Status: DC

## 2016-08-16 MED ORDER — NITROFURANTOIN MONOHYD MACRO 100 MG PO CAPS: 100.0000 mg | ORAL_CAPSULE | Freq: Two times a day (BID) | ORAL | 0 refills | Status: DC

## 2016-08-16 MED ORDER — GABAPENTIN 300 MG PO CAPS: 300.0000 mg | ORAL_CAPSULE | Freq: Three times a day (TID) | ORAL | 0 refills | Status: DC

## 2016-08-16 NOTE — Progress Notes (Signed)
Pt received discharge instructions. Pt discharged to home with wife with all belongings.

## 2016-08-16 NOTE — Progress Notes (Signed)
Social Work  Discharge Note  The overall goal for the admission was met for:   Discharge location: Yes-HOME WITH WIFE WHO CAN PROVIDE 24 HR SUEPRVISION  Length of Stay: Yes-14 DAYS  Discharge activity level: Yes-SUPERVISION LEVEL  Home/community participation: Yes  Services provided included: MD, RD, PT, OT, RN, CM, TR, Pharmacy and SW  Financial Services: Medicare and Private Insurance: [place]  Follow-up services arranged: Home Health: [facility] CARE-PT,OT,RN, DME: [facility] and Patient/Family has no preference for HH/DME agencies  Comments (or additional information):WIFE [place] FEELS COMFORTABLE WITH HIS CARE, SHE WAS ASSISTING PRIOR TO ADMISSION-SHE IS IS A CNA  Patient/Family verbalized understanding of follow-up arrangements: Yes  Individual responsible for coordination of the follow-up plan: SELF & ALICE-WIFE  Confirmed correct DME delivered: [name] 08/16/2016    [name]

## 2016-08-16 NOTE — Progress Notes (Signed)
[facility] PHYSICAL MEDICINE & REHABILITATION     PROGRESS NOTE  Subjective/Complaints:  Pt laying in bed this AM.  He states his neck feels better, but "I still know that it's there". He is looking forward to being discharged today.   ROS:  Denies CP, SOB, N/V/D.  Objective: Vital Signs: Blood pressure 114/70, pulse 85, temperature 98.4 F (36.9 C), temperature source Oral, resp. rate 17, weight 123.8 kg (273 lb), SpO2 96 %. No results found.  Recent Labs  08/15/16 1644  WBC 9.9  HGB 11.3*  HCT 35.5*  PLT 251   No results for input(s): NA, K, CL, GLUCOSE, BUN, CREATININE, CALCIUM in the last 72 hours.  Invalid input(s): CO CBG (last 3)  No results for input(s): GLUCAP in the last 72 hours.  Wt Readings from Last 3 Encounters:  08/16/16 123.8 kg (273 lb)  08/02/16 129.8 kg (286 lb 3.2 oz)  09/16/15 (!) 140.6 kg (310 lb)    Physical Exam:  BP 114/70 (BP Location: Right Arm)   Pulse 85   Temp 98.4 F (36.9 C) (Oral)   Resp 17   Wt 123.8 kg (273 lb)   SpO2 96%   BMI 34.12 kg/m  Constitutional: He appears well-developed. Obese. Vital signs reviewed.  HENT: Normocephalicand atraumatic.  Eyes: Conjunctivaeand EOMare normal.  Cardiovascular: Normal rateand regular rhythm.  Respiratory: Effort normaland breath sounds normal. No respiratory distress.  GI: Soft. He exhibits no distension. Musculoskeletal: No tenderness, no edema in extremities. Left knee effusion with tenderness, no erythema Right ankle pain with ROM but no tenderness or erythema No scrotal edema Neurological: He is alert.  Follows simple commands. Motor: B/l UE: 5/5 proximal to distal RLE: Hip flexion 4+/5, knee extension 4+-5/5, ankle dorsi/plantar flexion 5/5  LLE: Hip flexion 4+/5, knee extension 4+-5/5, ankle dorsi/plantar flexion 5/5 Skin: Skin is warmand dry.  Venous stasis changes b/l LE, R>L Rash continues to improve Psychiatric: He has a normal mood and affect. His behavior is  normal  Assessment/Plan: 1. Functional deficits secondary to cystitis/hydronephrosis which require 3+ hours per day of interdisciplinary therapy in a comprehensive inpatient rehab setting. Physiatrist is providing close team supervision and 24 hour management of active medical problems listed below. Physiatrist and rehab team continue to assess barriers to discharge/monitor patient progress toward functional and medical goals.  Function:  Bathing Bathing position   Position: Standing at sink (Seated in w/c at sink)  Bathing parts Body parts bathed by patient: Right arm, Left arm, Chest, Abdomen, Front perineal area, Buttocks, Right upper leg, Left upper leg, Right lower leg, Left lower leg Body parts bathed by helper: Buttocks, Left lower leg, Right lower leg, Back  Bathing assist Assist Level: Supervision or verbal cues      Upper Body Dressing/Undressing Upper body dressing   What is the patient wearing?: Pull over shirt/dress     Pull over shirt/dress - Perfomed by patient: Thread/unthread right sleeve, Thread/unthread left sleeve, Put head through opening, Pull shirt over trunk Pull over shirt/dress - Perfomed by helper: Pull shirt over trunk        Upper body assist Assist Level: No help, No cues   Set up : To obtain clothing/put away  Lower Body Dressing/Undressing Lower body dressing   What is the patient wearing?: Pants, Non-skid slipper socks, Underwear Underwear - Performed by patient: Thread/unthread right underwear leg, Thread/unthread left underwear leg, Pull underwear up/down Underwear - Performed by helper: Thread/unthread right underwear leg, Thread/unthread left underwear leg Pants- Performed  by patient: Thread/unthread right pants leg, Thread/unthread left pants leg, Pull pants up/down Pants- Performed by helper: Thread/unthread right pants leg, Thread/unthread left pants leg Non-skid slipper socks- Performed by patient: Don/doff right sock, Don/doff left  sock Non-skid slipper socks- Performed by helper: Don/doff right sock, Don/doff left sock               TED Hose - Performed by helper: Don/doff right TED hose, Don/doff left TED hose  Lower body assist Assist for lower body dressing: More than reasonable time, Assistive device Assistive Device Comment: Sock aid and Tour manager activity did not occur: No continent bowel/bladder event Toileting steps completed by patient: Adjust clothing prior to toileting, Performs perineal hygiene, Adjust clothing after toileting Toileting steps completed by helper: Adjust clothing after toileting Toileting Assistive Devices: Grab bar or rail  Toileting assist Assist level: More than reasonable time   Transfers Chair/bed transfer   Chair/bed transfer method: Stand pivot Chair/bed transfer assist level: Supervision or verbal cues Chair/bed transfer assistive device: Armrests, Environmental manager lift: Architectural technologist activity did not occur: Safety/medical concerns   Max distance: 256f Assist level: Supervision or verbal cues   Wheelchair Wheelchair activity did not occur: N/A Type: Manual Max wheelchair distance: 18101f Assist Level: Supervision or verbal cues  Cognition Comprehension Comprehension assist level: Follows complex conversation/direction with extra time/assistive device  Expression Expression assist level: Expresses complex ideas: With extra time/assistive device  Social Interaction Social Interaction assist level: Interacts appropriately with others with medication or extra time (anti-anxiety, antidepressant).  Problem Solving Problem solving assist level: Solves complex problems: With extra time  Memory Memory assist level: Complete Independence: No helper     Medical Problem List and Plan: 1. Debilitationsecondary to cystitis/hydronephrosis  D/c today  Will see pt in 1-2 weeks for transitional care management 2.  DVT Prophylaxis/Anticoagulation: Subcutaneous heparin. Monitor platelet counts and any signs of bleeding 3. Pain Management: Ultram as needed  Gabapentin increased to 300 TID on 9/6 with good improvement 4. UTI/Aerococcus. Amoxicillin completed 8/30 5. Neuropsych: This patient iscapable of making decisions on hisown behalf. 6. Skin/Wound Care: Routine skin checks 7. Fluids/Electrolytes/Nutrition: Routine I&O's  Cont to encourage fluids, IVF on night of 8/31 again on 9/4  AKI, Cr 1.46 on 9/4, likely due to fluid intake 8.CAD status post CABG. Discuss resumingaspirinwith Dr. HaTerrence Dupont.Acute on chronic anemia.   Hb 11.3 on 9/6  Will cont to monitor 10.Hyperlipidemia. Resume Crestor 11.  Left knee effusion as well as right ankle joint pain: Improving   Uric acid WNL 8/24  ESR elevated 8/24--this may be due to shingles  Xrays reviewed, showing degenerative changes in ankle and effusion in knee.  Medrol dosepack 8/25-8/30 completed 12. HTN  Will monitor with increased activity  Lisinopril 5 started 8/26, increased to 10 on 8/27, d/ced  Controlled at present 13. Urinary incontinence  Foley placed by Urology on 8/21, d/ced 8/26 due to pain and swelling  Timed voiding   PVRs unremarkable  UA with bacteria, Ucx NG  Empiric macrobid started on 8/31, d/ced on 9/1  Per Urology cont condom cath at d/c, follow as outpt  UA with bacteria on 9/6, Ucx pending - Empiric macrobid 14. Bilateral complex hydroceles  U/S ordered and reviewed  Pain and edema has decreased after d/cing foley   Follow up as outpt with Urology 15. Leukocytosis: Resolved  Likely steroid induced  Afebrile  Will cont to monitor 16. Right neck  pash  continue benadryl  Will order smear  VZV PCR ordered, contact precautions  Called [facility], results to be obtained tomorrow.  Will call in prescription if positive 17. Mild hyponatremia  Na+ 133 on 9/4 (stable)  Cont to monitor    LOS (Days) 14 A FACE TO FACE  EVALUATION WAS PERFORMED  [name] 08/16/2016 8:05 AM

## 2016-08-16 NOTE — Discharge Summary (Signed)
NAMEHARMAN, LANGHANS NO.:  [id number]  MEDICAL RECORD NO.:  [id number]  LOCATION:  4M03C                        FACILITY:  [facility]  PHYSICIAN:  Delice Lesch, MD        DATE OF BIRTH:  November 16, 1931  DATE OF ADMISSION:  08/02/2016 DATE OF DISCHARGE:  08/16/2016                              DISCHARGE SUMMARY   DISCHARGE DIAGNOSES: 1. Debilitation secondary to cystitis and hydronephrosis. 2. Subcutaneous heparin for DVT prophylaxis.  Pain management. 3. Urinary tract infection. 4. Coronary artery disease status post CABG. 5. Acute on chronic anemia. 6. Hyperlipidemia. 7. Left knee effusion as well as right ankle joint pain. 8. Hypertension. 9. Bilateral complex hydroceles. 10.Right neck rash.  HISTORY OF PRESENT ILLNESS:  This is an 82 year old right-handed male with history of hypertension, CAD with CABG.  Lives with spouse, independent with a cane and walker prior to admission.  Presented on July 29, 2016, with recurrent pelvic pain x2 days.  CT abdomen and pelvis showed moderate right-sided and mild left-sided hydronephrosis, no distal obstruction.  Diffuse irregular bladder wall thickening and prominent surrounding soft inflammation compatible with cystitis. Followup renal ultrasound showed mild hydronephrosis.  Urine culture; greater than 100,000 Aerococcus and placed on amoxicillin.  Subcutaneous heparin for DVT prophylaxis.  Physical and occupational therapy ongoing. The patient was admitted for comprehensive rehab program.  PAST MEDICAL HISTORY:  See discharge diagnoses.  SOCIAL HISTORY:  Lives with spouse.  Functional history upon admission to rehab services:  +2 physical assist for lateral scoot transfers; +2 physical assist, sit to side lying; mod to max assist for activities of daily living.  PHYSICAL EXAMINATION:  VITAL SIGNS:  Blood pressure 114/69, pulse 87, temperature 99, respirations 19. GENERAL:  This was an alert male, well-developed.   Oriented to person, place, date of birth.  Followed commands. LUNGS:  Clear to auscultation without wheeze. CARDIAC:  Regular rate and rhythm.  No murmur. ABDOMEN:  Soft, nontender.  Good bowel sounds. EXTREMITIES:  He had some venous stasis changes in lower extremities.  REHABILITATION/HOSPITAL COURSE:  The patient was admitted to inpatient rehab services with therapies initiated on a 3-hour daily basis, consisting of physical therapy, occupational therapy, and rehabilitation nursing.  The following issues were addressed during the patient's rehabilitation stay.  Pertaining to Mr. Routzahn's cystitis and hydronephrosis, he continued to improve nicely, still had some urinary incontinence, recent Foley catheter tube had been removed, most recent urine study 08/15/2016 positive nitrite with many bacteria placed on Macrobid 7 days, remaining afebrile, he would follow up with Dr. Trixie Rude for his urology needs at [facility].  Subcutaneous heparin for DVT prophylaxis. No bleeding episodes.  Blood pressures controlled with lisinopril, recently discontinued due to some orthostasis.  He had some left knee effusion, ESR was mildly elevated.  X-ray showed some degenerative changes in the ankle as well as in the knee.  He did complete a Medrol Dosepak.  Followed by [name], with noted history of CAD and CABG. The patient had been on aspirin in the past.  This was recently held due to urology issues.  Would follow back up with [name] in regard to resuming low-dose aspirin therapy.  Pain  management, was using Neurontin 100 mg 3 times daily as well as a Lidoderm patch.  Ultram as needed for breakthrough pain.  Noted rash on the side of his neck, improved with Benadryl, there were some initial concerns of possible shingles, but did improve with the use of Benadryl and monitored.VZV/PCR pending and remained on contact precautions.  The patient received weekly collaborative  interdisciplinary team conferences to discuss estimated length of stay, family teaching, any barriers to discharge. The patient ambulating 300 feet throughout the unit on even surfaces, low carpet, using rolling walker with supervision.  Stair negotiation with supervision.  Educated patient on negotiating obstacles with his rolling walker.  Could gather belongings for activities of daily living and homemaking, for dressing, grooming, and homemaking.  Full family teaching was completed.  It was discussed no driving and the patient discharged to home.  DISCHARGE MEDICATIONS: 1. Benadryl 12.5 three times daily as needed. 2. Neurontin 100 mg p.o. b.i.d. 3. Lidoderm patch, change every 12 hours. 4. Oxycodone 5 mg every 4 hours as needed for moderate pain, dispense     of 10 tablets. 5. Macrobid 100 mg every 12 hours 7 days    DIET:  Regular.  FOLLOWUP:  The patient would follow up with Dr. Posey Pronto at the outpatient rehab service office as needed; [name] urology services [facility] call for appointment 1 week; [name] on August 21, 2016.  SPECIAL INSTRUCTIONS:  Discussed with [name] on plan to resume aspirin therapy.     Lauraine Rinne, P.A.   ______________________________ Delice Lesch, MD    DA/MEDQ  D:  08/15/2016  T:  08/16/2016  Job:  [id number]  cc:   Allegra Lai. Terrence Dupont, [name] Bernestine Amass, MD

## 2016-08-18 LAB — URINE CULTURE: Culture: >=100000 — AB

## 2016-08-20 LAB — VARICELLA-ZOSTER BY PCR: VARICELLA-ZOSTER, PCR: POSITIVE — AB

## 2016-09-01 ENCOUNTER — Encounter (HOSPITAL_COMMUNITY): Payer: Self-pay

## 2016-09-01 ENCOUNTER — Emergency Department (HOSPITAL_COMMUNITY): Payer: Medicare Other

## 2016-09-01 ENCOUNTER — Inpatient Hospital Stay (HOSPITAL_COMMUNITY)
Admission: EM | Admit: 2016-09-01 | Discharge: 2016-09-04 | DRG: 690 | Disposition: A | Discharge status: Home / Self Care | Payer: Medicare Other | Attending: Internal Medicine | Admitting: Internal Medicine

## 2016-09-01 DIAGNOSIS — N179 Acute kidney failure, unspecified: Secondary | ICD-10-CM

## 2016-09-01 DIAGNOSIS — N182 Chronic kidney disease, stage 2 (mild): Secondary | ICD-10-CM | POA: Diagnosis present

## 2016-09-01 DIAGNOSIS — Z87442 Personal history of urinary calculi: Secondary | ICD-10-CM | POA: Diagnosis not present

## 2016-09-01 DIAGNOSIS — N400 Enlarged prostate without lower urinary tract symptoms: Secondary | ICD-10-CM | POA: Diagnosis present

## 2016-09-01 DIAGNOSIS — K5900 Constipation, unspecified: Secondary | ICD-10-CM | POA: Diagnosis present

## 2016-09-01 DIAGNOSIS — Z8249 Family history of ischemic heart disease and other diseases of the circulatory system: Secondary | ICD-10-CM

## 2016-09-01 DIAGNOSIS — R531 Weakness: Secondary | ICD-10-CM

## 2016-09-01 DIAGNOSIS — Z951 Presence of aortocoronary bypass graft: Secondary | ICD-10-CM

## 2016-09-01 DIAGNOSIS — Z833 Family history of diabetes mellitus: Secondary | ICD-10-CM | POA: Diagnosis not present

## 2016-09-01 DIAGNOSIS — N1330 Unspecified hydronephrosis: Secondary | ICD-10-CM

## 2016-09-01 DIAGNOSIS — C679 Malignant neoplasm of bladder, unspecified: Secondary | ICD-10-CM | POA: Diagnosis present

## 2016-09-01 DIAGNOSIS — N10 Acute tubulo-interstitial nephritis: Secondary | ICD-10-CM | POA: Diagnosis present

## 2016-09-01 DIAGNOSIS — N189 Chronic kidney disease, unspecified: Secondary | ICD-10-CM | POA: Diagnosis not present

## 2016-09-01 DIAGNOSIS — I2510 Atherosclerotic heart disease of native coronary artery without angina pectoris: Secondary | ICD-10-CM | POA: Diagnosis present

## 2016-09-01 DIAGNOSIS — Z8551 Personal history of malignant neoplasm of bladder: Secondary | ICD-10-CM | POA: Diagnosis not present

## 2016-09-01 DIAGNOSIS — E860 Dehydration: Secondary | ICD-10-CM | POA: Diagnosis present

## 2016-09-01 DIAGNOSIS — Z841 Family history of disorders of kidney and ureter: Secondary | ICD-10-CM | POA: Diagnosis not present

## 2016-09-01 DIAGNOSIS — I129 Hypertensive chronic kidney disease with stage 1 through stage 4 chronic kidney disease, or unspecified chronic kidney disease: Secondary | ICD-10-CM | POA: Diagnosis present

## 2016-09-01 DIAGNOSIS — Z888 Allergy status to other drugs, medicaments and biological substances status: Secondary | ICD-10-CM | POA: Diagnosis not present

## 2016-09-01 DIAGNOSIS — Z9689 Presence of other specified functional implants: Secondary | ICD-10-CM | POA: Diagnosis present

## 2016-09-01 DIAGNOSIS — K5909 Other constipation: Secondary | ICD-10-CM | POA: Diagnosis not present

## 2016-09-01 DIAGNOSIS — Z79899 Other long term (current) drug therapy: Secondary | ICD-10-CM | POA: Diagnosis not present

## 2016-09-01 DIAGNOSIS — Z8546 Personal history of malignant neoplasm of prostate: Secondary | ICD-10-CM

## 2016-09-01 DIAGNOSIS — Z9889 Other specified postprocedural states: Secondary | ICD-10-CM

## 2016-09-01 DIAGNOSIS — I10 Essential (primary) hypertension: Secondary | ICD-10-CM | POA: Diagnosis present

## 2016-09-01 DIAGNOSIS — N390 Urinary tract infection, site not specified: Principal | ICD-10-CM | POA: Diagnosis present

## 2016-09-01 DIAGNOSIS — I25810 Atherosclerosis of coronary artery bypass graft(s) without angina pectoris: Secondary | ICD-10-CM | POA: Diagnosis present

## 2016-09-01 HISTORY — DX: Hyperlipidemia, unspecified: E78.5

## 2016-09-01 HISTORY — DX: Malignant neoplasm of bladder, unspecified: C67.9

## 2016-09-01 LAB — I-STAT CG4 LACTIC ACID, ED
LACTIC ACID, VENOUS: 1.98 mmol/L — AB (ref 0.5–1.9)
Lactic Acid, Venous: 2.54 mmol/L (ref 0.5–1.9)
Lactic Acid, Venous: 2.13 mmol/L (ref 0.5–1.9)

## 2016-09-01 LAB — CBC WITH DIFFERENTIAL/PLATELET
BASOS ABS: 0 10*3/uL (ref 0.0–0.1)
Basophils Relative: 0 %
EOS PCT: 2 %
Eosinophils Absolute: 0.3 10*3/uL (ref 0.0–0.7)
HCT: 37 % — ABNORMAL LOW (ref 39.0–52.0)
HEMOGLOBIN: 12 g/dL — AB (ref 13.0–17.0)
LYMPHS ABS: 2.9 10*3/uL (ref 0.7–4.0)
LYMPHS PCT: 24 %
MCH: 28.2 pg (ref 26.0–34.0)
MCHC: 32.4 g/dL (ref 30.0–36.0)
MCV: 86.9 fL (ref 78.0–100.0)
Monocytes Absolute: 1.1 10*3/uL — ABNORMAL HIGH (ref 0.1–1.0)
Monocytes Relative: 9 %
NEUTROS ABS: 7.8 10*3/uL — AB (ref 1.7–7.7)
NEUTROS PCT: 65 %
PLATELETS: 354 10*3/uL (ref 150–400)
RBC: 4.26 MIL/uL (ref 4.22–5.81)
RDW: 15.2 % (ref 11.5–15.5)
WBC: 12 10*3/uL — AB (ref 4.0–10.5)

## 2016-09-01 LAB — COMPREHENSIVE METABOLIC PANEL
ALBUMIN: 3.1 g/dL — AB (ref 3.5–5.0)
ALK PHOS: 53 U/L (ref 38–126)
ALT: 18 U/L (ref 17–63)
ANION GAP: 10 (ref 5–15)
AST: 23 U/L (ref 15–41)
BUN: 16 mg/dL (ref 6–20)
CHLORIDE: 107 mmol/L (ref 101–111)
CO2: 21 mmol/L — AB (ref 22–32)
Calcium: 9.7 mg/dL (ref 8.9–10.3)
Creatinine, Ser: 1.25 mg/dL — ABNORMAL HIGH (ref 0.61–1.24)
GFR calc non Af Amer: 51 mL/min — ABNORMAL LOW (ref >60)
GFR, EST AFRICAN AMERICAN: 60 mL/min — AB (ref >60)
GLUCOSE: 103 mg/dL — AB (ref 65–99)
POTASSIUM: 4.7 mmol/L (ref 3.5–5.1)
SODIUM: 138 mmol/L (ref 135–145)
Total Bilirubin: 0.6 mg/dL (ref 0.3–1.2)
Total Protein: 7.9 g/dL (ref 6.5–8.1)

## 2016-09-01 LAB — URINALYSIS, ROUTINE W REFLEX MICROSCOPIC
Bilirubin Urine: NEGATIVE
GLUCOSE, UA: NEGATIVE mg/dL
KETONES UR: 15 mg/dL — AB
Nitrite: POSITIVE — AB
Specific Gravity, Urine: 1.018 (ref 1.005–1.030)
pH: 7.5 (ref 5.0–8.0)

## 2016-09-01 LAB — URINE MICROSCOPIC-ADD ON

## 2016-09-01 MED ORDER — ONDANSETRON HCL 4 MG/2ML IJ SOLN
4.0000 mg | Freq: Four times a day (QID) | INTRAMUSCULAR | Status: DC | PRN
  Administered 2016-09-01: 4 mg via INTRAVENOUS
  Filled 2016-09-01: qty 2

## 2016-09-01 MED ORDER — ADULT MULTIVITAMIN W/MINERALS CH
1.0000 | ORAL_TABLET | Freq: Every day | ORAL | Status: DC
  Administered 2016-09-02 – 2016-09-04 (×3): 1 via ORAL
  Filled 2016-09-01 (×3): qty 1

## 2016-09-01 MED ORDER — ACETAMINOPHEN 500 MG PO TABS: 500.0000 mg | ORAL_TABLET | Freq: Four times a day (QID) | ORAL | Status: DC | PRN

## 2016-09-01 MED ORDER — KETOROLAC TROMETHAMINE 15 MG/ML IJ SOLN
15.0000 mg | Freq: Once | INTRAMUSCULAR | Status: AC
  Administered 2016-09-01: 15 mg via INTRAVENOUS
  Filled 2016-09-01: qty 1

## 2016-09-01 MED ORDER — DEXTROSE 5 % IV SOLN
2.0000 g | INTRAVENOUS | Status: DC
  Administered 2016-09-02 – 2016-09-03 (×2): 2 g via INTRAVENOUS
  Filled 2016-09-01 (×3): qty 2

## 2016-09-01 MED ORDER — ENOXAPARIN SODIUM 40 MG/0.4ML ~~LOC~~ SOLN
40.0000 mg | Freq: Every day | SUBCUTANEOUS | Status: DC
  Administered 2016-09-01 – 2016-09-02 (×2): 40 mg via SUBCUTANEOUS
  Filled 2016-09-01 (×2): qty 0.4

## 2016-09-01 MED ORDER — SODIUM CHLORIDE 0.9 % IV BOLUS (SEPSIS)
1000.0000 mL | Freq: Once | INTRAVENOUS | Status: AC
  Administered 2016-09-01: 1000 mL via INTRAVENOUS

## 2016-09-01 MED ORDER — SODIUM CHLORIDE 0.9 % IV SOLN
INTRAVENOUS | Status: DC
  Administered 2016-09-01 – 2016-09-03 (×4): via INTRAVENOUS

## 2016-09-01 MED ORDER — ONDANSETRON HCL 4 MG PO TABS: 4.0000 mg | ORAL_TABLET | Freq: Four times a day (QID) | ORAL | Status: DC | PRN

## 2016-09-01 MED ORDER — DEXTROSE 5 % IV SOLN
2.0000 g | Freq: Once | INTRAVENOUS | Status: AC
  Administered 2016-09-01: 2 g via INTRAVENOUS
  Filled 2016-09-01: qty 2

## 2016-09-01 MED ORDER — TRAMADOL HCL 50 MG PO TABS
50.0000 mg | ORAL_TABLET | Freq: Four times a day (QID) | ORAL | Status: DC | PRN
  Administered 2016-09-02 – 2016-09-03 (×4): 50 mg via ORAL
  Filled 2016-09-01 (×5): qty 1

## 2016-09-01 MED ORDER — MULTIVITAMINS PO CAPS: 1.0000 | ORAL_CAPSULE | Freq: Every day | ORAL | Status: DC

## 2016-09-01 MED ORDER — MORPHINE SULFATE (PF) 2 MG/ML IV SOLN
2.0000 mg | INTRAVENOUS | Status: DC | PRN
  Administered 2016-09-01: 2 mg via INTRAVENOUS
  Filled 2016-09-01: qty 1

## 2016-09-01 MED ORDER — ONDANSETRON HCL 4 MG/2ML IJ SOLN: 4.0000 mg | Freq: Four times a day (QID) | INTRAMUSCULAR | Status: DC | PRN

## 2016-09-01 MED ORDER — ROSUVASTATIN CALCIUM 10 MG PO TABS
10.0000 mg | ORAL_TABLET | Freq: Every evening | ORAL | Status: DC
  Administered 2016-09-02 – 2016-09-03 (×2): 10 mg via ORAL
  Filled 2016-09-01 (×2): qty 1

## 2016-09-01 MED ORDER — NITROGLYCERIN 0.4 MG/HR TD PT24
0.4000 mg | MEDICATED_PATCH | Freq: Every day | TRANSDERMAL | Status: DC
  Administered 2016-09-02 – 2016-09-04 (×3): 0.4 mg via TRANSDERMAL
  Filled 2016-09-01 (×3): qty 1

## 2016-09-01 MED ORDER — FAMOTIDINE 20 MG PO TABS
20.0000 mg | ORAL_TABLET | Freq: Two times a day (BID) | ORAL | Status: DC
  Administered 2016-09-01 – 2016-09-04 (×6): 20 mg via ORAL
  Filled 2016-09-01 (×6): qty 1

## 2016-09-01 NOTE — ED Notes (Signed)
Patient transported to X-ray 

## 2016-09-01 NOTE — ED Triage Notes (Signed)
Increased shortness of breath the past 3-5 days. Cough described as non-productive. Cough worse at night with chills and diaphoresis, complains of chest soreness with same. Alert and oriented. Skin arm and dry.

## 2016-09-01 NOTE — Progress Notes (Signed)
Pharmacy Antibiotic Note  [name] is a 82 y.o. male admitted on 09/01/2016 with UTI.  Pharmacy has been consulted for cefepime dosing. Pt is afebrile but WBC is elevated. Scr slightly elevated at 1.25. Lactic acid is <2.   Plan: - Cefepime 2gm IV Q24H - F/u renal fxn, C&S, clinical status      Temp (24hrs), Avg:98.3 F (36.8 C), Min:98.3 F (36.8 C), Max:98.3 F (36.8 C)   Recent Labs Lab 09/01/16 1716 09/01/16 1724 09/01/16 1735  WBC  --  12.0*  --   CREATININE  --  1.25*  --   LATICACIDVEN 2.54*  --  1.98*    CrCl cannot be calculated (Unknown ideal weight.).    Allergies  Allergen Reactions  . Other Other (See Comments)    Antihistamines-unknown reaction    Antimicrobials this admission: Cefepime 9/23>>  Dose adjustments this admission: N/A  Microbiology results: Pending  Thank you for allowing pharmacy to be a part of this patient's care.  Cassanda Walmer, Rande Lawman 09/01/2016 6:50 PM

## 2016-09-01 NOTE — ED Provider Notes (Signed)
[facility] DEPT Provider Note   CSN: [id number] Arrival date & time: 09/01/16  1650     History   Chief Complaint Chief Complaint  Patient presents with  . Cough  . Weakness  . Chills    HPI [name] is a 82 y.o. male.  Patient presents with general weakness, fever, chills and cough for the past 2 days. Patient had recent admission to the hospital for urine infection. Patient had recent cystoscopy performed by [facility] urologist for bladder cancer and infection. Patient has had urine output however has general weakness. Recently completed Cipro antibiotics. Symptoms constant      Past Medical History:  Diagnosis Date  . Bladder cancer ([place]) 05/2015  . CAD (coronary artery disease)   . Hyperlipidemia   . Hypertension   . Kidney stones   . Prostate CA [facility])     Patient Active Problem List   Diagnosis Date Noted  . UTI (lower urinary tract infection) 09/01/2016  . Neuropathic pain   . Contact dermatitis   . AKI (acute kidney injury) ([place])   . Recurrent UTI   . Leukocytosis   . Absence of bladder continence   . Pain   . Hydrocele in adult   . Acute cystitis with hematuria   . Acute blood loss anemia   . Anemia of chronic disease   . Other secondary hypertension   . Acute idiopathic gout of left knee   . Urinary retention   . Debilitated 08/02/2016  . Bilateral hydronephrosis   . Normochromic normocytic anemia   . E-coli UTI   . Benign essential HTN   . Coronary artery disease involving coronary bypass graft of native heart without angina pectoris   . BPH (benign prostatic hyperplasia)   . Kidney stones   . CKD (chronic kidney disease)   . Morbid obesity due to excess calories ([place])   . Acute renal failure (ARF) ([place]) 07/29/2016    Past Surgical History:  Procedure Laterality Date  . CARDIAC SURGERY    . CORONARY ARTERY BYPASS GRAFT    . PENILE PROSTHESIS IMPLANT    . PROSTATE SURGERY         Home Medications    Prior to Admission  medications   Medication Sig Start Date End Date Taking? Authorizing Provider  acetaminophen (TYLENOL) 500 MG tablet Take 500-1,000 mg by mouth every 6 (six) hours as needed for mild pain.    Yes Historical Provider, MD  Multiple Vitamin (MULTIVITAMIN) capsule Take 1 capsule by mouth daily.    Yes Historical Provider, MD  nitroGLYCERIN (NITRODUR - DOSED IN MG/24 HR) 0.4 mg/hr patch Place 0.4 mg onto the skin every morning.  08/08/15  Yes Historical Provider, MD  rosuvastatin (CRESTOR) 10 MG tablet Take 10 mg by mouth every evening.  11/10/15  Yes Historical Provider, MD  traMADol (ULTRAM) 50 MG tablet Take 1 tablet (50 mg total) by mouth every 6 (six) hours as needed for moderate pain. 08/16/16  Yes Daniel J Angiulli, PA-C  vitamin C (ASCORBIC ACID) 500 MG tablet Take 500 mg by mouth.    Yes Historical Provider, MD  diphenhydrAMINE-zinc acetate (BENADRYL) cream Apply topically 4 (four) times daily -  with meals and at bedtime. Applied to affected areas Patient not taking: Reported on 09/01/2016 08/16/16   Lavon Paganini Angiulli, PA-C  gabapentin (NEURONTIN) 300 MG capsule Take 1 capsule (300 mg total) by mouth 3 (three) times daily. Patient not taking: Reported on 09/01/2016 08/16/16   Lavon Paganini  Angiulli, PA-C  lidocaine (LIDODERM) 5 % Place 1 patch onto the skin daily. Remove & Discard patch within 12 hours or as directed by MD Patient not taking: Reported on 09/01/2016 08/16/16   Lavon Paganini Angiulli, PA-C    Family History Family History  Problem Relation Age of Onset  . Congestive Heart Failure Mother   . Kidney failure Sister   . Hypertension Sister   . Diabetes Mellitus II Brother   . CAD Brother     Social History Social History  Substance Use Topics  . Smoking status: Never Smoker  . Smokeless tobacco: Never Used  . Alcohol use No     Allergies   Gabapentin and Other   Review of Systems Review of Systems  Constitutional: Positive for appetite change, chills, fatigue and fever.  HENT:  Negative for congestion.   Eyes: Negative for visual disturbance.  Respiratory: Positive for cough. Negative for shortness of breath.   Cardiovascular: Negative for chest pain.  Gastrointestinal: Positive for nausea. Negative for vomiting.  Genitourinary: Positive for flank pain. Negative for dysuria.  Musculoskeletal: Negative for back pain, neck pain and neck stiffness.  Skin: Negative for rash.  Neurological: Negative for light-headedness and headaches.     Physical Exam Updated Vital Signs BP 136/90   Pulse 78   Temp 98.3 F (36.8 C) (Oral)   Resp 20   Ht 6\' 3"  (1.905 m)   Wt 275 lb (124.7 kg)   SpO2 97%   BMI 34.37 kg/m   Physical Exam  Constitutional: He is oriented to person, place, and time. He appears well-developed.  HENT:  Head: Normocephalic and atraumatic.  Dry mucous membranes  Eyes: Right eye exhibits no discharge. Left eye exhibits no discharge.  Neck: Normal range of motion. Neck supple. No tracheal deviation present.  Cardiovascular: Normal rate and regular rhythm.   Pulmonary/Chest: Effort normal and breath sounds normal.  Abdominal: Soft. He exhibits no distension. There is tenderness ( mild suprapubic). There is no guarding.  Musculoskeletal: He exhibits edema (bilateral lower extremity).  Neurological: He is alert and oriented to person, place, and time.  General fatigue  Skin: Skin is warm. No rash noted.  Psychiatric: He has a normal mood and affect.  Nursing note and vitals reviewed.    ED Treatments / Results  Labs (all labs ordered are listed, but only abnormal results are displayed) Labs Reviewed  COMPREHENSIVE METABOLIC PANEL - Abnormal; Notable for the following:       Result Value   CO2 21 (*)    Glucose, Bld 103 (*)    Creatinine, Ser 1.25 (*)    Albumin 3.1 (*)    GFR calc non Af Amer 51 (*)    GFR calc Af Amer 60 (*)    All other components within normal limits  CBC WITH DIFFERENTIAL/PLATELET - Abnormal; Notable for the  following:    WBC 12.0 (*)    Hemoglobin 12.0 (*)    HCT 37.0 (*)    Neutro Abs 7.8 (*)    Monocytes Absolute 1.1 (*)    All other components within normal limits  URINALYSIS, ROUTINE W REFLEX MICROSCOPIC (NOT AT [facility]) - Abnormal; Notable for the following:    Color, Urine AMBER (*)    APPearance TURBID (*)    Hgb urine dipstick LARGE (*)    Ketones, ur 15 (*)    Protein, ur >300 (*)    Nitrite POSITIVE (*)    Leukocytes, UA LARGE (*)  All other components within normal limits  URINE MICROSCOPIC-ADD ON - Abnormal; Notable for the following:    Squamous Epithelial / LPF 0-5 (*)    Bacteria, UA MANY (*)    All other components within normal limits  I-STAT CG4 LACTIC ACID, ED - Abnormal; Notable for the following:    Lactic Acid, Venous 2.54 (*)    All other components within normal limits  I-STAT CG4 LACTIC ACID, ED - Abnormal; Notable for the following:    Lactic Acid, Venous 1.98 (*)    All other components within normal limits  I-STAT CG4 LACTIC ACID, ED - Abnormal; Notable for the following:    Lactic Acid, Venous 2.13 (*)    All other components within normal limits  URINE CULTURE  CULTURE, BLOOD (ROUTINE X 2)  CULTURE, BLOOD (ROUTINE X 2)    EKG  EKG Interpretation  Date/Time:  Saturday September 01 2016 17:00:41 EDT Ventricular Rate:  90 PR Interval:  160 QRS Duration: 124 QT Interval:  398 QTC Calculation: 486 R Axis:   0 Text Interpretation:  Normal sinus rhythm Right bundle branch block Abnormal ECG similar previous Confirmed by Latayvia Mandujano MD, Zyriah Mask [phone]) on 09/01/2016 6:27:16 PM       Radiology Dg Chest 2 View  Result Date: 09/01/2016 CLINICAL DATA:  Shortness of breath EXAM: CHEST  2 VIEW COMPARISON:  04/19/2015 chest radiograph. FINDINGS: Sternotomy wires appear aligned and intact. CABG clips overlie the mediastinum. Stable cardiomediastinal silhouette with normal heart size. No pneumothorax. Small left pleural effusion. No right pleural effusion. No  overt pulmonary edema. Mild bibasilar curvilinear opacities. IMPRESSION: 1. No overt pulmonary edema. 2. Small left pleural effusion. 3. Mild curvilinear bibasilar opacities, favor scarring or atelectasis. Electronically Signed   By: Ilona Sorrel [name]   On: 09/01/2016 19:33    Procedures Procedures (including critical care time)  Medications Ordered in ED Medications  ceFEPIme (MAXIPIME) 2 g in dextrose 5 % 50 mL IVPB (not administered)  sodium chloride 0.9 % bolus 1,000 mL (0 mLs Intravenous Stopped 09/01/16 2052)  ceFEPIme (MAXIPIME) 2 g in dextrose 5 % 50 mL IVPB (2 g Intravenous New Bag/Given 09/01/16 2004)     Initial Impression / Assessment and Plan / ED Course  I have reviewed the triage vital signs and the nursing notes.  Pertinent labs & imaging results that were available during my care of the patient were reviewed by me and considered in my medical decision making (see chart for details).  Clinical Course   Patient presents with general weakness, fever and chills. Concern for urine infection versus less likely pneumonia. Urinalysis consistent with infection. Reviewed recent culture results and antibiotics IV ordered. IV fluids ordered. Discussed with admission team  The patients results and plan were reviewed and discussed.   Any x-rays performed were independently reviewed by myself.   Differential diagnosis were considered with the presenting HPI.  Medications  ceFEPIme (MAXIPIME) 2 g in dextrose 5 % 50 mL IVPB (not administered)  sodium chloride 0.9 % bolus 1,000 mL (0 mLs Intravenous Stopped 09/01/16 2052)  ceFEPIme (MAXIPIME) 2 g in dextrose 5 % 50 mL IVPB (2 g Intravenous New Bag/Given 09/01/16 2004)    Vitals:   09/01/16 1835 09/01/16 1857 09/01/16 1900 09/01/16 2030  BP: 126/65  120/85 136/90  Pulse: 84  79 78  Resp: 18  16 20   Temp:      TempSrc:      SpO2: 100%  98% 97%  Weight:  275 lb (  124.7 kg)    Height:  6\' 3"  (1.905 m)      Final diagnoses:    Pyelonephritis, acute  Generalized weakness    Admission/ observation were discussed with the admitting physician, patient and/or family and they are comfortable with the plan.    Final Clinical Impressions(s) / ED Diagnoses   Final diagnoses:  Pyelonephritis, acute  Generalized weakness    New Prescriptions New Prescriptions   No medications on file     Elnora Morrison, MD 09/01/16 2118

## 2016-09-01 NOTE — H&P (Signed)
History and Physical    ELGIN AGUAYO [id number] DOB: 23-Feb-1931 DOA: 09/01/2016  PCP: [name], MD   Patient coming from: Home.  Chief Complaint: SOB and urinary burning.  HPI: [name] is a 82 y.o. male with medical history significant of Bladder CA, CAD, hyperlipidemia, hypertension, hyperlipidemia, prostate CA who comes to the ED due to several days of dyspnea and dysuria.  Per patient, he has been having nonproductive cough, associated with increased dyspnea, no fever, but positive dry cough, chills, fatigue and night sweats. He complains of worsening suprapubic tenderness and dysuria for several days. He states that his appetite and sleep are decreased. He was taking ciprofloxacin orally, but ran out.  ED Course: The patient received cefepime and normal saline 1 L bolus reporting some relief. Workup shows lactic acid level of 2.5 for mmol/L, creatinine was 1.25 mg/dL, WBC vital 12,000 and a urine analysis showing significant bacteriuria and pyuria.  Review of Systems: As per HPI otherwise 10 point review of systems negative.    Past Medical History:  Diagnosis Date  . Bladder cancer ([place]) 05/2015  . CAD (coronary artery disease)   . Hyperlipidemia   . Hypertension   . Kidney stones   . Prostate CA [facility])     Past Surgical History:  Procedure Laterality Date  . CARDIAC SURGERY    . CORONARY ARTERY BYPASS GRAFT    . PENILE PROSTHESIS IMPLANT    . PROSTATE SURGERY       reports that he has never smoked. He has never used smokeless tobacco. He reports that he uses drugs, including Marijuana. He reports that he does not drink alcohol.  Allergies  Allergen Reactions  . Gabapentin Other (See Comments)    Shaking  . Other Other (See Comments)    Antihistamines-unknown reaction    Family History  Problem Relation Age of Onset  . Congestive Heart Failure Mother   . Kidney failure Sister   . Hypertension Sister   . Diabetes Mellitus II Brother   .  CAD Brother      Prior to Admission medications   Medication Sig Start Date End Date Taking? Authorizing Provider  acetaminophen (TYLENOL) 500 MG tablet Take 500-1,000 mg by mouth every 6 (six) hours as needed for mild pain.    Yes Historical Provider, MD  Multiple Vitamin (MULTIVITAMIN) capsule Take 1 capsule by mouth daily.    Yes Historical Provider, MD  nitroGLYCERIN (NITRODUR - DOSED IN MG/24 HR) 0.4 mg/hr patch Place 0.4 mg onto the skin every morning.  08/08/15  Yes Historical Provider, MD  rosuvastatin (CRESTOR) 10 MG tablet Take 10 mg by mouth every evening.  11/10/15  Yes Historical Provider, MD  traMADol (ULTRAM) 50 MG tablet Take 1 tablet (50 mg total) by mouth every 6 (six) hours as needed for moderate pain. 08/16/16  Yes Daniel J Angiulli, PA-C  vitamin C (ASCORBIC ACID) 500 MG tablet Take 500 mg by mouth.    Yes Historical Provider, MD  diphenhydrAMINE-zinc acetate (BENADRYL) cream Apply topically 4 (four) times daily -  with meals and at bedtime. Applied to affected areas Patient not taking: Reported on 09/01/2016 08/16/16   Lavon Paganini Angiulli, PA-C  lidocaine (LIDODERM) 5 % Place 1 patch onto the skin daily. Remove & Discard patch within 12 hours or as directed by MD Patient not taking: Reported on 09/01/2016 08/16/16   Cathlyn Parsons, PA-C    Physical Exam:  Constitutional: NAD, calm, comfortable Vitals:   09/01/16 1835  09/01/16 1857 09/01/16 1900 09/01/16 2030  BP: 126/65  120/85 136/90  Pulse: 84  79 78  Resp: 18  16 20   Temp:      TempSrc:      SpO2: 100%  98% 97%  Weight:  124.7 kg (275 lb)    Height:  6\' 3"  (1.905 m)     Eyes: PERRL, lids and conjunctivae normal ENMT: Mucous membranes are dry. Posterior pharynx clear of any exudate or lesions. Neck: normal, supple, no masses, no thyromegaly Respiratory: clear to auscultation bilaterally, no wheezing, no crackles. Normal respiratory effort. No accessory muscle use.  Cardiovascular: Regular rate and rhythm, no murmurs  / rubs / gallops. No extremity edema. 2+ pedal pulses. No carotid bruits.  Abdomen: Soft, positive suprapubic enderness, no guarding/rebound/masses palpated. No hepatosplenomegaly. Bowel sounds positive.  Musculoskeletal: no clubbing / cyanosis. No joint deformity upper and lower extremities. Good ROM, no contractures. Normal muscle tone.  Skin: no rashes, lesions, ulcers. No induration Neurologic: CN 2-12 grossly intact. Sensation intact, DTR normal. Generalized weakness.  Psychiatric: Normal judgment and insight. Alert and oriented x 4. Normal mood.     Labs on Admission: I have personally reviewed following labs and imaging studies  CBC:  Recent Labs Lab 09/01/16 1724  WBC 12.0*  NEUTROABS 7.8*  HGB 12.0*  HCT 37.0*  MCV 86.9  PLT [id number]   Basic Metabolic Panel:  Recent Labs Lab 09/01/16 1724  NA 138  K 4.7  CL 107  CO2 21*  GLUCOSE 103*  BUN 16  CREATININE 1.25*  CALCIUM 9.7   GFR: Estimated Creatinine Clearance: 63.7 mL/min (by C-G formula based on SCr of 1.25 mg/dL (H)). Liver Function Tests:  Recent Labs Lab 09/01/16 1724  AST 23  ALT 18  ALKPHOS 53  BILITOT 0.6  PROT 7.9  ALBUMIN 3.1*   No results for input(s): LIPASE, AMYLASE in the last 168 hours. No results for input(s): AMMONIA in the last 168 hours. Coagulation Profile: No results for input(s): INR, PROTIME in the last 168 hours. Cardiac Enzymes: No results for input(s): CKTOTAL, CKMB, CKMBINDEX, TROPONINI in the last 168 hours. BNP (last 3 results) No results for input(s): PROBNP in the last 8760 hours. HbA1C: No results for input(s): HGBA1C in the last 72 hours. CBG: No results for input(s): GLUCAP in the last 168 hours. Lipid Profile: No results for input(s): CHOL, HDL, LDLCALC, TRIG, CHOLHDL, LDLDIRECT in the last 72 hours. Thyroid Function Tests: No results for input(s): TSH, T4TOTAL, FREET4, T3FREE, THYROIDAB in the last 72 hours. Anemia Panel: No results for input(s): VITAMINB12,  FOLATE, FERRITIN, TIBC, IRON, RETICCTPCT in the last 72 hours. Urine analysis:    Component Value Date/Time   COLORURINE AMBER (A) 09/01/2016 1739   APPEARANCEUR TURBID (A) 09/01/2016 1739   LABSPEC 1.018 09/01/2016 1739   PHURINE 7.5 09/01/2016 1739   GLUCOSEU NEGATIVE 09/01/2016 1739   HGBUR LARGE (A) 09/01/2016 1739   BILIRUBINUR NEGATIVE 09/01/2016 1739   KETONESUR 15 (A) 09/01/2016 1739   PROTEINUR >300 (A) 09/01/2016 1739   UROBILINOGEN 0.2 09/16/2015 1741   NITRITE POSITIVE (A) 09/01/2016 1739   LEUKOCYTESUR LARGE (A) 09/01/2016 1739    Radiological Exams on Admission: Dg Chest 2 View  Result Date: 09/01/2016 CLINICAL DATA:  Shortness of breath EXAM: CHEST  2 VIEW COMPARISON:  04/19/2015 chest radiograph. FINDINGS: Sternotomy wires appear aligned and intact. CABG clips overlie the mediastinum. Stable cardiomediastinal silhouette with normal heart size. No pneumothorax. Small left pleural effusion. No right pleural effusion.  No overt pulmonary edema. Mild bibasilar curvilinear opacities. IMPRESSION: 1. No overt pulmonary edema. 2. Small left pleural effusion. 3. Mild curvilinear bibasilar opacities, favor scarring or atelectasis. Electronically Signed   By: Ilona Sorrel [name]   On: 09/01/2016 19:33    EKG: Independently reviewed. Vent. rate 90 BPM PR interval 160 ms QRS duration 124 ms QT/QTc 398/486 ms P-R-T axes 57 0 54 Normal sinus rhythm Right bundle branch block Abnormal ECG  Assessment/Plan Principal Problem:   UTI (lower urinary tract infection) Most recent culture revealed Pseudomonas aeruginosa sensitive to all antibiotics. Continue cefepime per pharmacy. Gentle IV hydration. Follow-up blood cultures and sensitivity. Follow-up urine culture and sensitivity. Pyridium for dysuria.  Active Problems:   Benign essential HTN Currently on antihypertensives. Monitor blood pressure periodically. Continue last saw modifications.    Coronary artery  disease involving coronary bypass graft of native heart w/o angina pectoris Continue Crestor. Aspirin 81 mg by mouth daily. DM nitroglycerin as needed. The patient was discharged last month on metoprolol succinate 25 mg by mouth daily. Not sure if he is taking it at this time.     CKD (chronic kidney disease) Monitor BUN/creatinine and electrolytes.    Bladder cancer [facility])   Prostate cancer history.   BPH (benign prostatic hyperplasia) Continue to schedule follow-up with urology at [facility].      DVT prophylaxis: Lovenox SQ. Code Status: Full code. Family Communication: His wife was present in the room. Disposition Plan: Admit for Iv antibiotic therapy for several days. Consults called:  Admission status: Inpatient/MedSurg.   Reubin Milan MD [facility] Pager [phone].  If 7PM-7AM, please contact night-coverage [email or web address] Password [facility]  09/01/2016, 9:48 PM

## 2016-09-02 ENCOUNTER — Inpatient Hospital Stay (HOSPITAL_COMMUNITY): Payer: Medicare Other

## 2016-09-02 DIAGNOSIS — N189 Chronic kidney disease, unspecified: Secondary | ICD-10-CM

## 2016-09-02 DIAGNOSIS — N390 Urinary tract infection, site not specified: Principal | ICD-10-CM

## 2016-09-02 DIAGNOSIS — N179 Acute kidney failure, unspecified: Secondary | ICD-10-CM

## 2016-09-02 DIAGNOSIS — I10 Essential (primary) hypertension: Secondary | ICD-10-CM

## 2016-09-02 LAB — URINE CULTURE

## 2016-09-02 LAB — BLOOD CULTURE ID PANEL (REFLEXED)
Acinetobacter baumannii: NOT DETECTED
CANDIDA GLABRATA: NOT DETECTED
Candida albicans: NOT DETECTED
Candida krusei: NOT DETECTED
Candida parapsilosis: NOT DETECTED
Candida tropicalis: NOT DETECTED
ENTEROBACTER CLOACAE COMPLEX: NOT DETECTED
ENTEROCOCCUS SPECIES: NOT DETECTED
Enterobacteriaceae species: NOT DETECTED
Escherichia coli: NOT DETECTED
HAEMOPHILUS INFLUENZAE: NOT DETECTED
Klebsiella oxytoca: NOT DETECTED
Klebsiella pneumoniae: NOT DETECTED
LISTERIA MONOCYTOGENES: NOT DETECTED
METHICILLIN RESISTANCE: DETECTED — AB
NEISSERIA MENINGITIDIS: NOT DETECTED
PROTEUS SPECIES: NOT DETECTED
Pseudomonas aeruginosa: NOT DETECTED
STAPHYLOCOCCUS AUREUS BCID: NOT DETECTED
STAPHYLOCOCCUS SPECIES: DETECTED — AB
STREPTOCOCCUS AGALACTIAE: NOT DETECTED
STREPTOCOCCUS SPECIES: NOT DETECTED
Serratia marcescens: NOT DETECTED
Streptococcus pneumoniae: NOT DETECTED
Streptococcus pyogenes: NOT DETECTED

## 2016-09-02 LAB — CBC WITH DIFFERENTIAL/PLATELET
BASOS ABS: 0 10*3/uL (ref 0.0–0.1)
Basophils Relative: 0 %
EOS ABS: 0.4 10*3/uL (ref 0.0–0.7)
EOS PCT: 4 %
HCT: 31.3 % — ABNORMAL LOW (ref 39.0–52.0)
Hemoglobin: 9.9 g/dL — ABNORMAL LOW (ref 13.0–17.0)
LYMPHS ABS: 2.1 10*3/uL (ref 0.7–4.0)
Lymphocytes Relative: 22 %
MCH: 28.2 pg (ref 26.0–34.0)
MCHC: 31.6 g/dL (ref 30.0–36.0)
MCV: 89.2 fL (ref 78.0–100.0)
Monocytes Absolute: 0.9 10*3/uL (ref 0.1–1.0)
Monocytes Relative: 9 %
Neutro Abs: 6.3 10*3/uL (ref 1.7–7.7)
Neutrophils Relative %: 65 %
PLATELETS: 310 10*3/uL (ref 150–400)
RBC: 3.51 MIL/uL — AB (ref 4.22–5.81)
RDW: 15.3 % (ref 11.5–15.5)
WBC: 9.7 10*3/uL (ref 4.0–10.5)

## 2016-09-02 LAB — BASIC METABOLIC PANEL
Anion gap: 6 (ref 5–15)
BUN: 16 mg/dL (ref 6–20)
CO2: 23 mmol/L (ref 22–32)
CREATININE: 1.31 mg/dL — AB (ref 0.61–1.24)
Calcium: 8.9 mg/dL (ref 8.9–10.3)
Chloride: 109 mmol/L (ref 101–111)
GFR calc Af Amer: 56 mL/min — ABNORMAL LOW (ref >60)
GFR, EST NON AFRICAN AMERICAN: 49 mL/min — AB (ref >60)
Glucose, Bld: 97 mg/dL (ref 65–99)
Potassium: 4.3 mmol/L (ref 3.5–5.1)
SODIUM: 138 mmol/L (ref 135–145)

## 2016-09-02 MED ORDER — PHENAZOPYRIDINE HCL 200 MG PO TABS
200.0000 mg | ORAL_TABLET | Freq: Three times a day (TID) | ORAL | Status: AC
  Administered 2016-09-02 – 2016-09-03 (×6): 200 mg via ORAL
  Filled 2016-09-02 (×6): qty 1

## 2016-09-02 MED ORDER — CHLORHEXIDINE GLUCONATE CLOTH 2 % EX PADS
6.0000 | MEDICATED_PAD | Freq: Every day | CUTANEOUS | Status: DC
  Administered 2016-09-03: 6 via TOPICAL

## 2016-09-02 MED ORDER — HYDRALAZINE HCL 20 MG/ML IJ SOLN: 10.0000 mg | Freq: Four times a day (QID) | INTRAMUSCULAR | Status: DC | PRN

## 2016-09-02 MED ORDER — MUPIROCIN 2 % EX OINT
1.0000 "application " | TOPICAL_OINTMENT | Freq: Two times a day (BID) | CUTANEOUS | Status: DC
  Administered 2016-09-02 – 2016-09-03 (×2): 1 via NASAL
  Filled 2016-09-02: qty 22

## 2016-09-02 MED ORDER — ASPIRIN EC 81 MG PO TBEC
81.0000 mg | DELAYED_RELEASE_TABLET | Freq: Every day | ORAL | Status: DC
  Administered 2016-09-02 – 2016-09-04 (×3): 81 mg via ORAL
  Filled 2016-09-02 (×3): qty 1

## 2016-09-02 MED ORDER — POLYETHYLENE GLYCOL 3350 17 G PO PACK
17.0000 g | PACK | Freq: Every day | ORAL | Status: DC
  Administered 2016-09-02 – 2016-09-03 (×2): 17 g via ORAL
  Filled 2016-09-02 (×3): qty 1

## 2016-09-02 NOTE — Progress Notes (Signed)
Patient's last BM 9/22.  Patient is requesting stool softener.  MD notified.

## 2016-09-02 NOTE — Progress Notes (Signed)
  PHARMACY - PHYSICIAN COMMUNICATION CRITICAL VALUE ALERT - BLOOD CULTURE IDENTIFICATION (BCID)  Results for orders placed or performed during the hospital encounter of 09/01/16  Blood Culture ID Panel (Reflexed) (Collected: 09/01/2016  7:50 PM)  Result Value Ref Range   Enterococcus species NOT DETECTED NOT DETECTED   Listeria monocytogenes NOT DETECTED NOT DETECTED   Staphylococcus species DETECTED (A) NOT DETECTED   Staphylococcus aureus NOT DETECTED NOT DETECTED   Methicillin resistance DETECTED (A) NOT DETECTED   Streptococcus species NOT DETECTED NOT DETECTED   Streptococcus agalactiae NOT DETECTED NOT DETECTED   Streptococcus pneumoniae NOT DETECTED NOT DETECTED   Streptococcus pyogenes NOT DETECTED NOT DETECTED   Acinetobacter baumannii NOT DETECTED NOT DETECTED   Enterobacteriaceae species NOT DETECTED NOT DETECTED   Enterobacter cloacae complex NOT DETECTED NOT DETECTED   Escherichia coli NOT DETECTED NOT DETECTED   Klebsiella oxytoca NOT DETECTED NOT DETECTED   Klebsiella pneumoniae NOT DETECTED NOT DETECTED   Proteus species NOT DETECTED NOT DETECTED   Serratia marcescens NOT DETECTED NOT DETECTED   Haemophilus influenzae NOT DETECTED NOT DETECTED   Neisseria meningitidis NOT DETECTED NOT DETECTED   Pseudomonas aeruginosa NOT DETECTED NOT DETECTED   Candida albicans NOT DETECTED NOT DETECTED   Candida glabrata NOT DETECTED NOT DETECTED   Candida krusei NOT DETECTED NOT DETECTED   Candida parapsilosis NOT DETECTED NOT DETECTED   Candida tropicalis NOT DETECTED NOT DETECTED    Name of physician (or Provider) Contacted: Olevia Bowens  Changes to prescribed antibiotics required: MRSE on BCID, 1/2 BCx with GPC in Aerobic bottle. Likely contaminant, currently on cefepime for UTI. Continue current plan with no changes.  Elicia Lamp, PharmD, BCPS Clinical Pharmacist 09/02/2016 9:16 PM

## 2016-09-02 NOTE — Progress Notes (Signed)
PROGRESS NOTE                                                                                                                                                                                                             Patient Demographics:    [name], is a 82 y.o. male, DOB - 1931/04/11, RA:[id number]  Admit date - 09/01/2016   Admitting Physician Reubin Milan, MD  Outpatient Primary MD for the patient is [name], MD  LOS - 1  Outpatient Specialists: Dr Felicie Morn , urology at [facility] Complaint  Patient presents with  . Cough  . Weakness  . Chills       Brief Narrative   82 year old male with history of bladder and prostate cancer, CAD, hypertension, hyperlipidemia with recent hospitalization for hydronephrosis presented to the ED with dysuria, suprapubic pain and dyspnea on exertion for past few days. Patient was discharged from inpatient rehabilitation 2 weeks back. He then saw his urologist on 9/21 and underwent cystoscopy. Since then he has been having dysuria with suprapubic pain, nonproductive cough and dyspnea on exertion. Denies any fevers or chills but reports night sweats and fatigue. Has poor appetite. He was taking daily ciprofloxacin but ran out. In the ED he was afebrile, blood work showed WBC of 12,000, creatinine of 1.25, elevated lactic acid of 2.5 and UA suggestive of UTI (see if he can bacteria and pyuria) Patient received a dose of IV cefepime and admitted to hospitalist service.   Subjective:    Patient still has dysuria with suprapubic pain. Reports that he is urinating less than usual  Assessment  & Plan :    Principal Problem:   UTI (lower urinary tract infection) Recent cystoscopy. Continue empiric Rocephin and IV fluids. Follow urine culture. Given persistent suprapubic tenderness and low urine output, obtain renal ultrasound. Monitor strict I/O Pain control with  morphine and tramadol when necessary.    Active Problems:   Benign essential HTN Stable. Resume home medications    Coronary artery disease involving coronary bypass graft of native heart without angina pectoris Continue aspirin and statin.    BPH (benign prostatic hyperplasia) Not on medications    Acute on CKD (chronic kidney disease) stage II Likely secondary to dehydration and UTI. Gentle hydration.    Bladder cancer [facility]) Follows with [facility]rologist at [facility].  cough with shortness of breath No signs of pneumonia on chest x-ray. Continue empiric antibiotics.   Code Status : Full code  Family Communication  : None at bedside  Disposition Plan  : Home once improved  Barriers For Discharge :Active symptoms  Consults  : None  Procedures  : Renal ultrasound  DVT Prophylaxis  :  Lovenox -   Lab Results  Component Value Date   PLT 310 09/02/2016    Antibiotics  :   Anti-infectives    Start     Dose/Rate Route Frequency Ordered Stop   09/02/16 2000  ceFEPIme (MAXIPIME) 2 g in dextrose 5 % 50 mL IVPB     2 g 100 mL/hr over 30 Minutes Intravenous Every 24 hours 09/01/16 1850     09/01/16 1900  ceFEPIme (MAXIPIME) 2 g in dextrose 5 % 50 mL IVPB     2 g 100 mL/hr over 30 Minutes Intravenous  Once 09/01/16 1847 09/01/16 2034        Objective:   Vitals:   09/01/16 2307 09/02/16 0538 09/02/16 0600 09/02/16 1025  BP: 131/85 (!) 157/109 (!) 142/88 (!) 149/79  Pulse: 73 79  88  Resp: 18 18  18   Temp: 98.9 F (37.2 C) 98.7 F (37.1 C)  98.2 F (36.8 C)  TempSrc: Oral Oral  Oral  SpO2: 100% 98%  97%  Weight:      Height:        Wt Readings from Last 3 Encounters:  09/01/16 124.7 kg (275 lb)  08/16/16 123.8 kg (273 lb)  08/02/16 129.8 kg (286 lb 3.2 oz)     Intake/Output Summary (Last 24 hours) at 09/02/16 1345 Last data filed at 09/02/16 1000  Gross per 24 hour  Intake             1878 ml  Output                0 ml  Net             1878 ml      Physical Exam  Gen: not in distress HEENT: no pallor, moist mucosa, supple neck Chest: clear b/l, no added sounds CVS: N S1&S2, no murmurs, rubs or gallop GI: soft,  ND, BS+Suprapubic tenderness  Musculoskeletal: warm, no edema [id number] and oriented   Data Review:    CBC  Recent Labs Lab 09/01/16 1724 09/02/16 0425  WBC 12.0* 9.7  HGB 12.0* 9.9*  HCT 37.0* 31.3*  PLT 354 310  MCV 86.9 89.2  MCH 28.2 28.2  MCHC 32.4 31.6  RDW 15.2 15.3  LYMPHSABS 2.9 2.1  MONOABS 1.1* 0.9  EOSABS 0.3 0.4  BASOSABS 0.0 0.0    Chemistries   Recent Labs Lab 09/01/16 1724 09/02/16 0425  NA 138 138  K 4.7 4.3  CL 107 109  CO2 21* 23  GLUCOSE 103* 97  BUN 16 16  CREATININE 1.25* 1.31*  CALCIUM 9.7 8.9  AST 23  --   ALT 18  --   ALKPHOS 53  --   BILITOT 0.6  --    ------------------------------------------------------------------------------------------------------------------ No results for input(s): CHOL, HDL, LDLCALC, TRIG, CHOLHDL, LDLDIRECT in the last 72 hours.  No results found for: HGBA1C ------------------------------------------------------------------------------------------------------------------ No results for input(s): TSH, T4TOTAL, T3FREE, THYROIDAB in the last 72 hours.  Invalid input(s): FREET3 ------------------------------------------------------------------------------------------------------------------ No results for input(s): VITAMINB12, FOLATE, FERRITIN, TIBC, IRON, RETICCTPCT in the last 72 hours.  Coagulation profile No results for input(s): INR, PROTIME in the  last 168 hours.  No results for input(s): DDIMER in the last 72 hours.  Cardiac Enzymes No results for input(s): CKMB, TROPONINI, MYOGLOBIN in the last 168 hours.  Invalid input(s): CK ------------------------------------------------------------------------------------------------------------------ No results found for: BNP  Inpatient Medications  Scheduled Meds: .  aspirin EC  81 mg Oral Daily  . ceFEPime (MAXIPIME) IV  2 g Intravenous Q24H  . enoxaparin (LOVENOX) injection  40 mg Subcutaneous QHS  . famotidine  20 mg Oral BID  . multivitamin with minerals  1 tablet Oral Daily  . nitroGLYCERIN  0.4 mg Transdermal Daily  . phenazopyridine  200 mg Oral TID WC  . rosuvastatin  10 mg Oral QPM   Continuous Infusions: . sodium chloride 88 mL/hr at 09/02/16 1018   PRN Meds:.acetaminophen, morphine injection, ondansetron **OR** ondansetron (ZOFRAN) IV, traMADol  Micro Results Recent Results (from the past 240 hour(s))  Blood culture (routine x 2)     Status: None (Preliminary result)   Collection Time: 09/01/16  6:50 PM  Result Value Ref Range Status   Specimen Description BLOOD RIGHT ANTECUBITAL  Final   Special Requests BOTTLES DRAWN AEROBIC AND ANAEROBIC 8CC  Final   Culture NO GROWTH < 24 HOURS  Final   Report Status PENDING  Incomplete  Blood culture (routine x 2)     Status: None (Preliminary result)   Collection Time: 09/01/16  7:50 PM  Result Value Ref Range Status   Specimen Description BLOOD LEFT ARM  Final   Special Requests BOTTLES DRAWN AEROBIC AND ANAEROBIC 5CC EA  Final   Culture NO GROWTH < 24 HOURS  Final   Report Status PENDING  Incomplete    Radiology Reports Dg Chest 2 View  Result Date: 09/01/2016 CLINICAL DATA:  Shortness of breath EXAM: CHEST  2 VIEW COMPARISON:  04/19/2015 chest radiograph. FINDINGS: Sternotomy wires appear aligned and intact. CABG clips overlie the mediastinum. Stable cardiomediastinal silhouette with normal heart size. No pneumothorax. Small left pleural effusion. No right pleural effusion. No overt pulmonary edema. Mild bibasilar curvilinear opacities. IMPRESSION: 1. No overt pulmonary edema. 2. Small left pleural effusion. 3. Mild curvilinear bibasilar opacities, favor scarring or atelectasis. Electronically Signed   By: Ilona Sorrel [name]   On: 09/01/2016 19:33   US Scrotum  Result Date:  08/05/2016 CLINICAL DATA:  RIGHT testicular pain beginning this afternoon, resolution of symptoms after catheter removal. History of prostate cancer, penile prosthesis. EXAM: SCROTAL ULTRASOUND DOPPLER ULTRASOUND OF THE TESTICLES TECHNIQUE: Complete ultrasound examination of the testicles, epididymis, and other scrotal structures was performed. Color and spectral Doppler ultrasound were also utilized to evaluate blood flow to the testicles. COMPARISON:  CT abdomen and pelvis July 28, 2016 FINDINGS: Right testicle Measurements: 4.4 x 2.1 x 2.4 cm. No mass or microlithiasis visualized. Left testicle Measurements: 4.3 x 2.2 x 1.5 cm. No mass or microlithiasis visualized. Right epididymis:  Normal in size and vascularity. Left epididymis: 7 mm anechoic cyst. Normal and size and vascularity Hydrocele:  Complex small bilateral hydroceles. Varicocele:  None visualized. Pulsed Doppler interrogation of both testes demonstrates normal low resistance arterial and venous waveforms bilaterally. Penile prosthesis identified. IMPRESSION: Small complex bilateral hydroceles.  No acute testicular process. Penile implant. Electronically Signed   By: Elon Alas [name]   On: 08/05/2016 01:11   [place] Art/ven Flow Abd Pelv Doppler  Result Date: 08/05/2016 CLINICAL DATA:  RIGHT testicular pain beginning this afternoon, resolution of symptoms after catheter removal. History of prostate cancer, penile prosthesis. EXAM: SCROTAL ULTRASOUND DOPPLER ULTRASOUND  OF THE TESTICLES TECHNIQUE: Complete ultrasound examination of the testicles, epididymis, and other scrotal structures was performed. Color and spectral Doppler ultrasound were also utilized to evaluate blood flow to the testicles. COMPARISON:  CT abdomen and pelvis July 28, 2016 FINDINGS: Right testicle Measurements: 4.4 x 2.1 x 2.4 cm. No mass or microlithiasis visualized. Left testicle Measurements: 4.3 x 2.2 x 1.5 cm. No mass or microlithiasis visualized. Right epididymis:   Normal in size and vascularity. Left epididymis: 7 mm anechoic cyst. Normal and size and vascularity Hydrocele:  Complex small bilateral hydroceles. Varicocele:  None visualized. Pulsed Doppler interrogation of both testes demonstrates normal low resistance arterial and venous waveforms bilaterally. Penile prosthesis identified. IMPRESSION: Small complex bilateral hydroceles.  No acute testicular process. Penile implant. Electronically Signed   By: Elon Alas [name]   On: 08/05/2016 01:11    Time Spent in minutes  25   Louellen Molder M.D on 09/02/2016 at 1:45 PM  Between 7am to 7pm - Pager - [phone]  After 7pm go to [email or web address] - password [facility]  [facility] -  Office  [phone]

## 2016-09-03 DIAGNOSIS — K5909 Other constipation: Secondary | ICD-10-CM

## 2016-09-03 LAB — CBC WITH DIFFERENTIAL/PLATELET
Basophils Absolute: 0 10*3/uL (ref 0.0–0.1)
Basophils Relative: 1 %
EOS ABS: 0.4 10*3/uL (ref 0.0–0.7)
EOS PCT: 5 %
HCT: 30.1 % — ABNORMAL LOW (ref 39.0–52.0)
Hemoglobin: 9.5 g/dL — ABNORMAL LOW (ref 13.0–17.0)
LYMPHS ABS: 1.9 10*3/uL (ref 0.7–4.0)
LYMPHS PCT: 22 %
MCH: 28.3 pg (ref 26.0–34.0)
MCHC: 31.6 g/dL (ref 30.0–36.0)
MCV: 89.6 fL (ref 78.0–100.0)
MONO ABS: 0.7 10*3/uL (ref 0.1–1.0)
MONOS PCT: 8 %
Neutro Abs: 5.5 10*3/uL (ref 1.7–7.7)
Neutrophils Relative %: 64 %
PLATELETS: 293 10*3/uL (ref 150–400)
RBC: 3.36 MIL/uL — ABNORMAL LOW (ref 4.22–5.81)
RDW: 15.3 % (ref 11.5–15.5)
WBC: 8.6 10*3/uL (ref 4.0–10.5)

## 2016-09-03 LAB — BASIC METABOLIC PANEL
Anion gap: 5 (ref 5–15)
BUN: 15 mg/dL (ref 6–20)
CO2: 22 mmol/L (ref 22–32)
CREATININE: 1.22 mg/dL (ref 0.61–1.24)
Calcium: 8.9 mg/dL (ref 8.9–10.3)
Chloride: 110 mmol/L (ref 101–111)
GFR calc Af Amer: >60 mL/min (ref >60)
GFR, EST NON AFRICAN AMERICAN: 53 mL/min — AB (ref >60)
GLUCOSE: 85 mg/dL (ref 65–99)
Potassium: 4.6 mmol/L (ref 3.5–5.1)
SODIUM: 137 mmol/L (ref 135–145)

## 2016-09-03 MED ORDER — ENOXAPARIN SODIUM 80 MG/0.8ML ~~LOC~~ SOLN
0.5000 mg/kg | Freq: Every day | SUBCUTANEOUS | Status: DC
  Administered 2016-09-03: 65 mg via SUBCUTANEOUS
  Filled 2016-09-03: qty 0.8

## 2016-09-03 MED ORDER — BISACODYL 10 MG RE SUPP
10.0000 mg | Freq: Once | RECTAL | Status: AC
  Administered 2016-09-03: 10 mg via RECTAL
  Filled 2016-09-03: qty 1

## 2016-09-03 NOTE — Progress Notes (Signed)
PROGRESS NOTE                                                                                                                                                                                                             Patient Demographics:    [name], is a 82 y.o. male, DOB - [birth year], [id number]  Admit date - 09/01/2016   Admitting Physician Reubin Milan, MD  Outpatient Primary MD for the patient is [name], MD  LOS - 2  Outpatient Specialists: Dr Felicie Morn , urology at [facility] Complaint  Patient presents with  . Cough  . Weakness  . Chills       Brief Narrative   82 year old male with history of bladder and prostate cancer, CAD, hypertension, hyperlipidemia with recent hospitalization for hydronephrosis presented to the ED with dysuria, suprapubic pain and dyspnea on exertion for past few days. Patient was discharged from inpatient rehabilitation 2 weeks back. He then saw his urologist on 9/21 and underwent cystoscopy. Since then he has been having dysuria with suprapubic pain, nonproductive cough and dyspnea on exertion. Denies any fevers or chills but reports night sweats and fatigue. Has poor appetite. He was taking daily ciprofloxacin but ran out. In the ED he was afebrile, blood work showed WBC of 12,000, creatinine of 1.25, elevated lactic acid of 2.5 and UA suggestive of UTI (see if he can bacteria and pyuria) Patient received a dose of IV cefepime and admitted to hospitalist service.   Subjective:   Still has mild dysuria and some suprapubic pain (better than admission). Still has not had a bowel movement   Assessment  & Plan :    Principal Problem:   UTI (lower urinary tract infection) Recent cystoscopy. Continue empiric Rocephin and IV fluids.Urine culture growing mixed bacteria.  Renal ultrasound without hydronephrosis, shows stable nonobstructive right kidney stone. Pain  control with morphine and tramadol when necessary.    Active Problems:   Benign essential HTN Table on home medication.    Coronary artery disease involving coronary bypass graft of native heart without angina pectoris Continue aspirin and statin.    BPH (benign prostatic hyperplasia) Not on medications    Acute on CKD (chronic kidney disease) stage II Likely secondary to dehydration and UTI. Better with hydration.    Bladder cancer [facility]) Follows with urologist  at [facility].   cough with shortness of breath No signs of pneumonia on chest x-ray. Resolved.  Coag negative staph aureus on blood culture 1/2 Possibly a contaminant. Will follow.  Code Status : Full code  Family Communication  : None at bedside. Wife updated on 9/24  Disposition Plan  : Home once improved  Barriers For Discharge :Active symptoms  Consults  : None  Procedures  : Renal ultrasound  DVT Prophylaxis  :  Lovenox -   Lab Results  Component Value Date   PLT 293 09/03/2016    Antibiotics  :   Anti-infectives    Start     Dose/Rate Route Frequency Ordered Stop   09/02/16 2000  ceFEPIme (MAXIPIME) 2 g in dextrose 5 % 50 mL IVPB     2 g 100 mL/hr over 30 Minutes Intravenous Every 24 hours 09/01/16 1850     09/01/16 1900  ceFEPIme (MAXIPIME) 2 g in dextrose 5 % 50 mL IVPB     2 g 100 mL/hr over 30 Minutes Intravenous  Once 09/01/16 1847 09/01/16 2034        Objective:   Vitals:   09/02/16 1720 09/02/16 2130 09/03/16 0614 09/03/16 0925  BP: 108/68 128/79 127/70 116/67  Pulse: 69 69 70 85  Resp: 16 18 19 19   Temp: 97.6 F (36.4 C) 99.1 F (37.3 C) 98.6 F (37 C) 97.4 F (36.3 C)  TempSrc: Axillary Oral Oral Oral  SpO2: 99% 96% 97% 96%  Weight:  126.1 kg (278 lb 1.6 oz)    Height:  6\' 3"  (1.905 m)      Wt Readings from Last 3 Encounters:  09/02/16 126.1 kg (278 lb 1.6 oz)  08/16/16 123.8 kg (273 lb)  08/02/16 129.8 kg (286 lb 3.2 oz)     Intake/Output Summary (Last 24 hours) at  09/03/16 1414 Last data filed at 09/03/16 1300  Gross per 24 hour  Intake             2648 ml  Output                0 ml  Net             2648 ml     Physical Exam  Gen: not in distress HEENT: no pallor, moist mucosa, supple neck Chest: clear b/l, no added sounds CVS: N S1&S2, no murmurs, rubs or gallop GI: soft,  ND, BS+Suprapubic tenderness  Musculoskeletal: warm, no edema [id number] and oriented   Data Review:    CBC  Recent Labs Lab 09/01/16 1724 09/02/16 0425 09/03/16 0733  WBC 12.0* 9.7 8.6  HGB 12.0* 9.9* 9.5*  HCT 37.0* 31.3* 30.1*  PLT 354 310 293  MCV 86.9 89.2 89.6  MCH 28.2 28.2 28.3  MCHC 32.4 31.6 31.6  RDW 15.2 15.3 15.3  LYMPHSABS 2.9 2.1 1.9  MONOABS 1.1* 0.9 0.7  EOSABS 0.3 0.4 0.4  BASOSABS 0.0 0.0 0.0    Chemistries   Recent Labs Lab 09/01/16 1724 09/02/16 0425 09/03/16 0733  NA 138 138 137  K 4.7 4.3 4.6  CL 107 109 110  CO2 21* 23 22  GLUCOSE 103* 97 85  BUN 16 16 15   CREATININE 1.25* 1.31* 1.22  CALCIUM 9.7 8.9 8.9  AST 23  --   --   ALT 18  --   --   ALKPHOS 53  --   --   BILITOT 0.6  --   --    ------------------------------------------------------------------------------------------------------------------ No  results for input(s): CHOL, HDL, LDLCALC, TRIG, CHOLHDL, LDLDIRECT in the last 72 hours.  No results found for: HGBA1C ------------------------------------------------------------------------------------------------------------------ No results for input(s): TSH, T4TOTAL, T3FREE, THYROIDAB in the last 72 hours.  Invalid input(s): FREET3 ------------------------------------------------------------------------------------------------------------------ No results for input(s): VITAMINB12, FOLATE, FERRITIN, TIBC, IRON, RETICCTPCT in the last 72 hours.  Coagulation profile No results for input(s): INR, PROTIME in the last 168 hours.  No results for input(s): DDIMER in the last 72 hours.  Cardiac Enzymes No  results for input(s): CKMB, TROPONINI, MYOGLOBIN in the last 168 hours.  Invalid input(s): CK ------------------------------------------------------------------------------------------------------------------ No results found for: BNP  Inpatient Medications  Scheduled Meds: . aspirin EC  81 mg Oral Daily  . ceFEPime (MAXIPIME) IV  2 g Intravenous Q24H  . enoxaparin (LOVENOX) injection  0.5 mg/kg Subcutaneous QHS  . famotidine  20 mg Oral BID  . multivitamin with minerals  1 tablet Oral Daily  . nitroGLYCERIN  0.4 mg Transdermal Daily  . phenazopyridine  200 mg Oral TID WC  . polyethylene glycol  17 g Oral Daily  . rosuvastatin  10 mg Oral QPM   Continuous Infusions: . sodium chloride 100 mL/hr at 09/03/16 0805   PRN Meds:.acetaminophen, hydrALAZINE, morphine injection, ondansetron **OR** ondansetron (ZOFRAN) IV, traMADol  Micro Results Recent Results (from the past 240 hour(s))  Urine culture     Status: Abnormal   Collection Time: 09/01/16  5:39 PM  Result Value Ref Range Status   Specimen Description URINE, RANDOM  Final   Special Requests NONE  Final   Culture MULTIPLE SPECIES PRESENT, SUGGEST RECOLLECTION (A)  Final   Report Status 09/02/2016 FINAL  Final  Blood culture (routine x 2)     Status: None (Preliminary result)   Collection Time: 09/01/16  6:50 PM  Result Value Ref Range Status   Specimen Description BLOOD RIGHT ANTECUBITAL  Final   Special Requests BOTTLES DRAWN AEROBIC AND ANAEROBIC 8CC  Final   Culture NO GROWTH < 24 HOURS  Final   Report Status PENDING  Incomplete  Blood culture (routine x 2)     Status: None (Preliminary result)   Collection Time: 09/01/16  7:50 PM  Result Value Ref Range Status   Specimen Description BLOOD LEFT ARM  Final   Special Requests BOTTLES DRAWN AEROBIC AND ANAEROBIC 5CC EA  Final   Culture  Setup Time   Final    GRAM POSITIVE COCCI IN CLUSTERS AEROBIC BOTTLE ONLY Organism ID to follow CRITICAL RESULT CALLED TO, READ BACK  BY AND VERIFIED WITH: H.BAIRD,PHRMD 09/02/16 @2111  BY V.WILKINS    Culture   Final    GRAM POSITIVE COCCI CULTURE REINCUBATED FOR BETTER GROWTH    Report Status PENDING  Incomplete  Blood Culture ID Panel (Reflexed)     Status: Abnormal   Collection Time: 09/01/16  7:50 PM  Result Value Ref Range Status   Enterococcus species NOT DETECTED NOT DETECTED Final   Listeria monocytogenes NOT DETECTED NOT DETECTED Final   Staphylococcus species DETECTED (A) NOT DETECTED Final    Comment: CRITICAL RESULT CALLED TO, READ BACK BY AND VERIFIED WITH: H.BAIRD,PHRMD 09/02/16 @2111  BY V.WILKINS    Staphylococcus aureus NOT DETECTED NOT DETECTED Final   Methicillin resistance DETECTED (A) NOT DETECTED Final    Comment: CRITICAL RESULT CALLED TO, READ BACK BY AND VERIFIED WITH: H.BAIRD,PHRMD 09/02/16 @2111  BY V.WILKINS    Streptococcus species NOT DETECTED NOT DETECTED Final   Streptococcus agalactiae NOT DETECTED NOT DETECTED Final   Streptococcus pneumoniae NOT DETECTED  NOT DETECTED Final   Streptococcus pyogenes NOT DETECTED NOT DETECTED Final   Acinetobacter baumannii NOT DETECTED NOT DETECTED Final   Enterobacteriaceae species NOT DETECTED NOT DETECTED Final   Enterobacter cloacae complex NOT DETECTED NOT DETECTED Final   Escherichia coli NOT DETECTED NOT DETECTED Final   Klebsiella oxytoca NOT DETECTED NOT DETECTED Final   Klebsiella pneumoniae NOT DETECTED NOT DETECTED Final   Proteus species NOT DETECTED NOT DETECTED Final   Serratia marcescens NOT DETECTED NOT DETECTED Final   Haemophilus influenzae NOT DETECTED NOT DETECTED Final   Neisseria meningitidis NOT DETECTED NOT DETECTED Final   Pseudomonas aeruginosa NOT DETECTED NOT DETECTED Final   Candida albicans NOT DETECTED NOT DETECTED Final   Candida glabrata NOT DETECTED NOT DETECTED Final   Candida krusei NOT DETECTED NOT DETECTED Final   Candida parapsilosis NOT DETECTED NOT DETECTED Final   Candida tropicalis NOT DETECTED NOT  DETECTED Final    Radiology Reports Dg Chest 2 View  Result Date: 09/01/2016 CLINICAL DATA:  Shortness of breath EXAM: CHEST  2 VIEW COMPARISON:  04/19/2015 chest radiograph. FINDINGS: Sternotomy wires appear aligned and intact. CABG clips overlie the mediastinum. Stable cardiomediastinal silhouette with normal heart size. No pneumothorax. Small left pleural effusion. No right pleural effusion. No overt pulmonary edema. Mild bibasilar curvilinear opacities. IMPRESSION: 1. No overt pulmonary edema. 2. Small left pleural effusion. 3. Mild curvilinear bibasilar opacities, favor scarring or atelectasis. Electronically Signed   By: Ilona Sorrel [name]   On: 09/01/2016 19:33   US Scrotum  Result Date: 08/05/2016 CLINICAL DATA:  RIGHT testicular pain beginning this afternoon, resolution of symptoms after catheter removal. History of prostate cancer, penile prosthesis. EXAM: SCROTAL ULTRASOUND DOPPLER ULTRASOUND OF THE TESTICLES TECHNIQUE: Complete ultrasound examination of the testicles, epididymis, and other scrotal structures was performed. Color and spectral Doppler ultrasound were also utilized to evaluate blood flow to the testicles. COMPARISON:  CT abdomen and pelvis July 28, 2016 FINDINGS: Right testicle Measurements: 4.4 x 2.1 x 2.4 cm. No mass or microlithiasis visualized. Left testicle Measurements: 4.3 x 2.2 x 1.5 cm. No mass or microlithiasis visualized. Right epididymis:  Normal in size and vascularity. Left epididymis: 7 mm anechoic cyst. Normal and size and vascularity Hydrocele:  Complex small bilateral hydroceles. Varicocele:  None visualized. Pulsed Doppler interrogation of both testes demonstrates normal low resistance arterial and venous waveforms bilaterally. Penile prosthesis identified. IMPRESSION: Small complex bilateral hydroceles.  No acute testicular process. Penile implant. Electronically Signed   By: Elon Alas [name]   On: 08/05/2016 01:11   US Renal  Result Date:  09/02/2016 CLINICAL DATA:  82 year old male with a history of hydronephrosis. EXAM: RENAL / URINARY TRACT ULTRASOUND COMPLETE COMPARISON:  CT 07/28/2016, CT 11/16/2014 FINDINGS: Right Kidney: Length: 9.7 cm. Echogenicity similar to that of the adjacent liver parenchyma. Cortical thinning, which was present upon comparison CT studies. Small hyperechoic focus at the inferior pole collecting system measures 9 mm, present on comparison CT. Very minimal enlargement of the right renal pelvis. Flow confirmed in the hilum of the right kidney. Left Kidney: Length: 10.6 cm. No left-sided hydronephrosis. No left nephrolithiasis. Bladder: Urinary bladder not visualized. IMPRESSION: There is minimal enlargement of the right renal pelvis, which, when compared across modalities to the prior CT, appears improved. Unchanged position of known nonobstructive stone in the inferior collecting system of the right kidney. No left-sided hydronephrosis. Signed, Dulcy Fanny. Earleen Newport, DO [facility] Electronically Signed   By: Corrie Mckusick D.O.  On: 09/02/2016 17:18   [place] Art/ven Flow Abd Pelv Doppler  Result Date: 08/05/2016 CLINICAL DATA:  RIGHT testicular pain beginning this afternoon, resolution of symptoms after catheter removal. History of prostate cancer, penile prosthesis. EXAM: SCROTAL ULTRASOUND DOPPLER ULTRASOUND OF THE TESTICLES TECHNIQUE: Complete ultrasound examination of the testicles, epididymis, and other scrotal structures was performed. Color and spectral Doppler ultrasound were also utilized to evaluate blood flow to the testicles. COMPARISON:  CT abdomen and pelvis July 28, 2016 FINDINGS: Right testicle Measurements: 4.4 x 2.1 x 2.4 cm. No mass or microlithiasis visualized. Left testicle Measurements: 4.3 x 2.2 x 1.5 cm. No mass or microlithiasis visualized. Right epididymis:  Normal in size and vascularity. Left epididymis: 7 mm anechoic cyst. Normal and size  and vascularity Hydrocele:  Complex small bilateral hydroceles. Varicocele:  None visualized. Pulsed Doppler interrogation of both testes demonstrates normal low resistance arterial and venous waveforms bilaterally. Penile prosthesis identified. IMPRESSION: Small complex bilateral hydroceles.  No acute testicular process. Penile implant. Electronically Signed   By: Elon Alas [name]   On: 08/05/2016 01:11    Time Spent in minutes  25   Louellen Molder M.D on 09/03/2016 at 2:14 PM  Between 7am to 7pm - Pager - [phone]  After 7pm go to [email or web address] - password [facility]  [facility] -  Office  [phone]

## 2016-09-03 NOTE — Progress Notes (Signed)
Received call from [facility] with infection prevention. Patient does not need to be on contact due to staphylococcus aureus not detected. Also need to discontinue Bactroban & CHG wipes.  Dr. Delena Serve notified.

## 2016-09-03 NOTE — Progress Notes (Signed)
[facility]  Patient Status: Active (receiving services up to time of hospitalization)  [facility] is providing the following services: RN, PT, OT, ST and HHA  If patient discharges after hours, please call [phone].   [name] 09/03/2016, 1:02 PM

## 2016-09-04 DIAGNOSIS — N179 Acute kidney failure, unspecified: Secondary | ICD-10-CM

## 2016-09-04 DIAGNOSIS — C679 Malignant neoplasm of bladder, unspecified: Secondary | ICD-10-CM

## 2016-09-04 DIAGNOSIS — R531 Weakness: Secondary | ICD-10-CM

## 2016-09-04 LAB — CULTURE, BLOOD (ROUTINE X 2)

## 2016-09-04 MED ORDER — CIPROFLOXACIN HCL 500 MG PO TABS: 500.0000 mg | ORAL_TABLET | Freq: Two times a day (BID) | ORAL | 0 refills | Status: AC

## 2016-09-04 NOTE — Care Management Important Message (Signed)
Important Message  Patient Details  Name: [name] MRN: [id number] Date of Birth: [birth year]   Medicare Important Message Given:  Yes    [name] 09/04/2016, 11:00 AM

## 2016-09-04 NOTE — Progress Notes (Signed)
Discharge Note:   Patient alert and oriented X 4 and in no distress.  Patient and his wife given discharge instructions regarding signs and symptoms to report, medications, diet, activity, and upcoming appointments.  Patient's wife expressed concerns about the patient's swallowing as well as how to obtain adequate pain management for the patient.  She was referred to the telephone number on the discharge instructions and encouraged to call to obtain a PCP as soon as possible. She agreed to do so. She was also provided with the telephone number to the [facility] office where she could call with questions/concerns.  Patient confirmed that he had all of his personal belongings.  He was transported out via wheelchair by hospital volunteer.

## 2016-09-04 NOTE — Care Management Note (Addendum)
Case Management Note  Patient Details  Name: ALSON MCPHEETERS MRN: [id number] Date of Birth: [birth year]  Subjective/Objective:       CM following for progression and d/c planning.              Action/Plan: 09/04/2016 Met with pt re PCP needs, pt has multiple insurances therefore CM is unable to assist with PCP or clinic. This Pt was given [employer] phone number and info re using this number. Per conversation this pt also has an appointment at the [facility] in [place] next week to see a PCP. Will resume [place] services.   Expected Discharge Date:    09/04/2016              Expected Discharge Plan:  Home with [facility] services.   In-House Referral:  NA  Discharge planning Services  CM Consult  Post Acute Care Choice:  NA Choice offered to:  NA  DME Arranged:   NA DME Agency:   NA  HH Arranged:   RN, PT, OT, ST and aide. [place] Agency:   [facility]  Status of Service:  Completed, signed off  If discussed at [place] of Stay Meetings, dates discussed:    Additional Comments:  Adron Bene, RN 09/04/2016, 10:30 AM

## 2016-09-04 NOTE — Progress Notes (Signed)
Pharmacy Antibiotic Note  [name] is a 82 y.o. male admitted on 09/01/2016 with UTI.  Pharmacy has been consulted for cefepime dosing.   Blood cultures positive for CoNS- presumed contaminant. Urine cultures with multiple species.  Plan: - Cefepime 2gm IV Q24H - Noted plans to discharge patient on ciprofloxacin 500mg  PO BID x5 more days- agree with this plan  Height: 6\' 3"  (190.5 cm) Weight: 278 lb 7.1 oz (126.3 kg) IBW/kg (Calculated) : 84.5  Temp (24hrs), Avg:98.3 F (36.8 C), Min:98 F (36.7 C), Max:98.7 F (37.1 C)   Recent Labs Lab 09/01/16 1716 09/01/16 1724 09/01/16 1735 09/01/16 2000 09/02/16 0425 09/03/16 0733  WBC  --  12.0*  --   --  9.7 8.6  CREATININE  --  1.25*  --   --  1.31* 1.22  LATICACIDVEN 2.54*  --  1.98* 2.13*  --   --     Estimated Creatinine Clearance: 65.7 mL/min (by C-G formula based on SCr of 1.22 mg/dL).    Allergies  Allergen Reactions  . Gabapentin Other (See Comments)    Shaking  . Other Other (See Comments)    Antihistamines-unknown reaction    Antimicrobials this admission: Cefepime 9/23>>  Dose adjustments this admission: N/A  Microbiology results: 9/23 BCx2: CoNS 9/23 BCID: MRSE- likely contaminant 9/23 Ucx. Mult species  Thank you for allowing pharmacy to be a part of this patient's care.  Sunnie Odden D. Kooper Godshall, PharmD, BCPS Clinical Pharmacist Pager: [phone] 09/04/2016 10:43 AM

## 2016-09-04 NOTE — Discharge Instructions (Signed)

## 2016-09-04 NOTE — Discharge Summary (Signed)
Physician Discharge Summary  [name] [id number] DOB: 24-May-1931 DOA: 09/01/2016  PCP: [name], MD  Admit date: 09/01/2016 Discharge date: 09/04/2016  Admitted From: Home Disposition:  Home  Recommendations for Outpatient Follow-up:  Follow up with Urologist as scheduled  Patient is seeing [name] his cardiac issues and would like to establish care with a PCP in the community. Provided contact information to find a new PCP. Complete 7 day course of ciprofloxacin on 9/30.   Home Health: None Equipment/Devices: None   Discharge Condition: Stable CODE STATUS: Full code Diet recommendation: Heart Healthy       Discharge Diagnoses:  Principal Problem:   UTI (lower urinary tract infection)   Active Problems:   Benign essential HTN   Coronary artery disease involving coronary bypass graft of native heart without angina pectoris   BPH (benign prostatic hyperplasia)  Acute kidney injury   Bladder cancer (HCC)  Brief narrative/history of present illness 82 year old male with history of bladder and prostate cancer, CAD, hypertension, hyperlipidemia with recent hospitalization for hydronephrosis presented to the ED with dysuria, suprapubic pain and dyspnea on exertion for past few days. Patient was discharged from inpatient rehabilitation 2 weeks back. He then saw his urologist on 9/21 and underwent cystoscopy. Since then he has been having dysuria with suprapubic pain, nonproductive cough and dyspnea on exertion. Denies any fevers or chills but reports night sweats and fatigue. Has poor appetite. He was taking daily ciprofloxacin but ran out. In the ED he was afebrile, blood work showed WBC of 12,000, creatinine of 1.25, elevated lactic acid of 2.5 and UA suggestive of UTI (see if he can bacteria and pyuria) Patient received a dose of IV cefepime and admitted to hospitalist service.  Hospital course  Principal Problem:   UTI (lower urinary tract  infection) Recent cystoscopy. Given history of Pseudomonas UTI placed on empiric cefepime and IV fluids.Urine culture growing mixed bacteria.  Renal ultrasound without hydronephrosis, shows stable nonobstructive right kidney stone. Suprapubic pain now resolved. (I think this was also contributed by constipation which was relieved). Has mild dysuria. Remains afebrile. Given previous urine culture growing Pseudomonas sensitive to quinolones I will discharge him on oral ciprofloxacin to complete 7 day course of antibiotics.    Active Problems:   Benign essential HTN Stable on home medications.    Coronary artery disease involving coronary bypass graft of native heart without angina pectoris Continue aspirin and statin.    BPH (benign prostatic hyperplasia) Not on medications    Acute on CKD (chronic kidney disease) stage II Likely secondary to dehydration and UTI. Improved with hydration.    Bladder cancer [facility]) Follows with urologist at [facility].   cough with shortness of breath No signs of pneumonia on chest x-ray. Resolved.  Coag negative staph aureus on blood culture 1/2 Appears to be a contaminant.   Family Communication  : None at bedside. Wife updated on 9/24  Disposition Plan  : Home  Consults  : None  Procedures  : Renal ultrasound  Discharge Instructions     Medication List    STOP taking these medications   diphenhydrAMINE-zinc acetate cream Commonly known as:  BENADRYL   lidocaine 5 % Commonly known as:  LIDODERM     TAKE these medications   acetaminophen 500 MG tablet Commonly known as:  TYLENOL Take 500-1,000 mg by mouth every 6 (six) hours as needed for mild pain.   ciprofloxacin 500 MG tablet Commonly known as:  CIPRO Take 1 tablet (500  mg total) by mouth 2 (two) times daily.   multivitamin capsule Take 1 capsule by mouth daily.   nitroGLYCERIN 0.4 mg/hr patch Commonly known as:  NITRODUR - Dosed in mg/24 hr Place 0.4 mg onto  the skin every morning.   rosuvastatin 10 MG tablet Commonly known as:  CRESTOR Take 10 mg by mouth every evening.   traMADol 50 MG tablet Commonly known as:  ULTRAM Take 1 tablet (50 mg total) by mouth every 6 (six) hours as needed for moderate pain.   vitamin C 500 MG tablet Commonly known as:  ASCORBIC ACID Take 500 mg by mouth.      Follow-up Information    [name], MD .   Specialty:  Cardiology Why:  For the names of local doctors accepting new patients call [phone] [place] information: [address] [address] [phone]          Allergies  Allergen Reactions  . Gabapentin Other (See Comments)    Shaking  . Other Other (See Comments)    Antihistamines-unknown reaction     Procedures/Studies: Dg Chest 2 View  Result Date: 09/01/2016 CLINICAL DATA:  Shortness of breath EXAM: CHEST  2 VIEW COMPARISON:  04/19/2015 chest radiograph. FINDINGS: Sternotomy wires appear aligned and intact. CABG clips overlie the mediastinum. Stable cardiomediastinal silhouette with normal heart size. No pneumothorax. Small left pleural effusion. No right pleural effusion. No overt pulmonary edema. Mild bibasilar curvilinear opacities. IMPRESSION: 1. No overt pulmonary edema. 2. Small left pleural effusion. 3. Mild curvilinear bibasilar opacities, favor scarring or atelectasis. Electronically Signed   By: Ilona Sorrel [name]   On: 09/01/2016 19:33   US Renal  Result Date: 09/02/2016 CLINICAL DATA:  82 year old male with a history of hydronephrosis. EXAM: RENAL / URINARY TRACT ULTRASOUND COMPLETE COMPARISON:  CT 07/28/2016, CT 11/16/2014 FINDINGS: Right Kidney: Length: 9.7 cm. Echogenicity similar to that of the adjacent liver parenchyma. Cortical thinning, which was present upon comparison CT studies. Small hyperechoic focus at the inferior pole collecting system measures 9 mm, present on comparison CT. Very minimal enlargement of the  right renal pelvis. Flow confirmed in the hilum of the right kidney. Left Kidney: Length: 10.6 cm. No left-sided hydronephrosis. No left nephrolithiasis. Bladder: Urinary bladder not visualized. IMPRESSION: There is minimal enlargement of the right renal pelvis, which, when compared across modalities to the prior CT, appears improved. Unchanged position of known nonobstructive stone in the inferior collecting system of the right kidney. No left-sided hydronephrosis. Signed, Dulcy Fanny. Earleen Newport, DO [facility] Electronically Signed   By: Corrie Mckusick D.O.   On: 09/02/2016 17:18       Subjective: Had a large bowel movement yesterday and subsequently suprapubic pain  resolved. Has mild dysuria, improved from yesterday.  Discharge Exam: Vitals:   09/03/16 2147 09/04/16 0431  BP: 126/83 (!) 148/83  Pulse: 89 77  Resp: 19 19  Temp: 98.2 F (36.8 C) 98.7 F (37.1 C)   Vitals:   09/03/16 0925 09/03/16 1856 09/03/16 2147 09/04/16 0431  BP: 116/67 (!) 148/84 126/83 (!) 148/83  Pulse: 85 88 89 77  Resp: 19 20 19 19   Temp: 97.4 F (36.3 C) 98.2 F (36.8 C) 98.2 F (36.8 C) 98.7 F (37.1 C)  TempSrc: Oral Oral Oral Oral  SpO2: 96% 97% 99% 93%  Weight:   126.3 kg (278 lb 7.1 oz)   Height:  Gen: not in distress HEENT: , moist mucosa, supple neck Chest: clear b/l, no added sounds CVS: N S1&S2, no murmurs, rubs or gallop GI: soft,  ND, BS+, nontender Musculoskeletal: warm, no edema [id number] and oriented    The results of significant diagnostics from this hospitalization (including imaging, microbiology, ancillary and laboratory) are listed below for reference.     Microbiology: Recent Results (from the past 240 hour(s))  Urine culture     Status: Abnormal   Collection Time: 09/01/16  5:39 PM  Result Value Ref Range Status   Specimen Description URINE, RANDOM  Final   Special Requests NONE  Final   Culture MULTIPLE  SPECIES PRESENT, SUGGEST RECOLLECTION (A)  Final   Report Status 09/02/2016 FINAL  Final  Blood culture (routine x 2)     Status: None (Preliminary result)   Collection Time: 09/01/16  6:50 PM  Result Value Ref Range Status   Specimen Description BLOOD RIGHT ANTECUBITAL  Final   Special Requests BOTTLES DRAWN AEROBIC AND ANAEROBIC 8CC  Final   Culture NO GROWTH 2 DAYS  Final   Report Status PENDING  Incomplete  Blood culture (routine x 2)     Status: Abnormal   Collection Time: 09/01/16  7:50 PM  Result Value Ref Range Status   Specimen Description BLOOD LEFT ARM  Final   Special Requests BOTTLES DRAWN AEROBIC AND ANAEROBIC 5CC EA  Final   Culture  Setup Time   Final    GRAM POSITIVE COCCI IN CLUSTERS AEROBIC BOTTLE ONLY Organism ID to follow CRITICAL RESULT CALLED TO, READ BACK BY AND VERIFIED WITH: H.BAIRD,PHRMD 09/02/16 @2111  BY V.WILKINS    Culture (A)  Final    STAPHYLOCOCCUS SPECIES (COAGULASE NEGATIVE) THE SIGNIFICANCE OF ISOLATING THIS ORGANISM FROM A SINGLE SET OF BLOOD CULTURES WHEN MULTIPLE SETS ARE DRAWN IS UNCERTAIN. PLEASE NOTIFY THE MICROBIOLOGY DEPARTMENT WITHIN ONE WEEK IF SPECIATION AND SENSITIVITIES ARE REQUIRED.    Report Status 09/04/2016 FINAL  Final  Blood Culture ID Panel (Reflexed)     Status: Abnormal   Collection Time: 09/01/16  7:50 PM  Result Value Ref Range Status   Enterococcus species NOT DETECTED NOT DETECTED Final   Listeria monocytogenes NOT DETECTED NOT DETECTED Final   Staphylococcus species DETECTED (A) NOT DETECTED Final    Comment: CRITICAL RESULT CALLED TO, READ BACK BY AND VERIFIED WITH: H.BAIRD,PHRMD 09/02/16 @2111  BY V.WILKINS    Staphylococcus aureus NOT DETECTED NOT DETECTED Final   Methicillin resistance DETECTED (A) NOT DETECTED Final    Comment: CRITICAL RESULT CALLED TO, READ BACK BY AND VERIFIED WITH: H.BAIRD,PHRMD 09/02/16 @2111  BY V.WILKINS    Streptococcus species NOT DETECTED NOT DETECTED Final   Streptococcus agalactiae  NOT DETECTED NOT DETECTED Final   Streptococcus pneumoniae NOT DETECTED NOT DETECTED Final   Streptococcus pyogenes NOT DETECTED NOT DETECTED Final   Acinetobacter baumannii NOT DETECTED NOT DETECTED Final   Enterobacteriaceae species NOT DETECTED NOT DETECTED Final   Enterobacter cloacae complex NOT DETECTED NOT DETECTED Final   Escherichia coli NOT DETECTED NOT DETECTED Final   Klebsiella oxytoca NOT DETECTED NOT DETECTED Final   Klebsiella pneumoniae NOT DETECTED NOT DETECTED Final   Proteus species NOT DETECTED NOT DETECTED Final   Serratia marcescens NOT DETECTED NOT DETECTED Final   Haemophilus influenzae NOT DETECTED NOT DETECTED Final   Neisseria meningitidis NOT DETECTED NOT DETECTED Final   Pseudomonas aeruginosa NOT DETECTED NOT DETECTED Final   Candida albicans NOT DETECTED NOT DETECTED Final   Candida glabrata NOT  DETECTED NOT DETECTED Final   Candida krusei NOT DETECTED NOT DETECTED Final   Candida parapsilosis NOT DETECTED NOT DETECTED Final   Candida tropicalis NOT DETECTED NOT DETECTED Final     Labs: BNP (last 3 results) No results for input(s): BNP in the last 8760 hours. Basic Metabolic Panel:  Recent Labs Lab 09/01/16 1724 09/02/16 0425 09/03/16 0733  NA 138 138 137  K 4.7 4.3 4.6  CL 107 109 110  CO2 21* 23 22  GLUCOSE 103* 97 85  BUN 16 16 15   CREATININE 1.25* 1.31* 1.22  CALCIUM 9.7 8.9 8.9   Liver Function Tests:  Recent Labs Lab 09/01/16 1724  AST 23  ALT 18  ALKPHOS 53  BILITOT 0.6  PROT 7.9  ALBUMIN 3.1*   No results for input(s): LIPASE, AMYLASE in the last 168 hours. No results for input(s): AMMONIA in the last 168 hours. CBC:  Recent Labs Lab 09/01/16 1724 09/02/16 0425 09/03/16 0733  WBC 12.0* 9.7 8.6  NEUTROABS 7.8* 6.3 5.5  HGB 12.0* 9.9* 9.5*  HCT 37.0* 31.3* 30.1*  MCV 86.9 89.2 89.6  PLT 354 310 293   Cardiac Enzymes: No results for input(s): CKTOTAL, CKMB, CKMBINDEX, TROPONINI in the last 168  hours. BNP: Invalid input(s): POCBNP CBG: No results for input(s): GLUCAP in the last 168 hours. D-Dimer No results for input(s): DDIMER in the last 72 hours. Hgb A1c No results for input(s): HGBA1C in the last 72 hours. Lipid Profile No results for input(s): CHOL, HDL, LDLCALC, TRIG, CHOLHDL, LDLDIRECT in the last 72 hours. Thyroid function studies No results for input(s): TSH, T4TOTAL, T3FREE, THYROIDAB in the last 72 hours.  Invalid input(s): FREET3 Anemia work up No results for input(s): VITAMINB12, FOLATE, FERRITIN, TIBC, IRON, RETICCTPCT in the last 72 hours. Urinalysis    Component Value Date/Time   COLORURINE AMBER (A) 09/01/2016 1739   APPEARANCEUR TURBID (A) 09/01/2016 1739   LABSPEC 1.018 09/01/2016 1739   PHURINE 7.5 09/01/2016 1739   GLUCOSEU NEGATIVE 09/01/2016 1739   HGBUR LARGE (A) 09/01/2016 1739   BILIRUBINUR NEGATIVE 09/01/2016 1739   KETONESUR 15 (A) 09/01/2016 1739   PROTEINUR >300 (A) 09/01/2016 1739   UROBILINOGEN 0.2 09/16/2015 1741   NITRITE POSITIVE (A) 09/01/2016 1739   LEUKOCYTESUR LARGE (A) 09/01/2016 1739   Sepsis Labs Invalid input(s): PROCALCITONIN,  WBC,  LACTICIDVEN Microbiology Recent Results (from the past 240 hour(s))  Urine culture     Status: Abnormal   Collection Time: 09/01/16  5:39 PM  Result Value Ref Range Status   Specimen Description URINE, RANDOM  Final   Special Requests NONE  Final   Culture MULTIPLE SPECIES PRESENT, SUGGEST RECOLLECTION (A)  Final   Report Status 09/02/2016 FINAL  Final  Blood culture (routine x 2)     Status: None (Preliminary result)   Collection Time: 09/01/16  6:50 PM  Result Value Ref Range Status   Specimen Description BLOOD RIGHT ANTECUBITAL  Final   Special Requests BOTTLES DRAWN AEROBIC AND ANAEROBIC 8CC  Final   Culture NO GROWTH 2 DAYS  Final   Report Status PENDING  Incomplete  Blood culture (routine x 2)     Status: Abnormal   Collection Time: 09/01/16  7:50 PM  Result Value Ref Range  Status   Specimen Description BLOOD LEFT ARM  Final   Special Requests BOTTLES DRAWN AEROBIC AND ANAEROBIC 5CC EA  Final   Culture  Setup Time   Final    GRAM POSITIVE COCCI IN CLUSTERS  AEROBIC BOTTLE ONLY Organism ID to follow CRITICAL RESULT CALLED TO, READ BACK BY AND VERIFIED WITH: H.BAIRD,PHRMD 09/02/16 @2111  BY V.WILKINS    Culture (A)  Final    STAPHYLOCOCCUS SPECIES (COAGULASE NEGATIVE) THE SIGNIFICANCE OF ISOLATING THIS ORGANISM FROM A SINGLE SET OF BLOOD CULTURES WHEN MULTIPLE SETS ARE DRAWN IS UNCERTAIN. PLEASE NOTIFY THE MICROBIOLOGY DEPARTMENT WITHIN ONE WEEK IF SPECIATION AND SENSITIVITIES ARE REQUIRED.    Report Status 09/04/2016 FINAL  Final  Blood Culture ID Panel (Reflexed)     Status: Abnormal   Collection Time: 09/01/16  7:50 PM  Result Value Ref Range Status   Enterococcus species NOT DETECTED NOT DETECTED Final   Listeria monocytogenes NOT DETECTED NOT DETECTED Final   Staphylococcus species DETECTED (A) NOT DETECTED Final    Comment: CRITICAL RESULT CALLED TO, READ BACK BY AND VERIFIED WITH: H.BAIRD,PHRMD 09/02/16 @2111  BY V.WILKINS    Staphylococcus aureus NOT DETECTED NOT DETECTED Final   Methicillin resistance DETECTED (A) NOT DETECTED Final    Comment: CRITICAL RESULT CALLED TO, READ BACK BY AND VERIFIED WITH: H.BAIRD,PHRMD 09/02/16 @2111  BY V.WILKINS    Streptococcus species NOT DETECTED NOT DETECTED Final   Streptococcus agalactiae NOT DETECTED NOT DETECTED Final   Streptococcus pneumoniae NOT DETECTED NOT DETECTED Final   Streptococcus pyogenes NOT DETECTED NOT DETECTED Final   Acinetobacter baumannii NOT DETECTED NOT DETECTED Final   Enterobacteriaceae species NOT DETECTED NOT DETECTED Final   Enterobacter cloacae complex NOT DETECTED NOT DETECTED Final   Escherichia coli NOT DETECTED NOT DETECTED Final   Klebsiella oxytoca NOT DETECTED NOT DETECTED Final   Klebsiella pneumoniae NOT DETECTED NOT DETECTED Final   Proteus species NOT DETECTED NOT  DETECTED Final   Serratia marcescens NOT DETECTED NOT DETECTED Final   Haemophilus influenzae NOT DETECTED NOT DETECTED Final   Neisseria meningitidis NOT DETECTED NOT DETECTED Final   Pseudomonas aeruginosa NOT DETECTED NOT DETECTED Final   Candida albicans NOT DETECTED NOT DETECTED Final   Candida glabrata NOT DETECTED NOT DETECTED Final   Candida krusei NOT DETECTED NOT DETECTED Final   Candida parapsilosis NOT DETECTED NOT DETECTED Final   Candida tropicalis NOT DETECTED NOT DETECTED Final     Time coordinating discharge: Over 30 minutes  SIGNED:   Louellen Molder, MD  [facility] 09/04/2016, 10:18 AM Pager   If 7PM-7AM, please contact night-coverage [email or web address] Password [email or web address]

## 2016-09-06 LAB — CULTURE, BLOOD (ROUTINE X 2): CULTURE: NO GROWTH

## 2016-09-12 ENCOUNTER — Telehealth: Payer: Self-pay | Admitting: Physical Medicine & Rehabilitation

## 2016-09-12 NOTE — Telephone Encounter (Signed)
approved

## 2016-09-12 NOTE — Telephone Encounter (Signed)
Alonna Minium ST with [facility] needs to get verbal orders to continue patient 1w2.  Please call her at [phone].

## 2016-09-19 ENCOUNTER — Encounter: Payer: Self-pay | Admitting: Physician Assistant

## 2016-09-19 ENCOUNTER — Telehealth: Payer: Self-pay | Admitting: Physical Medicine & Rehabilitation

## 2016-09-19 DIAGNOSIS — R1310 Dysphagia, unspecified: Secondary | ICD-10-CM

## 2016-09-19 DIAGNOSIS — R1319 Other dysphagia: Secondary | ICD-10-CM

## 2016-09-19 NOTE — Telephone Encounter (Signed)
Please advise on orders

## 2016-09-19 NOTE — Telephone Encounter (Signed)
Contacted [name] for details regarding swallow study and GI referral. She said [name] would be fine for barium swallow study and any GI office would be fine. We decided on [facility].  Orders have been placed

## 2016-09-19 NOTE — Telephone Encounter (Signed)
Please refer pt for Modified Barium Swallow as well as a GI referral.  Thanks.

## 2016-09-19 NOTE — Telephone Encounter (Signed)
Alonna Minium ST with [facility] needs to get a Modified Barium Swallow referral for patient.  Patient is unable to swallow and having issues eating.  She is having a hard time getting anything from primary care doctor.  If the referral is put in Epic she will call and get it set up for patient, just give her a call once this is done.  Also, she is needing a GI referral also.  Any questions please call her at [phone].

## 2016-09-20 ENCOUNTER — Other Ambulatory Visit (HOSPITAL_COMMUNITY): Payer: Self-pay | Admitting: Physical Medicine & Rehabilitation

## 2016-09-20 DIAGNOSIS — R1319 Other dysphagia: Secondary | ICD-10-CM

## 2016-09-26 ENCOUNTER — Telehealth: Payer: Self-pay | Admitting: Physical Medicine & Rehabilitation

## 2016-09-26 ENCOUNTER — Ambulatory Visit (HOSPITAL_COMMUNITY)
Admission: RE | Admit: 2016-09-26 | Discharge: 2016-09-26 | Disposition: A | Discharge status: Home / Self Care | Payer: Medicare Other | Source: Ambulatory Visit | Attending: Physical Medicine & Rehabilitation | Admitting: Physical Medicine & Rehabilitation

## 2016-09-26 DIAGNOSIS — R1319 Other dysphagia: Secondary | ICD-10-CM | POA: Insufficient documentation

## 2016-09-26 DIAGNOSIS — R1310 Dysphagia, unspecified: Secondary | ICD-10-CM

## 2016-09-26 NOTE — Telephone Encounter (Signed)
Verbal order given  

## 2016-09-26 NOTE — Telephone Encounter (Signed)
Alonna Minium ST with [facility] needs to get verbal order to see patient 1 more time next week.

## 2016-09-27 ENCOUNTER — Ambulatory Visit (INDEPENDENT_AMBULATORY_CARE_PROVIDER_SITE_OTHER): Payer: Medicare Other | Admitting: Physician Assistant

## 2016-09-27 ENCOUNTER — Encounter: Payer: Self-pay | Admitting: Physician Assistant

## 2016-09-27 ENCOUNTER — Encounter (INDEPENDENT_AMBULATORY_CARE_PROVIDER_SITE_OTHER): Payer: Self-pay

## 2016-09-27 VITALS — BP 90/58 | HR 88

## 2016-09-27 DIAGNOSIS — R634 Abnormal weight loss: Secondary | ICD-10-CM | POA: Diagnosis not present

## 2016-09-27 DIAGNOSIS — R1310 Dysphagia, unspecified: Secondary | ICD-10-CM | POA: Diagnosis not present

## 2016-09-27 DIAGNOSIS — R933 Abnormal findings on diagnostic imaging of other parts of digestive tract: Secondary | ICD-10-CM

## 2016-09-27 NOTE — Progress Notes (Signed)
Complicated case. Agree with esophagram as the next step. Anderson Malta to follow-up on that report

## 2016-09-27 NOTE — Patient Instructions (Signed)
If you are age 80 or older, your body mass index should be between 23-30. Your There is no height or weight on file to calculate BMI. If this is out of the aforementioned range listed, please consider follow up with your Primary Care Provider.  If you are age 38 or younger, your body mass index should be between 19-25. Your There is no height or weight on file to calculate BMI. If this is out of the aformentioned range listed, please consider follow up with your Primary Care Provider.   You have been scheduled for a Barium Esophogram at [facility] on Wednesday at 10:30am. Please arrive 15 minutes prior to your appointment for registration. Make certain not to have anything to eat or drink 3 hours prior to your test. If you need to reschedule for any reason, please contact radiology at [phone] to do so. __________________________________________________________________ A barium swallow is an examination that concentrates on views of the esophagus. This tends to be a double contrast exam (barium and two liquids which, when combined, create a gas to distend the wall of the oesophagus) or single contrast (non-ionic iodine based). The study is usually tailored to your symptoms so a good history is essential. Attention is paid during the study to the form, structure and configuration of the esophagus, looking for functional disorders (such as aspiration, dysphagia, achalasia, motility and reflux) EXAMINATION You may be asked to change into a gown, depending on the type of swallow being performed. A radiologist and radiographer will perform the procedure. The radiologist will advise you of the type of contrast selected for your procedure and direct you during the exam. You will be asked to stand, sit or lie in several different positions and to hold a small amount of fluid in your mouth before being asked to swallow while the imaging is performed .In some instances you may be asked to swallow barium  coated marshmallows to assess the motility of a solid food bolus. The exam can be recorded as a digital or video fluoroscopy procedure. POST PROCEDURE It will take 1-2 days for the barium to pass through your system. To facilitate this, it is important, unless otherwise directed, to increase your fluids for the next 24-48hrs and to resume your normal diet.  This test typically takes about 30 minutes to perform. __________________________________________________________________________________

## 2016-09-27 NOTE — Progress Notes (Signed)
Chief Complaint: Dysphagia and weight loss  HPI:  [name] is an 82 year old Caucasian male, with past medical history significant for bladder cancer, CAD (07/13/04 Echo shows ejection fraction of 55-65%), hyperlipidemia, hypertension and prostate cancer,  who was referred to me by [name], MD for a complaint of dysphagia and weight loss.    Per chart review patient had a barium swallow study with speech pathology yesterday, 09/26/16. Barium swallow showed oral dysphagia as well as esophageal stasis which could be from dysmotility or distal stricture. After he saw speech pathologist he had a diagnosis of oral phase dysphagia suspected primary esophageal dysphagia. There was delayed oral transiting noted with lingual tremor intermittently. Premature spillage due to weakness and decreased bolus cohesion results and oral residuals covering posterior lingual musculature. GI evaluation was recommended for further evaluation of esophagus.  Today, the patient presents to clinic accompanied by his wife and tells me that he has been through a lot lately. He has recently been diagnosed with prostate cancer and possible bladder cancer and was hospitalized in September for a UTI which led to sepsis. The patient's wife tells me that he has follow with oncology tomorrow and is following with the urologist at [facility] regarding his bladder cancer. In regards to the patient's GI symptoms, he has been having dysphagia for approximately 3 months. Due to this he has had a decreased diet and a weight loss of around 30 pounds per the patient's wife. The patient tells me that this happens with liquids and solids, things seem to feel like they have trouble going down and then get stuck on the way. Sometimes he does have regurgitation of liquids and solids. Currently he is on a soft diet and is tolerating this somewhat.  Patient denies fever, chills, blood in his stool, melena, change in bowel habits, heartburn,  reflux, dizziness or syncope.   Past Medical History:  Diagnosis Date  . Bladder cancer ([place]) 05/2015  . CAD (coronary artery disease)   . Hyperlipidemia   . Hypertension   . Kidney stones   . Prostate CA [facility])     Past Surgical History:  Procedure Laterality Date  . CARDIAC SURGERY    . CORONARY ARTERY BYPASS GRAFT    . PENILE PROSTHESIS IMPLANT    . PROSTATE SURGERY      Current Outpatient Prescriptions  Medication Sig Dispense Refill  . acetaminophen (TYLENOL) 500 MG tablet Take 500-1,000 mg by mouth every 6 (six) hours as needed for mild pain.     . Multiple Vitamin (MULTIVITAMIN) capsule Take 1 capsule by mouth daily.     . nitroGLYCERIN (NITRODUR - DOSED IN MG/24 HR) 0.4 mg/hr patch Place 0.4 mg onto the skin every morning.   3  . rosuvastatin (CRESTOR) 10 MG tablet Take 10 mg by mouth every evening.     . traMADol (ULTRAM) 50 MG tablet Take 1 tablet (50 mg total) by mouth every 6 (six) hours as needed for moderate pain. 20 tablet 0  . vitamin C (ASCORBIC ACID) 500 MG tablet Take 500 mg by mouth.      No current facility-administered medications for this visit.     Allergies as of 09/27/2016 - Review Complete 09/01/2016  Allergen Reaction Noted  . Gabapentin Other (See Comments) 09/01/2016  . Other Other (See Comments) 09/16/2015    Family History  Problem Relation Age of Onset  . Congestive Heart Failure Mother   . Kidney failure Sister   . Hypertension Sister   .  Diabetes Mellitus II Brother   . CAD Brother     Social History   Social History  . Marital status: Married    Spouse name: N/A  . Number of children: N/A  . Years of education: N/A   Occupational History  . Not on file.   Social History Main Topics  . Smoking status: Never Smoker  . Smokeless tobacco: Never Used  . Alcohol use No  . Drug use:     Types: Marijuana  . Sexual activity: Not on file   Other Topics Concern  . Not on file   Social History Narrative  . No narrative on  file    Review of Systems:     Constitutional: Positive for weight loss of 30 pounds in 3 months Denies fever, chills, weakness or fatigue HEENT: Eyes: No change in vision               Ears, Nose, Throat:  No change in hearing or congestion Skin: No rash or itching Cardiovascular: No chest pain, chest pressure or palpitations   Respiratory: No SOB or cough Gastrointestinal: See HPI and otherwise negative Genitourinary: No dysuria or change in urinary frequency Neurological: No headache, dizziness or syncope Musculoskeletal: No new muscle or joint pain Hematologic: No bleeding or bruising Psychiatric: No history of depression or anxiety   Physical Exam:  Vital signs: BP (!) 90/58   Pulse 88   General:   Pleasant obese Caucasian male appears to be in NAD, Well developed, Well nourished, alert and cooperative Head:  Normocephalic and atraumatic. Eyes:   PEERL, EOMI. No icterus. Conjunctiva pink. Ears:  Normal auditory acuity. Neck:  Supple Throat: Oral cavity and pharynx without inflammation, swelling or lesion.  Lungs: Respirations even and unlabored. Lungs clear to auscultation bilaterally.   No wheezes, crackles, or rhonchi.  Heart: Normal S1, S2. No MRG. Regular rate and rhythm. No peripheral edema, cyanosis or pallor.  Abdomen:  Soft, nondistended, nontender. No rebound or guarding. Normal bowel sounds. No appreciable masses or hepatomegaly. Limited exam due to patient being wheelchair Rectal:  Not performed.  Msk:  Symmetrical without gross deformities. In wheelchair Extremities:  Without edema, no deformity or joint abnormality.  Neurologic:  Alert and  oriented x4;  grossly normal neurologically.  Skin:   Dry and intact without significant lesions or rashes. Psychiatric: Oriented to person, place and time. Demonstrates good judgement and reason without abnormal affect or behaviors.  RELEVANT LABS: CBC    Component Value Date/Time   WBC 8.6 09/03/2016 0733   RBC 3.36  (L) 09/03/2016 0733   HGB 9.5 (L) 09/03/2016 0733   HCT 30.1 (L) 09/03/2016 0733   PLT 293 09/03/2016 0733   MCV 89.6 09/03/2016 0733   MCH 28.3 09/03/2016 0733   MCHC 31.6 09/03/2016 0733   RDW 15.3 09/03/2016 0733   LYMPHSABS 1.9 09/03/2016 0733   MONOABS 0.7 09/03/2016 0733   EOSABS 0.4 09/03/2016 0733   BASOSABS 0.0 09/03/2016 0733    CMP     Component Value Date/Time   NA 137 09/03/2016 0733   K 4.6 09/03/2016 0733   CL 110 09/03/2016 0733   CO2 22 09/03/2016 0733   GLUCOSE 85 09/03/2016 0733   BUN 15 09/03/2016 0733   CREATININE 1.22 09/03/2016 0733   CALCIUM 8.9 09/03/2016 0733   PROT 7.9 09/01/2016 1724   ALBUMIN 3.1 (L) 09/01/2016 1724   AST 23 09/01/2016 1724   ALT 18 09/01/2016 1724   ALKPHOS 53 09/01/2016  1724   BILITOT 0.6 09/01/2016 1724   GFRNONAA 53 (L) 09/03/2016 0733   GFRAA >60 09/03/2016 0733   RECENT IMAGING: 09/26/16 Modified barium swallow study:  TECHNIQUE: Different consistencies of barium were administered orally to the patient by the Speech Pathologist. Imaging of the pharynx was performed in the lateral projection.  FLUOROSCOPY TIME:  Fluoroscopy Time:  2.1 minutes  Radiation Exposure Index (if provided by the fluoroscopic device): 8.1 mGyair kerma  Number of Acquired Spot Images: 0  COMPARISON:  None.  FINDINGS: Swallowing was observed with consistencies ranging from thin to solid. There is persistent delay in AP transit with intermittent tremor of the tongue. Once in the pharynx there is good epiglottic turnover and airway protection. Occasional mild stasis that would clear with second swallow. No visualized diverticulum.  Pill was provided but the patient could not deliver it to the pharynx using either pudding or liquid.  After completion of the exam single lateral image of the esophagus was obtained showing diffuse stasis.  IMPRESSION: 1. Oral dysphagia, please refer to the Speech Pathologists report for  complete details and recommendations. 2. Esophageal stasis which could be from dysmotility or distal stricture. Esophagram could further evaluate if appropriate in this patient with bladder malignancy.   Electronically Signed   By: Monte Fantasia [name]   On: 09/26/2016 14:58  Assessment: 1. Dysphagia: For the past 3 months, patient has had decreased appetite due to difficulty eating and a weight loss of 30 pounds, recent swallow study with speech pathology notes oral phase dysphagia as well as esophageal dysphagia, recommendations were for esophagram for further evaluation 2. Weight loss: See above 3. Abnormal Barium Swallow: as above questioning esophageal stricture vs dysmotility  Plan: 1. Due to patient's multiple comorbidities, will evaluate first with an esophagram before possible EGD. Discussed this with the patient and his wife. Pending esophagram results, could recommend EGD. Discussed risks, benefits, limitations and alternatives to this procedure and the patient and his wife agree to proceed if necessary. If needed this would be scheduled with Dr. Henrene Pastor as he is supervising physician this morning. 2. Recommend the patient continue to follow with speech pathology as discussed at the time of their appointment together yesterday. 3. Patient to continue on soft diet. 4. We will contact patient in regards to results from upcoming imaging study and direct him as to when he should follow with our clinic.  Ellouise Newer, PA-C [facility] 09/27/2016, 11:27 AM  Cc: [name], MD

## 2016-10-01 ENCOUNTER — Encounter (HOSPITAL_COMMUNITY): Payer: Self-pay | Admitting: Emergency Medicine

## 2016-10-01 ENCOUNTER — Inpatient Hospital Stay (HOSPITAL_COMMUNITY)
Admission: EM | Admit: 2016-10-01 | Discharge: 2016-10-03 | DRG: 871 | Disposition: A | Discharge status: Home / Self Care | Payer: Medicare Other | Attending: Family Medicine | Admitting: Family Medicine

## 2016-10-01 ENCOUNTER — Emergency Department (HOSPITAL_COMMUNITY): Payer: Medicare Other

## 2016-10-01 DIAGNOSIS — Z833 Family history of diabetes mellitus: Secondary | ICD-10-CM | POA: Diagnosis not present

## 2016-10-01 DIAGNOSIS — N179 Acute kidney failure, unspecified: Secondary | ICD-10-CM | POA: Diagnosis not present

## 2016-10-01 DIAGNOSIS — D649 Anemia, unspecified: Secondary | ICD-10-CM | POA: Diagnosis present

## 2016-10-01 DIAGNOSIS — Z87442 Personal history of urinary calculi: Secondary | ICD-10-CM

## 2016-10-01 DIAGNOSIS — Z951 Presence of aortocoronary bypass graft: Secondary | ICD-10-CM | POA: Diagnosis not present

## 2016-10-01 DIAGNOSIS — I4510 Unspecified right bundle-branch block: Secondary | ICD-10-CM | POA: Diagnosis present

## 2016-10-01 DIAGNOSIS — A419 Sepsis, unspecified organism: Principal | ICD-10-CM | POA: Diagnosis present

## 2016-10-01 DIAGNOSIS — E872 Acidosis, unspecified: Secondary | ICD-10-CM

## 2016-10-01 DIAGNOSIS — Z87891 Personal history of nicotine dependence: Secondary | ICD-10-CM | POA: Diagnosis not present

## 2016-10-01 DIAGNOSIS — R1310 Dysphagia, unspecified: Secondary | ICD-10-CM

## 2016-10-01 DIAGNOSIS — Z888 Allergy status to other drugs, medicaments and biological substances status: Secondary | ICD-10-CM

## 2016-10-01 DIAGNOSIS — Z841 Family history of disorders of kidney and ureter: Secondary | ICD-10-CM | POA: Diagnosis not present

## 2016-10-01 DIAGNOSIS — Z801 Family history of malignant neoplasm of trachea, bronchus and lung: Secondary | ICD-10-CM

## 2016-10-01 DIAGNOSIS — Z8546 Personal history of malignant neoplasm of prostate: Secondary | ICD-10-CM | POA: Diagnosis not present

## 2016-10-01 DIAGNOSIS — Z8249 Family history of ischemic heart disease and other diseases of the circulatory system: Secondary | ICD-10-CM

## 2016-10-01 DIAGNOSIS — D7589 Other specified diseases of blood and blood-forming organs: Secondary | ICD-10-CM | POA: Diagnosis present

## 2016-10-01 DIAGNOSIS — Z8551 Personal history of malignant neoplasm of bladder: Secondary | ICD-10-CM | POA: Diagnosis not present

## 2016-10-01 DIAGNOSIS — Z79899 Other long term (current) drug therapy: Secondary | ICD-10-CM | POA: Diagnosis not present

## 2016-10-01 DIAGNOSIS — R6520 Severe sepsis without septic shock: Secondary | ICD-10-CM | POA: Diagnosis present

## 2016-10-01 DIAGNOSIS — I110 Hypertensive heart disease with heart failure: Secondary | ICD-10-CM | POA: Diagnosis present

## 2016-10-01 DIAGNOSIS — Z87440 Personal history of urinary (tract) infections: Secondary | ICD-10-CM

## 2016-10-01 DIAGNOSIS — E860 Dehydration: Secondary | ICD-10-CM | POA: Diagnosis present

## 2016-10-01 DIAGNOSIS — I5032 Chronic diastolic (congestive) heart failure: Secondary | ICD-10-CM | POA: Diagnosis present

## 2016-10-01 DIAGNOSIS — Z9889 Other specified postprocedural states: Secondary | ICD-10-CM | POA: Diagnosis not present

## 2016-10-01 DIAGNOSIS — R531 Weakness: Secondary | ICD-10-CM

## 2016-10-01 DIAGNOSIS — C679 Malignant neoplasm of bladder, unspecified: Secondary | ICD-10-CM | POA: Diagnosis not present

## 2016-10-01 DIAGNOSIS — N390 Urinary tract infection, site not specified: Secondary | ICD-10-CM | POA: Diagnosis present

## 2016-10-01 DIAGNOSIS — I25810 Atherosclerosis of coronary artery bypass graft(s) without angina pectoris: Secondary | ICD-10-CM | POA: Diagnosis present

## 2016-10-01 DIAGNOSIS — Z960 Presence of urogenital implants: Secondary | ICD-10-CM | POA: Diagnosis present

## 2016-10-01 DIAGNOSIS — N170 Acute kidney failure with tubular necrosis: Secondary | ICD-10-CM | POA: Diagnosis present

## 2016-10-01 LAB — I-STAT CG4 LACTIC ACID, ED: LACTIC ACID, VENOUS: 2.86 mmol/L — AB (ref 0.5–1.9)

## 2016-10-01 LAB — CBC WITH DIFFERENTIAL/PLATELET
BASOS ABS: 0 10*3/uL (ref 0.0–0.1)
BASOS PCT: 0 %
Eosinophils Absolute: 0.1 10*3/uL (ref 0.0–0.7)
Eosinophils Relative: 0 %
HEMATOCRIT: 31.3 % — AB (ref 39.0–52.0)
HEMOGLOBIN: 10.4 g/dL — AB (ref 13.0–17.0)
LYMPHS PCT: 12 %
Lymphs Abs: 2.3 10*3/uL (ref 0.7–4.0)
MCH: 27.8 pg (ref 26.0–34.0)
MCHC: 33.2 g/dL (ref 30.0–36.0)
MCV: 83.7 fL (ref 78.0–100.0)
Monocytes Absolute: 2.3 10*3/uL — ABNORMAL HIGH (ref 0.1–1.0)
Monocytes Relative: 11 %
NEUTROS ABS: 15.3 10*3/uL — AB (ref 1.7–7.7)
NEUTROS PCT: 77 %
Platelets: 423 10*3/uL — ABNORMAL HIGH (ref 150–400)
RBC: 3.74 MIL/uL — AB (ref 4.22–5.81)
RDW: 15.6 % — AB (ref 11.5–15.5)
WBC: 20 10*3/uL — AB (ref 4.0–10.5)

## 2016-10-01 LAB — URINALYSIS, ROUTINE W REFLEX MICROSCOPIC
GLUCOSE, UA: NEGATIVE mg/dL
KETONES UR: NEGATIVE mg/dL
NITRITE: NEGATIVE
PH: 6.5 (ref 5.0–8.0)
Protein, ur: >300 mg/dL — AB
SPECIFIC GRAVITY, URINE: 1.022 (ref 1.005–1.030)

## 2016-10-01 LAB — COMPREHENSIVE METABOLIC PANEL
ALBUMIN: 2.7 g/dL — AB (ref 3.5–5.0)
ALK PHOS: 62 U/L (ref 38–126)
ALT: 35 U/L (ref 17–63)
ANION GAP: 8 (ref 5–15)
AST: 55 U/L — ABNORMAL HIGH (ref 15–41)
BILIRUBIN TOTAL: 0.4 mg/dL (ref 0.3–1.2)
BUN: 36 mg/dL — ABNORMAL HIGH (ref 6–20)
CALCIUM: 9.8 mg/dL (ref 8.9–10.3)
CO2: 21 mmol/L — ABNORMAL LOW (ref 22–32)
Chloride: 110 mmol/L (ref 101–111)
Creatinine, Ser: 2.43 mg/dL — ABNORMAL HIGH (ref 0.61–1.24)
GFR calc non Af Amer: 23 mL/min — ABNORMAL LOW (ref >60)
GFR, EST AFRICAN AMERICAN: 27 mL/min — AB (ref >60)
GLUCOSE: 125 mg/dL — AB (ref 65–99)
POTASSIUM: 4.6 mmol/L (ref 3.5–5.1)
SODIUM: 139 mmol/L (ref 135–145)
TOTAL PROTEIN: 8.1 g/dL (ref 6.5–8.1)

## 2016-10-01 LAB — LACTIC ACID, PLASMA
Lactic Acid, Venous: 1.1 mmol/L (ref 0.5–1.9)
Lactic Acid, Venous: 2 mmol/L (ref 0.5–1.9)

## 2016-10-01 LAB — PROCALCITONIN: Procalcitonin: 0.36 ng/mL

## 2016-10-01 LAB — APTT: aPTT: 31 seconds (ref 24–36)

## 2016-10-01 LAB — URINE MICROSCOPIC-ADD ON: SQUAMOUS EPITHELIAL / LPF: NONE SEEN

## 2016-10-01 LAB — PROTIME-INR
INR: 1.48
PROTHROMBIN TIME: 18.1 s — AB (ref 11.4–15.2)

## 2016-10-01 LAB — TROPONIN I
TROPONIN I: 0.04 ng/mL — AB (ref <0.03)
Troponin I: 0.04 ng/mL (ref <0.03)

## 2016-10-01 MED ORDER — ONDANSETRON HCL 4 MG/2ML IJ SOLN: 4.0000 mg | Freq: Four times a day (QID) | INTRAMUSCULAR | Status: DC | PRN

## 2016-10-01 MED ORDER — SODIUM CHLORIDE 0.9 % IV SOLN
INTRAVENOUS | Status: AC
  Administered 2016-10-01 – 2016-10-02 (×2): via INTRAVENOUS

## 2016-10-01 MED ORDER — ONDANSETRON HCL 4 MG PO TABS: 4.0000 mg | ORAL_TABLET | Freq: Four times a day (QID) | ORAL | Status: DC | PRN

## 2016-10-01 MED ORDER — SODIUM CHLORIDE 0.9 % IV SOLN
2000.0000 mg | Freq: Once | INTRAVENOUS | Status: AC
  Administered 2016-10-01: 2000 mg via INTRAVENOUS
  Filled 2016-10-01: qty 2000

## 2016-10-01 MED ORDER — CEFEPIME HCL 2 G IJ SOLR
2.0000 g | INTRAMUSCULAR | Status: DC
  Administered 2016-10-01 – 2016-10-02 (×2): 2 g via INTRAVENOUS
  Filled 2016-10-01 (×3): qty 2

## 2016-10-01 MED ORDER — ROSUVASTATIN CALCIUM 10 MG PO TABS
10.0000 mg | ORAL_TABLET | Freq: Every day | ORAL | Status: DC
  Administered 2016-10-02 – 2016-10-03 (×2): 10 mg via ORAL
  Filled 2016-10-01 (×2): qty 1

## 2016-10-01 MED ORDER — PIPERACILLIN-TAZOBACTAM 3.375 G IVPB 30 MIN
3.3750 g | Freq: Once | INTRAVENOUS | Status: AC
  Administered 2016-10-01: 3.375 g via INTRAVENOUS
  Filled 2016-10-01: qty 50

## 2016-10-01 MED ORDER — SODIUM CHLORIDE 0.9 % IV BOLUS (SEPSIS)
1000.0000 mL | Freq: Once | INTRAVENOUS | Status: AC
  Administered 2016-10-01: 1000 mL via INTRAVENOUS

## 2016-10-01 MED ORDER — TRAMADOL HCL 50 MG PO TABS
50.0000 mg | ORAL_TABLET | Freq: Four times a day (QID) | ORAL | Status: DC | PRN
  Administered 2016-10-01 (×2): 50 mg via ORAL
  Filled 2016-10-01 (×2): qty 1

## 2016-10-01 MED ORDER — HEPARIN SODIUM (PORCINE) 5000 UNIT/ML IJ SOLN
5000.0000 [IU] | Freq: Three times a day (TID) | INTRAMUSCULAR | Status: DC
  Administered 2016-10-01 – 2016-10-03 (×5): 5000 [IU] via SUBCUTANEOUS
  Filled 2016-10-01 (×5): qty 1

## 2016-10-01 MED ORDER — ACETAMINOPHEN 650 MG RE SUPP: 650.0000 mg | Freq: Four times a day (QID) | RECTAL | Status: DC | PRN

## 2016-10-01 MED ORDER — ACETAMINOPHEN 325 MG PO TABS
650.0000 mg | ORAL_TABLET | Freq: Four times a day (QID) | ORAL | Status: DC | PRN
  Filled 2016-10-01: qty 2

## 2016-10-01 MED ORDER — SODIUM CHLORIDE 0.9% FLUSH
3.0000 mL | Freq: Two times a day (BID) | INTRAVENOUS | Status: DC
  Administered 2016-10-01 – 2016-10-03 (×4): 3 mL via INTRAVENOUS

## 2016-10-01 MED ORDER — HYDROCODONE-ACETAMINOPHEN 5-325 MG PO TABS: 1.0000 | ORAL_TABLET | ORAL | Status: DC | PRN

## 2016-10-01 MED ORDER — DEXTROSE 5 % IV SOLN: 2.0000 g | Freq: Once | INTRAVENOUS | Status: DC

## 2016-10-01 NOTE — ED Notes (Signed)
IV attempt by RN

## 2016-10-01 NOTE — Progress Notes (Signed)
EDCM spoke to patient's wife at bedside.  Patient sleeping.  Patient's wife confirms that patient still has services with [facility] for RN, PT, aide and social worker.  She reports patient has a walker, bedside commode (doesn't use it), and a shower bench at home.  She reports patient's pcp Dr. Arletha Grippe of the [facility] in [place] is patient's pcp.  Patient's wife cannot think of anything else the patient may need at home.  No further EDCM needs at this time.

## 2016-10-01 NOTE — Progress Notes (Signed)
CRITICAL VALUE ALERT  Critical value received:  Lactic Acid 2.0  Date of notification:  10/01/2016  Time of notification:  2313  Critical value read back:Yes.    Nurse who received alert:  Raynelle Jan  MD notified (1st page):  Dr. Myna Hidalgo  Time of first page:  2317  MD notified (2nd page):  Time of second page:  Responding MD:  Dr. Myna Hidalgo  Time MD responded:  2320

## 2016-10-01 NOTE — Progress Notes (Signed)
Pharmacy Antibiotic Note  [name] is a 82 y.o. male with PMH of Pseudomonas UTI admitted on 10/01/2016 with sepsis secondary to UTI. Patient also noted to have AKI with SCr of 2.43. Pharmacy has been consulted for Cefepime dosing. Patient given one dose of Vancomycin and Zosyn in the ED.  Plan: Cefepime 2g IV q24h. Monitor renal function, cultures, clinical course.  Height: 6\' 3"  (190.5 cm) Weight: 269 lb (122 kg) IBW/kg (Calculated) : 84.5  Temp (24hrs), Avg:99.1 F (37.3 C), Min:99.1 F (37.3 C), Max:99.1 F (37.3 C)   Recent Labs Lab 10/01/16 1618 10/01/16 1632  WBC 20.0*  --   CREATININE 2.43*  --   LATICACIDVEN  --  2.86*    Estimated Creatinine Clearance: 32.4 mL/min (by C-G formula based on SCr of 2.43 mg/dL (H)).    Allergies  Allergen Reactions  . Gabapentin Other (See Comments)    Shaking  . Other Other (See Comments)    Antihistamines-unknown reaction    Antimicrobials this admission: 10/23 >> Vancomycin x 1 10/23 >> Zosyn x 1 10/23 >> Cefepime >>  Dose adjustments this admission: ---  Microbiology results: 10/23 BCx: sent 10/23 UCx: sent   Thank you for allowing pharmacy to be a part of this patient's care.   Lindell Spar, PharmD, BCPS Pager: [phone] 10/01/2016 7:55 PM

## 2016-10-01 NOTE — ED Notes (Signed)
Pts wife is Danton Clap and can be reached at [phone]

## 2016-10-01 NOTE — ED Provider Notes (Signed)
[facility] DEPT Provider Note   CSN: [id number] Arrival date & time: 10/01/16  1508     History   Chief Complaint Chief Complaint  Patient presents with  . Weakness  . Fever    HPI [name] is a 82 y.o. male.   Weakness  Primary symptoms include dizziness.  Primary symptoms include no focal weakness, no movement disorder. This is a new problem. The current episode started less than 1 hour ago. The problem has not changed since onset.There was no focality noted. There has been no fever. Pertinent negatives include no shortness of breath, no chest pain, no vomiting and no altered mental status.    Past Medical History:  Diagnosis Date  . Bladder cancer ([place]) 05/2015  . CAD (coronary artery disease)   . Hyperlipidemia   . Hypertension   . Kidney stones   . Prostate CA [facility])     Patient Active Problem List   Diagnosis Date Noted  . Sepsis due to urinary tract infection ([place]) 10/01/2016  . Generalized weakness   . Acute kidney injury ([place])   . UTI (lower urinary tract infection) 09/01/2016  . Bladder cancer ([place]) 09/01/2016  . Neuropathic pain   . Contact dermatitis   . AKI (acute kidney injury) ([place])   . Recurrent UTI   . Leukocytosis   . Absence of bladder continence   . Pain   . Hydrocele in adult   . Acute cystitis with hematuria   . Acute blood loss anemia   . Anemia of chronic disease   . Other secondary hypertension   . Acute idiopathic gout of left knee   . Urinary retention   . Debilitated 08/02/2016  . Bilateral hydronephrosis   . Normochromic normocytic anemia   . E-coli UTI   . Benign essential HTN   . Coronary artery disease involving coronary bypass graft of native heart without angina pectoris   . BPH (benign prostatic hyperplasia)   . Kidney stones   . CKD (chronic kidney disease)   . Morbid obesity due to excess calories ([place])   . Acute renal failure (ARF) ([place]) 07/29/2016    Past Surgical History:  Procedure Laterality  Date  . CARDIAC SURGERY    . CORONARY ARTERY BYPASS GRAFT    . PENILE PROSTHESIS IMPLANT    . PROSTATE SURGERY         Home Medications    Prior to Admission medications   Medication Sig Start Date End Date Taking? Authorizing Provider  acetaminophen (TYLENOL) 500 MG tablet Take 500-1,000 mg by mouth every 6 (six) hours as needed for mild pain.    Yes Historical Provider, MD  rosuvastatin (CRESTOR) 10 MG tablet Take 10 mg by mouth daily. pw 08/11/16  Yes Historical Provider, MD  traMADol (ULTRAM) 50 MG tablet Take 1 tablet (50 mg total) by mouth every 6 (six) hours as needed for moderate pain. 08/16/16  Yes Lavon Paganini Angiulli, PA-C    Family History Family History  Problem Relation Age of Onset  . Congestive Heart Failure Mother   . Kidney failure Sister   . Hypertension Sister   . Diabetes Mellitus II Brother   . CAD Brother   . Lung cancer Brother   . Colon cancer Neg Hx   . Esophageal cancer Neg Hx     Social History Social History  Substance Use Topics  . Smoking status: Former Research scientist (life sciences)  . Smokeless tobacco: Never Used  . Alcohol use No  Allergies   Gabapentin and Other   Review of Systems Review of Systems  Constitutional: Positive for activity change, appetite change, fatigue and fever.  Respiratory: Negative for cough, shortness of breath and wheezing.   Cardiovascular: Negative for chest pain and leg swelling.  Gastrointestinal: Negative for vomiting.  Genitourinary: Positive for decreased urine volume and difficulty urinating. Negative for discharge and hematuria.  Neurological: Positive for dizziness and weakness. Negative for focal weakness.  All other systems reviewed and are negative.    Physical Exam Updated Vital Signs BP 139/74 (BP Location: Right Arm)   Pulse 83   Temp 99 F (37.2 C) (Oral)   Resp 20   Ht 6\' 3"  (1.905 m)   Wt 255 lb (115.7 kg)   SpO2 97%   BMI 31.87 kg/m   Physical Exam  Constitutional: He is oriented to person,  place, and time. He appears well-developed and well-nourished.  HENT:  Head: Normocephalic and atraumatic.  Eyes: Conjunctivae and EOM are normal.  Neck: Normal range of motion.  Cardiovascular: Normal rate.  Exam reveals no friction rub.   No murmur heard. Pulmonary/Chest: Effort normal. No respiratory distress. He has no wheezes.  Abdominal: He exhibits no distension.  Musculoskeletal: Normal range of motion. He exhibits no edema, tenderness or deformity.  Neurological: He is alert and oriented to person, place, and time. No cranial nerve deficit.  No altered mental status, able to give full seemingly accurate history.  Face is symmetric, EOM's intact, pupils equal and reactive, vision intact, tongue and uvula midline without deviation Slow but Upper and Lower extremity motor 5/5, intact pain perception in distal extremities, 2+ reflexes in biceps, patella and achilles tendons. Finger to nose normal, heel to shin normal.   Nursing note and vitals reviewed.    ED Treatments / Results  Labs (all labs ordered are listed, but only abnormal results are displayed) Labs Reviewed  COMPREHENSIVE METABOLIC PANEL - Abnormal; Notable for the following:       Result Value   CO2 21 (*)    Glucose, Bld 125 (*)    BUN 36 (*)    Creatinine, Ser 2.43 (*)    Albumin 2.7 (*)    AST 55 (*)    GFR calc non Af Amer 23 (*)    GFR calc Af Amer 27 (*)    All other components within normal limits  CBC WITH DIFFERENTIAL/PLATELET - Abnormal; Notable for the following:    WBC 20.0 (*)    RBC 3.74 (*)    Hemoglobin 10.4 (*)    HCT 31.3 (*)    RDW 15.6 (*)    Platelets 423 (*)    Neutro Abs 15.3 (*)    Monocytes Absolute 2.3 (*)    All other components within normal limits  URINALYSIS, ROUTINE W REFLEX MICROSCOPIC (NOT AT [facility]) - Abnormal; Notable for the following:    Color, Urine AMBER (*)    APPearance TURBID (*)    Hgb urine dipstick LARGE (*)    Bilirubin Urine SMALL (*)    Protein, ur >300  (*)    Leukocytes, UA LARGE (*)    All other components within normal limits  TROPONIN I - Abnormal; Notable for the following:    Troponin I 0.04 (*)    All other components within normal limits  URINE MICROSCOPIC-ADD ON - Abnormal; Notable for the following:    Bacteria, UA MANY (*)    All other components within normal limits  LACTIC ACID, PLASMA -  Abnormal; Notable for the following:    Lactic Acid, Venous 2.0 (*)    All other components within normal limits  PROTIME-INR - Abnormal; Notable for the following:    Prothrombin Time 18.1 (*)    All other components within normal limits  TROPONIN I - Abnormal; Notable for the following:    Troponin I 0.04 (*)    All other components within normal limits  I-STAT CG4 LACTIC ACID, ED - Abnormal; Notable for the following:    Lactic Acid, Venous 2.86 (*)    All other components within normal limits  CULTURE, BLOOD (ROUTINE X 2)  CULTURE, BLOOD (ROUTINE X 2)  URINE CULTURE  LACTIC ACID, PLASMA  PROCALCITONIN  APTT  TROPONIN I  BASIC METABOLIC PANEL  CBC    EKG  EKG Interpretation  Date/Time:  Monday October 01 2016 15:30:41 EDT Ventricular Rate:  96 PR Interval:    QRS Duration: 124 QT Interval:  382 QTC Calculation: 483 R Axis:   -62 Text Interpretation:  Sinus rhythm Right bundle branch block ST elevation suggests acute pericarditis Baseline wander in lead(s) II III aVF V1 V4 V5 V6 poor baseline, need repeat, NO CHARGE Confirmed by [facility] MD, Corene Cornea [phone]) on 10/01/2016 4:17:43 PM       Radiology Dg Chest 2 View  Result Date: 10/01/2016 CLINICAL DATA:  Weakness for 2 days. History of urinary tract infection. EXAM: CHEST  2 VIEW COMPARISON:  09/01/2016 FINDINGS: Prior median sternotomy. Moderate thoracic spondylosis. Midline trachea. Mild cardiomegaly. No pleural effusion or pneumothorax. Bibasilar volume loss with relatively similar linear opacities. No lobar consolidation. IMPRESSION: No acute cardiopulmonary disease.  Similar bibasilar volume loss with probable scarring. Electronically Signed   By: Abigail Miyamoto [name]   On: 10/01/2016 16:53    Procedures Procedures (including critical care time)  Angiocath insertion Performed by: Merrily Pew  Consent: Verbal consent obtained. Risks and benefits: risks, benefits and alternatives were discussed Time out: Immediately prior to procedure a "time out" was called to verify the correct patient, procedure, equipment, support staff and site/side marked as required.  Preparation: Patient was prepped and draped in the usual sterile fashion.  Vein Location: right forearm  Ultrasound Guided  Gauge: 20  Normal blood return and flush without difficulty Patient tolerance: Patient tolerated the procedure well with no immediate complications.  Angiocath insertion Performed by: Merrily Pew  Consent: Verbal consent obtained. Risks and benefits: risks, benefits and alternatives were discussed Time out: Immediately prior to procedure a "time out" was called to verify the correct patient, procedure, equipment, support staff and site/side marked as required.  Preparation: Patient was prepped and draped in the usual sterile fashion.  Vein Location: left forearm  Ultrasound Guided  Gauge: 20  Normal blood return and flush without difficulty Patient tolerance: Patient tolerated the procedure well with no immediate complications.    Medications Ordered in ED Medications  rosuvastatin (CRESTOR) tablet 10 mg (not administered)  heparin injection 5,000 Units (5,000 Units Subcutaneous Given 10/01/16 2142)  sodium chloride flush (NS) 0.9 % injection 3 mL (3 mLs Intravenous Given 10/01/16 2142)  0.9 %  sodium chloride infusion ( Intravenous Rate/Dose Change 10/01/16 2319)  acetaminophen (TYLENOL) tablet 650 mg (not administered)    Or  acetaminophen (TYLENOL) suppository 650 mg (not administered)  ondansetron (ZOFRAN) tablet 4 mg (not administered)    Or    ondansetron (ZOFRAN) injection 4 mg (not administered)  ceFEPIme (MAXIPIME) 2 g in dextrose 5 % 50 mL IVPB (2 g Intravenous  Given 10/01/16 2154)  traMADol (ULTRAM) tablet 50-100 mg (50 mg Oral Given 10/01/16 2252)  sodium chloride 0.9 % bolus 1,000 mL (0 mLs Intravenous Stopped 10/01/16 1722)  vancomycin (VANCOCIN) 2,000 mg in sodium chloride 0.9 % 500 mL IVPB (2,000 mg Intravenous New Bag/Given 10/01/16 1723)  piperacillin-tazobactam (ZOSYN) IVPB 3.375 g (0 g Intravenous Stopped 10/01/16 1759)  sodium chloride 0.9 % bolus 1,000 mL (0 mLs Intravenous Stopped 10/01/16 1819)  sodium chloride 0.9 % bolus 1,000 mL (1,000 mLs Intravenous New Bag/Given 10/01/16 1956)     Initial Impression / Assessment and Plan / ED Course  I have reviewed the triage vital signs and the nursing notes.  Pertinent labs & imaging results that were available during my care of the patient were reviewed by me and considered in my medical decision making (see chart for details).  Clinical Course   Suspect UTI or other infection with possible kidney fakilure. Workup accordingly.  Wbc/lactic acid both up, vanc/zosyn initiated.   Sepsis likely 2/2 UTI. No e/o shock but does have AKI with it.   Final Clinical Impressions(s) / ED Diagnoses   Final diagnoses:  Sepsis, due to unspecified organism ([place])  AKI (acute kidney injury) ([place])  Generalized weakness  Lactic acidemia    New Prescriptions Current Discharge Medication List       Merrily Pew, MD 10/02/16 [phone]

## 2016-10-01 NOTE — H&P (Signed)
History and Physical    [name] [id number] DOB: 05-12-1931 DOA: 10/01/2016  PCP: [name], MD   Patient coming from: Home   Chief Complaint: Generalized weakness, loss of appetite, urinary frequency  HPI: [name] is a 82 y.o. male with medical history significant for coronary artery disease, chronic diastolic CHF, and bladder and prostate cancer who presents to the emergency department with 2 days of generalized weakness with urinary frequency and loss of appetite. Patient had been admitted to the hospital in late September of this year with pyelonephritis and was eventually discharged back home in much improved and stable condition. He initially did quite well at home until the insidious loss of appetite which started approximately one week ago, urinary frequency also developing this week, and now 2 days of profound generalized weakness and fatigue. Patient denies dysuria, but endorses urinary frequency. He denies flank pain, but endorses fevers and chills for the past 2 days. He denies any recent chest pain or palpitations and denies dyspnea or cough. There has been no extremity swelling or orthopnea. Patient denies any rash or wound, denies change in vision or hearing, and denies focal numbness or weakness. EMS was called out to his house and 1 g of Tylenol was administered before his arrival in the ED. Patient has recent history of urine culture growing Pseudomonas which was pansensitive.  ED Course: Upon arrival to the ED, patient is found to be afebrile, saturating well on room air, with blood pressure 90/70, and vitals otherwise stable. EKG demonstrates a sinus rhythm with chronic right bundle branch block and diffuse ST changes suggestive of possible pericarditis. Chest x-ray is negative for acute cardiopulmonary disease. Chemistry panel is notable for a BUN of 36 and creatinine of 2.43, up from less than 1 in August of this year. CBC features a new leukocytosis to  20,000 and a thrombocytosis to 423,000, as well as a stable normocytic anemia with hemoglobin of 10.4. Lactic acid is elevated to a value of 2.86 and troponin is mildly elevated to 0.04. Blood and urine cultures were obtained, 30 cc/kg of normal saline was given, and empiric vancomycin and Zosyn were administered. Patient remained hemodynamically stable in the ED and in no apparent respiratory distress. He will be admitted to the telemetry unit for ongoing evaluation and management of sepsis suspected secondary to UTI.  Review of Systems:  All other systems reviewed and apart from HPI, are negative.  Past Medical History:  Diagnosis Date  . Bladder cancer ([place]) 05/2015  . CAD (coronary artery disease)   . Hyperlipidemia   . Hypertension   . Kidney stones   . Prostate CA [facility])     Past Surgical History:  Procedure Laterality Date  . CARDIAC SURGERY    . CORONARY ARTERY BYPASS GRAFT    . PENILE PROSTHESIS IMPLANT    . PROSTATE SURGERY       reports that he has quit smoking. He has never used smokeless tobacco. He reports that he does not drink alcohol or use drugs.  Allergies  Allergen Reactions  . Gabapentin Other (See Comments)    Shaking  . Other Other (See Comments)    Antihistamines-unknown reaction    Family History  Problem Relation Age of Onset  . Congestive Heart Failure Mother   . Kidney failure Sister   . Hypertension Sister   . Diabetes Mellitus II Brother   . CAD Brother   . Lung cancer Brother   . Colon cancer Neg  Hx   . Esophageal cancer Neg Hx      Prior to Admission medications   Medication Sig Start Date End Date Taking? Authorizing Provider  acetaminophen (TYLENOL) 500 MG tablet Take 500-1,000 mg by mouth every 6 (six) hours as needed for mild pain.    Yes Historical Provider, MD  rosuvastatin (CRESTOR) 10 MG tablet Take 10 mg by mouth daily. pw 08/11/16  Yes Historical Provider, MD  traMADol (ULTRAM) 50 MG tablet Take 1 tablet (50 mg total) by mouth  every 6 (six) hours as needed for moderate pain. 08/16/16  Yes Cathlyn Parsons, PA-C    Physical Exam: Vitals:   10/01/16 1523 10/01/16 1531 10/01/16 1738  BP:  90/71 97/67  Pulse:  98 83  Resp:  22 15  Temp:  99.1 F (37.3 C)   SpO2:  96% 97%  Weight: 122 kg (269 lb)    Height: 6\' 3"  (1.905 m)        Constitutional: NAD, calm, chronically-ill appearing Eyes: PERTLA, lids and conjunctivae normal ENMT: Mucous membranes are dry. Posterior pharynx clear of any exudate or lesions.   Neck: normal, supple, no masses, no thyromegaly Respiratory: clear to auscultation bilaterally, no wheezing, no crackles. Normal respiratory effort.   Cardiovascular: S1 & S2 heard, regular rate and rhythm. No extremity edema. No significant JVD. Abdomen: No distension, no tenderness, no masses palpated. Bowel sounds normal.  Musculoskeletal: no clubbing / cyanosis. No joint deformity upper and lower extremities. Normal muscle tone.  Skin: no significant rashes, lesions, ulcers. Warm, dry, well-perfused. Seborrhea about mid-face. Neurologic: CN 2-12 grossly intact. Sensation intact, DTR normal. Strength 5/5 in all 4 limbs.  Psychiatric: Normal judgment and insight. Alert and oriented x 3. Normal mood and affect.     Labs on Admission: I have personally reviewed following labs and imaging studies  CBC:  Recent Labs Lab 10/01/16 1618  WBC 20.0*  NEUTROABS 15.3*  HGB 10.4*  HCT 31.3*  MCV 83.7  PLT 99991111*   Basic Metabolic Panel:  Recent Labs Lab 10/01/16 1618  NA 139  K 4.6  CL 110  CO2 21*  GLUCOSE 125*  BUN 36*  CREATININE 2.43*  CALCIUM 9.8   GFR: Estimated Creatinine Clearance: 32.4 mL/min (by C-G formula based on SCr of 2.43 mg/dL (H)). Liver Function Tests:  Recent Labs Lab 10/01/16 1618  AST 55*  ALT 35  ALKPHOS 62  BILITOT 0.4  PROT 8.1  ALBUMIN 2.7*   No results for input(s): LIPASE, AMYLASE in the last 168 hours. No results for input(s): AMMONIA in the last 168  hours. Coagulation Profile: No results for input(s): INR, PROTIME in the last 168 hours. Cardiac Enzymes:  Recent Labs Lab 10/01/16 1618  TROPONINI 0.04*   BNP (last 3 results) No results for input(s): PROBNP in the last 8760 hours. HbA1C: No results for input(s): HGBA1C in the last 72 hours. CBG: No results for input(s): GLUCAP in the last 168 hours. Lipid Profile: No results for input(s): CHOL, HDL, LDLCALC, TRIG, CHOLHDL, LDLDIRECT in the last 72 hours. Thyroid Function Tests: No results for input(s): TSH, T4TOTAL, FREET4, T3FREE, THYROIDAB in the last 72 hours. Anemia Panel: No results for input(s): VITAMINB12, FOLATE, FERRITIN, TIBC, IRON, RETICCTPCT in the last 72 hours. Urine analysis:    Component Value Date/Time   COLORURINE AMBER (A) 10/01/2016 1759   APPEARANCEUR TURBID (A) 10/01/2016 1759   LABSPEC 1.022 10/01/2016 1759   PHURINE 6.5 10/01/2016 1759   GLUCOSEU NEGATIVE 10/01/2016 1759  HGBUR LARGE (A) 10/01/2016 1759   BILIRUBINUR SMALL (A) 10/01/2016 1759   KETONESUR NEGATIVE 10/01/2016 1759   PROTEINUR >300 (A) 10/01/2016 1759   UROBILINOGEN 0.2 09/16/2015 1741   NITRITE NEGATIVE 10/01/2016 1759   LEUKOCYTESUR LARGE (A) 10/01/2016 1759   Sepsis Labs: @LABRCNTIP (procalcitonin:4,lacticidven:4) )No results found for this or any previous visit (from the past 240 hour(s)).   Radiological Exams on Admission: Dg Chest 2 View  Result Date: 10/01/2016 CLINICAL DATA:  Weakness for 2 days. History of urinary tract infection. EXAM: CHEST  2 VIEW COMPARISON:  09/01/2016 FINDINGS: Prior median sternotomy. Moderate thoracic spondylosis. Midline trachea. Mild cardiomegaly. No pleural effusion or pneumothorax. Bibasilar volume loss with relatively similar linear opacities. No lobar consolidation. IMPRESSION: No acute cardiopulmonary disease. Similar bibasilar volume loss with probable scarring. Electronically Signed   By: Abigail Miyamoto [name]   On: 10/01/2016 16:53    EKG:  Independently reviewed. Sinus rhythm, chronic RBBB, non-specific ST changes diffusely  Assessment/Plan  1. Sepsis secondary to UTI  - Fevers at home, treated with APAP by EMS PTA; p/w with leukocytosis, AKI, and elevated lactate  - CXR clear, UA consistent with infection  - Lactic acid elevated to 2.86 initially  - 30 cc/kg NS given in ED; blood and urine cultures incubating; empiric vancomycin and Zosyn started in ED - Continue empiric treatment with cefepime while awaiting culture data; prior urine cx grew Pseudomonas  - Trend lactate, continue IVF hydration, follow cultures   2. AKI  - SCr 2.43 on admission, up from apparent baseline of ~1  - Likely a prerenal azotemia in setting of sepsis and poor appetite; ATN possible  - Anticipate improvement with IVF resuscitation; 30 cc/kg of NS given in ED; will continue NS at 100 cc/hr, following strict I/O's and mindful of his chronic diastolic CHF  - Repeat chem panel in am; if fails to improve as expected, may need to obtain urine studies and imaging   3. CAD - No anginal complaints on admission  - Non-specific ST changes on admission EKG suggests possible pericarditis, but this does not correlate clinically - Troponin mildly elevated at 0.04, possible reflecting a demand-ischemia in setting of sepsis  - Monitor on telemetry; given the EKG changes of uncertain significance with positive troponin, will trend it - Continue statin     DVT prophylaxis: sq heparin Code Status: Full  Family Communication: Patient's wife updated at bedside  Disposition Plan: Admit to telemetry Consults called: None Admission status: Inpatient    Vianne Bulls, MD [facility] Pager [phone]  If 7PM-7AM, please contact night-coverage [email or web address] Password [facility]  10/01/2016, 7:43 PM

## 2016-10-01 NOTE — Progress Notes (Signed)
EDCM went to speak to patient at bedside, however, RN at bedside performing procedure.

## 2016-10-01 NOTE — ED Notes (Signed)
Troponin 0.04

## 2016-10-01 NOTE — ED Triage Notes (Signed)
Pt is from home.  EMS gave 100omg Tylenol PO at 1452.  C/o weakness x 2 days.  Hx of UTI.  HHRN states that she feels the UTI is back.  Pt reports urinating regularly with no S&S of UTI.  Pt denies cough.

## 2016-10-01 NOTE — ED Notes (Signed)
Pt aware of need for urine sample.  

## 2016-10-02 DIAGNOSIS — R531 Weakness: Secondary | ICD-10-CM

## 2016-10-02 DIAGNOSIS — C679 Malignant neoplasm of bladder, unspecified: Secondary | ICD-10-CM

## 2016-10-02 DIAGNOSIS — D649 Anemia, unspecified: Secondary | ICD-10-CM

## 2016-10-02 LAB — CBC
HCT: 28.6 % — ABNORMAL LOW (ref 39.0–52.0)
Hemoglobin: 9 g/dL — ABNORMAL LOW (ref 13.0–17.0)
MCH: 27.2 pg (ref 26.0–34.0)
MCHC: 31.5 g/dL (ref 30.0–36.0)
MCV: 86.4 fL (ref 78.0–100.0)
Platelets: 354 10*3/uL (ref 150–400)
RBC: 3.31 MIL/uL — ABNORMAL LOW (ref 4.22–5.81)
RDW: 16 % — AB (ref 11.5–15.5)
WBC: 16.9 10*3/uL — ABNORMAL HIGH (ref 4.0–10.5)

## 2016-10-02 LAB — BASIC METABOLIC PANEL
ANION GAP: 7 (ref 5–15)
BUN: 31 mg/dL — AB (ref 6–20)
CALCIUM: 8.6 mg/dL — AB (ref 8.9–10.3)
CO2: 17 mmol/L — ABNORMAL LOW (ref 22–32)
Chloride: 117 mmol/L — ABNORMAL HIGH (ref 101–111)
Creatinine, Ser: 2.02 mg/dL — ABNORMAL HIGH (ref 0.61–1.24)
GFR calc Af Amer: 33 mL/min — ABNORMAL LOW (ref >60)
GFR, EST NON AFRICAN AMERICAN: 29 mL/min — AB (ref >60)
GLUCOSE: 113 mg/dL — AB (ref 65–99)
Potassium: 4.4 mmol/L (ref 3.5–5.1)
Sodium: 141 mmol/L (ref 135–145)

## 2016-10-02 LAB — TROPONIN I: TROPONIN I: 0.03 ng/mL — AB (ref <0.03)

## 2016-10-02 LAB — GLUCOSE, CAPILLARY: Glucose-Capillary: 107 mg/dL — ABNORMAL HIGH (ref 65–99)

## 2016-10-02 MED ORDER — ASPIRIN EC 81 MG PO TBEC
81.0000 mg | DELAYED_RELEASE_TABLET | Freq: Every day | ORAL | Status: DC
  Administered 2016-10-02 – 2016-10-03 (×2): 81 mg via ORAL
  Filled 2016-10-02 (×2): qty 1

## 2016-10-02 MED ORDER — TRAMADOL HCL 50 MG PO TABS
100.0000 mg | ORAL_TABLET | Freq: Four times a day (QID) | ORAL | Status: DC | PRN
  Administered 2016-10-02: 50 mg via ORAL
  Administered 2016-10-03: 100 mg via ORAL
  Filled 2016-10-02 (×2): qty 2

## 2016-10-02 NOTE — Care Management Note (Signed)
Case Management Note  Patient Details  Name: [name] MRN: [id number] Date of Birth: 06/27/1931  Subjective/Objective:82 y/o m admitted w/Sepsis. Readmit-9/23-9/26-Pyelonephritis. From home. Active w/[facility] HHRN/PT/aide,sw-[facility] rep Manuela Schwartz already following.Noted 4 admissions/27months-will explore [facility]. PT-cons-await recc.                    Action/Plan:d/c plan home w/HHC.   Expected Discharge Date:                  Expected Discharge Plan:  [facility]  In-House Referral:     Discharge planning Services     Post Acute Care Choice:  Home Health (Active w/[facility] HHRN/PT/aide/sw) Choice offered to:     DME Arranged:    DME Agency:     HH Arranged:    HH Agency:     Status of Service:  In process, will continue to follow  If discussed at Long Length of Stay Meetings, dates discussed:    Additional Comments:  Dessa Phi, RN 10/02/2016, 3:29 PM

## 2016-10-02 NOTE — Progress Notes (Signed)
PROGRESS NOTE  [name]  [id number] DOB: February 03, 1931 DOA: 10/01/2016 PCP: [name], MD  Brief Narrative:   [name] is a 82 y.o. male with medical history significant for coronary artery disease, chronic diastolic CHF, and bladder and prostate cancer, recurrent UTI and history of Pseudomonas infection, who presented to the emergency department with 2 days of generalized weakness with urinary frequency, dysuria, and loss of appetite.  He was found to have sepsis due to UTI and was mildly hypotensive in the ER.  He was incidentally found to have AKI with creatinine up to 2.43 mg/dl with baseline of 1.  He was given IVF and cefepime.    Assessment & Plan:   Principal Problem:   Sepsis due to urinary tract infection ([place]) Active Problems:   Normochromic normocytic anemia   Coronary artery disease involving coronary bypass graft of native heart without angina pectoris   Recurrent UTI   Bladder cancer ([facility])   Generalized weakness   Acute kidney injury ([place])  Sepsis secondary to UTI, present at time of admission.  Fevers and leukocytosis are improving. - Fevers at home, treated with APAP by EMS PTA; p/w with leukocytosis, AKI, and elevated lactate  - CXR clear, UA consistent with infection  - blood culture pending -  Urine culture pending -  Continue cefepime -  Continue IVF  AKI with nongap metabolic acidosis due to dehydration and sepsis, creatinine trending down with IVF - SCr 2.43 on admission, up from baseline of ~1  - continue IVF  CAD s/p CABG, chest pain free - Non-specific ST changes on admission EKG likely due to RBBB - Troponin minimally elevated but flat due to AKI - Continue statin  - resume aspirin  Hx of bladder and prostate cancer.  Follow [facility] with [facility] at [facility]  Normocytic anemia -  Iron studies, B12, folate -  TSH -  Occult stool -  Repeat hgb in AM  Generalized weakness, PT evaluation  DVT prophylaxis: sq heparin Code  Status: Full  Family Communication: Patient's wife updated at bedside  Disposition Plan: Admit to telemetry  Consultants:   none  Procedures:  none  Antimicrobials:  Anti-infectives    Start     Dose/Rate Route Frequency Ordered Stop   10/01/16 2200  ceFEPIme (MAXIPIME) 2 g in dextrose 5 % 50 mL IVPB     2 g 100 mL/hr over 30 Minutes Intravenous Every 24 hours 10/01/16 1952     10/01/16 1945  ceFEPIme (MAXIPIME) 2 g in dextrose 5 % 50 mL IVPB  Status:  Discontinued     2 g 100 mL/hr over 30 Minutes Intravenous  Once 10/01/16 1943 10/01/16 1952   10/01/16 1700  vancomycin (VANCOCIN) 2,000 mg in sodium chloride 0.9 % 500 mL IVPB     2,000 mg 250 mL/hr over 120 Minutes Intravenous  Once 10/01/16 1639 10/01/16 1923   10/01/16 1645  piperacillin-tazobactam (ZOSYN) IVPB 3.375 g     3.375 g 100 mL/hr over 30 Minutes Intravenous  Once 10/01/16 1639 10/01/16 1759         Subjective: Having some pain in the lower abdomen and diarrhea.  Denies nausea and vomiting.  Had dysuria/bladder spasms last few days, particularly at night.    Objective: Vitals:   10/01/16 2009 10/01/16 2117 10/02/16 0546 10/02/16 0700  BP: 133/80 139/74 124/72 121/67  Pulse: 75 83 95 87  Resp: 16 20 18 18   Temp:  99 F (37.2 C) 99.8 F (37.7  C) 99.3 F (37.4 C)  TempSrc:  Oral Oral Oral  SpO2: 100% 97% 97% 98%  Weight:  115.7 kg (255 lb) 116.4 kg (256 lb 11.2 oz)   Height:  6\' 3"  (1.905 m)      Intake/Output Summary (Last 24 hours) at 10/02/16 1357 Last data filed at 10/02/16 1100  Gross per 24 hour  Intake          3528.75 ml  Output              500 ml  Net          3028.75 ml   Filed Weights   10/01/16 1523 10/01/16 2117 10/02/16 0546  Weight: 122 kg (269 lb) 115.7 kg (255 lb) 116.4 kg (256 lb 11.2 oz)    Examination:  General exam:  Adult male.  No acute distress.  HEENT:  NCAT, MMM Respiratory system: Clear to auscultation bilaterally Cardiovascular system: Regular rate and rhythm,  normal S1/S2.  Warm extremities Gastrointestinal system: Normal active bowel sounds, soft, nondistended, TTP in the RLQ and suprapubic area without rebound or guarding.  Catheter bag with dark-colored and purulent urine  MSK:  Normal tone and bulk, no lower extremity edema Neuro:  Grossly intact    Data Reviewed: I have personally reviewed following labs and imaging studies  CBC:  Recent Labs Lab 10/01/16 1618 10/02/16 0453  WBC 20.0* 16.9*  NEUTROABS 15.3*  --   HGB 10.4* 9.0*  HCT 31.3* 28.6*  MCV 83.7 86.4  PLT 423* [id number]   Basic Metabolic Panel:  Recent Labs Lab 10/01/16 1618 10/02/16 0453  NA 139 141  K 4.6 4.4  CL 110 117*  CO2 21* 17*  GLUCOSE 125* 113*  BUN 36* 31*  CREATININE 2.43* 2.02*  CALCIUM 9.8 8.6*   GFR: Estimated Creatinine Clearance: 38.1 mL/min (by C-G formula based on SCr of 2.02 mg/dL (H)). Liver Function Tests:  Recent Labs Lab 10/01/16 1618  AST 55*  ALT 35  ALKPHOS 62  BILITOT 0.4  PROT 8.1  ALBUMIN 2.7*   No results for input(s): LIPASE, AMYLASE in the last 168 hours. No results for input(s): AMMONIA in the last 168 hours. Coagulation Profile:  Recent Labs Lab 10/01/16 1948  INR 1.48   Cardiac Enzymes:  Recent Labs Lab 10/01/16 1618 10/01/16 2237 10/02/16 0453  TROPONINI 0.04* 0.04* 0.03*   BNP (last 3 results) No results for input(s): PROBNP in the last 8760 hours. HbA1C: No results for input(s): HGBA1C in the last 72 hours. CBG:  Recent Labs Lab 10/02/16 0757  GLUCAP 107*   Lipid Profile: No results for input(s): CHOL, HDL, LDLCALC, TRIG, CHOLHDL, LDLDIRECT in the last 72 hours. Thyroid Function Tests: No results for input(s): TSH, T4TOTAL, FREET4, T3FREE, THYROIDAB in the last 72 hours. Anemia Panel: No results for input(s): VITAMINB12, FOLATE, FERRITIN, TIBC, IRON, RETICCTPCT in the last 72 hours. Urine analysis:    Component Value Date/Time   COLORURINE AMBER (A) 10/01/2016 1759   APPEARANCEUR  TURBID (A) 10/01/2016 1759   LABSPEC 1.022 10/01/2016 1759   PHURINE 6.5 10/01/2016 1759   GLUCOSEU NEGATIVE 10/01/2016 1759   HGBUR LARGE (A) 10/01/2016 1759   BILIRUBINUR SMALL (A) 10/01/2016 1759   KETONESUR NEGATIVE 10/01/2016 1759   PROTEINUR >300 (A) 10/01/2016 1759   UROBILINOGEN 0.2 09/16/2015 1741   NITRITE NEGATIVE 10/01/2016 1759   LEUKOCYTESUR LARGE (A) 10/01/2016 1759   Sepsis Labs: @LABRCNTIP (procalcitonin:4,lacticidven:4)  ) Recent Results (from the past 240 hour(s))  Blood Culture (routine  x 2)     Status: None (Preliminary result)   Collection Time: 10/01/16  4:15 PM  Result Value Ref Range Status   Specimen Description BLOOD RIGHT ANTECUBITAL  Final   Special Requests BOTTLES DRAWN AEROBIC AND ANAEROBIC 5CC EACH  Final   Culture   Final    NO GROWTH < 24 HOURS Performed at [facility]    Report Status PENDING  Incomplete  Blood Culture (routine x 2)     Status: None (Preliminary result)   Collection Time: 10/01/16  4:15 PM  Result Value Ref Range Status   Specimen Description BLOOD LEFT ANTECUBITAL  Final   Special Requests BOTTLES DRAWN AEROBIC AND ANAEROBIC 5CC  Final   Culture   Final    NO GROWTH < 24 HOURS Performed at [facility]    Report Status PENDING  Incomplete      Radiology Studies: Dg Chest 2 View  Result Date: 10/01/2016 CLINICAL DATA:  Weakness for 2 days. History of urinary tract infection. EXAM: CHEST  2 VIEW COMPARISON:  09/01/2016 FINDINGS: Prior median sternotomy. Moderate thoracic spondylosis. Midline trachea. Mild cardiomegaly. No pleural effusion or pneumothorax. Bibasilar volume loss with relatively similar linear opacities. No lobar consolidation. IMPRESSION: No acute cardiopulmonary disease. Similar bibasilar volume loss with probable scarring. Electronically Signed   By: Abigail Miyamoto [name]   On: 10/01/2016 16:53     Scheduled Meds: . aspirin EC  81 mg Oral Daily  . ceFEPime (MAXIPIME) IV  2 g Intravenous  Q24H  . heparin  5,000 Units Subcutaneous Q8H  . rosuvastatin  10 mg Oral Daily  . sodium chloride flush  3 mL Intravenous Q12H   Continuous Infusions:    LOS: 1 day    Time spent: 30 min    Janece Canterbury, MD [facility] Pager [phone]  If 7PM-7AM, please contact night-coverage [email or web address] Password [email or web address] 10/02/2016, 1:57 PM

## 2016-10-03 ENCOUNTER — Inpatient Hospital Stay (HOSPITAL_COMMUNITY): Payer: Medicare Other

## 2016-10-03 ENCOUNTER — Ambulatory Visit (HOSPITAL_COMMUNITY): Payer: Medicare Other | Attending: Physician Assistant

## 2016-10-03 LAB — BASIC METABOLIC PANEL
Anion gap: 6 (ref 5–15)
BUN: 24 mg/dL — AB (ref 6–20)
CHLORIDE: 115 mmol/L — AB (ref 101–111)
CO2: 19 mmol/L — AB (ref 22–32)
CREATININE: 1.74 mg/dL — AB (ref 0.61–1.24)
Calcium: 8.8 mg/dL — ABNORMAL LOW (ref 8.9–10.3)
GFR calc Af Amer: 40 mL/min — ABNORMAL LOW (ref >60)
GFR calc non Af Amer: 35 mL/min — ABNORMAL LOW (ref >60)
Glucose, Bld: 95 mg/dL (ref 65–99)
Potassium: 4.7 mmol/L (ref 3.5–5.1)
Sodium: 140 mmol/L (ref 135–145)

## 2016-10-03 LAB — CBC
HCT: 28.4 % — ABNORMAL LOW (ref 39.0–52.0)
Hemoglobin: 9.3 g/dL — ABNORMAL LOW (ref 13.0–17.0)
MCH: 27.5 pg (ref 26.0–34.0)
MCHC: 32.7 g/dL (ref 30.0–36.0)
MCV: 84 fL (ref 78.0–100.0)
PLATELETS: ADEQUATE 10*3/uL (ref 150–400)
RBC: 3.38 MIL/uL — ABNORMAL LOW (ref 4.22–5.81)
RDW: 15.9 % — AB (ref 11.5–15.5)
WBC: 11.1 10*3/uL — ABNORMAL HIGH (ref 4.0–10.5)

## 2016-10-03 LAB — FERRITIN: Ferritin: 645 ng/mL — ABNORMAL HIGH (ref 24–336)

## 2016-10-03 LAB — IRON AND TIBC
Iron: 22 ug/dL — ABNORMAL LOW (ref 45–182)
Saturation Ratios: 14 % — ABNORMAL LOW (ref 17.9–39.5)
TIBC: 158 ug/dL — ABNORMAL LOW (ref 250–450)
UIBC: 136 ug/dL

## 2016-10-03 LAB — VITAMIN B12: Vitamin B-12: 1023 pg/mL — ABNORMAL HIGH (ref 180–914)

## 2016-10-03 LAB — TSH: TSH: 1.762 u[IU]/mL (ref 0.350–4.500)

## 2016-10-03 LAB — GLUCOSE, CAPILLARY: Glucose-Capillary: 94 mg/dL (ref 65–99)

## 2016-10-03 LAB — FOLATE: FOLATE: 10.1 ng/mL (ref >5.9)

## 2016-10-03 MED ORDER — CIPROFLOXACIN HCL 500 MG PO TABS
500.0000 mg | ORAL_TABLET | Freq: Two times a day (BID) | ORAL | Status: DC
  Administered 2016-10-03: 500 mg via ORAL
  Filled 2016-10-03: qty 1

## 2016-10-03 MED ORDER — CIPROFLOXACIN HCL 500 MG PO TABS: 500.0000 mg | ORAL_TABLET | Freq: Two times a day (BID) | ORAL | 0 refills | Status: AC

## 2016-10-03 MED ORDER — ASPIRIN 81 MG PO TBEC: 81.0000 mg | DELAYED_RELEASE_TABLET | Freq: Every day | ORAL | Status: DC

## 2016-10-03 NOTE — Progress Notes (Signed)
Occupational Therapy Evaluation Patient Details Name: [name] MRN: [id number] DOB: 04/24/1931 Today's Date: 10/03/2016    History of Present Illness 82 yo male admitted with sepsis. Hx of CAD, CHF, prostate cancer, HTN   Clinical Impression   Patient presents to OT with decreased ADL independence and safety due to the deficits listed below. He will benefit from skilled OT to maximize function and to facilitate a safe discharge. OT will follow.    Follow Up Recommendations  Home health OT;Supervision/Assistance - 24 hour    Equipment Recommendations  3 in 1 bedside comode    Recommendations for Other Services       Precautions / Restrictions Precautions Precautions: Fall Restrictions Weight Bearing Restrictions: No      Mobility Bed Mobility            General bed mobility comments: NT -- up in recliner upon arrival  Transfers Overall transfer level: Needs assistance Equipment used: Rolling walker (2 wheeled) Transfers: Sit to/from Stand Sit to Stand: Min assist;From elevated surface         General transfer comment: x2. Assist to stabilize walker. Cues for safety, hand placement.     Balance Overall balance assessment: Needs assistance           Standing balance-Leahy Scale: Poor Standing balance comment: RW required                            ADL Overall ADL's : Needs assistance/impaired Eating/Feeding: Independent;Sitting   Grooming: Set up;Sitting   Upper Body Bathing: Minimal assitance;Sitting   Lower Body Bathing: Minimal assistance;Moderate assistance;Sit to/from stand           Toilet Transfer: Minimal assistance;Ambulation;Comfort height toilet;RW   Toileting- Clothing Manipulation and Hygiene: Minimal assistance;Sit to/from stand       Functional mobility during ADLs: Minimal assistance;Rolling walker       Vision     Perception     Praxis      Pertinent Vitals/Pain Pain Assessment: No/denies  pain     Hand Dominance     Extremity/Trunk Assessment Upper Extremity Assessment Upper Extremity Assessment: Overall WFL for tasks assessed   Lower Extremity Assessment Lower Extremity Assessment: Defer to PT evaluation   Cervical / Trunk Assessment Cervical / Trunk Assessment: Normal   Communication Communication Communication: HOH   Cognition Arousal/Alertness: Awake/alert Behavior During Therapy: WFL for tasks assessed/performed Overall Cognitive Status: Within Functional Limits for tasks assessed                     General Comments       Exercises       Shoulder Instructions      Home Living Family/patient expects to be discharged to:: Private residence Living Arrangements: Spouse/significant other Available Help at Discharge: Family;Available 24 hours/day Type of Home: House Home Access: Stairs to enter [employer] of Steps: 1 step Entrance Stairs-Rails: None Home Layout: One level     Bathroom Shower/Tub: Corporate investment banker: Standard Bathroom Accessibility: Yes How Accessible: Accessible via walker Home Equipment: [place] - 2 wheels;Walker - 4 wheels;Cane - single point   Additional Comments: Patient reports no 3 in 1 and no shower chair -- different from case management note from this admission      Prior Functioning/Environment Level of Independence: Independent with assistive device(s)        Comments: sponge bathes at home -- [facility] to  do bathroom modifications        OT Problem List: Decreased strength;Decreased activity tolerance;Decreased knowledge of use of DME or AE;Decreased safety awareness   OT Treatment/Interventions: Self-care/ADL training;Therapeutic exercise;DME and/or AE instruction;Therapeutic activities;Patient/family education    OT Goals(Current goals can be found in the care plan section) Acute Rehab OT Goals Patient Stated Goal: none stated OT Goal Formulation: With  patient Time For Goal Achievement: 10/10/16 Potential to Achieve Goals: Good ADL Goals Pt Will Perform Upper Body Bathing: with set-up;sitting Pt Will Perform Lower Body Bathing: with supervision;sit to/from stand Pt Will Perform Upper Body Dressing: with set-up;sitting Pt Will Perform Lower Body Dressing: with supervision;sit to/from stand Pt Will Transfer to Toilet: with supervision;ambulating;bedside commode Pt Will Perform Toileting - Clothing Manipulation and hygiene: with supervision;sit to/from stand  OT Frequency: Min 2X/week   Barriers to D/C:            Co-evaluation              End of Session Equipment Utilized During Treatment: Rolling walker  Activity Tolerance: Patient tolerated treatment well Patient left: in chair;with call bell/phone within reach;with chair alarm set   Time: [id number] OT Time Calculation (min): 14 min Charges:  OT General Charges $OT Visit: 1 Procedure OT Evaluation $OT Eval Low Complexity: 1 Procedure G-Codes:    Tristine Langi A 10-18-2016, 11:14 AM

## 2016-10-03 NOTE — Evaluation (Signed)
Physical Therapy Evaluation Patient Details Name: [name] MRN: [id number] DOB: December 14, 1930 Today's Date: 10/03/2016   History of Present Illness  82 yo male admitted with sepsis. Hx of CAD, CHF, prostate cancer, HTN  Clinical Impression  On eval, pt required Min guard-Min assist for mobility. He walked ~75 feet with a RW. Pt participated well with therapy. Recommend HHPT at discharge.     Follow Up Recommendations Home health PT;Supervision/Assistance - 24 hour    Equipment Recommendations  None recommended by PT    Recommendations for Other Services OT consult     Precautions / Restrictions Precautions Precautions: Fall Restrictions Weight Bearing Restrictions: No      Mobility  Bed Mobility Overal bed mobility: Needs Assistance Bed Mobility: Supine to Sit     Supine to sit: HOB elevated;Min guard     General bed mobility comments: Increased time. Bedrail required.   Transfers Overall transfer level: Needs assistance Equipment used: Rolling walker (2 wheeled) Transfers: Sit to/from Stand Sit to Stand: From elevated surface         General transfer comment: x2. Assist to stabilize walker. Cues for safety, hand placement.   Ambulation/Gait Ambulation/Gait assistance: Min guard Ambulation Distance (Feet): 75 Feet Assistive device: Rolling walker (2 wheeled) Gait Pattern/deviations: Trunk flexed;Decreased stride length     General Gait Details: close guard for safety.   Stairs            Wheelchair Mobility    Modified Rankin (Stroke Patients Only)       Balance Overall balance assessment: Needs assistance           Standing balance-Leahy Scale: Poor Standing balance comment: RW required                             Pertinent Vitals/Pain Pain Assessment: No/denies pain    Home Living Family/patient expects to be discharged to:: Private residence Living Arrangements: Spouse/significant other Available Help at  Discharge: Family;Available 24 hours/day Type of Home: House Home Access: Stairs to enter Entrance Stairs-Rails: None Entrance Stairs-Number of Steps: 1 step Home Layout: One level Home Equipment: Walker - 2 wheels;Walker - 4 wheels;Cane - single point Additional Comments: Patient reports no 3 in 1 and no shower chair -- different from case management note from this admission    Prior Function Level of Independence: Independent with assistive device(s)         Comments: sponge bathes at home -- [facility] for [facility] to do bathroom modifications     Hand Dominance        Extremity/Trunk Assessment   Upper Extremity Assessment: Defer to OT evaluation           Lower Extremity Assessment: Generalized weakness      Cervical / Trunk Assessment: Normal  Communication   Communication: HOH  Cognition Arousal/Alertness: Awake/alert Behavior During Therapy: WFL for tasks assessed/performed Overall Cognitive Status: Within Functional Limits for tasks assessed                      General Comments      Exercises     Assessment/Plan    PT Assessment Patient needs continued PT services  PT Problem List Decreased strength;Decreased mobility;Decreased balance;Decreased knowledge of use of DME;Decreased activity tolerance          PT Treatment Interventions DME instruction;Therapeutic activities;Therapeutic exercise;Gait training;Functional mobility training;Patient/family education;Balance training    PT Goals (Current goals can be found  in the Care Plan section)  Acute Rehab PT Goals Patient Stated Goal: none stated PT Goal Formulation: With patient Time For Goal Achievement: 10/17/16 Potential to Achieve Goals: Good    Frequency Min 3X/week   Barriers to discharge        Co-evaluation               End of Session Equipment Utilized During Treatment: Gait belt Activity Tolerance: Patient tolerated treatment well Patient left: in chair;with call  bell/phone within reach;with chair alarm set           Time: [id number] PT Time Calculation (min) (ACUTE ONLY): 24 min   Charges:   PT Evaluation $PT Eval Low Complexity: 1 Procedure PT Treatments $Gait Training: 8-22 mins   PT G Codes:        Weston Anna, MPT Pager: [phone]

## 2016-10-03 NOTE — Discharge Summary (Signed)
Physician Discharge Summary  HASSAN ALOISI [id number] DOB: [birth year] DOA: 10/01/2016  PCP: [name], MD  Admit date: 10/01/2016 Discharge date: 10/03/2016  Admitted From: Home  Disposition:  Home   Recommendations for Outpatient Follow-up:  1. Follow up with PCP in 1 weeks 2. Follow up with urologist in 1-2 weeks  Home Health: YES PT/OT/AIDE/SW/RN  Discharge Condition: STABLE CODE STATUS: FULL  Brief/Interim Summary:  Eric Form Spenceris a 82 y.o.malewith medical history significant for coronary artery disease, chronic diastolic CHF, and bladder and prostate cancer, recurrent UTI and history of Pseudomonas infection, who presented to the emergency department with 2 days of generalized weakness with urinary frequency, dysuria, and loss of appetite. He was found to have sepsis due to UTI and was mildly hypotensive in the ER.  He was incidentally found to have AKI with creatinine up to 2.43 mg/dl with baseline of 1.  He was given IVF and cefepime.    Assessment & Plan:   Principal Problem:   Sepsis due to urinary tract infection ([place]) Active Problems:   Normochromic normocytic anemia   Coronary artery disease involving coronary bypass graft of native heart without angina pectoris   Recurrent UTI   Bladder cancer (HCC)   Generalized weakness   Acute kidney injury ([place])  Sepsis secondary to UTI, present at time of admission.  Fevers and leukocytosis are improving. - Fevers at home, treated with APAP by EMS PTA; p/w with leukocytosis, AKI, and elevated lactate  - CXR clear, UA consistent with infection  - blood culture NGTD -  Urine culture: multiple species present -  initially treated with cefepime, de-escalated to ciprofloxacin - to complete 10 days  AKI with nongap metabolic acidosis due to dehydration and sepsis, creatinine trending down with IVF - SCr 2.43 on admission, up from baseline of ~1  - improved with hydration  CAD s/p CABG, chest pain  free - Non-specific ST changes on admission EKG likely due to RBBB - Troponin minimally elevated but flat due to AKI - Continue statin  - resume aspirin  Hx of bladder and prostate cancer.  Follow up [facility] at [facility] Dr. Felicie Morn  Normocytic anemia - Hemoglobin remained stable  Generalized weakness, PT evaluation  Code Status:Full  Family Communication:Patient's wife updated at bedside  Disposition Plan:Home with services  Discharge Diagnoses:  Principal Problem:   Sepsis due to urinary tract infection ([place]) Active Problems:   Normochromic normocytic anemia   Coronary artery disease involving coronary bypass graft of native heart without angina pectoris   Recurrent UTI   Bladder cancer ([place])   Generalized weakness   Acute kidney injury [facility])  Discharge Instructions     Medication List    TAKE these medications   acetaminophen 500 MG tablet Commonly known as:  TYLENOL Take 500-1,000 mg by mouth every 6 (six) hours as needed for mild pain.   aspirin 81 MG EC tablet Take 1 tablet (81 mg total) by mouth daily. Start taking on:  10/04/2016   ciprofloxacin 500 MG tablet Commonly known as:  CIPRO Take 1 tablet (500 mg total) by mouth 2 (two) times daily.   rosuvastatin 10 MG tablet Commonly known as:  CRESTOR Take 10 mg by mouth daily. pw   traMADol 50 MG tablet Commonly known as:  ULTRAM Take 1 tablet (50 mg total) by mouth every 6 (six) hours as needed for moderate pain.      Follow-up Information    Caren Macadam, MD. Schedule an appointment as  soon as possible for a visit in 1 week(s).   Specialty:  Urology Why:  Hospital Follow Up  Contact information: [place] [address] [phone]        [name], MD. Schedule an appointment as soon as possible for a visit in 2 week(s).   Specialty:  Cardiology Why:  Hospital Follow Up  Contact information: [address] [place]  [address] [place] .   Why:  [place], physical/occupational therapy, social worker,aide. Contact information: [place] [address] [phone]          Allergies  Allergen Reactions  . Gabapentin Other (See Comments)    Shaking  . Other Other (See Comments)    Antihistamines-unknown reaction    Procedures/Studies: Dg Chest 2 View  Result Date: 10/01/2016 CLINICAL DATA:  Weakness for 2 days. History of urinary tract infection. EXAM: CHEST  2 VIEW COMPARISON:  09/01/2016 FINDINGS: Prior median sternotomy. Moderate thoracic spondylosis. Midline trachea. Mild cardiomegaly. No pleural effusion or pneumothorax. Bibasilar volume loss with relatively similar linear opacities. No lobar consolidation. IMPRESSION: No acute cardiopulmonary disease. Similar bibasilar volume loss with probable scarring. Electronically Signed   By: Abigail Miyamoto [name]   On: 10/01/2016 16:53   Dg Op Swallowing Func-medicare/speech Path  Result Date: 09/26/2016 Objective Swallowing Evaluation: Type of Study: MBS-Modified Barium Swallow Study Patient Details Name: ASHAWN ORTEGON MRN: [id number] Date of Birth: 1931/07/10 Today's Date: 09/26/2016 Time: SLP Start Time (ACUTE ONLY): 1405-SLP Stop Time (ACUTE ONLY): 1445 SLP Time Calculation (min) (ACUTE ONLY): 40 min Past Medical History: Past Medical History: Diagnosis Date . Bladder cancer ([place]) 05/2015 . CAD (coronary artery disease)  . Hyperlipidemia  . Hypertension  . Kidney stones  . Prostate CA [facility])  Past Surgical History: Past Surgical History: Procedure Laterality Date . CARDIAC SURGERY   . CORONARY ARTERY BYPASS GRAFT   . PENILE PROSTHESIS IMPLANT   . PROSTATE SURGERY   HPI: pt is an 82 yo male with h/o bladder cancer, CAD, BPH with recent hospital admission for leukocytosis, decreased appetite and DOE 9/23-9/26.  Pt reports problems swallowing (wife states gradual onset over 3 months) with sensation  of liquids sticking in proximal esophagus with regurgitation.  He reports sensing food lodging in pharynx requiring him to expectorate - stating its only food not secretions that come back up.   Pt has had 30 pound weight loss in 1 year per wife and pt attributes this to dysphagia causing him not to eat.  He denies sensation of choking on food/drink and has not required heimlich manuever.  He required max assist to transfer from wheelchair to chair.    Subjective: pt awake in chair, wife Lissa Merlin came with pt Assessment / Plan / Recommendation CHL IP CLINICAL IMPRESSIONS 09/26/2016 Therapy Diagnosis Moderate oral phase dysphagia;Suspected primary esophageal dysphagia Clinical Impression Moderate oral and suspected primary esophageal dysphagia noted.   Delayed oral transiting noted with lingual tremor intermittently noted.  Premature spillage due to weakness and decreased bolus cohesion results in oral residuals covering posterior lingual musculature.  Mild vallecular residuals also noted after swallowing due to decreased tongue base retraction with intermittent awareness.  Cued liquid wash to clear solids and dry swallows to aid liquid clearance helpful    Laryngeal elevation and epiglottic deflection adequate with no aspiration/penetration.    Of note, pt continued to sense pharyngeal residuals - although his pharynx was  clear.  Suspect this was referent to esophageal stasis. Recommend follow up SLP to target oropharyngeal swallow and consideration for dedicated esophageal work up (see radiologist note).  Educated pt/wife to findings using live video, written xerostomia tips and teach back.  Thanks for this referral.    Impact on safety and function Moderate aspiration risk   CHL IP TREATMENT RECOMMENDATION 09/26/2016 Treatment Recommendations Defer treatment plan to f/u with SLP   No flowsheet data found. CHL IP DIET RECOMMENDATION 09/26/2016 SLP Diet Recommendations Dysphagia 3 (Mech soft) solids;Thin liquid Liquid  Administration via Cup;Straw Medication Administration Whole meds with liquid Compensations Minimize environmental distractions;Slow rate;Small sips/bites;Follow solids with liquid Postural Changes Remain semi-upright after after feeds/meals (Comment);Seated upright at 90 degrees   CHL IP OTHER RECOMMENDATIONS 09/26/2016 Recommended Consults Consider GI evaluation;Consider esophageal assessment Oral Care Recommendations Oral care BID Other Recommendations --   CHL IP FOLLOW UP RECOMMENDATIONS 09/26/2016 Follow up Recommendations Home health SLP   No flowsheet data found.     CHL IP ORAL PHASE 09/26/2016 Oral Phase Impaired Oral - Pudding Teaspoon -- Oral - Pudding Cup -- Oral - Honey Teaspoon -- Oral - Honey Cup -- Oral - Nectar Teaspoon -- Oral - Nectar Cup Weak lingual manipulation;Lingual pumping;Reduced posterior propulsion;Lingual/palatal residue;Piecemeal swallowing;Delayed oral transit;Premature spillage Oral - Nectar Straw -- Oral - Thin Teaspoon -- Oral - Thin Cup Weak lingual manipulation;Lingual pumping;Reduced posterior propulsion;Lingual/palatal residue;Piecemeal swallowing;Delayed oral transit;Decreased bolus cohesion;Premature spillage Oral - Thin Straw Weak lingual manipulation;Lingual pumping;Delayed oral transit;Decreased bolus cohesion;Premature spillage Oral - Puree Weak lingual manipulation;Lingual pumping;Reduced posterior propulsion;Lingual/palatal residue;Piecemeal swallowing;Delayed oral transit;Decreased bolus cohesion;Premature spillage Oral - Mech Soft -- Oral - Regular Impaired mastication;Weak lingual manipulation;Lingual pumping;Reduced posterior propulsion;Lingual/palatal residue;Piecemeal swallowing;Delayed oral transit;Decreased bolus cohesion;Premature spillage Oral - Multi-Consistency -- Oral - Pill Weak lingual manipulation;Lingual pumping;Lingual/palatal residue;Decreased bolus cohesion Oral Phase - Comment lingual base residuals noted (oral and pharyngeal)  CHL IP PHARYNGEAL  PHASE 09/26/2016 Pharyngeal Phase Impaired Pharyngeal- Pudding Teaspoon -- Pharyngeal -- Pharyngeal- Pudding Cup -- Pharyngeal -- Pharyngeal- Honey Teaspoon -- Pharyngeal -- Pharyngeal- Honey Cup -- Pharyngeal -- Pharyngeal- Nectar Teaspoon -- Pharyngeal -- Pharyngeal- Nectar Cup Reduced tongue base retraction;Pharyngeal residue - valleculae Pharyngeal -- Pharyngeal- Nectar Straw -- Pharyngeal -- Pharyngeal- Thin Teaspoon -- Pharyngeal -- Pharyngeal- Thin Cup Reduced tongue base retraction;Pharyngeal residue - valleculae Pharyngeal -- Pharyngeal- Thin Straw Reduced tongue base retraction;Pharyngeal residue - valleculae Pharyngeal -- Pharyngeal- Puree Reduced tongue base retraction;Pharyngeal residue - valleculae Pharyngeal -- Pharyngeal- Mechanical Soft -- Pharyngeal -- Pharyngeal- Regular Reduced tongue base retraction;Pharyngeal residue - valleculae Pharyngeal -- Pharyngeal- Multi-consistency -- Pharyngeal -- Pharyngeal- Pill NT Pharyngeal -- Pharyngeal Comment pt largely senses residuals in pharynx, liquid and dry swallows faciliate clearance  CHL IP CERVICAL ESOPHAGEAL PHASE 09/26/2016 Cervical Esophageal Phase WFL Pudding Teaspoon -- Pudding Cup -- Honey Teaspoon -- Honey Cup -- Nectar Teaspoon -- Nectar Cup -- Nectar Straw -- Thin Teaspoon -- Thin Cup -- Thin Straw -- Puree -- Mechanical Soft -- Regular -- Multi-consistency -- Pill -- Cervical Esophageal Comment -- CHL IP GO 09/26/2016 Functional Assessment Tool Used MBS, clinical judgement Functional Limitations Swallowing Swallow Current Status [id number]) CK Swallow Goal Status [id number]) CK Swallow Discharge Status [id number]) CK Janett Labella [place], [facility] SLP [phone]            CLINICAL DATA:  Feels like food is sticking. EXAM: MODIFIED BARIUM SWALLOW TECHNIQUE: Different consistencies of barium were administered orally to the patient by the Speech Pathologist. Imaging of the pharynx was  performed in the lateral projection. FLUOROSCOPY TIME:   Fluoroscopy Time:  2.1 minutes Radiation Exposure Index (if provided by the fluoroscopic device): 8.1 mGyair kerma Number of Acquired Spot Images: 0 COMPARISON:  None. FINDINGS: Swallowing was observed with consistencies ranging from thin to solid. There is persistent delay in AP transit with intermittent tremor of the tongue. Once in the pharynx there is good epiglottic turnover and airway protection. Occasional mild stasis that would clear with second swallow. No visualized diverticulum. Pill was provided but the patient could not deliver it to the pharynx using either pudding or liquid. After completion of the exam single lateral image of the esophagus was obtained showing diffuse stasis. IMPRESSION: 1. Oral dysphagia, please refer to the Speech Pathologists report for complete details and recommendations. 2. Esophageal stasis which could be from dysmotility or distal stricture. Esophagram could further evaluate if appropriate in this patient with bladder malignancy. Electronically Signed   By: Monte Fantasia [name]   On: 09/26/2016 14:58   Dg Esophagus  Result Date: 10/03/2016 CLINICAL DATA:  Dysphagia. feeling of food getting stuck in throat at mid esophagus. History of bladder cancer. Prostate cancer. Hypertension. Coronary artery disease. EXAM: ESOPHOGRAM/BARIUM SWALLOW TECHNIQUE: Single contrast examination was performed using  thin barium. FLUOROSCOPY TIME:  Fluoroscopy Time:  2 minutes and 18 seconds Radiation Exposure Index (if provided by the fluoroscopic device): 44.4 mGy Number of Acquired Spot Images: 0 COMPARISON:  Modified swallow of 09/26/2016. FINDINGS: Mildly limited exam secondary to patient immobility. Exam performed with patient in an LPO position. Evaluation of esophageal motility demonstrates an incomplete primary peristaltic wave with proximal escape and stasis in the mid and lower thoracic esophagus. Full column evaluation of the esophagus demonstrates no evidence of high-grade  stricture. An area of suboptimal distention at the gastroesophageal junction is best evaluated on image 123/series 21. IMPRESSION: 1. Mild esophageal dysmotility, likely presbyesophagus. 2. An area of suboptimal distention at the gastroesophageal junction for which a mild reflux induced stricture cannot be excluded. This is felt to not be the primary cause of patient's symptoms, given the extent of dysmotility. Of note, the patient could not attempt to swallow a 13 mm barium tablet, as he takes pills only broken up. Electronically Signed   By: Abigail Miyamoto [name]   On: 10/03/2016 16:17    Subjective: Pt says he is feeling better today.   Discharge Exam: Vitals:   10/03/16 0514 10/03/16 1424  BP: 129/73 (!) 121/92  Pulse: 72 80  Resp: 18 19  Temp: 98.4 F (36.9 C) 98.3 F (36.8 C)   Vitals:   10/02/16 1416 10/02/16 2016 10/03/16 0514 10/03/16 1424  BP: 105/61 109/64 129/73 (!) 121/92  Pulse: 80 82 72 80  Resp: 19 18 18 19   Temp: 98.4 F (36.9 C) 98.5 F (36.9 C) 98.4 F (36.9 C) 98.3 F (36.8 C)  TempSrc: Oral Oral Oral Oral  SpO2: 98% 100% 97% 100%  Weight:   116.5 kg (256 lb 13.4 oz)   Height:       General exam:  Adult male.  No acute distress.  HEENT:  NCAT, MMM Respiratory system: Clear to auscultation bilaterally Cardiovascular system: Regular rate and rhythm, normal S1/S2.  Warm extremities Gastrointestinal system: Normal active bowel sounds, soft, nondistended, TTP in the RLQ and suprapubic area without rebound or guarding.  Catheter bag with dark-colored and purulent urine  MSK:  Normal tone and bulk, no lower extremity edema Neuro:  Grossly intact  The results of significant diagnostics  from this hospitalization (including imaging, microbiology, ancillary and laboratory) are listed below for reference.     Microbiology: Recent Results (from the past 240 hour(s))  Blood Culture (routine x 2)     Status: None (Preliminary result)   Collection Time: 10/01/16  4:15 PM   Result Value Ref Range Status   Specimen Description BLOOD RIGHT ANTECUBITAL  Final   Special Requests BOTTLES DRAWN AEROBIC AND ANAEROBIC 5CC EACH  Final   Culture   Final    NO GROWTH 2 DAYS Performed at [facility]    Report Status PENDING  Incomplete  Blood Culture (routine x 2)     Status: None (Preliminary result)   Collection Time: 10/01/16  4:15 PM  Result Value Ref Range Status   Specimen Description BLOOD LEFT ANTECUBITAL  Final   Special Requests BOTTLES DRAWN AEROBIC AND ANAEROBIC 5CC  Final   Culture   Final    NO GROWTH 2 DAYS Performed at [facility]    Report Status PENDING  Incomplete  Urine culture     Status: Abnormal (Preliminary result)   Collection Time: 10/01/16  5:59 PM  Result Value Ref Range Status   Specimen Description URINE, CATHETERIZED  Final   Special Requests NORMAL  Final   Culture MULTIPLE SPECIES PRESENT, SUGGEST RECOLLECTION (A)  Final   Report Status PENDING  Incomplete     Labs: BNP (last 3 results) No results for input(s): BNP in the last 8760 hours. Basic Metabolic Panel:  Recent Labs Lab 10/01/16 1618 10/02/16 0453 10/03/16 0530  NA 139 141 140  K 4.6 4.4 4.7  CL 110 117* 115*  CO2 21* 17* 19*  GLUCOSE 125* 113* 95  BUN 36* 31* 24*  CREATININE 2.43* 2.02* 1.74*  CALCIUM 9.8 8.6* 8.8*   Liver Function Tests:  Recent Labs Lab 10/01/16 1618  AST 55*  ALT 35  ALKPHOS 62  BILITOT 0.4  PROT 8.1  ALBUMIN 2.7*   No results for input(s): LIPASE, AMYLASE in the last 168 hours. No results for input(s): AMMONIA in the last 168 hours. CBC:  Recent Labs Lab 10/01/16 1618 10/02/16 0453 10/03/16 0530  WBC 20.0* 16.9* 11.1*  NEUTROABS 15.3*  --   --   HGB 10.4* 9.0* 9.3*  HCT 31.3* 28.6* 28.4*  MCV 83.7 86.4 84.0  PLT 423* 354 PLATELET CLUMPS NOTED ON SMEAR, COUNT APPEARS ADEQUATE   Cardiac Enzymes:  Recent Labs Lab 10/01/16 1618 10/01/16 2237 10/02/16 0453  TROPONINI 0.04* 0.04* 0.03*    BNP: Invalid input(s): POCBNP CBG:  Recent Labs Lab 10/02/16 0757 10/03/16 0742  GLUCAP 107* 94   D-Dimer No results for input(s): DDIMER in the last 72 hours. Hgb A1c No results for input(s): HGBA1C in the last 72 hours. Lipid Profile No results for input(s): CHOL, HDL, LDLCALC, TRIG, CHOLHDL, LDLDIRECT in the last 72 hours. Thyroid function studies  Recent Labs  10/03/16 0642  TSH 1.762   Anemia work up  Recent Labs  10/03/16 0642  VITAMINB12 1,023*  FOLATE 10.1  FERRITIN 645*  TIBC 158*  IRON 22*   Urinalysis    Component Value Date/Time   COLORURINE AMBER (A) 10/01/2016 1759   APPEARANCEUR TURBID (A) 10/01/2016 1759   LABSPEC 1.022 10/01/2016 1759   PHURINE 6.5 10/01/2016 1759   GLUCOSEU NEGATIVE 10/01/2016 1759   HGBUR LARGE (A) 10/01/2016 1759   BILIRUBINUR SMALL (A) 10/01/2016 1759   KETONESUR NEGATIVE 10/01/2016 1759   PROTEINUR >300 (A) 10/01/2016 1759  UROBILINOGEN 0.2 09/16/2015 1741   NITRITE NEGATIVE 10/01/2016 1759   LEUKOCYTESUR LARGE (A) 10/01/2016 1759   Sepsis Labs Invalid input(s): PROCALCITONIN,  WBC,  LACTICIDVEN Microbiology Recent Results (from the past 240 hour(s))  Blood Culture (routine x 2)     Status: None (Preliminary result)   Collection Time: 10/01/16  4:15 PM  Result Value Ref Range Status   Specimen Description BLOOD RIGHT ANTECUBITAL  Final   Special Requests BOTTLES DRAWN AEROBIC AND ANAEROBIC 5CC EACH  Final   Culture   Final    NO GROWTH 2 DAYS Performed at [facility]    Report Status PENDING  Incomplete  Blood Culture (routine x 2)     Status: None (Preliminary result)   Collection Time: 10/01/16  4:15 PM  Result Value Ref Range Status   Specimen Description BLOOD LEFT ANTECUBITAL  Final   Special Requests BOTTLES DRAWN AEROBIC AND ANAEROBIC 5CC  Final   Culture   Final    NO GROWTH 2 DAYS Performed at [facility]    Report Status PENDING  Incomplete  Urine culture     Status:  Abnormal (Preliminary result)   Collection Time: 10/01/16  5:59 PM  Result Value Ref Range Status   Specimen Description URINE, CATHETERIZED  Final   Special Requests NORMAL  Final   Culture MULTIPLE SPECIES PRESENT, SUGGEST RECOLLECTION (A)  Final   Report Status PENDING  Incomplete   Time coordinating discharge: 32 minutes  SIGNED:  Irwin Brakeman, MD  [facility] 10/03/2016, 5:10 PM Pager   If 7PM-7AM, please contact night-coverage [email or web address] Password [email or web address]

## 2016-10-03 NOTE — Care Management Note (Signed)
Case Management Note  Patient Details  Name: [name] MRN: [id number] Date of Birth: December 20, 1930  Subjective/Objective:  [facility] active-has [facility] orders, aware of d/c today-rep Manuela Schwartz. No further CM needs.                  Action/Plan:d/c home w/HHC.   Expected Discharge Date:                  Expected Discharge Plan:  [place]  In-House Referral:     Discharge planning Services     Post Acute Care Choice:  Home Health (Active w/[facility] HHRN/PT/aide/sw) Choice offered to:  Patient  DME Arranged:    DME Agency:     HH Arranged:  RN, PT, OT, Nurse's Aide, Social Work [employer] Agency:  [facility]  Status of Service:  Completed, signed off  If discussed at [employer], dates discussed:    Additional Comments:  Dessa Phi, RN 10/03/2016, 10:45 AM

## 2016-10-03 NOTE — Evaluation (Signed)
SLP Cancellation Note  Patient Details Name: [name] MRN: [id number] DOB: 1931/05/11   Cancelled treatment:       Reason Eval/Treat Not Completed: Other (comment) (pt scheduled for esophagram today at 1030 am at [facility]; called RN and MD re: recommendation for esophagram and informed xray of possible order)   Luanna Salk, [facility] SLP [phone]

## 2016-10-04 LAB — URINE CULTURE: SPECIAL REQUESTS: NORMAL

## 2016-10-05 NOTE — Progress Notes (Signed)
ATI ruled in, improved with hydration  Murvin Natal, MD

## 2016-10-06 LAB — CULTURE, BLOOD (ROUTINE X 2)
CULTURE: NO GROWTH
CULTURE: NO GROWTH

## 2016-10-08 ENCOUNTER — Telehealth: Payer: Self-pay | Admitting: Physical Medicine & Rehabilitation

## 2016-10-08 NOTE — Telephone Encounter (Signed)
Nurse with [facility] home [facility] called and left a message stating that she will call back with information. Went to see patient on 10/29 for PRN visit Patient was released from the hospital with a UTI and sepsis - pt is refusing to take antibiotics and tramadol even thought patient is having pain in his right flank.  Patient did receive fluids in the hospital and nurse feels that they may have over hydrated him due to crackels in all four quadrent of lungs - patient is a CHF patient and had a 99.6 temp.  Pt was informed to call 911 if pt had SOB or any CP.   Nurse did not leave any contact information on the message however did state that she would call back again on Monday.

## 2016-10-16 ENCOUNTER — Encounter: Payer: Medicare Other | Admitting: Nurse Practitioner

## 2016-10-16 ENCOUNTER — Encounter: Payer: Self-pay | Admitting: Nurse Practitioner

## 2016-10-16 NOTE — Progress Notes (Signed)
This encounter was created in error - please disregard.

## 2016-12-08 ENCOUNTER — Encounter (HOSPITAL_COMMUNITY): Payer: Self-pay | Admitting: Emergency Medicine

## 2016-12-08 ENCOUNTER — Inpatient Hospital Stay (HOSPITAL_COMMUNITY)
Admission: EM | Admit: 2016-12-08 | Discharge: 2016-12-11 | DRG: 684 | Disposition: A | Discharge status: Home / Self Care | Payer: Medicare Other | Attending: Internal Medicine | Admitting: Internal Medicine

## 2016-12-08 ENCOUNTER — Emergency Department (HOSPITAL_COMMUNITY): Payer: Medicare Other

## 2016-12-08 DIAGNOSIS — Z801 Family history of malignant neoplasm of trachea, bronchus and lung: Secondary | ICD-10-CM

## 2016-12-08 DIAGNOSIS — J069 Acute upper respiratory infection, unspecified: Secondary | ICD-10-CM | POA: Diagnosis not present

## 2016-12-08 DIAGNOSIS — Z833 Family history of diabetes mellitus: Secondary | ICD-10-CM

## 2016-12-08 DIAGNOSIS — Z8551 Personal history of malignant neoplasm of bladder: Secondary | ICD-10-CM

## 2016-12-08 DIAGNOSIS — Z8546 Personal history of malignant neoplasm of prostate: Secondary | ICD-10-CM

## 2016-12-08 DIAGNOSIS — D649 Anemia, unspecified: Secondary | ICD-10-CM | POA: Diagnosis present

## 2016-12-08 DIAGNOSIS — J111 Influenza due to unidentified influenza virus with other respiratory manifestations: Secondary | ICD-10-CM | POA: Diagnosis present

## 2016-12-08 DIAGNOSIS — Z951 Presence of aortocoronary bypass graft: Secondary | ICD-10-CM

## 2016-12-08 DIAGNOSIS — Z87442 Personal history of urinary calculi: Secondary | ICD-10-CM

## 2016-12-08 DIAGNOSIS — Z841 Family history of disorders of kidney and ureter: Secondary | ICD-10-CM

## 2016-12-08 DIAGNOSIS — C679 Malignant neoplasm of bladder, unspecified: Secondary | ICD-10-CM | POA: Diagnosis present

## 2016-12-08 DIAGNOSIS — N179 Acute kidney failure, unspecified: Secondary | ICD-10-CM | POA: Diagnosis present

## 2016-12-08 DIAGNOSIS — R531 Weakness: Secondary | ICD-10-CM

## 2016-12-08 DIAGNOSIS — Z8249 Family history of ischemic heart disease and other diseases of the circulatory system: Secondary | ICD-10-CM

## 2016-12-08 DIAGNOSIS — I10 Essential (primary) hypertension: Secondary | ICD-10-CM | POA: Diagnosis present

## 2016-12-08 DIAGNOSIS — I2510 Atherosclerotic heart disease of native coronary artery without angina pectoris: Secondary | ICD-10-CM | POA: Diagnosis present

## 2016-12-08 DIAGNOSIS — Z87440 Personal history of urinary (tract) infections: Secondary | ICD-10-CM

## 2016-12-08 DIAGNOSIS — I129 Hypertensive chronic kidney disease with stage 1 through stage 4 chronic kidney disease, or unspecified chronic kidney disease: Secondary | ICD-10-CM | POA: Diagnosis present

## 2016-12-08 DIAGNOSIS — E860 Dehydration: Secondary | ICD-10-CM | POA: Diagnosis present

## 2016-12-08 DIAGNOSIS — N183 Chronic kidney disease, stage 3 (moderate): Secondary | ICD-10-CM | POA: Diagnosis present

## 2016-12-08 DIAGNOSIS — R69 Illness, unspecified: Secondary | ICD-10-CM

## 2016-12-08 DIAGNOSIS — Z87891 Personal history of nicotine dependence: Secondary | ICD-10-CM

## 2016-12-08 DIAGNOSIS — E875 Hyperkalemia: Secondary | ICD-10-CM | POA: Diagnosis present

## 2016-12-08 DIAGNOSIS — R6889 Other general symptoms and signs: Secondary | ICD-10-CM | POA: Diagnosis present

## 2016-12-08 DIAGNOSIS — N182 Chronic kidney disease, stage 2 (mild): Secondary | ICD-10-CM | POA: Diagnosis present

## 2016-12-08 LAB — COMPREHENSIVE METABOLIC PANEL
ALBUMIN: 2.7 g/dL — AB (ref 3.5–5.0)
ALK PHOS: 48 U/L (ref 38–126)
ALT: 10 U/L — ABNORMAL LOW (ref 17–63)
ANION GAP: 9 (ref 5–15)
AST: 18 U/L (ref 15–41)
BILIRUBIN TOTAL: 0.5 mg/dL (ref 0.3–1.2)
BUN: 22 mg/dL — ABNORMAL HIGH (ref 6–20)
CALCIUM: 9.7 mg/dL (ref 8.9–10.3)
CO2: 19 mmol/L — ABNORMAL LOW (ref 22–32)
Chloride: 109 mmol/L (ref 101–111)
Creatinine, Ser: 2.07 mg/dL — ABNORMAL HIGH (ref 0.61–1.24)
GFR calc Af Amer: 32 mL/min — ABNORMAL LOW (ref >60)
GFR, EST NON AFRICAN AMERICAN: 28 mL/min — AB (ref >60)
GLUCOSE: 95 mg/dL (ref 65–99)
Potassium: 5.2 mmol/L — ABNORMAL HIGH (ref 3.5–5.1)
Sodium: 137 mmol/L (ref 135–145)
TOTAL PROTEIN: 7.6 g/dL (ref 6.5–8.1)

## 2016-12-08 LAB — CBC WITH DIFFERENTIAL/PLATELET
BASOS ABS: 0 10*3/uL (ref 0.0–0.1)
BASOS PCT: 0 %
EOS ABS: 0.3 10*3/uL (ref 0.0–0.7)
Eosinophils Relative: 3 %
HCT: 30.8 % — ABNORMAL LOW (ref 39.0–52.0)
HEMOGLOBIN: 9.9 g/dL — AB (ref 13.0–17.0)
Lymphocytes Relative: 22 %
Lymphs Abs: 2.4 10*3/uL (ref 0.7–4.0)
MCH: 27 pg (ref 26.0–34.0)
MCHC: 32.1 g/dL (ref 30.0–36.0)
MCV: 83.9 fL (ref 78.0–100.0)
MONO ABS: 1.4 10*3/uL — AB (ref 0.1–1.0)
MONOS PCT: 13 %
NEUTROS PCT: 62 %
Neutro Abs: 6.6 10*3/uL (ref 1.7–7.7)
Platelets: 350 10*3/uL (ref 150–400)
RBC: 3.67 MIL/uL — ABNORMAL LOW (ref 4.22–5.81)
RDW: 17.6 % — AB (ref 11.5–15.5)
WBC: 10.7 10*3/uL — ABNORMAL HIGH (ref 4.0–10.5)

## 2016-12-08 LAB — I-STAT CG4 LACTIC ACID, ED
LACTIC ACID, VENOUS: 1.9 mmol/L (ref 0.5–1.9)
LACTIC ACID, VENOUS: 1.58 mmol/L (ref 0.5–1.9)

## 2016-12-08 MED ORDER — HEPARIN SODIUM (PORCINE) 5000 UNIT/ML IJ SOLN
5000.0000 [IU] | Freq: Three times a day (TID) | INTRAMUSCULAR | Status: DC
  Administered 2016-12-09 – 2016-12-11 (×5): 5000 [IU] via SUBCUTANEOUS
  Filled 2016-12-08 (×4): qty 1

## 2016-12-08 MED ORDER — SODIUM CHLORIDE 0.9% FLUSH
3.0000 mL | Freq: Two times a day (BID) | INTRAVENOUS | Status: DC
  Administered 2016-12-09: 3 mL via INTRAVENOUS

## 2016-12-08 MED ORDER — ASPIRIN EC 81 MG PO TBEC
81.0000 mg | DELAYED_RELEASE_TABLET | Freq: Every day | ORAL | Status: DC
  Administered 2016-12-09 – 2016-12-10 (×2): 81 mg via ORAL
  Filled 2016-12-08 (×2): qty 1

## 2016-12-08 MED ORDER — SODIUM CHLORIDE 0.9 % IV SOLN: INTRAVENOUS | Status: DC

## 2016-12-08 MED ORDER — SODIUM CHLORIDE 0.9 % IV BOLUS (SEPSIS)
1000.0000 mL | Freq: Once | INTRAVENOUS | Status: AC
  Administered 2016-12-08: 1000 mL via INTRAVENOUS

## 2016-12-08 MED ORDER — SODIUM CHLORIDE 0.9 % IV SOLN
INTRAVENOUS | Status: AC
  Administered 2016-12-09: via INTRAVENOUS

## 2016-12-08 MED ORDER — TRAMADOL HCL 50 MG PO TABS: 50.0000 mg | ORAL_TABLET | Freq: Four times a day (QID) | ORAL | Status: DC | PRN

## 2016-12-08 MED ORDER — ROSUVASTATIN CALCIUM 10 MG PO TABS: 10.0000 mg | ORAL_TABLET | Freq: Every day | ORAL | Status: DC

## 2016-12-08 MED ORDER — ONDANSETRON HCL 4 MG/2ML IJ SOLN
4.0000 mg | Freq: Four times a day (QID) | INTRAMUSCULAR | Status: DC | PRN
Start: 2016-12-08 — End: 2016-12-11

## 2016-12-08 MED ORDER — ACETAMINOPHEN 325 MG PO TABS: 650.0000 mg | ORAL_TABLET | Freq: Four times a day (QID) | ORAL | Status: DC | PRN

## 2016-12-08 MED ORDER — ONDANSETRON HCL 4 MG PO TABS: 4.0000 mg | ORAL_TABLET | Freq: Four times a day (QID) | ORAL | Status: DC | PRN

## 2016-12-08 NOTE — ED Provider Notes (Addendum)
[facility] DEPT Provider Note   CSN: [id number] Arrival date & time: 12/08/16  1715     History   Chief Complaint Chief Complaint  Patient presents with  . Fever  . flu like symptoms  . Diarrhea    HPI [name] is a 82 y.o. male.  HPI plans of cough for the past 3 weeks, progressively worsening company by generalized weakness. He also reports diarrhea for the past 2 or 3 days. He had 2 episodes of diarrhea today. He denies abdominal pain. Maximum temperature 100.1. No other associated symptom  Past Medical History:  Diagnosis Date  . Bladder cancer ([place]) 05/2015  . CAD (coronary artery disease)   . Hyperlipidemia   . Hypertension   . Kidney stones   . Prostate CA [facility])     Patient Active Problem List   Diagnosis Date Noted  . Sepsis due to urinary tract infection ([place]) 10/01/2016  . Generalized weakness   . Acute kidney injury ([place])   . UTI (lower urinary tract infection) 09/01/2016  . Bladder cancer ([place]) 09/01/2016  . Neuropathic pain   . Contact dermatitis   . AKI (acute kidney injury) ([place])   . Recurrent UTI   . Leukocytosis   . Absence of bladder continence   . Pain   . Hydrocele in adult   . Acute cystitis with hematuria   . Acute blood loss anemia   . Anemia of chronic disease   . Other secondary hypertension   . Acute idiopathic gout of left knee   . Urinary retention   . Debilitated 08/02/2016  . Bilateral hydronephrosis   . Normochromic normocytic anemia   . E-coli UTI   . Benign essential HTN   . Coronary artery disease involving coronary bypass graft of native heart without angina pectoris   . BPH (benign prostatic hyperplasia)   . Kidney stones   . CKD (chronic kidney disease)   . Morbid obesity due to excess calories ([place])   . Acute renal failure (ARF) ([place]) 07/29/2016    Past Surgical History:  Procedure Laterality Date  . CARDIAC SURGERY    . CORONARY ARTERY BYPASS GRAFT    . PENILE PROSTHESIS IMPLANT    . PROSTATE  SURGERY         Home Medications    Prior to Admission medications   Medication Sig Start Date End Date Taking? Authorizing Provider  acetaminophen (TYLENOL) 500 MG tablet Take 500-1,000 mg by mouth every 6 (six) hours as needed for mild pain.     Historical Provider, MD  aspirin EC 81 MG EC tablet Take 1 tablet (81 mg total) by mouth daily. 10/04/16   Clanford Marisa Hua, MD  rosuvastatin (CRESTOR) 10 MG tablet Take 10 mg by mouth daily. pw 08/11/16   Historical Provider, MD  traMADol (ULTRAM) 50 MG tablet Take 1 tablet (50 mg total) by mouth every 6 (six) hours as needed for moderate pain. 08/16/16   Lavon Paganini Angiulli, PA-C    Family History Family History  Problem Relation Age of Onset  . Congestive Heart Failure Mother   . Kidney failure Sister   . Hypertension Sister   . Diabetes Mellitus II Brother   . CAD Brother   . Lung cancer Brother   . Colon cancer Neg Hx   . Esophageal cancer Neg Hx     Social History Social History  Substance Use Topics  . Smoking status: Former Research scientist (life sciences)  . Smokeless tobacco: Never Used  .  Alcohol use No     Allergies   Gabapentin and Other   Review of Systems Review of Systems  Constitutional: Positive for appetite change.       Diminished appetite  Respiratory: Positive for cough. Negative for shortness of breath.   Genitourinary: Positive for dysuria.       Chronically incontinent of urine  Musculoskeletal: Positive for gait problem.       Walks with walker  Allergic/Immunologic: Positive for immunocompromised state.       Cancer patient  Neurological: Positive for weakness.       Generalized weakness     Physical Exam Updated Vital Signs BP 98/78 (BP Location: Right Arm)   Pulse 84   Temp 100.2 F (37.9 C) (Rectal)   Resp 18   SpO2 95%   Physical Exam  Constitutional:  Chronically ill appearing  HENT:  Head: Normocephalic and atraumatic.  Mucus membranes dry  Eyes: Conjunctivae are normal. Pupils are equal, round,  and reactive to light.  Neck: Neck supple. No tracheal deviation present. No thyromegaly present.  Cardiovascular: Normal rate and regular rhythm.   No murmur heard. Pulmonary/Chest: Effort normal and breath sounds normal.  Abdominal: Soft. Bowel sounds are normal. He exhibits no distension. There is no tenderness.  Musculoskeletal: Normal range of motion. He exhibits no edema or tenderness.  Neurological: He is alert. Coordination normal.  Skin: Skin is warm and dry. No rash noted.  Psychiatric: He has a normal mood and affect.  Nursing note and vitals reviewed.    ED Treatments / Results  Labs (all labs ordered are listed, but only abnormal results are displayed) Labs Reviewed  COMPREHENSIVE METABOLIC PANEL - Abnormal; Notable for the following:       Result Value   Potassium 5.2 (*)    CO2 19 (*)    BUN 22 (*)    Creatinine, Ser 2.07 (*)    Albumin 2.7 (*)    ALT 10 (*)    GFR calc non Af Amer 28 (*)    GFR calc Af Amer 32 (*)    All other components within normal limits  CBC WITH DIFFERENTIAL/PLATELET - Abnormal; Notable for the following:    WBC 10.7 (*)    RBC 3.67 (*)    Hemoglobin 9.9 (*)    HCT 30.8 (*)    RDW 17.6 (*)    Monocytes Absolute 1.4 (*)    All other components within normal limits  CULTURE, BLOOD (ROUTINE X 2)  CULTURE, BLOOD (ROUTINE X 2)  URINE CULTURE  URINALYSIS, ROUTINE W REFLEX MICROSCOPIC  I-STAT CG4 LACTIC ACID, ED  I-STAT CG4 LACTIC ACID, ED    EKG  EKG Interpretation  Date/Time:  Sunday December 09 2016 06:18:40 EST Ventricular Rate:  81 PR Interval:    QRS Duration: 129 QT Interval:  411 QTC Calculation: 478 R Axis:   -50 Text Interpretation:  Sinus rhythm RBBB and LAFB Baseline wander in lead(s) V2 No significant change since last tracing Confirmed by Winfred Leeds  MD, [place] Kappes [phone]) on 12/12/2016 10:26:26 AM       Radiology Dg Chest 2 View  Result Date: 12/08/2016 CLINICAL DATA:  Patient with cough for 2-3 weeks. Nausea.  Low-grade fever. EXAM: CHEST  2 VIEW COMPARISON:  Chest radiograph 10/01/2016. FINDINGS: Stable cardiac and mediastinal contours status post median sternotomy. Mild tortuosity of the thoracic aorta. Patchy consolidation within the lung bases bilaterally, right-greater-than-left. Mid thoracic spine degenerative changes. IMPRESSION: Patchy consolidation within the lung bases bilaterally  most suggestive of scarring. No definite superimposed acute process. Electronically Signed   By: Lovey Newcomer [name]   On: 12/08/2016 18:54    Procedures Procedures (including critical care time) Chest x-ray viewed by me Results for orders placed or performed during the hospital encounter of 12/08/16  Comprehensive metabolic panel  Result Value Ref Range   Sodium 137 135 - 145 mmol/L   Potassium 5.2 (H) 3.5 - 5.1 mmol/L   Chloride 109 101 - 111 mmol/L   CO2 19 (L) 22 - 32 mmol/L   Glucose, Bld 95 65 - 99 mg/dL   BUN 22 (H) 6 - 20 mg/dL   Creatinine, Ser 2.07 (H) 0.61 - 1.24 mg/dL   Calcium 9.7 8.9 - 10.3 mg/dL   Total Protein 7.6 6.5 - 8.1 g/dL   Albumin 2.7 (L) 3.5 - 5.0 g/dL   AST 18 15 - 41 U/L   ALT 10 (L) 17 - 63 U/L   Alkaline Phosphatase 48 38 - 126 U/L   Total Bilirubin 0.5 0.3 - 1.2 mg/dL   GFR calc non Af Amer 28 (L) >60 mL/min   GFR calc Af Amer 32 (L) >60 mL/min   Anion gap 9 5 - 15  CBC with Differential  Result Value Ref Range   WBC 10.7 (H) 4.0 - 10.5 K/uL   RBC 3.67 (L) 4.22 - 5.81 MIL/uL   Hemoglobin 9.9 (L) 13.0 - 17.0 g/dL   HCT 30.8 (L) 39.0 - 52.0 %   MCV 83.9 78.0 - 100.0 fL   MCH 27.0 26.0 - 34.0 pg   MCHC 32.1 30.0 - 36.0 g/dL   RDW 17.6 (H) 11.5 - 15.5 %   Platelets 350 150 - 400 K/uL   Neutrophils Relative % 62 %   Neutro Abs 6.6 1.7 - 7.7 K/uL   Lymphocytes Relative 22 %   Lymphs Abs 2.4 0.7 - 4.0 K/uL   Monocytes Relative 13 %   Monocytes Absolute 1.4 (H) 0.1 - 1.0 K/uL   Eosinophils Relative 3 %   Eosinophils Absolute 0.3 0.0 - 0.7 K/uL   Basophils Relative 0 %    Basophils Absolute 0.0 0.0 - 0.1 K/uL  I-Stat CG4 Lactic Acid, ED  Result Value Ref Range   Lactic Acid, Venous 1.90 0.5 - 1.9 mmol/L   Dg Chest 2 View  Result Date: 12/08/2016 CLINICAL DATA:  Patient with cough for 2-3 weeks. Nausea. Low-grade fever. EXAM: CHEST  2 VIEW COMPARISON:  Chest radiograph 10/01/2016. FINDINGS: Stable cardiac and mediastinal contours status post median sternotomy. Mild tortuosity of the thoracic aorta. Patchy consolidation within the lung bases bilaterally, right-greater-than-left. Mid thoracic spine degenerative changes. IMPRESSION: Patchy consolidation within the lung bases bilaterally most suggestive of scarring. No definite superimposed acute process. Electronically Signed   By: Lovey Newcomer [name]   On: 12/08/2016 18:54   Medications Ordered in ED Medications  sodium chloride 0.9 % bolus 1,000 mL (not administered)     Initial Impression / Assessment and Plan / ED Course  I have reviewed the triage vital signs and the nursing notes.  Pertinent labs & imaging results that were available during my care of the patient were reviewed by me and considered in my medical decision making (see chart for details).  Clinical Course     Patient is clinically dehydrated with likely viral illness. Dr.Opyd consulted plan 23 hour observation telemetry requested as patient has mild hyperkalemia. Anemia is chronic. He has acute on chronic kidney injury . He'll be  treated with intravenous hydration Final Clinical Impressions(s) / ED Diagnoses  Diagnosis #1 influenza illness #2 dehydration 3 hyperkalemia #4 acute on chronic kidney injury Final diagnoses:  None    New Prescriptions New Prescriptions   No medications on file     Orlie Dakin, MD 12/08/16 2247    Orlie Dakin, MD 12/08/16 [id number]    Orlie Dakin, MD 12/31/16 [id number]

## 2016-12-08 NOTE — ED Triage Notes (Signed)
Pt has flu like symptoms that started 1 week ago, gradually getting worse. -- has cough that is non productive, fever, taking OTC meds without relief.

## 2016-12-08 NOTE — H&P (Signed)
History and Physical    MARSELINO ESCRIBANO [id number] DOB: 01-Mar-1931 DOA: 12/08/2016  PCP: [name], MD   Patient coming from: Home  Chief Complaint: Fever, cough, weakness, malaise  HPI: BEREN LINNEMANN is a 82 y.o. male with medical history significant for coronary artery disease status post remote CABG, bladder cancer status post multiple TURBT, and recurrent UTIs who presents to the emergency department with 1 week of progressive cough, malaise, generalized weakness, and fevers. Patient reports that he had been in his usual state of health until approximately one week ago when he developed a nonproductive cough and general malaise. He soon developed fevers as well and also began to note copious rhinorrhea. Over the past several days, he has continued to spike fevers at home and has also developed watery diarrhea. Patient reports being too weak to get out of bed for the past 2 days and his appetite is been very poor. He denies any chest pain or palpitations, denies significant abdominal pain or flank pain, denies dysuria, and denies lower extremity swelling, sick contacts, or long distance travel. He has been taking some over-the-counter flu remedies without any relief.    ED Course: Upon arrival to the ED, patient is found to have temp of 37.9 C, is saturating adequately on room air, mildly tachycardic, and with soft but stable blood pressure. Chemistry panels notable for a potassium of 5.2, BUN of 22, and serum creatinine of 2.07, up from an apparent baseline of low-to-mid 1-range. CBC is notable for a stable normocytic anemia with hemoglobin of 9.9 and a mild leukocytosis with WBC 10,700. Lactic acid is reassuring at 1.90. Chest x-ray is notable for patchy consolidation in the bilateral bases suggestive of scarring. Patient was given 1 L normal saline and blood and urine cultures were obtained. Flu PCR and urinalysis have been sent off and remain pending. Tachycardia and blood pressure  improved with fluids, patient remains hemodynamically stable, and he will be observed on the telemetry unit for ongoing evaluation and management of a flulike illness with dehydration, acute kidney injury, and hyperkalemia.  Review of Systems:  All other systems reviewed and apart from HPI, are negative.  Past Medical History:  Diagnosis Date  . Bladder cancer ([place]) 05/2015  . CAD (coronary artery disease)   . Hyperlipidemia   . Hypertension   . Kidney stones   . Prostate CA [facility])     Past Surgical History:  Procedure Laterality Date  . CARDIAC SURGERY    . CORONARY ARTERY BYPASS GRAFT    . PENILE PROSTHESIS IMPLANT    . PROSTATE SURGERY       reports that he has quit smoking. He has never used smokeless tobacco. He reports that he does not drink alcohol or use drugs.  Allergies  Allergen Reactions  . Gabapentin Other (See Comments)    Shaking  . Other Other (See Comments)    Antihistamines-unknown reaction    Family History  Problem Relation Age of Onset  . Congestive Heart Failure Mother   . Kidney failure Sister   . Hypertension Sister   . Diabetes Mellitus II Brother   . CAD Brother   . Lung cancer Brother   . Colon cancer Neg Hx   . Esophageal cancer Neg Hx      Prior to Admission medications   Medication Sig Start Date End Date Taking? Authorizing Provider  acetaminophen (TYLENOL) 500 MG tablet Take 500-1,000 mg by mouth every 6 (six) hours as needed for mild  pain.     Historical Provider, MD  aspirin EC 81 MG EC tablet Take 1 tablet (81 mg total) by mouth daily. 10/04/16   Clanford Marisa Hua, MD  rosuvastatin (CRESTOR) 10 MG tablet Take 10 mg by mouth daily. pw 08/11/16   Historical Provider, MD  traMADol (ULTRAM) 50 MG tablet Take 1 tablet (50 mg total) by mouth every 6 (six) hours as needed for moderate pain. 08/16/16   Cathlyn Parsons, PA-C    Physical Exam: Vitals:   12/08/16 2100 12/08/16 2126 12/08/16 2145 12/08/16 2200  BP: 108/79 98/78 111/74  116/79  Pulse: 84  88 85  Resp:      Temp:  100.2 F (37.9 C)    TempSrc:  Rectal    SpO2: 95%  96% 96%      Constitutional: Chronically-ill appearing, no acute respiratory distress, appears uncomfortable Eyes: PERTLA, lids and conjunctivae normal ENMT: Mucous membranes are moist. Posterior pharynx clear of any exudate or lesions.   Neck: normal, supple, no masses, no thyromegaly Respiratory: clear to auscultation bilaterally, no wheezing, no crackles. Normal respiratory effort. No accessory muscle use.  Cardiovascular: Rate ~100 and regular with hyperdynamic precordium. No extremity edema. No significant JVD. Abdomen: No distension, no tenderness, no masses palpated. Bowel sounds normal.  Musculoskeletal: no clubbing / cyanosis. No joint deformity upper and lower extremities.    Skin: no significant rashes, lesions, ulcers. Warm, dry, well-perfused. Neurologic: CN 2-12 grossly intact. Sensation intact, DTR normal. Strength 5/5 in all 4 limbs.  Psychiatric: Normal judgment and insight. Alert and oriented x 3. Normal mood and affect.     Labs on Admission: I have personally reviewed following labs and imaging studies  CBC:  Recent Labs Lab 12/08/16 1754  WBC 10.7*  NEUTROABS 6.6  HGB 9.9*  HCT 30.8*  MCV 83.9  PLT [id number]   Basic Metabolic Panel:  Recent Labs Lab 12/08/16 1754  NA 137  K 5.2*  CL 109  CO2 19*  GLUCOSE 95  BUN 22*  CREATININE 2.07*  CALCIUM 9.7   GFR: CrCl cannot be calculated (Unknown ideal weight.). Liver Function Tests:  Recent Labs Lab 12/08/16 1754  AST 18  ALT 10*  ALKPHOS 48  BILITOT 0.5  PROT 7.6  ALBUMIN 2.7*   No results for input(s): LIPASE, AMYLASE in the last 168 hours. No results for input(s): AMMONIA in the last 168 hours. Coagulation Profile: No results for input(s): INR, PROTIME in the last 168 hours. Cardiac Enzymes: No results for input(s): CKTOTAL, CKMB, CKMBINDEX, TROPONINI in the last 168 hours. BNP (last 3  results) No results for input(s): PROBNP in the last 8760 hours. HbA1C: No results for input(s): HGBA1C in the last 72 hours. CBG: No results for input(s): GLUCAP in the last 168 hours. Lipid Profile: No results for input(s): CHOL, HDL, LDLCALC, TRIG, CHOLHDL, LDLDIRECT in the last 72 hours. Thyroid Function Tests: No results for input(s): TSH, T4TOTAL, FREET4, T3FREE, THYROIDAB in the last 72 hours. Anemia Panel: No results for input(s): VITAMINB12, FOLATE, FERRITIN, TIBC, IRON, RETICCTPCT in the last 72 hours. Urine analysis:    Component Value Date/Time   COLORURINE AMBER (A) 10/01/2016 1759   APPEARANCEUR TURBID (A) 10/01/2016 1759   LABSPEC 1.022 10/01/2016 1759   PHURINE 6.5 10/01/2016 1759   GLUCOSEU NEGATIVE 10/01/2016 1759   HGBUR LARGE (A) 10/01/2016 1759   BILIRUBINUR SMALL (A) 10/01/2016 1759   KETONESUR NEGATIVE 10/01/2016 1759   PROTEINUR >300 (A) 10/01/2016 1759   UROBILINOGEN 0.2 09/16/2015  [address] 10/01/2016 1759   LEUKOCYTESUR LARGE (A) 10/01/2016 1759   Sepsis Labs: @LABRCNTIP (procalcitonin:4,lacticidven:4) )No results found for this or any previous visit (from the past 240 hour(s)).   Radiological Exams on Admission: Dg Chest 2 View  Result Date: 12/08/2016 CLINICAL DATA:  Patient with cough for 2-3 weeks. Nausea. Low-grade fever. EXAM: CHEST  2 VIEW COMPARISON:  Chest radiograph 10/01/2016. FINDINGS: Stable cardiac and mediastinal contours status post median sternotomy. Mild tortuosity of the thoracic aorta. Patchy consolidation within the lung bases bilaterally, right-greater-than-left. Mid thoracic spine degenerative changes. IMPRESSION: Patchy consolidation within the lung bases bilaterally most suggestive of scarring. No definite superimposed acute process. Electronically Signed   By: Lovey Newcomer [name]   On: 12/08/2016 18:54    EKG: Ordered and pending.   Assessment/Plan  1. Acute kidney superimposed on CKD stage II-III  - SCr is  2.07 on admission, up from apparent baseline in 1.2-1.7 range  - Pt appears dry on admission in setting of fevers, poor appetite, watery diarrhea  - Suspect this is a prerenal azotemia and anticipate improvement with hydration  - He was given a liter of NS in ED and will be continued on a gentle IVF hydration overnight  - Avoid nephrotoxins as possible, repeat chem panel in am   2. Hyperkalemia, mild  - Serum potassium is 5.2 on admission in setting of AKI   - Monitor on telemetry, continue IVF hydration, repeat potassium level overnight  - BMP in am   3. Fevers, cough, malaise  - Suspect an acute viral URI  - Pt received flu shot in October 2017  - Respiratory virus panel ordered  - Infection-control measures with droplet precautions    4. Normocytic anemia   - Hgb is 9.9 on admission - This is stable relative to priors with no suggestion of bleeding  - Recent anemia panel suggests IDA    5. Bladder cancer  - Pt s/p multiple TURBT, most recently 02/2016 - He is followed by urology for regular cystoscopy, but has recently decided against any further surveillance or treatment for this per urology notes    DVT prophylaxis: sq heparin Code Status: Full Family Communication: Wife updated at bedside Disposition Plan: Observe on telemetry Consults called: None Admission status: Observation    Vianne Bulls, MD [facility] Pager [phone]  If 7PM-7AM, please contact night-coverage [email or web address] Password [email or web address]  12/08/2016, 11:09 PM

## 2016-12-09 ENCOUNTER — Encounter (HOSPITAL_COMMUNITY): Payer: Self-pay | Admitting: *Deleted

## 2016-12-09 DIAGNOSIS — E875 Hyperkalemia: Secondary | ICD-10-CM | POA: Diagnosis present

## 2016-12-09 DIAGNOSIS — I129 Hypertensive chronic kidney disease with stage 1 through stage 4 chronic kidney disease, or unspecified chronic kidney disease: Secondary | ICD-10-CM | POA: Diagnosis present

## 2016-12-09 DIAGNOSIS — J069 Acute upper respiratory infection, unspecified: Secondary | ICD-10-CM

## 2016-12-09 DIAGNOSIS — Z87891 Personal history of nicotine dependence: Secondary | ICD-10-CM | POA: Diagnosis not present

## 2016-12-09 DIAGNOSIS — R531 Weakness: Secondary | ICD-10-CM

## 2016-12-09 DIAGNOSIS — C679 Malignant neoplasm of bladder, unspecified: Secondary | ICD-10-CM | POA: Diagnosis present

## 2016-12-09 DIAGNOSIS — Z8551 Personal history of malignant neoplasm of bladder: Secondary | ICD-10-CM | POA: Diagnosis not present

## 2016-12-09 DIAGNOSIS — Z801 Family history of malignant neoplasm of trachea, bronchus and lung: Secondary | ICD-10-CM | POA: Diagnosis not present

## 2016-12-09 DIAGNOSIS — D649 Anemia, unspecified: Secondary | ICD-10-CM | POA: Diagnosis present

## 2016-12-09 DIAGNOSIS — I2510 Atherosclerotic heart disease of native coronary artery without angina pectoris: Secondary | ICD-10-CM | POA: Diagnosis present

## 2016-12-09 DIAGNOSIS — N179 Acute kidney failure, unspecified: Principal | ICD-10-CM

## 2016-12-09 DIAGNOSIS — Z833 Family history of diabetes mellitus: Secondary | ICD-10-CM | POA: Diagnosis not present

## 2016-12-09 DIAGNOSIS — E860 Dehydration: Secondary | ICD-10-CM | POA: Diagnosis present

## 2016-12-09 DIAGNOSIS — Z8546 Personal history of malignant neoplasm of prostate: Secondary | ICD-10-CM | POA: Diagnosis not present

## 2016-12-09 DIAGNOSIS — I10 Essential (primary) hypertension: Secondary | ICD-10-CM

## 2016-12-09 DIAGNOSIS — Z87440 Personal history of urinary (tract) infections: Secondary | ICD-10-CM | POA: Diagnosis not present

## 2016-12-09 DIAGNOSIS — N183 Chronic kidney disease, stage 3 (moderate): Secondary | ICD-10-CM | POA: Diagnosis present

## 2016-12-09 DIAGNOSIS — Z841 Family history of disorders of kidney and ureter: Secondary | ICD-10-CM | POA: Diagnosis not present

## 2016-12-09 DIAGNOSIS — Z951 Presence of aortocoronary bypass graft: Secondary | ICD-10-CM | POA: Diagnosis not present

## 2016-12-09 DIAGNOSIS — Z8249 Family history of ischemic heart disease and other diseases of the circulatory system: Secondary | ICD-10-CM | POA: Diagnosis not present

## 2016-12-09 DIAGNOSIS — R69 Illness, unspecified: Secondary | ICD-10-CM | POA: Diagnosis present

## 2016-12-09 DIAGNOSIS — Z87442 Personal history of urinary calculi: Secondary | ICD-10-CM | POA: Diagnosis not present

## 2016-12-09 DIAGNOSIS — R6889 Other general symptoms and signs: Secondary | ICD-10-CM

## 2016-12-09 LAB — URINALYSIS, ROUTINE W REFLEX MICROSCOPIC
Bilirubin Urine: NEGATIVE
GLUCOSE, UA: NEGATIVE mg/dL
Ketones, ur: NEGATIVE mg/dL
NITRITE: NEGATIVE
PH: 6 (ref 5.0–8.0)
Protein, ur: 100 mg/dL — AB
Specific Gravity, Urine: 1.012 (ref 1.005–1.030)

## 2016-12-09 LAB — SODIUM, URINE, RANDOM: SODIUM UR: 115 mmol/L

## 2016-12-09 LAB — BASIC METABOLIC PANEL
ANION GAP: 8 (ref 5–15)
BUN: 21 mg/dL — ABNORMAL HIGH (ref 6–20)
CHLORIDE: 111 mmol/L (ref 101–111)
CO2: 20 mmol/L — ABNORMAL LOW (ref 22–32)
Calcium: 9.1 mg/dL (ref 8.9–10.3)
Creatinine, Ser: 2.02 mg/dL — ABNORMAL HIGH (ref 0.61–1.24)
GFR calc Af Amer: 33 mL/min — ABNORMAL LOW (ref >60)
GFR, EST NON AFRICAN AMERICAN: 29 mL/min — AB (ref >60)
Glucose, Bld: 96 mg/dL (ref 65–99)
POTASSIUM: 4.8 mmol/L (ref 3.5–5.1)
SODIUM: 139 mmol/L (ref 135–145)

## 2016-12-09 LAB — RESPIRATORY PANEL BY PCR
ADENOVIRUS-RVPPCR: NOT DETECTED
Bordetella pertussis: NOT DETECTED
CORONAVIRUS HKU1-RVPPCR: NOT DETECTED
CORONAVIRUS NL63-RVPPCR: NOT DETECTED
CORONAVIRUS OC43-RVPPCR: NOT DETECTED
Chlamydophila pneumoniae: NOT DETECTED
Coronavirus 229E: NOT DETECTED
Influenza A: NOT DETECTED
Influenza B: NOT DETECTED
METAPNEUMOVIRUS-RVPPCR: NOT DETECTED
Mycoplasma pneumoniae: NOT DETECTED
PARAINFLUENZA VIRUS 2-RVPPCR: NOT DETECTED
PARAINFLUENZA VIRUS 3-RVPPCR: NOT DETECTED
Parainfluenza Virus 1: NOT DETECTED
Parainfluenza Virus 4: NOT DETECTED
RHINOVIRUS / ENTEROVIRUS - RVPPCR: NOT DETECTED
Respiratory Syncytial Virus: NOT DETECTED

## 2016-12-09 LAB — CREATININE, URINE, RANDOM: CREATININE, URINE: 105 mg/dL

## 2016-12-09 LAB — INFLUENZA PANEL BY PCR (TYPE A & B)
Influenza A By PCR: NEGATIVE
Influenza B By PCR: NEGATIVE

## 2016-12-09 LAB — POTASSIUM: POTASSIUM: 4.9 mmol/L (ref 3.5–5.1)

## 2016-12-09 LAB — GLUCOSE, CAPILLARY: Glucose-Capillary: 86 mg/dL (ref 65–99)

## 2016-12-09 MED ORDER — SODIUM CHLORIDE 0.9 % IV SOLN
INTRAVENOUS | Status: DC
  Administered 2016-12-09 – 2016-12-11 (×2): via INTRAVENOUS

## 2016-12-09 MED ORDER — ROSUVASTATIN CALCIUM 10 MG PO TABS
10.0000 mg | ORAL_TABLET | Freq: Every day | ORAL | Status: DC
  Administered 2016-12-09 – 2016-12-10 (×2): 10 mg via ORAL
  Filled 2016-12-09 (×2): qty 1

## 2016-12-09 NOTE — ED Notes (Signed)
Attempted report.  Nurse to call back when available. 

## 2016-12-09 NOTE — ED Notes (Signed)
Moved patient to hospital bed for comfort.  No distress noted at this time.  Call bell within reach.  Encouraged to call for assistance as needed.

## 2016-12-09 NOTE — Progress Notes (Signed)
PROGRESS NOTE    [name]  [id number] DOB: 1931-01-23 DOA: 12/08/2016  PCP: [name], MD   Brief Narrative:  [name] is a 82 y.o. male with medical history significant for coronary artery disease status post remote CABG, bladder cancer status post multiple TURBT, and recurrent UTIs who presents to the emergency department with 1 week of progressive cough, rhinorrhea, malaise, generalized weakness, and fevers.  Subjective: Has cough but no sputum. No dyspnea. No pain. Weakness is better.   Assessment & Plan:  Principal Problem:   AKI (acute kidney injury) with hyperkalemia - Cr 1.2 - 1.3 on 9/17 - now 2.07- prerenal? ATN? - follow cont IVF - K improving  Active Problems:   Acute URI - likely viral- influenza and resp panel neg- symptoms improving    Normochromic normocytic anemia - Hb stable    Benign essential HTN     H/o prostate and bladder cancer  - outpt Urology f/u    DVT prophylaxis: Heparin Code Status: Full code Family Communication:  Disposition Plan: home in 1-2 days Consultants:    Procedures:    Antimicrobials:  Anti-infectives    None       Objective: Vitals:   12/09/16 0530 12/09/16 0545 12/09/16 0554 12/09/16 0645  BP: 115/84 131/79  124/76  Pulse: 85 85  84  Resp:    18  Temp:   99.4 F (37.4 C) 98.2 F (36.8 C)  TempSrc:   Oral Oral  SpO2:  93%  100%  Weight:    105.9 kg (233 lb 6.4 oz)  Height:    6\' 3"  (1.905 m)    Intake/Output Summary (Last 24 hours) at 12/09/16 0837 Last data filed at 12/09/16 0003  Gross per 24 hour  Intake             1000 ml  Output                0 ml  Net             1000 ml   Filed Weights   12/09/16 0645  Weight: 105.9 kg (233 lb 6.4 oz)    Examination: General exam: Appears comfortable  HEENT: PERRLA, oral mucosa moist, no sclera icterus or thrush Respiratory system: Clear to auscultation. Respiratory effort normal.- mild cough Cardiovascular system: S1 & S2  heard, RRR.  No murmurs  Gastrointestinal system: Abdomen soft, non-tender, nondistended. Normal bowel sound. No organomegaly Central nervous system: Alert and oriented. No focal neurological deficits. Extremities: No cyanosis, clubbing or edema Skin: No rashes or ulcers Psychiatry:  Mood & affect appropriate.     Data Reviewed: I have personally reviewed following labs and imaging studies  CBC:  Recent Labs Lab 12/08/16 1754  WBC 10.7*  NEUTROABS 6.6  HGB 9.9*  HCT 30.8*  MCV 83.9  PLT [id number]   Basic Metabolic Panel:  Recent Labs Lab 12/08/16 1754 12/09/16 0103 12/09/16 0256  NA 137  --  139  K 5.2* 4.9 4.8  CL 109  --  111  CO2 19*  --  20*  GLUCOSE 95  --  96  BUN 22*  --  21*  CREATININE 2.07*  --  2.02*  CALCIUM 9.7  --  9.1   GFR: Estimated Creatinine Clearance: 36.5 mL/min (by C-G formula based on SCr of 2.02 mg/dL (H)). Liver Function Tests:  Recent Labs Lab 12/08/16 1754  AST 18  ALT 10*  ALKPHOS 48  BILITOT 0.5  PROT 7.6  ALBUMIN 2.7*   No results for input(s): LIPASE, AMYLASE in the last 168 hours. No results for input(s): AMMONIA in the last 168 hours. Coagulation Profile: No results for input(s): INR, PROTIME in the last 168 hours. Cardiac Enzymes: No results for input(s): CKTOTAL, CKMB, CKMBINDEX, TROPONINI in the last 168 hours. BNP (last 3 results) No results for input(s): PROBNP in the last 8760 hours. HbA1C: No results for input(s): HGBA1C in the last 72 hours. CBG:  Recent Labs Lab 12/09/16 0757  GLUCAP 86   Lipid Profile: No results for input(s): CHOL, HDL, LDLCALC, TRIG, CHOLHDL, LDLDIRECT in the last 72 hours. Thyroid Function Tests: No results for input(s): TSH, T4TOTAL, FREET4, T3FREE, THYROIDAB in the last 72 hours. Anemia Panel: No results for input(s): VITAMINB12, FOLATE, FERRITIN, TIBC, IRON, RETICCTPCT in the last 72 hours. Urine analysis:    Component Value Date/Time   COLORURINE YELLOW 12/09/2016 0153    APPEARANCEUR CLOUDY (A) 12/09/2016 0153   LABSPEC 1.012 12/09/2016 0153   PHURINE 6.0 12/09/2016 0153   GLUCOSEU NEGATIVE 12/09/2016 0153   HGBUR MODERATE (A) 12/09/2016 0153   BILIRUBINUR NEGATIVE 12/09/2016 0153   KETONESUR NEGATIVE 12/09/2016 0153   PROTEINUR 100 (A) 12/09/2016 0153   UROBILINOGEN 0.2 09/16/2015 1741   NITRITE NEGATIVE 12/09/2016 0153   LEUKOCYTESUR LARGE (A) 12/09/2016 0153   Sepsis Labs: @LABRCNTIP (procalcitonin:4,lacticidven:4) )No results found for this or any previous visit (from the past 240 hour(s)).       Radiology Studies: Dg Chest 2 View  Result Date: 12/08/2016 CLINICAL DATA:  Patient with cough for 2-3 weeks. Nausea. Low-grade fever. EXAM: CHEST  2 VIEW COMPARISON:  Chest radiograph 10/01/2016. FINDINGS: Stable cardiac and mediastinal contours status post median sternotomy. Mild tortuosity of the thoracic aorta. Patchy consolidation within the lung bases bilaterally, right-greater-than-left. Mid thoracic spine degenerative changes. IMPRESSION: Patchy consolidation within the lung bases bilaterally most suggestive of scarring. No definite superimposed acute process. Electronically Signed   By: Lovey Newcomer [name]   On: 12/08/2016 18:54      Scheduled Meds: . sodium chloride   Intravenous STAT  . aspirin EC  81 mg Oral Daily  . heparin  5,000 Units Subcutaneous Q8H  . rosuvastatin  10 mg Oral q1800  . sodium chloride flush  3 mL Intravenous Q12H   Continuous Infusions: . sodium chloride Stopped (12/09/16 0232)     LOS: 0 days    Time spent in minutes: 15    [place], MD [facility] Pager: [email or web address] Password [facility] 12/09/2016, 8:37 AM

## 2016-12-10 DIAGNOSIS — D649 Anemia, unspecified: Secondary | ICD-10-CM

## 2016-12-10 DIAGNOSIS — R69 Illness, unspecified: Secondary | ICD-10-CM

## 2016-12-10 DIAGNOSIS — N182 Chronic kidney disease, stage 2 (mild): Secondary | ICD-10-CM

## 2016-12-10 LAB — URINE CULTURE

## 2016-12-10 LAB — BASIC METABOLIC PANEL
ANION GAP: 9 (ref 5–15)
BUN: 17 mg/dL (ref 6–20)
CALCIUM: 9 mg/dL (ref 8.9–10.3)
CO2: 21 mmol/L — AB (ref 22–32)
Chloride: 107 mmol/L (ref 101–111)
Creatinine, Ser: 1.73 mg/dL — ABNORMAL HIGH (ref 0.61–1.24)
GFR, EST AFRICAN AMERICAN: 40 mL/min — AB (ref >60)
GFR, EST NON AFRICAN AMERICAN: 35 mL/min — AB (ref >60)
Glucose, Bld: 94 mg/dL (ref 65–99)
Potassium: 4.5 mmol/L (ref 3.5–5.1)
SODIUM: 137 mmol/L (ref 135–145)

## 2016-12-10 LAB — GLUCOSE, CAPILLARY: GLUCOSE-CAPILLARY: 83 mg/dL (ref 65–99)

## 2016-12-10 LAB — UREA NITROGEN, URINE: Urea Nitrogen, Ur: 420 mg/dL

## 2016-12-10 NOTE — Progress Notes (Signed)
PROGRESS NOTE    [name]  [id number] DOB: 08/18/1931 DOA: 12/08/2016  PCP: [name], MD   Brief Narrative:  [name] is a 83 y.o. male with medical history significant for coronary artery disease status post remote CABG, bladder cancer status post multiple TURBT, and recurrent UTIs who presents to the emergency department with 1 week of progressive cough, rhinorrhea, malaise, generalized weakness, and fevers.  Subjective: Has cough but no sputum. No dyspnea. No pain. Weakness is better.   Assessment & Plan:  Principal Problem:   AKI (acute kidney injury) with hyperkalemia - Cr 1.2 - 1.3 on 9/17 - now 2.07>> 1.73 - prerenal? ATN? - follow- cont IVF - K improving  Active Problems:   Acute URI - likely viral- influenza and resp panel neg- symptoms improving    Normochromic normocytic anemia - Hb stable    Benign essential HTN     H/o prostate and bladder cancer  - outpt Urology f/u    DVT prophylaxis: Heparin Code Status: Full code Family Communication:  Disposition Plan: home in 1-2 days Consultants:    Procedures:    Antimicrobials:  Anti-infectives    None       Objective: Vitals:   12/09/16 1420 12/09/16 2015 12/10/16 0553 12/10/16 1300  BP: 123/72 (!) 145/96 124/73 118/74  Pulse: 77 73  79  Resp: 17 19 18 17   Temp: 98.1 F (36.7 C) 99 F (37.2 C) 99.3 F (37.4 C) 98.8 F (37.1 C)  TempSrc: Oral Oral Oral Oral  SpO2: 100% 99% 99% 98%  Weight:   107 kg (236 lb)   Height:        Intake/Output Summary (Last 24 hours) at 12/10/16 1506 Last data filed at 12/10/16 1030  Gross per 24 hour  Intake           1822.5 ml  Output             1400 ml  Net            422.5 ml   Filed Weights   12/09/16 0645 12/10/16 0553  Weight: 105.9 kg (233 lb 6.4 oz) 107 kg (236 lb)    Examination: General exam: Appears comfortable  HEENT: PERRLA, oral mucosa moist, no sclera icterus or thrush Respiratory system: Clear to  auscultation. Respiratory effort normal.- mild cough Cardiovascular system: S1 & S2 heard, RRR.  No murmurs  Gastrointestinal system: Abdomen soft, non-tender, nondistended. Normal bowel sound. No organomegaly Central nervous system: Alert and oriented. No focal neurological deficits. Extremities: No cyanosis, clubbing or edema Skin: No rashes or ulcers Psychiatry:  Mood & affect appropriate.     Data Reviewed: I have personally reviewed following labs and imaging studies  CBC:  Recent Labs Lab 12/08/16 1754  WBC 10.7*  NEUTROABS 6.6  HGB 9.9*  HCT 30.8*  MCV 83.9  PLT [id number]   Basic Metabolic Panel:  Recent Labs Lab 12/08/16 1754 12/09/16 0103 12/09/16 0256 12/10/16 0239  NA 137  --  139 137  K 5.2* 4.9 4.8 4.5  CL 109  --  111 107  CO2 19*  --  20* 21*  GLUCOSE 95  --  96 94  BUN 22*  --  21* 17  CREATININE 2.07*  --  2.02* 1.73*  CALCIUM 9.7  --  9.1 9.0   GFR: Estimated Creatinine Clearance: 42.8 mL/min (by C-G formula based on SCr of 1.73 mg/dL (H)). Liver Function Tests:  Recent Labs Lab 12/08/16 1754  AST 18  ALT 10*  ALKPHOS 48  BILITOT 0.5  PROT 7.6  ALBUMIN 2.7*   No results for input(s): LIPASE, AMYLASE in the last 168 hours. No results for input(s): AMMONIA in the last 168 hours. Coagulation Profile: No results for input(s): INR, PROTIME in the last 168 hours. Cardiac Enzymes: No results for input(s): CKTOTAL, CKMB, CKMBINDEX, TROPONINI in the last 168 hours. BNP (last 3 results) No results for input(s): PROBNP in the last 8760 hours. HbA1C: No results for input(s): HGBA1C in the last 72 hours. CBG:  Recent Labs Lab 12/09/16 0757 12/10/16 0727  GLUCAP 86 83   Lipid Profile: No results for input(s): CHOL, HDL, LDLCALC, TRIG, CHOLHDL, LDLDIRECT in the last 72 hours. Thyroid Function Tests: No results for input(s): TSH, T4TOTAL, FREET4, T3FREE, THYROIDAB in the last 72 hours. Anemia Panel: No results for input(s): VITAMINB12, FOLATE,  FERRITIN, TIBC, IRON, RETICCTPCT in the last 72 hours. Urine analysis:    Component Value Date/Time   COLORURINE YELLOW 12/09/2016 0153   APPEARANCEUR CLOUDY (A) 12/09/2016 0153   LABSPEC 1.012 12/09/2016 0153   PHURINE 6.0 12/09/2016 0153   GLUCOSEU NEGATIVE 12/09/2016 0153   HGBUR MODERATE (A) 12/09/2016 0153   BILIRUBINUR NEGATIVE 12/09/2016 0153   KETONESUR NEGATIVE 12/09/2016 0153   PROTEINUR 100 (A) 12/09/2016 0153   UROBILINOGEN 0.2 09/16/2015 1741   NITRITE NEGATIVE 12/09/2016 0153   LEUKOCYTESUR LARGE (A) 12/09/2016 0153   Sepsis Labs: @LABRCNTIP (procalcitonin:4,lacticidven:4) ) Recent Results (from the past 240 hour(s))  Culture, blood (Routine x 2)     Status: None (Preliminary result)   Collection Time: 12/08/16  7:38 PM  Result Value Ref Range Status   Specimen Description BLOOD RIGHT ARM  Final   Special Requests IN PEDIATRIC BOTTLE 1CC  Final   Culture NO GROWTH 2 DAYS  Final   Report Status PENDING  Incomplete  Culture, blood (Routine x 2)     Status: None (Preliminary result)   Collection Time: 12/08/16  7:48 PM  Result Value Ref Range Status   Specimen Description BLOOD RIGHT ARM  Final   Special Requests IN PEDIATRIC BOTTLE 2CC  Final   Culture NO GROWTH 2 DAYS  Final   Report Status PENDING  Incomplete  Respiratory Panel by PCR     Status: None   Collection Time: 12/08/16 11:00 PM  Result Value Ref Range Status   Adenovirus NOT DETECTED NOT DETECTED Final   Coronavirus 229E NOT DETECTED NOT DETECTED Final   Coronavirus HKU1 NOT DETECTED NOT DETECTED Final   Coronavirus NL63 NOT DETECTED NOT DETECTED Final   Coronavirus OC43 NOT DETECTED NOT DETECTED Final   Metapneumovirus NOT DETECTED NOT DETECTED Final   Rhinovirus / Enterovirus NOT DETECTED NOT DETECTED Final   Influenza A NOT DETECTED NOT DETECTED Final   Influenza B NOT DETECTED NOT DETECTED Final   Parainfluenza Virus 1 NOT DETECTED NOT DETECTED Final   Parainfluenza Virus 2 NOT DETECTED NOT  DETECTED Final   Parainfluenza Virus 3 NOT DETECTED NOT DETECTED Final   Parainfluenza Virus 4 NOT DETECTED NOT DETECTED Final   Respiratory Syncytial Virus NOT DETECTED NOT DETECTED Final   Bordetella pertussis NOT DETECTED NOT DETECTED Final   Chlamydophila pneumoniae NOT DETECTED NOT DETECTED Final   Mycoplasma pneumoniae NOT DETECTED NOT DETECTED Final  Urine culture     Status: Abnormal   Collection Time: 12/09/16  1:53 AM  Result Value Ref Range Status   Specimen Description URINE, CLEAN CATCH  Final   Special  Requests NONE  Final   Culture MULTIPLE SPECIES PRESENT, SUGGEST RECOLLECTION (A)  Final   Report Status 12/10/2016 FINAL  Final         Radiology Studies: Dg Chest 2 View  Result Date: 12/08/2016 CLINICAL DATA:  Patient with cough for 2-3 weeks. Nausea. Low-grade fever. EXAM: CHEST  2 VIEW COMPARISON:  Chest radiograph 10/01/2016. FINDINGS: Stable cardiac and mediastinal contours status post median sternotomy. Mild tortuosity of the thoracic aorta. Patchy consolidation within the lung bases bilaterally, right-greater-than-left. Mid thoracic spine degenerative changes. IMPRESSION: Patchy consolidation within the lung bases bilaterally most suggestive of scarring. No definite superimposed acute process. Electronically Signed   By: Lovey Newcomer [name]   On: 12/08/2016 18:54      Scheduled Meds: . aspirin EC  81 mg Oral Daily  . heparin  5,000 Units Subcutaneous Q8H  . rosuvastatin  10 mg Oral QHS  . sodium chloride flush  3 mL Intravenous Q12H   Continuous Infusions: . sodium chloride 75 mL/hr at 12/09/16 2229     LOS: 1 day    Time spent in minutes: 49    [place], MD [facility] Pager: [email or web address] Password [email or web address] 12/10/2016, 3:06 PM

## 2016-12-10 NOTE — Progress Notes (Signed)
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 Pt voices no concerns at this time.

## 2016-12-11 LAB — BASIC METABOLIC PANEL
ANION GAP: 10 (ref 5–15)
ANION GAP: 7 (ref 5–15)
BUN: 13 mg/dL (ref 6–20)
BUN: 14 mg/dL (ref 6–20)
CALCIUM: 8.8 mg/dL — AB (ref 8.9–10.3)
CALCIUM: 8.9 mg/dL (ref 8.9–10.3)
CO2: 17 mmol/L — ABNORMAL LOW (ref 22–32)
CO2: 20 mmol/L — ABNORMAL LOW (ref 22–32)
CREATININE: 1.73 mg/dL — AB (ref 0.61–1.24)
CREATININE: 1.7 mg/dL — AB (ref 0.61–1.24)
Chloride: 108 mmol/L (ref 101–111)
Chloride: 107 mmol/L (ref 101–111)
GFR calc Af Amer: 41 mL/min — ABNORMAL LOW (ref >60)
GFR calc non Af Amer: 35 mL/min — ABNORMAL LOW (ref >60)
GFR calc non Af Amer: 35 mL/min — ABNORMAL LOW (ref >60)
GFR, EST AFRICAN AMERICAN: 40 mL/min — AB (ref >60)
Glucose, Bld: 87 mg/dL (ref 65–99)
Glucose, Bld: 85 mg/dL (ref 65–99)
Potassium: 4.7 mmol/L (ref 3.5–5.1)
Potassium: 4.3 mmol/L (ref 3.5–5.1)
SODIUM: 135 mmol/L (ref 135–145)
SODIUM: 134 mmol/L — AB (ref 135–145)

## 2016-12-11 NOTE — Discharge Instructions (Signed)
Need [facility] in 1wk.  Please take all your medications with you for your next visit with your Primary MD. Please request your Primary MD to go over all hospital test results at the follow up. Please ask your Primary MD to get all Hospital records sent to his/her office.  If you experience worsening of your admission symptoms, develop shortness of breath, chest pain, suicidal or homicidal thoughts or a life threatening emergency, you must seek medical attention immediately by calling 911 or calling your MD.  Dennis Bast must read the complete instructions/literature along with all the possible adverse reactions/side effects for all the medicines you take including new medications that have been prescribed to you. Take new medicines after you have completely understood and accpet all the possible adverse reactions/side effects.   Do not drive when taking pain medications or sedatives.    Do not take more than prescribed Pain, Sleep and Anxiety Medications  If you have smoked or chewed Tobacco in the last 2 yrs please stop. Stop any regular alcohol and or recreational drug use.  Wear Seat belts while driving.   Heart-Healthy Eating Plan Many factors influence your heart health, including eating and exercise habits. Heart (coronary) risk increases with abnormal blood fat (lipid) levels. Heart-healthy meal planning includes limiting unhealthy fats, increasing healthy fats, and making other small dietary changes. This includes maintaining a healthy body weight to help keep lipid levels within a normal range. What is my plan? Your health care provider recommends that you:  Get no more than _________% of the total calories in your daily diet from fat.  Limit your intake of saturated fat to less than _________% of your total calories each day.  Limit the amount of cholesterol in your diet to less than _________ mg per day. What types of fat should I choose?  Choose healthy fats more often. Choose  monounsaturated and polyunsaturated fats, such as olive oil and canola oil, flaxseeds, walnuts, almonds, and seeds.  Eat more omega-3 fats. Good choices include salmon, mackerel, sardines, tuna, flaxseed oil, and ground flaxseeds. Aim to eat fish at least two times each week.  Limit saturated fats. Saturated fats are primarily found in animal products, such as meats, butter, and cream. Plant sources of saturated fats include palm oil, palm kernel oil, and coconut oil.  Avoid foods with partially hydrogenated oils in them. These contain trans fats. Examples of foods that contain trans fats are stick margarine, some tub margarines, cookies, crackers, and other baked goods. What general guidelines do I need to follow?  Check food labels carefully to identify foods with trans fats or high amounts of saturated fat.  Fill one half of your plate with vegetables and green salads. Eat 4-5 servings of vegetables per day. A serving of vegetables equals 1 cup of raw leafy vegetables,  cup of raw or cooked cut-up vegetables, or  cup of vegetable juice.  Fill one fourth of your plate with whole grains. Look for the word "whole" as the first word in the ingredient list.  Fill one fourth of your plate with lean protein foods.  Eat 4-5 servings of fruit per day. A serving of fruit equals one medium whole fruit,  cup of dried fruit,  cup of fresh, frozen, or canned fruit, or  cup of 100% fruit juice.  Eat more foods that contain soluble fiber. Examples of foods that contain this type of fiber are apples, broccoli, carrots, beans, peas, and barley. Aim to get 20-30 g of fiber  per day.  Eat more home-cooked food and less restaurant, buffet, and fast food.  Limit or avoid alcohol.  Limit foods that are high in starch and sugar.  Avoid fried foods.  Cook foods by using methods other than frying. Baking, boiling, grilling, and broiling are all great options. Other fat-reducing suggestions  include:  Removing the skin from poultry.  Removing all visible fats from meats.  Skimming the fat off of stews, soups, and gravies before serving them.  Steaming vegetables in water or broth.  Lose weight if you are overweight. Losing just 5-10% of your initial body weight can help your overall health and prevent diseases such as diabetes and heart disease.  Increase your consumption of nuts, legumes, and seeds to 4-5 servings per week. One serving of dried beans or legumes equals  cup after being cooked, one serving of nuts equals 1 ounces, and one serving of seeds equals  ounce or 1 tablespoon.  You may need to monitor your salt (sodium) intake, especially if you have high blood pressure. Talk with your health care provider or dietitian to get more information about reducing sodium. What foods can I eat? Grains  Breads, including [place], white, pita, wheat, raisin, rye, oatmeal, and [place]. Tortillas that are neither fried nor made with lard or trans fat. Low-fat rolls, including hotdog and hamburger buns and English muffins. Biscuits. Muffins. Waffles. Pancakes. Light popcorn. Whole-grain cereals. Flatbread. Melba toast. Pretzels. Breadsticks. Rusks. Low-fat snacks and crackers, including oyster, saltine, matzo, graham, animal, and rye. Rice and pasta, including brown rice and those that are made with whole wheat. Vegetables  All vegetables. Fruits  All fruits, but limit coconut. Meats and Other Protein Sources  Lean, well-trimmed beef, veal, pork, and lamb. Chicken and [place] without skin. All fish and shellfish. Wild duck, rabbit, pheasant, and venison. Egg whites or low-cholesterol egg substitutes. Dried beans, peas, lentils, and tofu.Seeds and most nuts. Dairy  Low-fat or nonfat cheeses, including ricotta, string, and mozzarella. Skim or 1% milk that is liquid, powdered, or evaporated. Buttermilk that is made with low-fat milk. Nonfat or low-fat yogurt. Beverages  Mineral  water. Diet carbonated beverages. Sweets and Desserts  Sherbets and fruit ices. Honey, jam, marmalade, jelly, and syrups. Meringues and gelatins. Pure sugar candy, such as hard candy, jelly beans, gumdrops, mints, marshmallows, and small amounts of dark chocolate. [employer] Eat all sweets and desserts in moderation. Fats and Oils  Nonhydrogenated (trans-free) margarines. Vegetable oils, including soybean, sesame, sunflower, olive, peanut, safflower, corn, canola, and cottonseed. Salad dressings or mayonnaise that are made with a vegetable oil. Limit added fats and oils that you use for cooking, baking, salads, and as spreads. Other  Cocoa powder. Coffee and tea. All seasonings and condiments. The items listed above may not be a complete list of recommended foods or beverages. Contact your dietitian for more options.  What foods are not recommended? Grains  Breads that are made with saturated or trans fats, oils, or whole milk. Croissants. Butter rolls. Cheese breads. Sweet rolls. Donuts. Buttered popcorn. Chow mein noodles. High-fat crackers, such as cheese or butter crackers. Meats and Other Protein Sources  Fatty meats, such as hotdogs, short ribs, sausage, spareribs, bacon, ribeye roast or steak, and mutton. High-fat deli meats, such as salami and bologna. Caviar. Domestic duck and goose. Organ meats, such as kidney, liver, sweetbreads, brains, gizzard, chitterlings, and heart. Dairy  Cream, sour cream, cream cheese, and creamed cottage cheese. Whole milk cheeses, including blue (bleu), Phyllis [employer]inger,  [place], [place], [place], [place], Swiss, [place], [place], and [place]. Whole or 2% milk that is liquid, evaporated, or condensed. Whole buttermilk. Cream sauce or high-fat cheese sauce. Yogurt that is made from whole milk. Beverages  Regular sodas and drinks with added sugar. Sweets and Desserts  Frosting. Pudding. Cookies. Cakes other than angel food cake. Candy that has milk chocolate  or white chocolate, hydrogenated fat, butter, coconut, or unknown ingredients. Buttered syrups. Full-fat ice cream or ice cream drinks. Fats and Oils  Gravy that has suet, meat fat, or shortening. Cocoa butter, hydrogenated oils, palm oil, coconut oil, palm kernel oil. These can often be found in baked products, candy, fried foods, nondairy creamers, and whipped toppings. Solid fats and shortenings, including bacon fat, salt pork, lard, and butter. Nondairy cream substitutes, such as coffee creamers and sour cream substitutes. Salad dressings that are made of unknown oils, cheese, or sour cream. The items listed above may not be a complete list of foods and beverages to avoid. Contact your dietitian for more information.  This information is not intended to replace advice given to you by your health care provider. Make sure you discuss any questions you have with your health care provider. Document Released: 09/04/2008 Document Revised: 06/15/2016 Document Reviewed: 05/20/2014 Elsevier Interactive Patient Education  2017 [place].  Heart-Healthy Eating Plan Many factors influence your heart health, including eating and exercise habits. Heart (coronary) risk increases with abnormal blood fat (lipid) levels. Heart-healthy meal planning includes limiting unhealthy fats, increasing healthy fats, and making other small dietary changes. This includes maintaining a healthy body weight to help keep lipid levels within a normal range. What is my plan? Your health care provider recommends that you:  Get no more than _________% of the total calories in your daily diet from fat.  Limit your intake of saturated fat to less than _________% of your total calories each day.  Limit the amount of cholesterol in your diet to less than _________ mg per day. What types of fat should I choose?  Choose healthy fats more often. Choose monounsaturated and polyunsaturated fats, such as olive oil and canola oil,  flaxseeds, walnuts, almonds, and seeds.  Eat more omega-3 fats. Good choices include salmon, mackerel, sardines, tuna, flaxseed oil, and ground flaxseeds. Aim to eat fish at least two times each week.  Limit saturated fats. Saturated fats are primarily found in animal products, such as meats, butter, and cream. Plant sources of saturated fats include palm oil, palm kernel oil, and coconut oil.  Avoid foods with partially hydrogenated oils in them. These contain trans fats. Examples of foods that contain trans fats are stick margarine, some tub margarines, cookies, crackers, and other baked goods. What general guidelines do I need to follow?  Check food labels carefully to identify foods with trans fats or high amounts of saturated fat.  Fill one half of your plate with vegetables and green salads. Eat 4-5 servings of vegetables per day. A serving of vegetables equals 1 cup of raw leafy vegetables,  cup of raw or cooked cut-up vegetables, or  cup of vegetable juice.  Fill one fourth of your plate with whole grains. Look for the word "whole" as the first word in the ingredient list.  Fill one fourth of your plate with lean protein foods.  Eat 4-5 servings of fruit per day. A serving of fruit equals one medium whole fruit,  cup of dried fruit,  cup of fresh, frozen, or canned fruit, or  cup of  100% fruit juice.  Eat more foods that contain soluble fiber. Examples of foods that contain this type of fiber are apples, broccoli, carrots, beans, peas, and barley. Aim to get 20-30 g of fiber per day.  Eat more home-cooked food and less restaurant, buffet, and fast food.  Limit or avoid alcohol.  Limit foods that are high in starch and sugar.  Avoid fried foods.  Cook foods by using methods other than frying. Baking, boiling, grilling, and broiling are all great options. Other fat-reducing suggestions include:  Removing the skin from poultry.  Removing all visible fats from  meats.  Skimming the fat off of stews, soups, and gravies before serving them.  Steaming vegetables in water or broth.  Lose weight if you are overweight. Losing just 5-10% of your initial body weight can help your overall health and prevent diseases such as diabetes and heart disease.  Increase your consumption of nuts, legumes, and seeds to 4-5 servings per week. One serving of dried beans or legumes equals  cup after being cooked, one serving of nuts equals 1 ounces, and one serving of seeds equals  ounce or 1 tablespoon.  You may need to monitor your salt (sodium) intake, especially if you have high blood pressure. Talk with your health care provider or dietitian to get more information about reducing sodium. What foods can I eat? Grains  Breads, including [place], white, pita, wheat, raisin, rye, oatmeal, and [place]. Tortillas that are neither fried nor made with lard or trans fat. Low-fat rolls, including hotdog and hamburger buns and English muffins. Biscuits. Muffins. Waffles. Pancakes. Light popcorn. Whole-grain cereals. Flatbread. Melba toast. Pretzels. Breadsticks. Rusks. Low-fat snacks and crackers, including oyster, saltine, matzo, graham, animal, and rye. Rice and pasta, including brown rice and those that are made with whole wheat. Vegetables  All vegetables. Fruits  All fruits, but limit coconut. Meats and Other Protein Sources  Lean, well-trimmed beef, veal, pork, and lamb. Chicken and [place] without skin. All fish and shellfish. Wild duck, rabbit, pheasant, and venison. Egg whites or low-cholesterol egg substitutes. Dried beans, peas, lentils, and tofu.Seeds and most nuts. Dairy  Low-fat or nonfat cheeses, including ricotta, string, and mozzarella. Skim or 1% milk that is liquid, powdered, or evaporated. Buttermilk that is made with low-fat milk. Nonfat or low-fat yogurt. Beverages  Mineral water. Diet carbonated beverages. Sweets and Desserts  Sherbets and fruit ices.  Honey, jam, marmalade, jelly, and syrups. Meringues and gelatins. Pure sugar candy, such as hard candy, jelly beans, gumdrops, mints, marshmallows, and small amounts of dark chocolate. [employer] Eat all sweets and desserts in moderation. Fats and Oils  Nonhydrogenated (trans-free) margarines. Vegetable oils, including soybean, sesame, sunflower, olive, peanut, safflower, corn, canola, and cottonseed. Salad dressings or mayonnaise that are made with a vegetable oil. Limit added fats and oils that you use for cooking, baking, salads, and as spreads. Other  Cocoa powder. Coffee and tea. All seasonings and condiments. The items listed above may not be a complete list of recommended foods or beverages. Contact your dietitian for more options.  What foods are not recommended? Grains  Breads that are made with saturated or trans fats, oils, or whole milk. Croissants. Butter rolls. Cheese breads. Sweet rolls. Donuts. Buttered popcorn. Chow mein noodles. High-fat crackers, such as cheese or butter crackers. Meats and Other Protein Sources  Fatty meats, such as hotdogs, short ribs, sausage, spareribs, bacon, ribeye roast or steak, and mutton. High-fat deli meats, such as salami and bologna. Caviar.  Domestic duck and goose. Organ meats, such as kidney, liver, sweetbreads, brains, gizzard, chitterlings, and heart. Dairy  Cream, sour cream, cream cheese, and creamed cottage cheese. Whole milk cheeses, including blue (bleu), [employer], [place], [place], [place], [place], Swiss, [place], [place], and [place]. Whole or 2% milk that is liquid, evaporated, or condensed. Whole buttermilk. Cream sauce or high-fat cheese sauce. Yogurt that is made from whole milk. Beverages  Regular sodas and drinks with added sugar. Sweets and Desserts  Frosting. Pudding. Cookies. Cakes other than angel food cake. Candy that has milk chocolate or white chocolate, hydrogenated fat, butter, coconut, or unknown ingredients.  Buttered syrups. Full-fat ice cream or ice cream drinks. Fats and Oils  Gravy that has suet, meat fat, or shortening. Cocoa butter, hydrogenated oils, palm oil, coconut oil, palm kernel oil. These can often be found in baked products, candy, fried foods, nondairy creamers, and whipped toppings. Solid fats and shortenings, including bacon fat, salt pork, lard, and butter. Nondairy cream substitutes, such as coffee creamers and sour cream substitutes. Salad dressings that are made of unknown oils, cheese, or sour cream. The items listed above may not be a complete list of foods and beverages to avoid. Contact your dietitian for more information.  This information is not intended to replace advice given to you by your health care provider. Make sure you discuss any questions you have with your health care provider. Document Released: 09/04/2008 Document Revised: 06/15/2016 Document Reviewed: 05/20/2014 Elsevier Interactive Patient Education  2017 [employer].

## 2016-12-11 NOTE — Evaluation (Signed)
Physical Therapy Evaluation Patient Details Name: [name] MRN: [id number] DOB: 1931-08-05 Today's Date: 12/11/2016   History of Present Illness   83 y.o. male with medical history significant for coronary artery disease status post remote CABG, bladder cancer status post multiple TURBT, and recurrent UTIs who presents to the emergency department with 1 week of progressive cough, malaise, generalized weakness, and fevers.   Clinical Impression  Pt presents at prior level of functioning, no further acute PT needs at this time. Pt will benefit from home health as he still has strength deficits that could be addressed at that level of care.    Follow Up Recommendations Home health PT    Equipment Recommendations       Recommendations for Other Services       Precautions / Restrictions Precautions Precautions: Fall Restrictions Weight Bearing Restrictions: No      Mobility  Bed Mobility Overal bed mobility: Modified Independent             General bed mobility comments: uses bed controls, pt states he has a bed at home that can be adjusted like a hospital bed  Transfers Overall transfer level: Needs assistance Equipment used: Rolling walker (2 wheeled) Transfers: Sit to/from Stand Sit to Stand: Supervision         General transfer comment: pt requires no lifting assistance but requires PT to hold RW as he pulls up with 1 hand and pushes with 1 UE from the bed. Pt states his wife is able to help him with this at home  Ambulation/Gait Ambulation/Gait assistance: Supervision Ambulation Distance (Feet): 150 Feet Assistive device: Rolling walker (2 wheeled)       General Gait Details: no shortness of breath, no LOB, HR 74bpm after gait  Stairs            Wheelchair Mobility    Modified Rankin (Stroke Patients Only)       Balance                                             Pertinent Vitals/Pain Pain Assessment: No/denies pain     Home Living Family/patient expects to be discharged to:: Private residence Living Arrangements: Spouse/significant other Available Help at Discharge: Family;Available 24 hours/day Type of Home: House Home Access: Stairs to enter   [employer] of Steps: 1 step Home Layout: One level Home Equipment: Walker - 2 wheels;Walker - 4 wheels Additional Comments: Pt reports he uses RW for all mobility    Prior Function Level of Independence: Independent with assistive device(s)               Hand Dominance        Extremity/Trunk Assessment   Upper Extremity Assessment Upper Extremity Assessment: Generalized weakness    Lower Extremity Assessment Lower Extremity Assessment: Generalized weakness    Cervical / Trunk Assessment Cervical / Trunk Assessment: Normal  Communication      Cognition Arousal/Alertness: Awake/alert Behavior During Therapy: WFL for tasks assessed/performed Overall Cognitive Status: Within Functional Limits for tasks assessed                      General Comments General comments (skin integrity, edema, etc.): pt supervision for standing balance with RW    Exercises     Assessment/Plan    PT Assessment All further PT needs can be met  in the next venue of care  PT Problem List Decreased strength;Decreased activity tolerance;Decreased mobility;Cardiopulmonary status limiting activity          PT Treatment Interventions      PT Goals (Current goals can be found in the Care Plan section)  Acute Rehab PT Goals PT Goal Formulation: All assessment and education complete, DC therapy    Frequency     Barriers to discharge        Co-evaluation               End of Session   Activity Tolerance: Patient tolerated treatment well Patient left: in bed;with call bell/phone within reach;with bed alarm set Nurse Communication: Mobility status         Time: 1020-1040 PT Time Calculation (min) (ACUTE ONLY): 20  min   Charges:   PT Evaluation $PT Eval Moderate Complexity: 1 Procedure PT Treatments $Therapeutic Activity: 8-22 mins   PT G Codes:        [name] 01-10-17, 10:47 AM

## 2016-12-11 NOTE — Care Management Note (Signed)
Case Management Note  Patient Details  Name: REGGY DRYER MRN: [id number] Date of Birth: [birth year]  Subjective/Objective:  Pt presented for AKI. Pt is from home with wife and plans to return home with PT. Pt has DME RW and Cane.                   Action/Plan: CM did offer choice for [place] and pt chose [facility]. CM did make referral and SOC to begin within 24-48 hours post d/c. No further needs from CM at this time.   Expected Discharge Date:                  Expected Discharge Plan:  Home/Self Care  In-House Referral:  NA  Discharge planning Services  CM Consult  Post Acute Care Choice:  Home Health Choice offered to:  Patient  DME Arranged:  N/A DME Agency:  NA  HH Arranged:  PT [place] Agency:  [facility] (now [facility])  Status of Service:  Completed, signed off  If discussed at [place] of Stay Meetings, dates discussed:    Additional Comments:  Bethena Roys, RN 12/11/2016, 12:19 PM

## 2016-12-11 NOTE — Discharge Summary (Signed)
Physician Discharge Summary  [name] [id number] DOB: 02/10/1931 DOA: 12/08/2016  PCP: [name], MD  Admit date: 12/08/2016 Discharge date: 01/02/2017  Admitted From: home  Disposition:  home   Discharge Condition:  stable   CODE STATUS:  Full code   Diet recommendation:  Heart healthy Consultations:      Discharge Diagnoses:  Principal Problem:   AKI (acute kidney injury) ([place]) Active Problems:   Hyperkalemia   Flu-like symptoms   Normochromic normocytic anemia   Benign essential HTN   CKD (chronic kidney disease), stage II   Bladder cancer (HCC)   Generalized weakness    Subjective: No complaints today.   Brief Summary: [name] a 83 y.o.malewith medical history significant forcoronary artery disease status post remote CABG, bladder cancer status post multiple TURBT, and recurrent UTIs who presents to the emergency department with 1 week of progressive cough, rhinorrhea, malaise, generalized weakness, and fevers.   Hospital Course:  Principal Problem:   AKI (acute kidney injury) with hyperkalemia - Cr 1.2 - 1.3 on 9/17 -  2.07 on admission - Cr and K+ both improved with IVF- Cr now 1.70, K is 4.3   Active Problems:   Acute URI - likely viral- influenza and resp panel neg- symptoms improving    Normochromic normocytic anemia - Hb stable    Benign essential HTN     H/o prostate and bladder cancer  - outpt Urology f/u  Discharge Instructions  Discharge Instructions    Diet - low sodium heart healthy    Complete by:  As directed    Increase activity slowly    Complete by:  As directed      Allergies as of 12/11/2016      Reactions   Gabapentin Other (See Comments)   Shaking   Other Other (See Comments)   Antihistamines-unknown reaction      Medication List    TAKE these medications   acetaminophen 500 MG tablet Commonly known as:  TYLENOL Take 500-1,000 mg by mouth every 6 (six) hours as needed for mild  pain.   aspirin 81 MG EC tablet Take 1 tablet (81 mg total) by mouth daily.   rosuvastatin 10 MG tablet Commonly known as:  CRESTOR Take 10 mg by mouth daily. pw   traMADol 50 MG tablet Commonly known as:  ULTRAM Take 1 tablet (50 mg total) by mouth every 6 (six) hours as needed for moderate pain.      Follow-up Information    [name], MD. Go on 12/18/2016.   Specialty:  Cardiology Why:  you need to ask your PCP to do a BMET (blood work) in 5-7 days.  follow up @115  Contact information: [address] [address] [place] Follow up.   Specialty:  Home Health Services Why:  Physical Therapy Contact information: [address] [place] [address] [phone]          Allergies  Allergen Reactions  . Gabapentin Other (See Comments)    Shaking  . Other Other (See Comments)    Antihistamines-unknown reaction     Procedures/Studies:    Dg Chest 2 View  Result Date: 12/08/2016 CLINICAL DATA:  Patient with cough for 2-3 weeks. Nausea. Low-grade fever. EXAM: CHEST  2 VIEW COMPARISON:  Chest radiograph 10/01/2016. FINDINGS: Stable cardiac and mediastinal contours status post median sternotomy. Mild tortuosity of the thoracic aorta. Patchy consolidation within  the lung bases bilaterally, right-greater-than-left. Mid thoracic spine degenerative changes. IMPRESSION: Patchy consolidation within the lung bases bilaterally most suggestive of scarring. No definite superimposed acute process. Electronically Signed   By: Lovey Newcomer [name]   On: 12/08/2016 18:54       Discharge Exam: Vitals:   12/10/16 2216 12/11/16 0544  BP: 135/84 (!) 147/80  Pulse: 73 80  Resp:    Temp: 100 F (37.8 C) 99.6 F (37.6 C)   Vitals:   12/10/16 0553 12/10/16 1300 12/10/16 2216 12/11/16 0544  BP: 124/73 118/74 135/84 (!) 147/80  Pulse:  79 73 80  Resp: 18 17    Temp: 99.3 F (37.4 C) 98.8 F (37.1 C) 100 F (37.8  C) 99.6 F (37.6 C)  TempSrc: Oral Oral Oral Oral  SpO2: 99% 98% 99% 97%  Weight: 107 kg (236 lb)   107.3 kg (236 lb 8 oz)  Height:        General: Pt is alert, awake, not in acute distress Cardiovascular: RRR, S1/S2 +, no rubs, no gallops Respiratory: CTA bilaterally, no wheezing, no rhonchi Abdominal: Soft, NT, ND, bowel sounds + Extremities: no edema, no cyanosis    The results of significant diagnostics from this hospitalization (including imaging, microbiology, ancillary and laboratory) are listed below for reference.     Microbiology: No results found for this or any previous visit (from the past 240 hour(s)).   Labs: BNP (last 3 results) No results for input(s): BNP in the last 8760 hours. Basic Metabolic Panel: No results for input(s): NA, K, CL, CO2, GLUCOSE, BUN, CREATININE, CALCIUM, MG, PHOS in the last 168 hours. Liver Function Tests: No results for input(s): AST, ALT, ALKPHOS, BILITOT, PROT, ALBUMIN in the last 168 hours. No results for input(s): LIPASE, AMYLASE in the last 168 hours. No results for input(s): AMMONIA in the last 168 hours. CBC: No results for input(s): WBC, NEUTROABS, HGB, HCT, MCV, PLT in the last 168 hours. Cardiac Enzymes: No results for input(s): CKTOTAL, CKMB, CKMBINDEX, TROPONINI in the last 168 hours. BNP: Invalid input(s): POCBNP CBG: No results for input(s): GLUCAP in the last 168 hours. D-Dimer No results for input(s): DDIMER in the last 72 hours. Hgb A1c No results for input(s): HGBA1C in the last 72 hours. Lipid Profile No results for input(s): CHOL, HDL, LDLCALC, TRIG, CHOLHDL, LDLDIRECT in the last 72 hours. Thyroid function studies No results for input(s): TSH, T4TOTAL, T3FREE, THYROIDAB in the last 72 hours.  Invalid input(s): FREET3 Anemia work up No results for input(s): VITAMINB12, FOLATE, FERRITIN, TIBC, IRON, RETICCTPCT in the last 72 hours. Urinalysis    Component Value Date/Time   COLORURINE YELLOW 12/09/2016  0153   APPEARANCEUR CLOUDY (A) 12/09/2016 0153   LABSPEC 1.012 12/09/2016 0153   PHURINE 6.0 12/09/2016 0153   GLUCOSEU NEGATIVE 12/09/2016 0153   HGBUR MODERATE (A) 12/09/2016 0153   BILIRUBINUR NEGATIVE 12/09/2016 0153   KETONESUR NEGATIVE 12/09/2016 0153   PROTEINUR 100 (A) 12/09/2016 0153   UROBILINOGEN 0.2 09/16/2015 1741   NITRITE NEGATIVE 12/09/2016 0153   LEUKOCYTESUR LARGE (A) 12/09/2016 0153   Sepsis Labs Invalid input(s): PROCALCITONIN,  WBC,  LACTICIDVEN Microbiology No results found for this or any previous visit (from the past 240 hour(s)).   Time coordinating discharge: Over 30 minutes  SIGNED:   Debbe Odea, MD  [facility] 01/02/2017, 5:22 PM Pager   If 7PM-7AM, please contact night-coverage [email or web address] Password [email or web address]

## 2016-12-13 LAB — CULTURE, BLOOD (ROUTINE X 2)
Culture: NO GROWTH
Culture: NO GROWTH

## 2017-01-16 ENCOUNTER — Encounter: Payer: Self-pay | Admitting: Podiatry

## 2017-01-16 ENCOUNTER — Ambulatory Visit (INDEPENDENT_AMBULATORY_CARE_PROVIDER_SITE_OTHER): Payer: Medicare Other | Admitting: Podiatry

## 2017-01-16 DIAGNOSIS — M79676 Pain in unspecified toe(s): Secondary | ICD-10-CM

## 2017-01-16 DIAGNOSIS — B351 Tinea unguium: Secondary | ICD-10-CM

## 2017-01-16 NOTE — Progress Notes (Signed)
Patient ID: [name], male   DOB: 1931-10-13, 83 y.o.   MRN: [id number] Complaint:  Visit Type: Patient returns to my office for continued preventative foot care services. Complaint: Patient states" my nails have grown long and thick and become painful to walk and wear shoes" . The patient presents for preventative foot care services. Patient says he has been hospitalized for over a year.  Podiatric Exam: Vascular: dorsalis pedis and posterior tibial pulses are palpable bilateral. Capillary return is immediate. Temperature gradient is WNL. Skin turgor WNL  Sensorium: Normal Semmes Weinstein monofilament test. Normal tactile sensation bilaterally. Nail Exam: Pt has thick disfigured discolored nails with subungual debris noted bilateral entire nail hallux through fifth toenails Ulcer Exam: There is no evidence of ulcer or pre-ulcerative changes or infection. Orthopedic Exam: Muscle tone and strength are WNL. No limitations in general ROM. No crepitus or effusions noted. Foot type and digits show no abnormalities. Bony prominences are unremarkable. Skin: No Porokeratosis. No infection or ulcers  Diagnosis:  Onychomycosis, , Pain in right toe, pain in left toes  Treatment & Plan Procedures and Treatment: Consent by patient was obtained for treatment procedures. The patient understood the discussion of treatment and procedures well. All questions were answered thoroughly reviewed. Debridement of mycotic and hypertrophic toenails, 1 through 5 bilateral and clearing of subungual debris. No ulceration, no infection noted.  Return Visit-Office Procedure: Patient instructed to return to the office for a follow up visit 3 months for continued evaluation and treatment.  Gardiner Barefoot DPM

## 2017-02-22 ENCOUNTER — Inpatient Hospital Stay (HOSPITAL_COMMUNITY)
Admission: EM | Admit: 2017-02-22 | Discharge: 2017-02-26 | DRG: 682 | Disposition: A | Discharge status: Home / Self Care | Payer: Medicare Other | Attending: Internal Medicine | Admitting: Internal Medicine

## 2017-02-22 ENCOUNTER — Encounter (HOSPITAL_COMMUNITY): Payer: Self-pay

## 2017-02-22 DIAGNOSIS — Z79899 Other long term (current) drug therapy: Secondary | ICD-10-CM

## 2017-02-22 DIAGNOSIS — D72828 Other elevated white blood cell count: Secondary | ICD-10-CM

## 2017-02-22 DIAGNOSIS — N182 Chronic kidney disease, stage 2 (mild): Secondary | ICD-10-CM | POA: Diagnosis present

## 2017-02-22 DIAGNOSIS — Z87891 Personal history of nicotine dependence: Secondary | ICD-10-CM

## 2017-02-22 DIAGNOSIS — Z801 Family history of malignant neoplasm of trachea, bronchus and lung: Secondary | ICD-10-CM

## 2017-02-22 DIAGNOSIS — B965 Pseudomonas (aeruginosa) (mallei) (pseudomallei) as the cause of diseases classified elsewhere: Secondary | ICD-10-CM | POA: Diagnosis present

## 2017-02-22 DIAGNOSIS — Z8546 Personal history of malignant neoplasm of prostate: Secondary | ICD-10-CM

## 2017-02-22 DIAGNOSIS — Z7982 Long term (current) use of aspirin: Secondary | ICD-10-CM

## 2017-02-22 DIAGNOSIS — N179 Acute kidney failure, unspecified: Principal | ICD-10-CM | POA: Diagnosis present

## 2017-02-22 DIAGNOSIS — Z87442 Personal history of urinary calculi: Secondary | ICD-10-CM

## 2017-02-22 DIAGNOSIS — K921 Melena: Secondary | ICD-10-CM | POA: Diagnosis present

## 2017-02-22 DIAGNOSIS — I129 Hypertensive chronic kidney disease with stage 1 through stage 4 chronic kidney disease, or unspecified chronic kidney disease: Secondary | ICD-10-CM | POA: Diagnosis present

## 2017-02-22 DIAGNOSIS — N390 Urinary tract infection, site not specified: Secondary | ICD-10-CM | POA: Diagnosis present

## 2017-02-22 DIAGNOSIS — E872 Acidosis, unspecified: Secondary | ICD-10-CM

## 2017-02-22 DIAGNOSIS — Z841 Family history of disorders of kidney and ureter: Secondary | ICD-10-CM

## 2017-02-22 DIAGNOSIS — N183 Chronic kidney disease, stage 3 (moderate): Secondary | ICD-10-CM | POA: Diagnosis present

## 2017-02-22 DIAGNOSIS — Z66 Do not resuscitate: Secondary | ICD-10-CM | POA: Diagnosis present

## 2017-02-22 DIAGNOSIS — C679 Malignant neoplasm of bladder, unspecified: Secondary | ICD-10-CM | POA: Diagnosis present

## 2017-02-22 DIAGNOSIS — N136 Pyonephrosis: Secondary | ICD-10-CM | POA: Diagnosis present

## 2017-02-22 DIAGNOSIS — Z8249 Family history of ischemic heart disease and other diseases of the circulatory system: Secondary | ICD-10-CM

## 2017-02-22 DIAGNOSIS — Z515 Encounter for palliative care: Secondary | ICD-10-CM

## 2017-02-22 DIAGNOSIS — R310 Gross hematuria: Secondary | ICD-10-CM | POA: Diagnosis present

## 2017-02-22 DIAGNOSIS — N3001 Acute cystitis with hematuria: Secondary | ICD-10-CM

## 2017-02-22 DIAGNOSIS — R52 Pain, unspecified: Secondary | ICD-10-CM

## 2017-02-22 DIAGNOSIS — D649 Anemia, unspecified: Secondary | ICD-10-CM | POA: Diagnosis present

## 2017-02-22 DIAGNOSIS — I2510 Atherosclerotic heart disease of native coronary artery without angina pectoris: Secondary | ICD-10-CM | POA: Diagnosis present

## 2017-02-22 DIAGNOSIS — N19 Unspecified kidney failure: Secondary | ICD-10-CM

## 2017-02-22 DIAGNOSIS — Z888 Allergy status to other drugs, medicaments and biological substances status: Secondary | ICD-10-CM

## 2017-02-22 DIAGNOSIS — E875 Hyperkalemia: Secondary | ICD-10-CM | POA: Diagnosis present

## 2017-02-22 DIAGNOSIS — Z8551 Personal history of malignant neoplasm of bladder: Secondary | ICD-10-CM

## 2017-02-22 DIAGNOSIS — Z7189 Other specified counseling: Secondary | ICD-10-CM

## 2017-02-22 DIAGNOSIS — D62 Acute posthemorrhagic anemia: Secondary | ICD-10-CM | POA: Diagnosis present

## 2017-02-22 DIAGNOSIS — Z960 Presence of urogenital implants: Secondary | ICD-10-CM | POA: Diagnosis present

## 2017-02-22 DIAGNOSIS — N202 Calculus of kidney with calculus of ureter: Secondary | ICD-10-CM | POA: Diagnosis present

## 2017-02-22 DIAGNOSIS — Z833 Family history of diabetes mellitus: Secondary | ICD-10-CM

## 2017-02-22 DIAGNOSIS — Z6827 Body mass index (BMI) 27.0-27.9, adult: Secondary | ICD-10-CM

## 2017-02-22 DIAGNOSIS — E43 Unspecified severe protein-calorie malnutrition: Secondary | ICD-10-CM | POA: Insufficient documentation

## 2017-02-22 DIAGNOSIS — R000 Tachycardia, unspecified: Secondary | ICD-10-CM | POA: Diagnosis present

## 2017-02-22 DIAGNOSIS — R627 Adult failure to thrive: Secondary | ICD-10-CM | POA: Diagnosis present

## 2017-02-22 DIAGNOSIS — I10 Essential (primary) hypertension: Secondary | ICD-10-CM | POA: Diagnosis present

## 2017-02-22 DIAGNOSIS — C61 Malignant neoplasm of prostate: Secondary | ICD-10-CM | POA: Diagnosis present

## 2017-02-22 DIAGNOSIS — Z951 Presence of aortocoronary bypass graft: Secondary | ICD-10-CM

## 2017-02-22 DIAGNOSIS — D638 Anemia in other chronic diseases classified elsewhere: Secondary | ICD-10-CM | POA: Diagnosis present

## 2017-02-22 DIAGNOSIS — N320 Bladder-neck obstruction: Secondary | ICD-10-CM | POA: Diagnosis present

## 2017-02-22 DIAGNOSIS — I25810 Atherosclerosis of coronary artery bypass graft(s) without angina pectoris: Secondary | ICD-10-CM | POA: Diagnosis present

## 2017-02-22 DIAGNOSIS — E860 Dehydration: Secondary | ICD-10-CM | POA: Diagnosis present

## 2017-02-22 DIAGNOSIS — Z87440 Personal history of urinary (tract) infections: Secondary | ICD-10-CM

## 2017-02-22 LAB — CBC
HCT: 28.4 % — ABNORMAL LOW (ref 39.0–52.0)
Hemoglobin: 9.8 g/dL — ABNORMAL LOW (ref 13.0–17.0)
MCH: 27.7 pg (ref 26.0–34.0)
MCHC: 34.5 g/dL (ref 30.0–36.0)
MCV: 80.2 fL (ref 78.0–100.0)
Platelets: 348 10*3/uL (ref 150–400)
RBC: 3.54 MIL/uL — AB (ref 4.22–5.81)
RDW: 15.3 % (ref 11.5–15.5)
WBC: 15 10*3/uL — AB (ref 4.0–10.5)

## 2017-02-22 LAB — BASIC METABOLIC PANEL
Anion gap: 17 — ABNORMAL HIGH (ref 5–15)
BUN: 121 mg/dL — ABNORMAL HIGH (ref 6–20)
CO2: 11 mmol/L — ABNORMAL LOW (ref 22–32)
Calcium: 9.3 mg/dL (ref 8.9–10.3)
Chloride: 104 mmol/L (ref 101–111)
Creatinine, Ser: 17.69 mg/dL — ABNORMAL HIGH (ref 0.61–1.24)
GFR calc non Af Amer: 2 mL/min — ABNORMAL LOW (ref >60)
GFR, EST AFRICAN AMERICAN: 2 mL/min — AB (ref >60)
Glucose, Bld: 90 mg/dL (ref 65–99)
Potassium: 6.8 mmol/L (ref 3.5–5.1)
SODIUM: 132 mmol/L — AB (ref 135–145)

## 2017-02-22 MED ORDER — DEXTROSE 50 % IV SOLN
1.0000 | Freq: Once | INTRAVENOUS | Status: AC
  Administered 2017-02-22: 50 mL via INTRAVENOUS
  Filled 2017-02-22: qty 50

## 2017-02-22 MED ORDER — SODIUM CHLORIDE 0.9 % IV SOLN
Freq: Once | INTRAVENOUS | Status: AC
  Administered 2017-02-22: 23:00:00 via INTRAVENOUS

## 2017-02-22 MED ORDER — FENTANYL CITRATE (PF) 100 MCG/2ML IJ SOLN
50.0000 ug | Freq: Once | INTRAMUSCULAR | Status: AC
  Administered 2017-02-22: 50 ug via INTRAVENOUS
  Filled 2017-02-22: qty 2

## 2017-02-22 MED ORDER — SODIUM CHLORIDE 0.9 % IV BOLUS (SEPSIS)
1000.0000 mL | Freq: Once | INTRAVENOUS | Status: AC
  Administered 2017-02-23: 1000 mL via INTRAVENOUS

## 2017-02-22 MED ORDER — INSULIN ASPART 100 UNIT/ML ~~LOC~~ SOLN
5.0000 [IU] | Freq: Once | SUBCUTANEOUS | Status: AC
  Administered 2017-02-22: 5 [IU] via INTRAVENOUS
  Filled 2017-02-22: qty 1

## 2017-02-22 MED ORDER — SODIUM CHLORIDE 0.9 % IV SOLN
1.0000 g | Freq: Once | INTRAVENOUS | Status: AC
  Administered 2017-02-22: 1 g via INTRAVENOUS
  Filled 2017-02-22: qty 10

## 2017-02-22 MED ORDER — SODIUM BICARBONATE 8.4 % IV SOLN
INTRAVENOUS | Status: DC
  Administered 2017-02-23 – 2017-02-26 (×9): via INTRAVENOUS
  Filled 2017-02-22 (×17): qty 850

## 2017-02-22 MED ORDER — ALBUTEROL (5 MG/ML) CONTINUOUS INHALATION SOLN
10.0000 mg/h | INHALATION_SOLUTION | Freq: Once | RESPIRATORY_TRACT | Status: AC
  Administered 2017-02-22: 10 mg/h via RESPIRATORY_TRACT
  Filled 2017-02-22: qty 20

## 2017-02-22 MED ORDER — LIDOCAINE HCL 2 % EX GEL
1.0000 "application " | Freq: Once | CUTANEOUS | Status: DC | PRN
  Filled 2017-02-22: qty 11

## 2017-02-22 NOTE — ED Notes (Signed)
MD notified of pt.'s serum K 6.30mmol/L

## 2017-02-22 NOTE — ED Triage Notes (Signed)
Pt BIB [facility] from home c/o low urine output for the previous 2 days and no output today. When he did have urine is was dark, bloody, and foul smelling. He states that he hast been able to eat or drink for the last 2 days. Hx of prostate and bladder cancer. A&Ox3. Wife states that he has bee more sluggish that normal.

## 2017-02-22 NOTE — ED Provider Notes (Signed)
[facility] DEPT Provider Note   CSN: [id number] Arrival date & time: 02/22/17  1759     History   Chief Complaint Chief Complaint  Patient presents with  . Urinary Retention    HPI [name] is a 83 y.o. male.  HPI   83 year old male with past medical history as below who presents with difficulty urinating. According to the wife and patient, he has had 2 days of dark, bloody, and foul-smelling urine. He has also been increasingly confused. He is also had decreased intake. He had a small amount of blood in his urine tonight and then has not been able to urinate throughout the day. He has subsequently developed aching, throbbing lower abdominal pain with associated nausea. No vomiting. No fevers. Wife does state he seems more confused than usual. He has a history of complicated prostate surgeries as well as urinary retention in the past.  Past Medical History:  Diagnosis Date  . Bladder cancer ([place]) 05/2015  . CAD (coronary artery disease)   . Hyperlipidemia   . Hypertension   . Kidney stones   . Prostate CA [facility])     Patient Active Problem List   Diagnosis Date Noted  . Hyperkalemia 12/08/2016  . Flu-like symptoms 12/08/2016  . Sepsis due to urinary tract infection ([place]) 10/01/2016  . Generalized weakness   . UTI (lower urinary tract infection) 09/01/2016  . Bladder cancer ([place]) 09/01/2016  . Neuropathic pain   . Contact dermatitis   . AKI (acute kidney injury) ([place])   . Recurrent UTI   . Leukocytosis   . Absence of bladder continence   . Pain   . Hydrocele in adult   . Acute cystitis with hematuria   . Acute blood loss anemia   . Anemia of chronic disease   . Other secondary hypertension   . Acute idiopathic gout of left knee   . Urinary retention   . Debilitated 08/02/2016  . Bilateral hydronephrosis   . Normochromic normocytic anemia   . E-coli UTI   . Benign essential HTN   . Coronary artery disease involving coronary bypass graft of native  heart without angina pectoris   . BPH (benign prostatic hyperplasia)   . Kidney stones   . CKD (chronic kidney disease), stage II   . Morbid obesity due to excess calories ([place])   . Acute renal failure (ARF) ([place]) 07/29/2016    Past Surgical History:  Procedure Laterality Date  . CARDIAC SURGERY    . CORONARY ARTERY BYPASS GRAFT    . PENILE PROSTHESIS IMPLANT    . PROSTATE SURGERY         Home Medications    Prior to Admission medications   Medication Sig Start Date End Date Taking? Authorizing Provider  Multiple Vitamin (MULTIVITAMIN WITH MINERALS) TABS tablet Take 1 tablet by mouth daily.   Yes Historical Provider, MD  rosuvastatin (CRESTOR) 10 MG tablet Take 10 mg by mouth daily. pw 08/11/16  Yes Historical Provider, MD  traMADol (ULTRAM) 50 MG tablet Take 1 tablet (50 mg total) by mouth every 6 (six) hours as needed for moderate pain. 08/16/16  Yes Daniel J Angiulli, PA-C  aspirin EC 81 MG EC tablet Take 1 tablet (81 mg total) by mouth daily. Patient not taking: Reported on 02/22/2017 10/04/16   Clanford Marisa Hua, MD    Family History Family History  Problem Relation Age of Onset  . Congestive Heart Failure Mother   . Kidney failure Sister   .  Hypertension Sister   . Diabetes Mellitus II Brother   . CAD Brother   . Lung cancer Brother   . Colon cancer Neg Hx   . Esophageal cancer Neg Hx     Social History Social History  Substance Use Topics  . Smoking status: Former Research scientist (life sciences)  . Smokeless tobacco: Never Used  . Alcohol use No     Allergies   Gabapentin; Aspirin; and Other   Review of Systems Review of Systems  Constitutional: Positive for fatigue. Negative for chills and fever.  HENT: Negative for congestion and rhinorrhea.   Eyes: Negative for visual disturbance.  Respiratory: Negative for cough, shortness of breath and wheezing.   Cardiovascular: Negative for chest pain and leg swelling.  Gastrointestinal: Negative for abdominal pain, diarrhea, nausea  and vomiting.  Genitourinary: Negative for dysuria and flank pain.  Musculoskeletal: Negative for neck pain and neck stiffness.  Skin: Negative for rash and wound.  Allergic/Immunologic: Negative for immunocompromised state.  Neurological: Negative for syncope, weakness and headaches.  All other systems reviewed and are negative.    Physical Exam Updated Vital Signs BP 128/79   Pulse (!) 106   Temp 97.6 F (36.4 C) (Oral)   Resp 20   SpO2 98%   Physical Exam  Constitutional: He is oriented to person, place, and time. He appears well-developed and well-nourished. No distress.  HENT:  Head: Normocephalic and atraumatic.  Markedly dry MM  Eyes: Conjunctivae are normal.  Neck: Neck supple.  Cardiovascular: Normal rate, regular rhythm and normal heart sounds.  Exam reveals no friction rub.   No murmur heard. Pulmonary/Chest: Effort normal and breath sounds normal. No respiratory distress. He has no wheezes. He has no rales.  Abdominal: Soft. Bowel sounds are normal. He exhibits no distension. There is no tenderness. There is no guarding.  Musculoskeletal: He exhibits no edema.  Neurological: He is alert and oriented to person, place, and time. He exhibits normal muscle tone.  Skin: Skin is warm. Capillary refill takes less than 2 seconds.  Psychiatric: He has a normal mood and affect.  Nursing note and vitals reviewed.    ED Treatments / Results  Labs (all labs ordered are listed, but only abnormal results are displayed) Labs Reviewed  URINALYSIS, ROUTINE W REFLEX MICROSCOPIC - Abnormal; Notable for the following:       Result Value   APPearance TURBID (*)    Hgb urine dipstick MODERATE (*)    Protein, ur 100 (*)    Leukocytes, UA MODERATE (*)    Bacteria, UA MANY (*)    All other components within normal limits  CBC - Abnormal; Notable for the following:    WBC 15.0 (*)    RBC 3.54 (*)    Hemoglobin 9.8 (*)    HCT 28.4 (*)    All other components within normal  limits  BASIC METABOLIC PANEL - Abnormal; Notable for the following:    Sodium 132 (*)    Potassium 6.8 (*)    CO2 11 (*)    BUN 121 (*)    Creatinine, Ser 17.69 (*)    GFR calc non Af Amer 2 (*)    GFR calc Af Amer 2 (*)    Anion gap 17 (*)    All other components within normal limits  RENAL FUNCTION PANEL - Abnormal; Notable for the following:    Sodium 131 (*)    Potassium 5.9 (*)    CO2 11 (*)    Glucose, Bld 141 (*)  BUN 107 (*)    Creatinine, Ser 16.84 (*)    Calcium 8.6 (*)    Phosphorus 8.2 (*)    Albumin 2.1 (*)    GFR calc non Af Amer 2 (*)    GFR calc Af Amer 3 (*)    All other components within normal limits  CK - Abnormal; Notable for the following:    Total CK 47 (*)    All other components within normal limits  CBG MONITORING, ED - Abnormal; Notable for the following:    Glucose-Capillary 116 (*)    All other components within normal limits  CBG MONITORING, ED - Abnormal; Notable for the following:    Glucose-Capillary 108 (*)    All other components within normal limits  URINE CULTURE  MAGNESIUM  RENAL FUNCTION PANEL  OCCULT BLOOD X 1 CARD TO LAB, STOOL  CBC  CBG MONITORING, ED    EKG  EKG Interpretation  Date/Time:  Saturday February 23 2017 00:00:59 EDT Ventricular Rate:  117 PR Interval:    QRS Duration: 127 QT Interval:  337 QTC Calculation: 471 R Axis:   -44 Text Interpretation:  Since last EKG, rate has increased Non-specific changes in anterior leads, likely due to RBBB No peaking of T waves Confirmed by Manjot Beumer MD, [place] [phone]) on 02/23/2017 12:56:03 AM       Radiology US Renal  Result Date: 02/23/2017 CLINICAL DATA:  Acute renal failure EXAM: RENAL / URINARY TRACT ULTRASOUND COMPLETE COMPARISON:  Renal ultrasound 09/02/2016 CT abdomen pelvis 07/28/2016 FINDINGS: Right Kidney: Length: 13.2 cm. There is severe right hydroureteronephrosis with the proximal ureter measuring up to 2.5 cm and the distal ureter measuring up 1.9 cm. No  calculus is identified. No solid renal mass. Left Kidney: The left kidney could not be visualized, as the patient was lying on his left side and could not be repositioned. Bladder: The urinary bladder is not distended. There is a masslike area of heterogeneous echotexture in the lower pelvis, measuring 9.1 x 7 x 8 cm. IMPRESSION: 1. Severe right hydroureteronephrosis. 2. Masslike area of heterogeneous echotexture at the expected location of the urinary bladder. Further evaluation with CT or MRI of the abdomen and pelvis is recommended. Electronically Signed   By: Ulyses Jarred [name]   On: 02/23/2017 01:57    Procedures Procedures (including critical care time)  Medications Ordered in ED Medications  lidocaine (XYLOCAINE) 2 % jelly 1 application (not administered)  sodium bicarbonate 150 mEq in sterile water 1,000 mL infusion ( Intravenous New Bag/Given 02/23/17 0013)  cefTRIAXone (ROCEPHIN) 2 g in dextrose 5 % 50 mL IVPB (2 g Intravenous New Bag/Given 02/23/17 0222)  fentaNYL (SUBLIMAZE) injection 50 mcg (50 mcg Intravenous Given 02/22/17 2139)  0.9 %  sodium chloride infusion ( Intravenous Stopped 02/23/17 0009)  calcium gluconate 1 g in sodium chloride 0.9 % 100 mL IVPB (0 g Intravenous Stopped 02/23/17 0010)  insulin aspart (novoLOG) injection 5 Units (5 Units Intravenous Given 02/22/17 2332)  dextrose 50 % solution 50 mL (50 mLs Intravenous Given 02/22/17 2332)  albuterol (PROVENTIL,VENTOLIN) solution continuous neb (10 mg/hr Nebulization Given 02/22/17 2254)  sodium chloride 0.9 % bolus 1,000 mL (0 mLs Intravenous Stopped 02/23/17 0140)    Emergency Ultrasound Study:   Angiocath insertion Performed by: Evonnie Pat Consent: Verbal consent/emergent consent obtained. Risks and benefits: risks, benefits and alternatives were discussed Immediately prior to procedure the correct patient, procedure, equipment, support staff and site/side marked as needed.  Indication: difficult IV  access  Preparation: Patient was prepped and draped in the usual sterile fashion. Sterile gel was used for this procedure and the ultrasound probe was sterilized prior to use. Vein Location: Right forearm vein was visualized during assessment for potential access sites and was found to be patent/ easily compressed with linear ultrasound.  The needle was visualized with real-time ultrasound and guided into the vein. Gauge: 20  Image saved and stored.  Normal blood return.   Patient tolerance: Patient tolerated the procedure well with no immediate complications.    Emergency Ultrasound Study:   Angiocath insertion Performed by: Evonnie Pat Consent: Verbal consent/emergent consent obtained. Risks and benefits: risks, benefits and alternatives were discussed Immediately prior to procedure the correct patient, procedure, equipment, support staff and site/side marked as needed.  Indication: difficult IV access Preparation: Patient was prepped and draped in the usual sterile fashion. Sterile gel was used for this procedure and the ultrasound probe was sterilized prior to use. Vein Location: Left forearm vein was visualized during assessment for potential access sites and was found to be patent/ easily compressed with linear ultrasound.  The needle was visualized with real-time ultrasound and guided into the vein. Gauge: 20  Image saved and stored.  Normal blood return.   Patient tolerance: Patient tolerated the procedure well with no immediate complications.   CRITICAL CARE Performed by: Evonnie Pat   Total critical care time: 45 minutes  Critical care time was exclusive of separately billable procedures and treating other patients.  Critical care was necessary to treat or prevent imminent or life-threatening deterioration.  Critical care was time spent personally by me on the following activities: development of treatment plan with patient and/or surrogate as well as nursing,  discussions with consultants, evaluation of patient's response to treatment, examination of patient, obtaining history from patient or surrogate, ordering and performing treatments and interventions, ordering and review of laboratory studies, ordering and review of radiographic studies, pulse oximetry and re-evaluation of patient's condition.     Initial Impression / Assessment and Plan / ED Course  I have reviewed the triage vital signs and the nursing notes.  Pertinent labs & imaging results that were available during my care of the patient were reviewed by me and considered in my medical decision making (see chart for details).     83 yo M with h/o urinary retention, prostate issues here with decreased UOP. On arrival, VSS. Exam as above. Initial eval delayed 2/2 difficulty with IV access so PIV placed by myself x 2. Of note, bladder scan with <100 cc in bladder. Major concern is decreased UOP 2/2 AKI 2/2 decreased PO intake. Must also consider UTI, though no fever or signs of sepsis and this may be 2/2 decreased urine flow. Will check labs, obtain UA.  Labs as above, c/f acute renal failure. He has marked hyperkalemia, acidemia, and uremia. Leukocytosis noted as well and UA returned with +UTI. Will start Rocephin, send UCx, and pt temporized with insulin, calcium, albuterol. Bicarb gtt started. Nephrology consulted. Hospitalist consulted. Will continue IVF, temporizing K, and admit to [facility]. Family updated.  Final Clinical Impressions(s) / ED Diagnoses   Final diagnoses:  AKI (acute kidney injury) ([place])  Hyperkalemia  Acidemia  Uremia  Other elevated white blood cell (WBC) count  Acute cystitis with hematuria    New Prescriptions New Prescriptions   No medications on file     Duffy Bruce, MD 02/23/17 [phone]

## 2017-02-22 NOTE — ED Notes (Signed)
Bed: YY34 Expected date:  Expected time:  Means of arrival:  Comments: Hold

## 2017-02-22 NOTE — ED Notes (Signed)
Attempted to start IV for meds ad blood draws by two staff , unsuccessful, to consult IV Team

## 2017-02-23 ENCOUNTER — Emergency Department (HOSPITAL_COMMUNITY): Payer: Medicare Other

## 2017-02-23 ENCOUNTER — Inpatient Hospital Stay (HOSPITAL_COMMUNITY): Payer: Medicare Other

## 2017-02-23 ENCOUNTER — Encounter (HOSPITAL_COMMUNITY): Payer: Self-pay | Admitting: Family Medicine

## 2017-02-23 DIAGNOSIS — N182 Chronic kidney disease, stage 2 (mild): Secondary | ICD-10-CM

## 2017-02-23 DIAGNOSIS — N390 Urinary tract infection, site not specified: Secondary | ICD-10-CM | POA: Diagnosis not present

## 2017-02-23 DIAGNOSIS — Z960 Presence of urogenital implants: Secondary | ICD-10-CM | POA: Diagnosis present

## 2017-02-23 DIAGNOSIS — Z515 Encounter for palliative care: Secondary | ICD-10-CM | POA: Diagnosis not present

## 2017-02-23 DIAGNOSIS — Z951 Presence of aortocoronary bypass graft: Secondary | ICD-10-CM | POA: Diagnosis not present

## 2017-02-23 DIAGNOSIS — N183 Chronic kidney disease, stage 3 (moderate): Secondary | ICD-10-CM | POA: Diagnosis present

## 2017-02-23 DIAGNOSIS — C679 Malignant neoplasm of bladder, unspecified: Secondary | ICD-10-CM | POA: Diagnosis not present

## 2017-02-23 DIAGNOSIS — B965 Pseudomonas (aeruginosa) (mallei) (pseudomallei) as the cause of diseases classified elsewhere: Secondary | ICD-10-CM | POA: Diagnosis present

## 2017-02-23 DIAGNOSIS — I25810 Atherosclerosis of coronary artery bypass graft(s) without angina pectoris: Secondary | ICD-10-CM

## 2017-02-23 DIAGNOSIS — C61 Malignant neoplasm of prostate: Secondary | ICD-10-CM | POA: Diagnosis present

## 2017-02-23 DIAGNOSIS — Z87891 Personal history of nicotine dependence: Secondary | ICD-10-CM | POA: Diagnosis not present

## 2017-02-23 DIAGNOSIS — I2510 Atherosclerotic heart disease of native coronary artery without angina pectoris: Secondary | ICD-10-CM | POA: Diagnosis present

## 2017-02-23 DIAGNOSIS — E872 Acidosis, unspecified: Secondary | ICD-10-CM

## 2017-02-23 DIAGNOSIS — D62 Acute posthemorrhagic anemia: Secondary | ICD-10-CM | POA: Diagnosis present

## 2017-02-23 DIAGNOSIS — N179 Acute kidney failure, unspecified: Principal | ICD-10-CM

## 2017-02-23 DIAGNOSIS — E43 Unspecified severe protein-calorie malnutrition: Secondary | ICD-10-CM | POA: Diagnosis not present

## 2017-02-23 DIAGNOSIS — Z87440 Personal history of urinary (tract) infections: Secondary | ICD-10-CM | POA: Diagnosis not present

## 2017-02-23 DIAGNOSIS — E875 Hyperkalemia: Secondary | ICD-10-CM

## 2017-02-23 DIAGNOSIS — E860 Dehydration: Secondary | ICD-10-CM | POA: Diagnosis present

## 2017-02-23 DIAGNOSIS — R310 Gross hematuria: Secondary | ICD-10-CM | POA: Diagnosis present

## 2017-02-23 DIAGNOSIS — N202 Calculus of kidney with calculus of ureter: Secondary | ICD-10-CM | POA: Diagnosis present

## 2017-02-23 DIAGNOSIS — K921 Melena: Secondary | ICD-10-CM | POA: Diagnosis present

## 2017-02-23 DIAGNOSIS — Z87442 Personal history of urinary calculi: Secondary | ICD-10-CM | POA: Diagnosis not present

## 2017-02-23 DIAGNOSIS — Z7189 Other specified counseling: Secondary | ICD-10-CM | POA: Diagnosis not present

## 2017-02-23 DIAGNOSIS — R000 Tachycardia, unspecified: Secondary | ICD-10-CM | POA: Diagnosis present

## 2017-02-23 DIAGNOSIS — N3001 Acute cystitis with hematuria: Secondary | ICD-10-CM | POA: Diagnosis not present

## 2017-02-23 DIAGNOSIS — Z8546 Personal history of malignant neoplasm of prostate: Secondary | ICD-10-CM | POA: Diagnosis not present

## 2017-02-23 DIAGNOSIS — N136 Pyonephrosis: Secondary | ICD-10-CM | POA: Diagnosis present

## 2017-02-23 DIAGNOSIS — I129 Hypertensive chronic kidney disease with stage 1 through stage 4 chronic kidney disease, or unspecified chronic kidney disease: Secondary | ICD-10-CM | POA: Diagnosis present

## 2017-02-23 DIAGNOSIS — Z8551 Personal history of malignant neoplasm of bladder: Secondary | ICD-10-CM | POA: Diagnosis not present

## 2017-02-23 HISTORY — PX: IR GENERIC HISTORICAL: IMG1180011

## 2017-02-23 LAB — RENAL FUNCTION PANEL
ALBUMIN: 2.3 g/dL — AB (ref 3.5–5.0)
ALBUMIN: 1.8 g/dL — AB (ref 3.5–5.0)
ANION GAP: 19 — AB (ref 5–15)
Albumin: 2.1 g/dL — ABNORMAL LOW (ref 3.5–5.0)
Anion gap: 15 (ref 5–15)
Anion gap: 19 — ABNORMAL HIGH (ref 5–15)
BUN: 101 mg/dL — AB (ref 6–20)
BUN: 107 mg/dL — ABNORMAL HIGH (ref 6–20)
BUN: 122 mg/dL — AB (ref 6–20)
CALCIUM: 9 mg/dL (ref 8.9–10.3)
CALCIUM: 8.6 mg/dL — AB (ref 8.9–10.3)
CHLORIDE: 105 mmol/L (ref 101–111)
CO2: 11 mmol/L — ABNORMAL LOW (ref 22–32)
CO2: 11 mmol/L — ABNORMAL LOW (ref 22–32)
CO2: 12 mmol/L — ABNORMAL LOW (ref 22–32)
CREATININE: 16.84 mg/dL — AB (ref 0.61–1.24)
Calcium: 8.6 mg/dL — ABNORMAL LOW (ref 8.9–10.3)
Chloride: 105 mmol/L (ref 101–111)
Chloride: 105 mmol/L (ref 101–111)
Creatinine, Ser: 17.05 mg/dL — ABNORMAL HIGH (ref 0.61–1.24)
Creatinine, Ser: 16.65 mg/dL — ABNORMAL HIGH (ref 0.61–1.24)
GFR calc Af Amer: 3 mL/min — ABNORMAL LOW (ref >60)
GFR calc Af Amer: 3 mL/min — ABNORMAL LOW (ref >60)
GFR calc non Af Amer: 2 mL/min — ABNORMAL LOW (ref >60)
GFR, EST AFRICAN AMERICAN: 3 mL/min — AB (ref >60)
GFR, EST NON AFRICAN AMERICAN: 2 mL/min — AB (ref >60)
GFR, EST NON AFRICAN AMERICAN: 2 mL/min — AB (ref >60)
GLUCOSE: 104 mg/dL — AB (ref 65–99)
GLUCOSE: 85 mg/dL (ref 65–99)
Glucose, Bld: 141 mg/dL — ABNORMAL HIGH (ref 65–99)
PHOSPHORUS: 7.3 mg/dL — AB (ref 2.5–4.6)
PHOSPHORUS: 7.6 mg/dL — AB (ref 2.5–4.6)
Phosphorus: 8.2 mg/dL — ABNORMAL HIGH (ref 2.5–4.6)
Potassium: 5.9 mmol/L — ABNORMAL HIGH (ref 3.5–5.1)
Potassium: 5.9 mmol/L — ABNORMAL HIGH (ref 3.5–5.1)
Potassium: 7 mmol/L (ref 3.5–5.1)
SODIUM: 135 mmol/L (ref 135–145)
SODIUM: 136 mmol/L (ref 135–145)
Sodium: 131 mmol/L — ABNORMAL LOW (ref 135–145)

## 2017-02-23 LAB — BASIC METABOLIC PANEL
Anion gap: 17 — ABNORMAL HIGH (ref 5–15)
BUN: 116 mg/dL — AB (ref 6–20)
CO2: 14 mmol/L — AB (ref 22–32)
Calcium: 8.7 mg/dL — ABNORMAL LOW (ref 8.9–10.3)
Chloride: 104 mmol/L (ref 101–111)
Creatinine, Ser: 17.24 mg/dL — ABNORMAL HIGH (ref 0.61–1.24)
GFR calc Af Amer: 2 mL/min — ABNORMAL LOW (ref >60)
GFR calc non Af Amer: 2 mL/min — ABNORMAL LOW (ref >60)
Glucose, Bld: 97 mg/dL (ref 65–99)
POTASSIUM: 6.7 mmol/L — AB (ref 3.5–5.1)
SODIUM: 135 mmol/L (ref 135–145)

## 2017-02-23 LAB — ABO/RH: ABO/RH(D): A POS

## 2017-02-23 LAB — URINALYSIS, ROUTINE W REFLEX MICROSCOPIC
BILIRUBIN URINE: NEGATIVE
Glucose, UA: NEGATIVE mg/dL
KETONES UR: NEGATIVE mg/dL
NITRITE: NEGATIVE
PH: 7 (ref 5.0–8.0)
Protein, ur: 100 mg/dL — AB
Specific Gravity, Urine: 1.023 (ref 1.005–1.030)
Squamous Epithelial / LPF: NONE SEEN

## 2017-02-23 LAB — CBG MONITORING, ED
Glucose-Capillary: 108 mg/dL — ABNORMAL HIGH (ref 65–99)
Glucose-Capillary: 116 mg/dL — ABNORMAL HIGH (ref 65–99)

## 2017-02-23 LAB — CBC
HEMATOCRIT: 24.8 % — AB (ref 39.0–52.0)
HEMOGLOBIN: 8.4 g/dL — AB (ref 13.0–17.0)
MCH: 27.8 pg (ref 26.0–34.0)
MCHC: 33.9 g/dL (ref 30.0–36.0)
MCV: 82.1 fL (ref 78.0–100.0)
Platelets: 303 10*3/uL (ref 150–400)
RBC: 3.02 MIL/uL — AB (ref 4.22–5.81)
RDW: 15.8 % — ABNORMAL HIGH (ref 11.5–15.5)
WBC: 14.8 10*3/uL — ABNORMAL HIGH (ref 4.0–10.5)

## 2017-02-23 LAB — TYPE AND SCREEN
ABO/RH(D): A POS
ANTIBODY SCREEN: NEGATIVE

## 2017-02-23 LAB — GLUCOSE, CAPILLARY: GLUCOSE-CAPILLARY: 107 mg/dL — AB (ref 65–99)

## 2017-02-23 LAB — CK: CK TOTAL: 47 U/L — AB (ref 49–397)

## 2017-02-23 LAB — MAGNESIUM: Magnesium: 2.4 mg/dL (ref 1.7–2.4)

## 2017-02-23 LAB — PROTIME-INR
INR: 1.55
PROTHROMBIN TIME: 18.7 s — AB (ref 11.4–15.2)

## 2017-02-23 LAB — MRSA PCR SCREENING: MRSA BY PCR: NEGATIVE

## 2017-02-23 MED ORDER — ADULT MULTIVITAMIN W/MINERALS CH
1.0000 | ORAL_TABLET | Freq: Every day | ORAL | Status: DC
  Administered 2017-02-24 – 2017-02-26 (×3): 1 via ORAL
  Filled 2017-02-23 (×4): qty 1

## 2017-02-23 MED ORDER — MIDAZOLAM HCL 2 MG/2ML IJ SOLN
INTRAMUSCULAR | Status: AC | PRN
  Administered 2017-02-23: 1 mg via INTRAVENOUS
  Administered 2017-02-23: 0.5 mg via INTRAVENOUS

## 2017-02-23 MED ORDER — SODIUM POLYSTYRENE SULFONATE 15 GM/60ML PO SUSP
30.0000 g | Freq: Once | ORAL | Status: AC
  Administered 2017-02-23: 30 g via ORAL
  Filled 2017-02-23: qty 120

## 2017-02-23 MED ORDER — INSULIN ASPART 100 UNIT/ML IV SOLN
10.0000 [IU] | Freq: Once | INTRAVENOUS | Status: AC
  Administered 2017-02-23: 10 [IU] via INTRAVENOUS

## 2017-02-23 MED ORDER — FENTANYL CITRATE (PF) 100 MCG/2ML IJ SOLN
INTRAMUSCULAR | Status: AC | PRN
  Administered 2017-02-23: 25 ug via INTRAVENOUS

## 2017-02-23 MED ORDER — TRAMADOL HCL 50 MG PO TABS
50.0000 mg | ORAL_TABLET | Freq: Two times a day (BID) | ORAL | Status: DC | PRN
  Administered 2017-02-24 – 2017-02-25 (×3): 50 mg via ORAL
  Filled 2017-02-23 (×4): qty 1

## 2017-02-23 MED ORDER — ACETAMINOPHEN 325 MG PO TABS
650.0000 mg | ORAL_TABLET | Freq: Four times a day (QID) | ORAL | Status: DC | PRN
  Administered 2017-02-23 – 2017-02-24 (×2): 650 mg via ORAL
  Filled 2017-02-23 (×2): qty 2

## 2017-02-23 MED ORDER — ROSUVASTATIN CALCIUM 10 MG PO TABS: 10.0000 mg | ORAL_TABLET | Freq: Every day | ORAL | Status: DC

## 2017-02-23 MED ORDER — DEXTROSE 50 % IV SOLN
1.0000 | Freq: Once | INTRAVENOUS | Status: AC
  Administered 2017-02-23: 50 mL via INTRAVENOUS
  Filled 2017-02-23: qty 50

## 2017-02-23 MED ORDER — FENTANYL CITRATE (PF) 100 MCG/2ML IJ SOLN
25.0000 ug | INTRAMUSCULAR | Status: DC | PRN
  Administered 2017-02-23: 50 ug via INTRAVENOUS
  Administered 2017-02-24 – 2017-02-25 (×4): 25 ug via INTRAVENOUS
  Filled 2017-02-23 (×5): qty 2

## 2017-02-23 MED ORDER — IOPAMIDOL (ISOVUE-300) INJECTION 61%
INTRAVENOUS | Status: AC
  Administered 2017-02-23: 25 mL
  Filled 2017-02-23: qty 50

## 2017-02-23 MED ORDER — SODIUM CHLORIDE 0.9 % IV SOLN
INTRAVENOUS | Status: DC
  Administered 2017-02-23: 75 mL/h via INTRAVENOUS

## 2017-02-23 MED ORDER — DEXTROSE 5 % IV SOLN: 2.0000 g | Freq: Once | INTRAVENOUS | Status: DC

## 2017-02-23 MED ORDER — DEXTROSE 5 % IV SOLN
2.0000 g | Freq: Once | INTRAVENOUS | Status: AC
  Administered 2017-02-23: 2 g via INTRAVENOUS
  Filled 2017-02-23: qty 2

## 2017-02-23 MED ORDER — LIDOCAINE HCL 1 % IJ SOLN
INTRAMUSCULAR | Status: AC | PRN
  Administered 2017-02-23: 20 mL

## 2017-02-23 MED ORDER — FENTANYL CITRATE (PF) 100 MCG/2ML IJ SOLN
INTRAMUSCULAR | Status: AC
  Filled 2017-02-23: qty 4

## 2017-02-23 MED ORDER — DEXTROSE 5 % IV SOLN
500.0000 mg | INTRAVENOUS | Status: DC
  Administered 2017-02-25: 500 mg via INTRAVENOUS
  Filled 2017-02-23 (×2): qty 0.5

## 2017-02-23 MED ORDER — MIDAZOLAM HCL 2 MG/2ML IJ SOLN
INTRAMUSCULAR | Status: AC
  Filled 2017-02-23: qty 4

## 2017-02-23 MED ORDER — LIDOCAINE HCL (PF) 1 % IJ SOLN
INTRAMUSCULAR | Status: AC
  Filled 2017-02-23: qty 30

## 2017-02-23 MED ORDER — CEFTRIAXONE SODIUM 2 G IJ SOLR
2.0000 g | Freq: Once | INTRAMUSCULAR | Status: AC
  Administered 2017-02-23: 2 g via INTRAVENOUS
  Filled 2017-02-23: qty 2

## 2017-02-23 NOTE — Procedures (Signed)
Interventional Radiology Procedure Note  Procedure: Placement of bilateral 39F PCNs for bilateral obstructed hydronephrosis with pyonephrosis and AKI.  Urine is frankly purulent bilaterally.  Sample sent for culture.   Complications: None  Estimated Blood Loss: None  Recommendations: - Cx are pending - Follow drain output   Signed,  Criselda Peaches, MD

## 2017-02-23 NOTE — Progress Notes (Signed)
[facility] PROGRESS NOTE  [name] BDZ:[id number] DOB: 10-25-1931 DOA: 02/22/2017   PCP: [name], MD     Assessment/Plan: Principal Problem:   AKI (acute kidney injury) ([place]) Active Problems:   Benign essential HTN   Coronary artery disease involving coronary bypass graft of native heart without angina pectoris   CKD (chronic kidney disease), stage II   Normocytic anemia   Acute lower UTI   Bladder cancer ([place])   Hematochezia   Prostate CA ([place])  [name] with and extensive PMH significant for HTN, CAD s/p CABG, prostate cancer s/p multiple TURBT with recurrent UTI's, as well as bladder cancer (receives Urologic care through the [facility] and has had "scraping of the bladder" several times) who was brought to [facility] with a 2 week history of malaise, anorexia, and FTT.  His wife reports that he had diarrhea for the last week and also had some blood in his stools.Labs upon arrival were notable for a potassium of 6.8 and a BUN/Cr of 121/17.69. Previously creatinine was 1.73.   nephrology consulted to further evaluate and manage his AKI/CKD and hyperkalemia.  urology consulted for Foley placement. Patient is followed by Caren Macadam, MD , urologist at [facility] and plan  AKI/CKD- in setting of volume depletion and possible bladder outlet obstruction in the setting of prostate and bladder cancer. Continue IVF's , urology consulted for Foley placement.   patient declined cystoscopy in November of last year.   no significant improvement in renal function.   CT stone protocol done.  May need IR consult for bilateral perc nephrostomy  Hyperkalemia- due to #1 and metabolic acidosis.  Treated with IV calcium, insulin/D50 and now on bicarb drip.  Repeat labs and hopefully we can avoid HD with conservative therapy.  Per ED MD no EKG changes consistent with Hyperkalemia. We'll give 1 more dose of Kayexalate today  Pyuria/hematuria- will need to send  Urine for Cx and sensitivity, continue cefepime  Bladder cancer,Prostate cancer-followed at [facility]  Anemia- normocytic with h/o bladder and prostate cancer as well as ckd stage 2-3 and gross hematuria and hematochezia. Hemoglobin at baseline around 9.5, check stool guaiac. CT abdomen shows bilateral hydronephrosis and hydroureter, likely causing abdominal pain. Bladder mass is progressing, likely invasion into the adjacent sigmoid colon. Will consider palliative care consult for goals of care     DVT prophylaxsis SCDs  Code Status:  Full code    Family Communication: Discussed in detail with the patient, all imaging results, lab results explained to the patient   Disposition Plan:  Neurology consult, nephrology consult      Consultants:  Urology  Nephrology  Palliative care  Procedures:  None  Antibiotics: Anti-infectives    Start     Dose/Rate Route Frequency Ordered Stop   02/24/17 0600  ceFEPIme (MAXIPIME) 500 mg in dextrose 5 % 50 mL IVPB     500 mg 100 mL/hr over 30 Minutes Intravenous Every 24 hours 02/23/17 0447     02/23/17 0500  ceFEPIme (MAXIPIME) 2 g in dextrose 5 % 50 mL IVPB     2 g 100 mL/hr over 30 Minutes Intravenous  Once 02/23/17 0445 02/23/17 0618   02/23/17 0230  cefTRIAXone (ROCEPHIN) 2 g in dextrose 5 % 50 mL IVPB     2 g 100 mL/hr over 30 Minutes Intravenous  Once 02/23/17 0217 02/23/17 0329         HPI/Subjective:  Very lethargic , but arousable , denies main  Objective: Vitals:   02/23/17 0435 02/23/17 0500 02/23/17 0530 02/23/17 0700  BP: (!) 147/93 127/82 116/76 (!) 143/79  Pulse: (!) 103 (!) 101 100   Resp: 20 (!) 26 (!) 22   Temp:      TempSrc:      SpO2: 97% 99% 96%   Weight: 107 kg (235 lb 14.3 oz)       Intake/Output Summary (Last 24 hours) at 02/23/17 8099 Last data filed at 02/23/17 0329  Gross per 24 hour  Intake             4270 ml  Output                0 ml  Net             4270 ml     Exam:  Examination:  General exam: Appears calm and comfortable  Respiratory system: Clear to auscultation. Respiratory effort normal. Cardiovascular system: S1 & S2 heard, RRR. No JVD, murmurs, rubs, gallops or clicks. No pedal edema. Gastrointestinal system: Abdomen is nondistended, soft and nontender. No organomegaly or masses felt. Normal bowel sounds heard. Central nervous system: Alert and oriented. No focal neurological deficits. Extremities: Symmetric 5 x 5 power. Skin: No rashes, lesions or ulcers Psychiatry: Judgement and insight appear normal. Mood & affect appropriate.     Data Reviewed: I have personally reviewed following labs and imaging studies  Micro Results No results found for this or any previous visit (from the past 240 hour(s)).  Radiology Reports US Renal  Result Date: 02/23/2017 CLINICAL DATA:  Acute renal failure EXAM: RENAL / URINARY TRACT ULTRASOUND COMPLETE COMPARISON:  Renal ultrasound 09/02/2016 CT abdomen pelvis 07/28/2016 FINDINGS: Right Kidney: Length: 13.2 cm. There is severe right hydroureteronephrosis with the proximal ureter measuring up to 2.5 cm and the distal ureter measuring up 1.9 cm. No calculus is identified. No solid renal mass. Left Kidney: The left kidney could not be visualized, as the patient was lying on his left side and could not be repositioned. Bladder: The urinary bladder is not distended. There is a masslike area of heterogeneous echotexture in the lower pelvis, measuring 9.1 x 7 x 8 cm. IMPRESSION: 1. Severe right hydroureteronephrosis. 2. Masslike area of heterogeneous echotexture at the expected location of the urinary bladder. Further evaluation with CT or MRI of the abdomen and pelvis is recommended. Electronically Signed   By: Ulyses Jarred [name]   On: 02/23/2017 01:57   Ct Renal Stone Study  Result Date: 02/23/2017 CLINICAL DATA:  Patient is dehydrated. No p.o. intake for 2 weeks. No urinary output. Penile infection and  bleeding. History of stones. Bladder cancer. Recurrent prostate cancer. EXAM: CT ABDOMEN AND PELVIS WITHOUT CONTRAST TECHNIQUE: Multidetector CT imaging of the abdomen and pelvis was performed following the standard protocol without IV contrast. COMPARISON:  07/28/2016 FINDINGS: Lower chest: Small bilateral pleural effusions with basilar atelectasis. Fibrosis in the lung bases. Postoperative changes in the mediastinum consistent with bypass grafts. Hepatobiliary: Examination is limited due to motion artifact. No focal liver lesions appreciated. Gallbladder and bile ducts are grossly unremarkable. Pancreas: Unremarkable. No pancreatic ductal dilatation or surrounding inflammatory changes. Spleen: Normal in size without focal abnormality. Adrenals/Urinary Tract: No adrenal gland nodules. There is prominent bilateral hydronephrosis and hydroureter. Hydronephrosis is progressing since prior study. There is a 9 mm stone in the distal right ureter. On the previous study, this stone was in the kidney. There is a  heterogeneous mass involving the bladder with infiltration and a surrounding tissues. The mass may be contributing to bilateral ureteral obstruction. Difficult to separate the bladder from the mass on noncontrast imaging. Altogether, the bladder/mass measures about 6.6 x 9 cm. There is progression since the previous study. Stomach/Bowel: Stomach and small bowel are decompressed. Stool-filled colon without abnormal distention. Diverticulosis of the sigmoid colon with probable muscular hypertrophy. Difficult the separated portion of the sigmoid colon from the bladder mass and involvement of the colon is suspected. Appendix is not identified. Vascular/Lymphatic: Aortic atherosclerosis. No enlarged abdominal or pelvic lymph nodes. Reproductive: Postoperative changes in the prostate gland. Penile prosthesis. Other: Small left inguinal hernia containing fat. No free air or free fluid in the abdomen. Musculoskeletal:  Degenerative changes in the spine and hips. No destructive bone lesions. IMPRESSION: Prominent bilateral hydronephrosis and hydroureter. There is a 9 mm stone in the distal right ureter. The bladder wall is irregular with prominent soft tissue, suggesting an infiltrating mass. This may be causing obstruction of the left ureter. The mass is progressing since previous study and now demonstrates likely invasion into the adjacent sigmoid colon. Prostate gland is surgically absent. Penile prosthesis. Small bilateral pleural effusions with atelectasis or consolidation in the lung bases. Aortic atherosclerosis. Small left inguinal hernia containing fat. Electronically Signed   By: Lucienne Capers [name]   On: 02/23/2017 06:18     CBC  Recent Labs Lab 02/22/17 2134 02/23/17 0450  WBC 15.0* 14.8*  HGB 9.8* 8.4*  HCT 28.4* 24.8*  PLT 348 303  MCV 80.2 82.1  MCH 27.7 27.8  MCHC 34.5 33.9  RDW 15.3 15.8*    Chemistries   Recent Labs Lab 02/22/17 2134 02/23/17 0055 02/23/17 0450  NA 132* 131* 135  K 6.8* 5.9* 5.9*  CL 104 105 105  CO2 11* 11* 11*  GLUCOSE 90 141* 104*  BUN 121* 107* 101*  CREATININE 17.69* 16.84* 17.05*  CALCIUM 9.3 8.6* 9.0  MG  --  2.4  --    ------------------------------------------------------------------------------------------------------------------ estimated creatinine clearance is 4.3 mL/min (A) (by C-G formula based on SCr of 17.05 mg/dL (H)). ------------------------------------------------------------------------------------------------------------------ No results for input(s): HGBA1C in the last 72 hours. ------------------------------------------------------------------------------------------------------------------ No results for input(s): CHOL, HDL, LDLCALC, TRIG, CHOLHDL, LDLDIRECT in the last 72 hours. ------------------------------------------------------------------------------------------------------------------ No results for input(s): TSH,  T4TOTAL, T3FREE, THYROIDAB in the last 72 hours.  Invalid input(s): FREET3 ------------------------------------------------------------------------------------------------------------------ No results for input(s): VITAMINB12, FOLATE, FERRITIN, TIBC, IRON, RETICCTPCT in the last 72 hours.  Coagulation profile No results for input(s): INR, PROTIME in the last 168 hours.  No results for input(s): DDIMER in the last 72 hours.  Cardiac Enzymes No results for input(s): CKMB, TROPONINI, MYOGLOBIN in the last 168 hours.  Invalid input(s): CK ------------------------------------------------------------------------------------------------------------------ Invalid input(s): POCBNP   CBG:  Recent Labs Lab 02/23/17 0007 02/23/17 0132 02/23/17 0815  GLUCAP 116* 108* 107*       Studies: US Renal  Result Date: 02/23/2017 CLINICAL DATA:  Acute renal failure EXAM: RENAL / URINARY TRACT ULTRASOUND COMPLETE COMPARISON:  Renal ultrasound 09/02/2016 CT abdomen pelvis 07/28/2016 FINDINGS: Right Kidney: Length: 13.2 cm. There is severe right hydroureteronephrosis with the proximal ureter measuring up to 2.5 cm and the distal ureter measuring up 1.9 cm. No calculus is identified. No solid renal mass. Left Kidney: The left kidney could not be visualized, as the patient was lying on his left side and could not be repositioned. Bladder: The urinary bladder is not distended. There is a masslike area of  heterogeneous echotexture in the lower pelvis, measuring 9.1 x 7 x 8 cm. IMPRESSION: 1. Severe right hydroureteronephrosis. 2. Masslike area of heterogeneous echotexture at the expected location of the urinary bladder. Further evaluation with CT or MRI of the abdomen and pelvis is recommended. Electronically Signed   By: Ulyses Jarred [name]   On: 02/23/2017 01:57   Ct Renal Stone Study  Result Date: 02/23/2017 CLINICAL DATA:  Patient is dehydrated. No p.o. intake for 2 weeks. No urinary output. Penile  infection and bleeding. History of stones. Bladder cancer. Recurrent prostate cancer. EXAM: CT ABDOMEN AND PELVIS WITHOUT CONTRAST TECHNIQUE: Multidetector CT imaging of the abdomen and pelvis was performed following the standard protocol without IV contrast. COMPARISON:  07/28/2016 FINDINGS: Lower chest: Small bilateral pleural effusions with basilar atelectasis. Fibrosis in the lung bases. Postoperative changes in the mediastinum consistent with bypass grafts. Hepatobiliary: Examination is limited due to motion artifact. No focal liver lesions appreciated. Gallbladder and bile ducts are grossly unremarkable. Pancreas: Unremarkable. No pancreatic ductal dilatation or surrounding inflammatory changes. Spleen: Normal in size without focal abnormality. Adrenals/Urinary Tract: No adrenal gland nodules. There is prominent bilateral hydronephrosis and hydroureter. Hydronephrosis is progressing since prior study. There is a 9 mm stone in the distal right ureter. On the previous study, this stone was in the kidney. There is a heterogeneous mass involving the bladder with infiltration and a surrounding tissues. The mass may be contributing to bilateral ureteral obstruction. Difficult to separate the bladder from the mass on noncontrast imaging. Altogether, the bladder/mass measures about 6.6 x 9 cm. There is progression since the previous study. Stomach/Bowel: Stomach and small bowel are decompressed. Stool-filled colon without abnormal distention. Diverticulosis of the sigmoid colon with probable muscular hypertrophy. Difficult the separated portion of the sigmoid colon from the bladder mass and involvement of the colon is suspected. Appendix is not identified. Vascular/Lymphatic: Aortic atherosclerosis. No enlarged abdominal or pelvic lymph nodes. Reproductive: Postoperative changes in the prostate gland. Penile prosthesis. Other: Small left inguinal hernia containing fat. No free air or free fluid in the abdomen.  Musculoskeletal: Degenerative changes in the spine and hips. No destructive bone lesions. IMPRESSION: Prominent bilateral hydronephrosis and hydroureter. There is a 9 mm stone in the distal right ureter. The bladder wall is irregular with prominent soft tissue, suggesting an infiltrating mass. This may be causing obstruction of the left ureter. The mass is progressing since previous study and now demonstrates likely invasion into the adjacent sigmoid colon. Prostate gland is surgically absent. Penile prosthesis. Small bilateral pleural effusions with atelectasis or consolidation in the lung bases. Aortic atherosclerosis. Small left inguinal hernia containing fat. Electronically Signed   By: Lucienne Capers [name]   On: 02/23/2017 06:18      No results found for: HGBA1C Lab Results  Component Value Date   CREATININE 17.05 (H) 02/23/2017       Scheduled Meds: . [START ON 02/24/2017] ceFEPime (MAXIPIME) IV  500 mg Intravenous Q24H  . multivitamin with minerals  1 tablet Oral Daily  . rosuvastatin  10 mg Oral Daily  . sodium polystyrene  30 g Oral Once   Continuous Infusions: . sodium chloride    .  sodium bicarbonate (isotonic) infusion in sterile water 125 mL/hr at 02/23/17 0013     LOS: 0 days    Time spent: >30 MINS    [facility]  [facility] Pager [phone]. If 7PM-7AM, please contact night-coverage at [email or web address], password [facility] 02/23/2017, 8:21 AM  LOS: 0 days

## 2017-02-23 NOTE — ED Provider Notes (Signed)
  Physical Exam  BP 140/84 (BP Location: Left Arm)   Pulse 98   Temp 97.6 F (36.4 C) (Oral)   Resp 20   SpO2 94%   Physical Exam  ED Course  Procedures  MDM  RN asked me to put admit orders..  Apparently hospitalist unable to place admit orders. Basic orders placed.  [facility] admitting team can place the antibiotics / pain meds etc upon patient's arrival.      Varney Biles, MD 02/23/17 0422

## 2017-02-23 NOTE — Consult Note (Signed)
Reason for Consult: AKI/CKD and hyperkalemia Referring Physician:  [name], MD  [name] is an 83 y.o. male.  HPI: Mr. Rowland is an 83yo AAM with and extensive PMH significant for HTN, CAD s/p CABG, prostate cancer s/p multiple TURBT with recurrent UTI's, as well as bladder cancer (receives Urologic care through the [place] [facility] and has had "scraping of the bladder" several times) who was brought to [facility] with a 2 week history of malaise, anorexia, and FTT.  His wife reports that he had diarrhea for the last week and also had some blood in his stools.  He also has had decreased UOP and as well as dysuria, pyuria, and a purulent drainage (wife reports a yellow-bloody discharge from his penis).  More recently he has had decreased po intake and abdominal pain.  Labs upon arrival were notable for a potassium of 6.8 and a BUN/Cr of 121/17.69.  We were consulted to further evaluate and manage his AKI/CKD and hyperkalemia.  He denies any nausea, vomiting, or chest pain.  He also denies any NSAIDs or Cox-II I's.   Of note, his wife reports that he also has a penile implant which has been a source of recurrent UTI's and that he was intially to have a cystectomy with ileostomy for his bladder cancer, however the urologist felt he was too high risk for the procedure.  He also has a history of multiple episodes of AKI/CKD related to his episodes of UTI's as well as his urinary retention.  No attempt to place a foley was performed due to his complex urologic history.  A bladder scan in the ED revealed approximately 30cc's. The trend in Scr is seen below.  Trend in Creatinine: Creatinine, Ser  Date/Time Value Ref Range Status  02/22/2017 09:34 PM 17.69 (H) 0.61 - 1.24 mg/dL Final  12/11/2016 10:20 AM 1.70 (H) 0.61 - 1.24 mg/dL Final  12/11/2016 07:43 AM 1.73 (H) 0.61 - 1.24 mg/dL Final  12/10/2016 02:39 AM 1.73 (H) 0.61 - 1.24 mg/dL Final  12/09/2016 02:56 AM 2.02 (H) 0.61 - 1.24 mg/dL Final  12/08/2016  05:54 PM 2.07 (H) 0.61 - 1.24 mg/dL Final  10/03/2016 05:30 AM 1.74 (H) 0.61 - 1.24 mg/dL Final  10/02/2016 04:53 AM 2.02 (H) 0.61 - 1.24 mg/dL Final  10/01/2016 04:18 PM 2.43 (H) 0.61 - 1.24 mg/dL Final  09/03/2016 07:33 AM 1.22 0.61 - 1.24 mg/dL Final  09/02/2016 04:25 AM 1.31 (H) 0.61 - 1.24 mg/dL Final  09/01/2016 05:24 PM 1.25 (H) 0.61 - 1.24 mg/dL Final  08/13/2016 04:55 AM 1.46 (H) 0.61 - 1.24 mg/dL Final  08/10/2016 07:12 AM 1.20 0.61 - 1.24 mg/dL Final  08/09/2016 04:29 AM 1.29 (H) 0.61 - 1.24 mg/dL Final  08/06/2016 06:24 AM 1.07 0.61 - 1.24 mg/dL Final  08/03/2016 06:41 AM 0.99 0.61 - 1.24 mg/dL Final  08/02/2016 10:42 PM 1.16 0.61 - 1.24 mg/dL Final  08/01/2016 05:25 AM 1.22 0.61 - 1.24 mg/dL Final  07/31/2016 05:35 AM 1.66 (H) 0.61 - 1.24 mg/dL Final  07/30/2016 08:01 AM 2.24 (H) 0.61 - 1.24 mg/dL Final  07/28/2016 11:57 PM 2.13 (H) 0.61 - 1.24 mg/dL Final  07/28/2016 11:29 PM 2.10 (H) 0.61 - 1.24 mg/dL Final  09/16/2015 04:38 PM 1.37 (H) 0.61 - 1.24 mg/dL Final  04/19/2015 06:45 PM 1.26 (H) 0.61 - 1.24 mg/dL Final  05/10/2013 07:28 PM 1.20 0.50 - 1.35 mg/dL Final  12/09/2012 09:50 PM 1.19 0.50 - 1.35 mg/dL Final  08/16/2012 12:55 PM 1.09 0.50 -  1.35 mg/dL Final  08/02/2012 01:50 PM 1.39 (H) 0.50 - 1.35 mg/dL Final  04/12/2011 03:49 PM 0.94 0.4 - 1.5 mg/dL Final  02/19/2011 11:54 PM 0.95 0.4 - 1.5 mg/dL Final  01/29/2011 05:18 PM 1.04 0.4 - 1.5 mg/dL Final  01/01/2011 03:20 AM 1.08 0.4 - 1.5 mg/dL Final  12/28/2010 05:45 AM 0.96 0.4 - 1.5 mg/dL Final  12/25/2010 07:10 PM 1.06 0.4 - 1.5 mg/dL Final  12/25/2010 10:29 AM 0.98 0.4 - 1.5 mg/dL Final  02/01/2009 03:34 PM 1.0 0.4 - 1.5 mg/dL Final  06/26/2008 10:00 AM 0.88  Final  05/04/2008 03:21 PM 1.09  Final  01/17/2008 03:31 AM 1.12  Final  01/16/2008 06:10 AM 1.06  Final  01/15/2008 02:37 PM 1.02  Final    PMH:   Past Medical History:  Diagnosis Date  . Bladder cancer ([place]) 05/2015  . CAD (coronary artery  disease)   . Hyperlipidemia   . Hypertension   . Kidney stones   . Prostate CA ([place])     PSH:   Past Surgical History:  Procedure Laterality Date  . CARDIAC SURGERY    . CORONARY ARTERY BYPASS GRAFT    . PENILE PROSTHESIS IMPLANT    . PROSTATE SURGERY      Allergies:  Allergies  Allergen Reactions  . Gabapentin Other (See Comments)    Shaking  . Aspirin     Causes blood in urine? Per spouse   . Other Other (See Comments)    Antihistamines-unknown reaction    Medications:   Prior to Admission medications   Medication Sig Start Date End Date Taking? Authorizing Provider  Multiple Vitamin (MULTIVITAMIN WITH MINERALS) TABS tablet Take 1 tablet by mouth daily.   Yes Historical Provider, MD  rosuvastatin (CRESTOR) 10 MG tablet Take 10 mg by mouth daily. pw 08/11/16  Yes Historical Provider, MD  traMADol (ULTRAM) 50 MG tablet Take 1 tablet (50 mg total) by mouth every 6 (six) hours as needed for moderate pain. 08/16/16  Yes Daniel J Angiulli, PA-C  aspirin EC 81 MG EC tablet Take 1 tablet (81 mg total) by mouth daily. Patient not taking: Reported on 02/22/2017 10/04/16   Murlean Iba, MD    Inpatient medications:   Discontinued Meds:   Medications Discontinued During This Encounter  Medication Reason  . acetaminophen (TYLENOL) 500 MG tablet Discontinued by provider    Social History:  reports that he has quit smoking. He has never used smokeless tobacco. He reports that he does not drink alcohol or use drugs.  Family History:   Family History  Problem Relation Age of Onset  . Congestive Heart Failure Mother   . Kidney failure Sister   . Hypertension Sister   . Diabetes Mellitus II Brother   . CAD Brother   . Lung cancer Brother   . Colon cancer Neg Hx   . Esophageal cancer Neg Hx     Pertinent items are noted in HPI. Weight change:  No intake or output data in the 24 hours ending 02/23/17 0000 BP (!) 167/99 (BP Location: Left Arm) Comment: Simultaneous  filing. User may not have seen previous data.  Pulse 90 Comment: Simultaneous filing. User may not have seen previous data.  Temp 97.6 F (36.4 C) (Oral)   Resp 16   SpO2 99%  Vitals:   02/22/17 1815 02/22/17 2230 02/22/17 2254  BP: (!) 160/102 (!) 167/99   Pulse: 79 90   Resp: 14 16   Temp: 97.6 F (36.4  C)    TempSrc: Oral    SpO2: 97% 96% 99%     General appearance: fatigued, pale and shaking Head: Normocephalic, without obvious abnormality, atraumatic Eyes: negative findings: lids and lashes normal, conjunctivae and sclerae normal and corneas clear Neck: no adenopathy, no carotid bruit, no JVD, supple, symmetrical, trachea midline and thyroid not enlarged, symmetric, no tenderness/mass/nodules Resp: clear to auscultation bilaterally Cardio: no rub and tachycardic at 118 GI: hypoactive bowel sounds, tender to palpation especially in the suprapubic region and lower quadrants, no rebound or guarding. Extremities: extremities normal, atraumatic, no cyanosis or edema  Labs: Basic Metabolic Panel:  Recent Labs Lab 02/22/17 2134  NA 132*  K 6.8*  CL 104  CO2 11*  GLUCOSE 90  BUN 121*  CREATININE 17.69*  CALCIUM 9.3   Liver Function Tests: No results for input(s): AST, ALT, ALKPHOS, BILITOT, PROT, ALBUMIN in the last 168 hours. No results for input(s): LIPASE, AMYLASE in the last 168 hours. No results for input(s): AMMONIA in the last 168 hours. CBC:  Recent Labs Lab 02/22/17 2134  WBC 15.0*  HGB 9.8*  HCT 28.4*  MCV 80.2  PLT 348   PT/INR: @LABRCNTIP (inr:5) Cardiac Enzymes: )No results for input(s): CKTOTAL, CKMB, CKMBINDEX, TROPONINI in the last 168 hours. CBG: No results for input(s): GLUCAP in the last 168 hours.  Iron Studies: No results for input(s): IRON, TIBC, TRANSFERRIN, FERRITIN in the last 168 hours.  Xrays/Other Studies: No results found.   Assessment/Plan: 1.  AKI/CKD- in setting of volume depletion and possible bladder outlet obstruction  in a patient with both prostate and bladder cancer.  Agree with IVF's and follow bladder scans and Scr.  May need Urology consult for placement of foley and/or cystoscopy.  Will recheck labs since he has received some IVF's.  Will also check renal US to evaluate kidneys and bladder.  Agree with transferring [facility] to [facility] in case he requires HD (if potassium does not improve).  2. Hyperkalemia- due to #1 and metabolic acidosis.  Treated with IV calcium, insulin/D50 and now on bicarb drip.  Repeat labs and hopefully we can avoid HD with conservative therapy.  Per ED MD no EKG changes consistent with Hyperkalemia. 3. Metabolic acidosis- due to #1, on isotonic bicarb.  Follow labs. 4. Pyuria/hematuria- will need to send Urine for Cx and sensitivity, empiric antibiotics per primary 5. Bladder cancer 6. Prostate cancer 7. Anemia- normocytic with h/o bladder and prostate cancer as well as ckd stage 2-3 and gross hematuria and hematochezia.   8. Bloody stools- guaiac stools and follow H/H  9. Abdominal pain- unclear if this is related to his bladder cancer, UTI, or GI process.  May benefit from imaging studies to evaluate and/or GI consult if he does have heme + stools. 10. CAD- currently asymptomatic 11. FTT- has not been doing well for several months, likely related to his malignancies and advanced age.   Broadus John A Ebunoluwa Gernert 02/23/2017, 12:00 AM

## 2017-02-23 NOTE — H&P (Signed)
History and Physical    DRAVIN LANCE XBJ:[id number] DOB: 08/07/1931 DOA: 02/22/2017  PCP: [name], MD   Patient coming from: Home  Chief Complaint: Not eating or drinking, not urinating, rectal bleeding   HPI: [name] is a 83 y.o. male with medical history significant for CAD status post CABG, prostate and bladder cancer status post multiple transurethral resections, recurrent UTIs, chronic kidney disease stage III, and chronic normocytic anemia who presents the emergency department with 1-2 weeks of not eating or drinking and several days without urine output. Patient is accompanied by his wife and daughter who provide much of the history. The patient had reportedly been in his usual state of chronic illness before worsening rapidly over the past 1-2 weeks. He has refused food or drink over this interval and his wife reports that he has only had a couple sips of Ensure and no water for about a week. He has not been urinating for the past several days, was having multiple loose stools daily, and now has only had small amounts of bright red blood in his diaper for the past 2 days. Aside from loss of appetite, patient has also complained of pain in the right lower abdomen and right groin. These pain complaints have been going on for months per family. Patient has denied chest pain or palpitations and denies any significant dyspnea or cough. There is been no fevers or chills reported.  ED Course: Upon arrival to the ED, patient is found to be afebrile, saturating well on room air, mildly tachycardic, and with stable blood pressure. EKG is notable for a sinus tachycardia with rate 117 and a right bundle branch block. Chemistry panels notable for potassium of 6.8, BUN 121, and serum creatinine of 17. CBC is notable for a leukocytosis to 15,000 and a stable normocytic anemia with hemoglobin of 9.8. Urinalysis is suggestive of infection. Urine was sent for culture, patient was given a liter  of normal saline, empiric dose of Rocephin, and symptomatic care with fentanyl. He was provided with temporizing measures for the hyper K and nephrology was consulted by the ED physician. Nephrology has evaluated the patient and the emergency department and advises a medical admission for further evaluation and management of acute renal failure with hyperkalemia and uremia. Admission to [facility] is advised should the patient require dialysis.  Review of Systems:  All other systems reviewed and apart from HPI, are negative.  Past Medical History:  Diagnosis Date  . Bladder cancer ([place]) 05/2015  . CAD (coronary artery disease)   . Hyperlipidemia   . Hypertension   . Kidney stones   . Prostate CA [facility])     Past Surgical History:  Procedure Laterality Date  . CARDIAC SURGERY    . CORONARY ARTERY BYPASS GRAFT    . PENILE PROSTHESIS IMPLANT    . PROSTATE SURGERY       reports that he has quit smoking. He has never used smokeless tobacco. He reports that he does not drink alcohol or use drugs.  Allergies  Allergen Reactions  . Gabapentin Other (See Comments)    Shaking  . Aspirin     Causes blood in urine? Per spouse   . Other Other (See Comments)    Antihistamines-unknown reaction    Family History  Problem Relation Age of Onset  . Congestive Heart Failure Mother   . Kidney failure Sister   . Hypertension Sister   . Diabetes Mellitus II Brother   .  CAD Brother   . Lung cancer Brother   . Colon cancer Neg Hx   . Esophageal cancer Neg Hx      Prior to Admission medications   Medication Sig Start Date End Date Taking? Authorizing Provider  Multiple Vitamin (MULTIVITAMIN WITH MINERALS) TABS tablet Take 1 tablet by mouth daily.   Yes Historical Provider, MD  rosuvastatin (CRESTOR) 10 MG tablet Take 10 mg by mouth daily. pw 08/11/16  Yes Historical Provider, MD  traMADol (ULTRAM) 50 MG tablet Take 1 tablet (50 mg total) by mouth every 6 (six) hours as needed for  moderate pain. 08/16/16  Yes Daniel J Angiulli, PA-C  aspirin EC 81 MG EC tablet Take 1 tablet (81 mg total) by mouth daily. Patient not taking: Reported on 02/22/2017 10/04/16   Murlean Iba, MD    Physical Exam: Vitals:   02/23/17 0218 02/23/17 0230 02/23/17 0356 02/23/17 0400  BP: 127/76 128/79 140/84 (!) 142/69  Pulse: (!) 108 (!) 106 98 99  Resp: 20  20 (!) 32  Temp:      TempSrc:      SpO2: 97% 98% 94% 93%      Constitutional: NAD, calm, comfortable, chronically-ill in appearance Eyes: PERTLA, lids and conjunctivae normal ENMT: Mucous membranes are dry. Posterior pharynx clear of any exudate or lesions.   Neck: normal, supple, no masses, no thyromegaly Respiratory: clear to auscultation bilaterally, no wheezing, no crackles. Normal respiratory effort.  Cardiovascular: Rate ~110 and regular. 1+ pretibial edema bilaterally. No significant JVD. Abdomen: No distension, tender in lower abd without peritoneal signs.    Musculoskeletal: no clubbing / cyanosis. No joint deformity upper and lower extremities.   Skin: no significant rashes, lesions, ulcers. Warm, dry, well-perfused. Poor turgor.  Neurologic: No gross facial asymmetry, PERRL. Sensation intact.  Psychiatric: Somnolent, arousable. Oriented x 3.      Labs on Admission: I have personally reviewed following labs and imaging studies  CBC:  Recent Labs Lab 02/22/17 2134  WBC 15.0*  HGB 9.8*  HCT 28.4*  MCV 80.2  PLT 224   Basic Metabolic Panel:  Recent Labs Lab 02/22/17 2134 02/23/17 0055  NA 132* 131*  K 6.8* 5.9*  CL 104 105  CO2 11* 11*  GLUCOSE 90 141*  BUN 121* 107*  CREATININE 17.69* 16.84*  CALCIUM 9.3 8.6*  MG  --  2.4  PHOS  --  8.2*   GFR: CrCl cannot be calculated (Unknown ideal weight.). Liver Function Tests:  Recent Labs Lab 02/23/17 0055  ALBUMIN 2.1*   No results for input(s): LIPASE, AMYLASE in the last 168 hours. No results for input(s): AMMONIA in the last 168  hours. Coagulation Profile: No results for input(s): INR, PROTIME in the last 168 hours. Cardiac Enzymes:  Recent Labs Lab 02/23/17 0055  CKTOTAL 47*   BNP (last 3 results) No results for input(s): PROBNP in the last 8760 hours. HbA1C: No results for input(s): HGBA1C in the last 72 hours. CBG:  Recent Labs Lab 02/23/17 0007 02/23/17 0132  GLUCAP 116* 108*   Lipid Profile: No results for input(s): CHOL, HDL, LDLCALC, TRIG, CHOLHDL, LDLDIRECT in the last 72 hours. Thyroid Function Tests: No results for input(s): TSH, T4TOTAL, FREET4, T3FREE, THYROIDAB in the last 72 hours. Anemia Panel: No results for input(s): VITAMINB12, FOLATE, FERRITIN, TIBC, IRON, RETICCTPCT in the last 72 hours. Urine analysis:    Component Value Date/Time   COLORURINE YELLOW 02/23/2017 0046   APPEARANCEUR TURBID (A) 02/23/2017 0046   LABSPEC  1.023 02/23/2017 0046   PHURINE 7.0 02/23/2017 0046   GLUCOSEU NEGATIVE 02/23/2017 0046   HGBUR MODERATE (A) 02/23/2017 0046   BILIRUBINUR NEGATIVE 02/23/2017 0046   KETONESUR NEGATIVE 02/23/2017 0046   PROTEINUR 100 (A) 02/23/2017 0046   UROBILINOGEN 0.2 09/16/2015 1741   NITRITE NEGATIVE 02/23/2017 0046   LEUKOCYTESUR MODERATE (A) 02/23/2017 0046   Sepsis Labs: @LABRCNTIP (procalcitonin:4,lacticidven:4) )No results found for this or any previous visit (from the past 240 hour(s)).   Radiological Exams on Admission: US Renal  Result Date: 02/23/2017 CLINICAL DATA:  Acute renal failure EXAM: RENAL / URINARY TRACT ULTRASOUND COMPLETE COMPARISON:  Renal ultrasound 09/02/2016 CT abdomen pelvis 07/28/2016 FINDINGS: Right Kidney: Length: 13.2 cm. There is severe right hydroureteronephrosis with the proximal ureter measuring up to 2.5 cm and the distal ureter measuring up 1.9 cm. No calculus is identified. No solid renal mass. Left Kidney: The left kidney could not be visualized, as the patient was lying on his left side and could not be repositioned. Bladder: The  urinary bladder is not distended. There is a masslike area of heterogeneous echotexture in the lower pelvis, measuring 9.1 x 7 x 8 cm. IMPRESSION: 1. Severe right hydroureteronephrosis. 2. Masslike area of heterogeneous echotexture at the expected location of the urinary bladder. Further evaluation with CT or MRI of the abdomen and pelvis is recommended. Electronically Signed   By: Ulyses Jarred [name]   On: 02/23/2017 01:57    EKG: Independently reviewed. Sinus tachycardia (rate 117), RBBB  Assessment/Plan  1. Acute renal failure superimposed on CKD stage III - SCr 17, up from 1.7 in January 2018  - Has not eaten or drank in a week per family and is intravascularly depleted; there is also concern for obstruction in light of cancers - Nephrology is consulting and much appreciated; pt provided with bicarbonate infusion - Renal US obtained and concerning for a right-sided obstruction at level of bladder; CT abd/pelvis pending  - Following serial chem panels and addressing hyperkalemia as below    2. UTI  - UA suggests infection  - Urine sent for culture and empiric Rocephin given in ED  - Given hx of pseudomonal UTI, will continue treatment with cefepime while awaiting culture    3. Hematochezia, normocytic anemia   - Hgb is 9.8 on admission, stable from January 2018  - Pt's wife reports noted small amounts of red blood in his diaper for the 2 days leading up to admission  - FOBT is pending, CBC will be trended, type & screen    4. CAD - No anginal complaints on admission  - Hx of CABG  - Continue Crestor as tolerated   5. Bladder cancer, prostate cancer  - Followed by [facility]rology at [facility] and s/p multiple transurethral resections - Renal US with severe right-sided hydro and mass at bladder; CT pending for further eval  - Urology consulting and much appreciated, will follow-up recs    6. Hyperkalemia  - Serum potassium 6.8 on admission  - Given insulin with dextrose, albuterol, Kayexalate,  and IV calcium in ED  - On bicarbonate infusion  - Monitoring on telemetry, following serial potassium levels  - Nephro is consulting and much appreciated; may need HD if fails to normalize   DVT prophylaxis: SCD's Code Status: Full  Family Communication: Wife and daughter updated at bedside Disposition Plan: Admit to SDU Consults called: Nephrology, Urology Admission status: Inpatient    Vianne Bulls, MD [facility] Pager [phone]  If 7PM-7AM, please contact  night-coverage [email or web address] Password [email or web address]  02/23/2017, 4:30 AM

## 2017-02-23 NOTE — Progress Notes (Signed)
Critical value of K+ 6.7 received , Dr Allyson Sabal notified and ordered dose of Kayexalate given. Also paged MD for notifying that temp is 101.7 and asked for tylenol prn. Consuelo Pandy RN

## 2017-02-23 NOTE — Consult Note (Signed)
Chief Complaint: Patient was seen in consultation today for urinary retention  Referring Physician(s):  Dr. Alyson Ingles  Supervising Physician: Jacqulynn Cadet  Patient Status: [facility] - In-pt  History of Present Illness: [name] is a 83 y.o. male with past medical history of CAD, HLD, HTN, kidney stones, prostate cancer, and bladder cancer who presents with urinary retention due to obstructing bladder mass. A foley catheter was unable to be placed. IR consulted for bilateral nephrostomy tube placement at the request of Dr. Alyson Ingles. Dr. Laurence Ferrari has reviewed patient's case and feels patient is appropriate for procedure.   Note patient has also been evaluated by nephrology for acute metabolic renal failure. Per nephrology note, no need for HD at this time.   Wife at bedside. Patient has been NPO.  He is not currently on blood thinners.    Past Medical History:  Diagnosis Date  . Bladder cancer ([place]) 05/2015  . CAD (coronary artery disease)   . Hyperlipidemia   . Hypertension   . Kidney stones   . Prostate CA [facility])     Past Surgical History:  Procedure Laterality Date  . CARDIAC SURGERY    . CORONARY ARTERY BYPASS GRAFT    . PENILE PROSTHESIS IMPLANT    . PROSTATE SURGERY      Allergies: Gabapentin; Aspirin; and Other  Medications: Prior to Admission medications   Medication Sig Start Date End Date Taking? Authorizing Provider  Multiple Vitamin (MULTIVITAMIN WITH MINERALS) TABS tablet Take 1 tablet by mouth daily.   Yes Historical Provider, MD  rosuvastatin (CRESTOR) 10 MG tablet Take 10 mg by mouth daily. pw 08/11/16  Yes Historical Provider, MD  traMADol (ULTRAM) 50 MG tablet Take 1 tablet (50 mg total) by mouth every 6 (six) hours as needed for moderate pain. 08/16/16  Yes Daniel J Angiulli, PA-C  aspirin EC 81 MG EC tablet Take 1 tablet (81 mg total) by mouth daily. Patient not taking: Reported on 02/22/2017 10/04/16   Clanford Marisa Hua, MD     Family  History  Problem Relation Age of Onset  . Congestive Heart Failure Mother   . Kidney failure Sister   . Hypertension Sister   . Diabetes Mellitus II Brother   . CAD Brother   . Lung cancer Brother   . Colon cancer Neg Hx   . Esophageal cancer Neg Hx     Social History   Social History  . Marital status: Married    Spouse name: N/A  . Number of children: N/A  . Years of education: N/A   Social History Main Topics  . Smoking status: Former Research scientist (life sciences)  . Smokeless tobacco: Never Used  . Alcohol use No  . Drug use: No  . Sexual activity: Not Asked   Other Topics Concern  . None   Social History Narrative  . None    Review of Systems  Unable to perform ROS: Mental status change   Vital Signs: BP (!) 152/89 (BP Location: Right Arm)   Pulse 96   Temp (!) 101.7 F (38.7 C) (Axillary)   Resp (!) 21   Ht 6\' 3"  (1.905 m)   Wt 235 lb 14.3 oz (107 kg)   SpO2 97%   BMI 29.48 kg/m   Physical Exam  Constitutional: He appears well-developed.  Cardiovascular: Regular rhythm and normal heart sounds.   tachycardia  Pulmonary/Chest: Effort normal and breath sounds normal. No respiratory distress.  Abdominal: He exhibits distension. There is tenderness.  Neurological:  Arousable, but not currently oriented.  Skin: Skin is warm and dry.  Nursing note and vitals reviewed.   Mallampati Score:  MD Evaluation Airway: Other (comments) Airway comments: patient unable to follow commands Heart: WNL Abdomen: WNL Chest/ Lungs: WNL ASA  Classification: 3 Mallampati/Airway Score: Two  Imaging: US Renal  Result Date: 02/23/2017 CLINICAL DATA:  Acute renal failure EXAM: RENAL / URINARY TRACT ULTRASOUND COMPLETE COMPARISON:  Renal ultrasound 09/02/2016 CT abdomen pelvis 07/28/2016 FINDINGS: Right Kidney: Length: 13.2 cm. There is severe right hydroureteronephrosis with the proximal ureter measuring up to 2.5 cm and the distal ureter measuring up 1.9 cm. No calculus is identified. No  solid renal mass. Left Kidney: The left kidney could not be visualized, as the patient was lying on his left side and could not be repositioned. Bladder: The urinary bladder is not distended. There is a masslike area of heterogeneous echotexture in the lower pelvis, measuring 9.1 x 7 x 8 cm. IMPRESSION: 1. Severe right hydroureteronephrosis. 2. Masslike area of heterogeneous echotexture at the expected location of the urinary bladder. Further evaluation with CT or MRI of the abdomen and pelvis is recommended. Electronically Signed   By: Ulyses Jarred [name]   On: 02/23/2017 01:57   Ct Renal Stone Study  Result Date: 02/23/2017 CLINICAL DATA:  Patient is dehydrated. No p.o. intake for 2 weeks. No urinary output. Penile infection and bleeding. History of stones. Bladder cancer. Recurrent prostate cancer. EXAM: CT ABDOMEN AND PELVIS WITHOUT CONTRAST TECHNIQUE: Multidetector CT imaging of the abdomen and pelvis was performed following the standard protocol without IV contrast. COMPARISON:  07/28/2016 FINDINGS: Lower chest: Small bilateral pleural effusions with basilar atelectasis. Fibrosis in the lung bases. Postoperative changes in the mediastinum consistent with bypass grafts. Hepatobiliary: Examination is limited due to motion artifact. No focal liver lesions appreciated. Gallbladder and bile ducts are grossly unremarkable. Pancreas: Unremarkable. No pancreatic ductal dilatation or surrounding inflammatory changes. Spleen: Normal in size without focal abnormality. Adrenals/Urinary Tract: No adrenal gland nodules. There is prominent bilateral hydronephrosis and hydroureter. Hydronephrosis is progressing since prior study. There is a 9 mm stone in the distal right ureter. On the previous study, this stone was in the kidney. There is a heterogeneous mass involving the bladder with infiltration and a surrounding tissues. The mass may be contributing to bilateral ureteral obstruction. Difficult to separate the bladder  from the mass on noncontrast imaging. Altogether, the bladder/mass measures about 6.6 x 9 cm. There is progression since the previous study. Stomach/Bowel: Stomach and small bowel are decompressed. Stool-filled colon without abnormal distention. Diverticulosis of the sigmoid colon with probable muscular hypertrophy. Difficult the separated portion of the sigmoid colon from the bladder mass and involvement of the colon is suspected. Appendix is not identified. Vascular/Lymphatic: Aortic atherosclerosis. No enlarged abdominal or pelvic lymph nodes. Reproductive: Postoperative changes in the prostate gland. Penile prosthesis. Other: Small left inguinal hernia containing fat. No free air or free fluid in the abdomen. Musculoskeletal: Degenerative changes in the spine and hips. No destructive bone lesions. IMPRESSION: Prominent bilateral hydronephrosis and hydroureter. There is a 9 mm stone in the distal right ureter. The bladder wall is irregular with prominent soft tissue, suggesting an infiltrating mass. This may be causing obstruction of the left ureter. The mass is progressing since previous study and now demonstrates likely invasion into the adjacent sigmoid colon. Prostate gland is surgically absent. Penile prosthesis. Small bilateral pleural effusions with atelectasis or consolidation in the lung bases. Aortic atherosclerosis. Small left inguinal hernia  containing fat. Electronically Signed   By: Lucienne Capers [name]   On: 02/23/2017 06:18    Labs:  CBC:  Recent Labs  10/03/16 0530 12/08/16 1754 02/22/17 2134 02/23/17 0450  WBC 11.1* 10.7* 15.0* 14.8*  HGB 9.3* 9.9* 9.8* 8.4*  HCT 28.4* 30.8* 28.4* 24.8*  PLT PLATELET CLUMPS NOTED ON SMEAR, COUNT APPEARS ADEQUATE 350 348 303    COAGS:  Recent Labs  10/01/16 1948  INR 1.48  APTT 31    BMP:  Recent Labs  02/22/17 2134 02/23/17 0055 02/23/17 0450 02/23/17 0921  NA 132* 131* 135 135  K 6.8* 5.9* 5.9* 6.7*  CL 104 105 105 104    CO2 11* 11* 11* 14*  GLUCOSE 90 141* 104* 97  BUN 121* 107* 101* 116*  CALCIUM 9.3 8.6* 9.0 8.7*  CREATININE 17.69* 16.84* 17.05* 17.24*  GFRNONAA 2* 2* 2* 2*  GFRAA 2* 3* 3* 2*    LIVER FUNCTION TESTS:  Recent Labs  08/03/16 0641 09/01/16 1724 10/01/16 1618 12/08/16 1754 02/23/17 0055 02/23/17 0450  BILITOT 0.6 0.6 0.4 0.5  --   --   AST 49* 23 55* 18  --   --   ALT 45 18 35 10*  --   --   ALKPHOS 49 53 62 48  --   --   PROT 6.4* 7.9 8.1 7.6  --   --   ALBUMIN 2.2* 3.1* 2.7* 2.7* 2.1* 2.3*    TUMOR MARKERS: No results for input(s): AFPTM, CEA, CA199, CHROMGRNA in the last 8760 hours.  Assessment and Plan: Bilateral hydronephrosis with ARF Patient with obstructing bladder cancer, gross hematuria, and bilateral hydronephrosis with ARF.  Patient is current lethargic but arousable.  WBC 14.8 SCr 17.6 IR consulted for urgent decompression with bilateral nephrostomy tube placement.  Case reviewed by Dr. Laurence Ferrari who feels patient is appropriate for procedure.  Discussed patient status with Dr. Glade Lloyd who confirms no need additional procedures at this time.  Risks and benefits discussed with the patient's wife including, but not limited to infection, bleeding, significant bleeding causing loss or decrease in renal function or damage to adjacent structures.  All of the patient's wife's questions were answered, patient is agreeable to proceed. Consent signed and in chart.   Thank you for this interesting consult.  I greatly enjoyed meeting [name] and look forward to participating in their care.  A copy of this report was sent to the requesting provider on this date.  Electronically Signed: Docia Barrier 02/23/2017, 1:33 PM   I spent a total of 40 Minutes    in face to face in clinical consultation, greater than 50% of which was counseling/coordinating care for bilateral hydronephrosis.

## 2017-02-23 NOTE — Progress Notes (Addendum)
CRITICAL VALUE ALERT  Critical value received: K+ 7.0  Date of notification:  02/23/17 Time of notification:  1470  Critical value read back:yes  Nurse who received alert:  Consuelo Pandy  MD notified (1st page): Dr Allyson Sabal  Time of first page:  1346  MD notified (2nd page):  Time of second page:  Responding MD:  Dr Allyson Sabal  Time MD responded: [phone]

## 2017-02-23 NOTE — ED Notes (Signed)
Nephrologist at bedside

## 2017-02-23 NOTE — Progress Notes (Signed)
Pt K+=7.0, pt given D50 and insulin per order, waiting for 2nd dose of Kayexalate. Dr Allyson Sabal notified. Pt also given tylenol for temp 101.7. Consuelo Pandy RN

## 2017-02-23 NOTE — Progress Notes (Signed)
Able to complete some of admission history with pt and spouse on the phone.  Consuelo Pandy RN

## 2017-02-23 NOTE — Progress Notes (Signed)
CRITICAL VALUE ALERT  Critical value received:  K+ 6.7  Date of notification:  02/23/17  Time of notification:  1000  Critical value read back:yes  Nurse who received alert: Consuelo Pandy RN  MD notified (1st page): Abrol  Time of first page:  1010  MD notified (2nd page):  Time of second page:  Responding MD:   Time MD responded:

## 2017-02-23 NOTE — Consult Note (Signed)
Urology Consult  Referring physician: Dr. Allyson Sabal Reason for referral: gross hematuria, bladder cancer  Chief Complaint: suprapubic pain  History of Present Illness: Mr [name] is a 83yo with a hx of bladder cancer, prostate cancer and ED with IPP in place who was admitted with acute renal failure. Creatinine 17. He is followed at [facility] for his bladder cancer and has undergone multiple resections. Per the patent's wife he has had gross hematuria for weeks. He notes worsening urinary urgency, frequency, dysuria and a feeling of incomplete emptying. Ct scan shows severe bilateral hydronephrosis to the level of the bladder and a 10cm bladder mass. The patient is currently confused and cannot give additional history  Past Medical History:  Diagnosis Date  . Bladder cancer ([place]) 05/2015  . CAD (coronary artery disease)   . Hyperlipidemia   . Hypertension   . Kidney stones   . Prostate CA [facility])    Past Surgical History:  Procedure Laterality Date  . CARDIAC SURGERY    . CORONARY ARTERY BYPASS GRAFT    . PENILE PROSTHESIS IMPLANT    . PROSTATE SURGERY      Medications: I have reviewed the patient's current medications. Allergies:  Allergies  Allergen Reactions  . Gabapentin Other (See Comments)    Shaking  . Aspirin     Causes blood in urine? Per spouse   . Other Other (See Comments)    Antihistamines-unknown reaction    Family History  Problem Relation Age of Onset  . Congestive Heart Failure Mother   . Kidney failure Sister   . Hypertension Sister   . Diabetes Mellitus II Brother   . CAD Brother   . Lung cancer Brother   . Colon cancer Neg Hx   . Esophageal cancer Neg Hx    Social History:  reports that he has quit smoking. He has never used smokeless tobacco. He reports that he does not drink alcohol or use drugs.  Review of Systems  Constitutional: Positive for malaise/fatigue.  Respiratory: Positive for shortness of breath.   Gastrointestinal: Positive for  abdominal pain.  Genitourinary: Positive for dysuria, frequency, hematuria and urgency.  Neurological: Positive for weakness.  Psychiatric/Behavioral: Positive for memory loss.  All other systems reviewed and are negative.   Physical Exam:  Vital signs in last 24 hours: Temp:  [97.6 F (36.4 C)-101.7 F (38.7 C)] 101.7 F (38.7 C) (03/17 1100) Pulse Rate:  [79-120] 96 (03/17 1100) Resp:  [14-32] 21 (03/17 1100) BP: (116-167)/(69-102) 152/89 (03/17 1100) SpO2:  [93 %-100 %] 97 % (03/17 1100) Weight:  [107 kg (235 lb 14.3 oz)] 107 kg (235 lb 14.3 oz) (03/17 0435) Physical Exam  Constitutional: He appears well-developed and well-nourished.  HENT:  Head: Normocephalic and atraumatic.  Eyes: EOM are normal. Pupils are equal, round, and reactive to light.  Neck: Normal range of motion. No thyromegaly present.  Cardiovascular: Normal rate and regular rhythm.   Respiratory: Effort normal. No respiratory distress.  GI: Soft. He exhibits distension. There is tenderness. Hernia confirmed negative in the right inguinal area and confirmed negative in the left inguinal area.  Genitourinary: Testes normal. Uncircumcised.  Genitourinary Comments: IPP in place, partially inflated  Musculoskeletal: Normal range of motion. He exhibits no edema.  Lymphadenopathy:       Right: No inguinal adenopathy present.       Left: No inguinal adenopathy present.  Neurological: He is alert. No cranial nerve deficit. Coordination normal.  Skin: Skin is warm and dry.  Psychiatric:  He has a normal mood and affect. His behavior is normal.    Laboratory Data:  Results for orders placed or performed during the hospital encounter of 02/22/17 (from the past 72 hour(s))  CBC     Status: Abnormal   Collection Time: 02/22/17  9:34 PM  Result Value Ref Range   WBC 15.0 (H) 4.0 - 10.5 K/uL   RBC 3.54 (L) 4.22 - 5.81 MIL/uL   Hemoglobin 9.8 (L) 13.0 - 17.0 g/dL   HCT 28.4 (L) 39.0 - 52.0 %   MCV 80.2 78.0 - 100.0 fL    MCH 27.7 26.0 - 34.0 pg   MCHC 34.5 30.0 - 36.0 g/dL   RDW 15.3 11.5 - 15.5 %   Platelets 348 150 - 400 K/uL  Basic metabolic panel     Status: Abnormal   Collection Time: 02/22/17  9:34 PM  Result Value Ref Range   Sodium 132 (L) 135 - 145 mmol/L   Potassium 6.8 (HH) 3.5 - 5.1 mmol/L    Comment: REPEATED TO VERIFY NO VISIBLE HEMOLYSIS CRITICAL RESULT CALLED TO, READ BACK BY AND VERIFIED WITH: E BULLUM RN 2235 02/22/17 A NAVARRO    Chloride 104 101 - 111 mmol/L   CO2 11 (L) 22 - 32 mmol/L   Glucose, Bld 90 65 - 99 mg/dL   BUN 121 (H) 6 - 20 mg/dL    Comment: RESULTS CONFIRMED BY MANUAL DILUTION   Creatinine, Ser 17.69 (H) 0.61 - 1.24 mg/dL   Calcium 9.3 8.9 - 10.3 mg/dL   GFR calc non Af Amer 2 (L) >60 mL/min   GFR calc Af Amer 2 (L) >60 mL/min    Comment: (NOTE) The eGFR has been calculated using the CKD EPI equation. This calculation has not been validated in all clinical situations. eGFR's persistently <60 mL/min signify possible Chronic Kidney Disease.    Anion gap 17 (H) 5 - 15  POC CBG, ED     Status: Abnormal   Collection Time: 02/23/17 12:07 AM  Result Value Ref Range   Glucose-Capillary 116 (H) 65 - 99 mg/dL   Comment 1 Notify RN    Comment 2 Document in Chart   Urinalysis, Routine w reflex microscopic- may I&O cath if menses     Status: Abnormal   Collection Time: 02/23/17 12:46 AM  Result Value Ref Range   Color, Urine YELLOW YELLOW   APPearance TURBID (A) CLEAR   Specific Gravity, Urine 1.023 1.005 - 1.030   pH 7.0 5.0 - 8.0   Glucose, UA NEGATIVE NEGATIVE mg/dL   Hgb urine dipstick MODERATE (A) NEGATIVE   Bilirubin Urine NEGATIVE NEGATIVE   Ketones, ur NEGATIVE NEGATIVE mg/dL   Protein, ur 100 (A) NEGATIVE mg/dL   Nitrite NEGATIVE NEGATIVE   Leukocytes, UA MODERATE (A) NEGATIVE   RBC / HPF TOO NUMEROUS TO COUNT 0 - 5 RBC/hpf   WBC, UA TOO NUMEROUS TO COUNT 0 - 5 WBC/hpf   Bacteria, UA MANY (A) NONE SEEN   Squamous Epithelial / LPF NONE SEEN NONE  SEEN   WBC Clumps PRESENT    Budding Yeast PRESENT   Renal function panel     Status: Abnormal   Collection Time: 02/23/17 12:55 AM  Result Value Ref Range   Sodium 131 (L) 135 - 145 mmol/L   Potassium 5.9 (H) 3.5 - 5.1 mmol/L   Chloride 105 101 - 111 mmol/L   CO2 11 (L) 22 - 32 mmol/L   Glucose, Bld 141 (H) 65 -  99 mg/dL   BUN 107 (H) 6 - 20 mg/dL    Comment: RESULTS CONFIRMED BY MANUAL DILUTION   Creatinine, Ser 16.84 (H) 0.61 - 1.24 mg/dL   Calcium 8.6 (L) 8.9 - 10.3 mg/dL   Phosphorus 8.2 (H) 2.5 - 4.6 mg/dL   Albumin 2.1 (L) 3.5 - 5.0 g/dL   GFR calc non Af Amer 2 (L) >60 mL/min   GFR calc Af Amer 3 (L) >60 mL/min    Comment: (NOTE) The eGFR has been calculated using the CKD EPI equation. This calculation has not been validated in all clinical situations. eGFR's persistently <60 mL/min signify possible Chronic Kidney Disease.    Anion gap 15 5 - 15  CK     Status: Abnormal   Collection Time: 02/23/17 12:55 AM  Result Value Ref Range   Total CK 47 (L) 49 - 397 U/L  Magnesium     Status: None   Collection Time: 02/23/17 12:55 AM  Result Value Ref Range   Magnesium 2.4 1.7 - 2.4 mg/dL  POC CBG, ED     Status: Abnormal   Collection Time: 02/23/17  1:32 AM  Result Value Ref Range   Glucose-Capillary 108 (H) 65 - 99 mg/dL   Comment 1 Notify RN    Comment 2 Document in Chart   Renal function panel     Status: Abnormal   Collection Time: 02/23/17  4:50 AM  Result Value Ref Range   Sodium 135 135 - 145 mmol/L   Potassium 5.9 (H) 3.5 - 5.1 mmol/L   Chloride 105 101 - 111 mmol/L   CO2 11 (L) 22 - 32 mmol/L   Glucose, Bld 104 (H) 65 - 99 mg/dL   BUN 101 (H) 6 - 20 mg/dL    Comment: RESULTS CONFIRMED BY MANUAL DILUTION   Creatinine, Ser 17.05 (H) 0.61 - 1.24 mg/dL   Calcium 9.0 8.9 - 10.3 mg/dL   Phosphorus 7.3 (H) 2.5 - 4.6 mg/dL   Albumin 2.3 (L) 3.5 - 5.0 g/dL   GFR calc non Af Amer 2 (L) >60 mL/min   GFR calc Af Amer 3 (L) >60 mL/min    Comment: (NOTE) The eGFR  has been calculated using the CKD EPI equation. This calculation has not been validated in all clinical situations. eGFR's persistently <60 mL/min signify possible Chronic Kidney Disease.    Anion gap 19 (H) 5 - 15  CBC     Status: Abnormal   Collection Time: 02/23/17  4:50 AM  Result Value Ref Range   WBC 14.8 (H) 4.0 - 10.5 K/uL   RBC 3.02 (L) 4.22 - 5.81 MIL/uL   Hemoglobin 8.4 (L) 13.0 - 17.0 g/dL   HCT 24.8 (L) 39.0 - 52.0 %   MCV 82.1 78.0 - 100.0 fL   MCH 27.8 26.0 - 34.0 pg   MCHC 33.9 30.0 - 36.0 g/dL   RDW 15.8 (H) 11.5 - 15.5 %   Platelets 303 150 - 400 K/uL  MRSA PCR Screening     Status: None   Collection Time: 02/23/17  6:44 AM  Result Value Ref Range   MRSA by PCR NEGATIVE NEGATIVE    Comment:        The GeneXpert MRSA Assay (FDA approved for NASAL specimens only), is one component of a comprehensive MRSA colonization surveillance program. It is not intended to diagnose MRSA infection nor to guide or monitor treatment for MRSA infections.   Glucose, capillary     Status: Abnormal  Collection Time: 02/23/17  8:15 AM  Result Value Ref Range   Glucose-Capillary 107 (H) 65 - 99 mg/dL   Comment 1 Notify RN    Comment 2 Document in Chart   Basic metabolic panel     Status: Abnormal   Collection Time: 02/23/17  9:21 AM  Result Value Ref Range   Sodium 135 135 - 145 mmol/L   Potassium 6.7 (HH) 3.5 - 5.1 mmol/L    Comment: SLIGHT HEMOLYSIS CRITICAL RESULT CALLED TO, READ BACK BY AND VERIFIED WITH: R.MYRICK RN @ 1025 02/23/17 BY C.EDENS    Chloride 104 101 - 111 mmol/L   CO2 14 (L) 22 - 32 mmol/L   Glucose, Bld 97 65 - 99 mg/dL   BUN 116 (H) 6 - 20 mg/dL   Creatinine, Ser 17.24 (H) 0.61 - 1.24 mg/dL   Calcium 8.7 (L) 8.9 - 10.3 mg/dL   GFR calc non Af Amer 2 (L) >60 mL/min   GFR calc Af Amer 2 (L) >60 mL/min    Comment: (NOTE) The eGFR has been calculated using the CKD EPI equation. This calculation has not been validated in all clinical  situations. eGFR's persistently <60 mL/min signify possible Chronic Kidney Disease.    Anion gap 17 (H) 5 - 15  Type and screen [place]     Status: None   Collection Time: 02/23/17  9:21 AM  Result Value Ref Range   ABO/RH(D) A POS    Antibody Screen NEG    Sample Expiration 02/26/2017   ABO/Rh     Status: None   Collection Time: 02/23/17  9:21 AM  Result Value Ref Range   ABO/RH(D) A POS    Recent Results (from the past 240 hour(s))  MRSA PCR Screening     Status: None   Collection Time: 02/23/17  6:44 AM  Result Value Ref Range Status   MRSA by PCR NEGATIVE NEGATIVE Final    Comment:        The GeneXpert MRSA Assay (FDA approved for NASAL specimens only), is one component of a comprehensive MRSA colonization surveillance program. It is not intended to diagnose MRSA infection nor to guide or monitor treatment for MRSA infections.    Creatinine:  Recent Labs  02/22/17 2134 02/23/17 0055 02/23/17 0450 02/23/17 0921  CREATININE 17.69* 16.84* 17.05* 17.24*   Baseline Creatinine: unknown  Impression/Assessment:  83yo with bladder cancer, gross hematuria, bilateral hydronephrosis and ARF  Plan:  1. Multiple attempts were made to place a foley catheter which were unsuccessful due to the large bladder mass. 2. Bilateral hydronephrosis with ARF: the patient needs urgent decompression with bilateral nephrostomy tubes by interventional radiology.  Urology to continue to follow  Nicolette Bang 02/23/2017, 11:56 AM

## 2017-02-23 NOTE — Progress Notes (Signed)
Pharmacy Antibiotic Note  [name] is a 83 y.o. male admitted on 02/22/2017 with UTI.  Pharmacy has been consulted for Cefepime dosing.  Plan: Cefepime 2gm iv x1, then 500mg  iv q24hr  Weight: 235 lb 14.3 oz (107 kg)  Temp (24hrs), Avg:97.6 F (36.4 C), Min:97.6 F (36.4 C), Max:97.6 F (36.4 C)   Recent Labs Lab 02/22/17 2134 02/23/17 0055  WBC 15.0*  --   CREATININE 17.69* 16.84*    Estimated Creatinine Clearance: 4.3 mL/min (A) (by C-G formula based on SCr of 16.84 mg/dL (H)).    Allergies  Allergen Reactions  . Gabapentin Other (See Comments)    Shaking  . Aspirin     Causes blood in urine? Per spouse   . Other Other (See Comments)    Antihistamines-unknown reaction    Antimicrobials this admission: Ceftriaxone 02/23/2017 x1 Cefepime 02/23/2017 >>   Dose adjustments this admission: -  Microbiology results: pending  Thank you for allowing pharmacy to be a part of this patient's care.  Nani Skillern Crowford 02/23/2017 4:48 AM

## 2017-02-23 NOTE — Progress Notes (Addendum)
Patient ID: [name], male   DOB: [birth year], 83 y.o.   MRN: [id number] S: still having trouble urinating and having gross hematuria.  Dr. Alyson Ingles at bedside trying to place a Foley catheter. O:BP (!) 143/79 (BP Location: Left Arm)   Pulse 97   Temp 98.6 F (37 C) (Oral)   Resp (!) 27   Wt 107 kg (235 lb 14.3 oz)   SpO2 97%   BMI 29.48 kg/m   Intake/Output Summary (Last 24 hours) at 02/23/17 0939 Last data filed at 02/23/17 0800  Gross per 24 hour  Intake          5242.92 ml  Output                0 ml  Net          5242.92 ml   Intake/Output: I/O last 3 completed shifts: In: 3474 [I.V.:3110; IV Piggyback:1160] Out: -   Intake/Output this shift:  Total I/O In: 972.9 [I.V.:972.9] Out: -  Weight change:  QVZ:DGLO distress CVS: tachy no rub Resp:cta Abd:+BS, +suprapubic tenderness Ext:no edema   Recent Labs Lab 02/22/17 2134 02/23/17 0055 02/23/17 0450  NA 132* 131* 135  K 6.8* 5.9* 5.9*  CL 104 105 105  CO2 11* 11* 11*  GLUCOSE 90 141* 104*  BUN 121* 107* 101*  CREATININE 17.69* 16.84* 17.05*  ALBUMIN  --  2.1* 2.3*  CALCIUM 9.3 8.6* 9.0  PHOS  --  8.2* 7.3*   Liver Function Tests:  Recent Labs Lab 02/23/17 0055 02/23/17 0450  ALBUMIN 2.1* 2.3*   No results for input(s): LIPASE, AMYLASE in the last 168 hours. No results for input(s): AMMONIA in the last 168 hours. CBC:  Recent Labs Lab 02/22/17 2134 02/23/17 0450  WBC 15.0* 14.8*  HGB 9.8* 8.4*  HCT 28.4* 24.8*  MCV 80.2 82.1  PLT 348 303   Cardiac Enzymes:  Recent Labs Lab 02/23/17 0055  CKTOTAL 47*   CBG:  Recent Labs Lab 02/23/17 0007 02/23/17 0132 02/23/17 0815  GLUCAP 116* 108* 107*    Iron Studies: No results for input(s): IRON, TIBC, TRANSFERRIN, FERRITIN in the last 72 hours. Studies/Results: US Renal  Result Date: 02/23/2017 CLINICAL DATA:  Acute renal failure EXAM: RENAL / URINARY TRACT ULTRASOUND COMPLETE COMPARISON:  Renal ultrasound 09/02/2016 CT abdomen  pelvis 07/28/2016 FINDINGS: Right Kidney: Length: 13.2 cm. There is severe right hydroureteronephrosis with the proximal ureter measuring up to 2.5 cm and the distal ureter measuring up 1.9 cm. No calculus is identified. No solid renal mass. Left Kidney: The left kidney could not be visualized, as the patient was lying on his left side and could not be repositioned. Bladder: The urinary bladder is not distended. There is a masslike area of heterogeneous echotexture in the lower pelvis, measuring 9.1 x 7 x 8 cm. IMPRESSION: 1. Severe right hydroureteronephrosis. 2. Masslike area of heterogeneous echotexture at the expected location of the urinary bladder. Further evaluation with CT or MRI of the abdomen and pelvis is recommended. Electronically Signed   By: Ulyses Jarred [name]   On: 02/23/2017 01:57   Ct Renal Stone Study  Result Date: 02/23/2017 CLINICAL DATA:  Patient is dehydrated. No p.o. intake for 2 weeks. No urinary output. Penile infection and bleeding. History of stones. Bladder cancer. Recurrent prostate cancer. EXAM: CT ABDOMEN AND PELVIS WITHOUT CONTRAST TECHNIQUE: Multidetector CT imaging of the abdomen and pelvis was performed following the standard protocol without IV contrast. COMPARISON:  07/28/2016 FINDINGS: Lower chest:  Small bilateral pleural effusions with basilar atelectasis. Fibrosis in the lung bases. Postoperative changes in the mediastinum consistent with bypass grafts. Hepatobiliary: Examination is limited due to motion artifact. No focal liver lesions appreciated. Gallbladder and bile ducts are grossly unremarkable. Pancreas: Unremarkable. No pancreatic ductal dilatation or surrounding inflammatory changes. Spleen: Normal in size without focal abnormality. Adrenals/Urinary Tract: No adrenal gland nodules. There is prominent bilateral hydronephrosis and hydroureter. Hydronephrosis is progressing since prior study. There is a 9 mm stone in the distal right ureter. On the previous study,  this stone was in the kidney. There is a heterogeneous mass involving the bladder with infiltration and a surrounding tissues. The mass may be contributing to bilateral ureteral obstruction. Difficult to separate the bladder from the mass on noncontrast imaging. Altogether, the bladder/mass measures about 6.6 x 9 cm. There is progression since the previous study. Stomach/Bowel: Stomach and small bowel are decompressed. Stool-filled colon without abnormal distention. Diverticulosis of the sigmoid colon with probable muscular hypertrophy. Difficult the separated portion of the sigmoid colon from the bladder mass and involvement of the colon is suspected. Appendix is not identified. Vascular/Lymphatic: Aortic atherosclerosis. No enlarged abdominal or pelvic lymph nodes. Reproductive: Postoperative changes in the prostate gland. Penile prosthesis. Other: Small left inguinal hernia containing fat. No free air or free fluid in the abdomen. Musculoskeletal: Degenerative changes in the spine and hips. No destructive bone lesions. IMPRESSION: Prominent bilateral hydronephrosis and hydroureter. There is a 9 mm stone in the distal right ureter. The bladder wall is irregular with prominent soft tissue, suggesting an infiltrating mass. This may be causing obstruction of the left ureter. The mass is progressing since previous study and now demonstrates likely invasion into the adjacent sigmoid colon. Prostate gland is surgically absent. Penile prosthesis. Small bilateral pleural effusions with atelectasis or consolidation in the lung bases. Aortic atherosclerosis. Small left inguinal hernia containing fat. Electronically Signed   By: Lucienne Capers [name]   On: 02/23/2017 06:18   . [START ON 02/24/2017] ceFEPime (MAXIPIME) IV  500 mg Intravenous Q24H  . multivitamin with minerals  1 tablet Oral Daily  . rosuvastatin  10 mg Oral Daily    BMET    Component Value Date/Time   NA 135 02/23/2017 0450   K 5.9 (H) 02/23/2017  0450   CL 105 02/23/2017 0450   CO2 11 (L) 02/23/2017 0450   GLUCOSE 104 (H) 02/23/2017 0450   BUN 101 (H) 02/23/2017 0450   CREATININE 17.05 (H) 02/23/2017 0450   CALCIUM 9.0 02/23/2017 0450   GFRNONAA 2 (L) 02/23/2017 0450   GFRAA 3 (L) 02/23/2017 0450   CBC    Component Value Date/Time   WBC 14.8 (H) 02/23/2017 0450   RBC 3.02 (L) 02/23/2017 0450   HGB 8.4 (L) 02/23/2017 0450   HCT 24.8 (L) 02/23/2017 0450   PLT 303 02/23/2017 0450   MCV 82.1 02/23/2017 0450   MCH 27.8 02/23/2017 0450   MCHC 33.9 02/23/2017 0450   RDW 15.8 (H) 02/23/2017 0450   LYMPHSABS 2.4 12/08/2016 1754   MONOABS 1.4 (H) 12/08/2016 1754   EOSABS 0.3 12/08/2016 1754   BASOSABS 0.0 12/08/2016 1754     Assessment/Plan: 1.  AKI/CKD- in setting of volume depletion and bladder outlet obstruction due to recurrent bladder cancer.  Renal US with severe right hydroureteronephrosis with mass-like lesion in urinary bladder.    CT scan reveals bilateral hydronephrosis and hydroureter with 72mm stone in distal right ureter.  Bladder wall is irregular and infiltrating mass.  Appreciate Urology's input and agree that he needs bilateral PNT's by IR placed today.   1. Continue with IVF's.   2. Consult IR for bilateral perc nephrostomy tube placement 3. Will hold off on HD as this can be treated more appropriately with bilateral PNT's..  2. Hyperkalemia- due to #1 and metabolic acidosis.  Treated with IV calcium, insulin/D50 and now on bicarb drip.   1. Repeat labs somewhat improved and hopefully we can avoid HD with conservative therapy.   2. IR consult for bilateral PNT"s 3. Metabolic acidosis- due to #1, on isotonic bicarb.  Follow labs. 4. Pyuria/hematuria- will need to send Urine for Cx and sensitivity, empiric antibiotics per primary 5. Bladder cancer- per urology 6. Prostate cancer- per urology 7. Nephrolithiasis with obstruction on right.  PNT's as above. 8. Anemia- normocytic with h/o bladder and prostate cancer  as well as ckd stage 2-3 and gross hematuria and hematochezia.   9. Bloody stools- guaiac stools and follow H/H  10. Abdominal pain- unclear if this is related to his bladder cancer, UTI, or GI process.  May benefit from imaging studies to evaluate and/or GI consult if he does have heme + stools. 11. CAD- currently asymptomatic 12. FTT- has not been doing well for several months, likely related to his malignancies and advanced age. 13. Disposition- recommend Palliative care consult to help set goals/limits of care given his recurrent bladder cancer and failure to thrive over the last few months.    Donetta Potts, MD [employer] [phone]

## 2017-02-23 NOTE — Consult Note (Signed)
Consultation Note Date: 02/23/2017   Patient Name: [name]  DOB: 11/15/1931  MRN: [id number]  Age / Sex: 83 y.o., male  PCP: [name], MD Referring Physician: Reyne Dumas, MD  Reason for Consultation: Establishing goals of care and Psychosocial/spiritual support  HPI/Patient Profile: 83 y.o. male  with past medical history of bladder cancer, coronary artery disease with CABG,prostate surgery with penile prothesis  To prostate  cancer, hyperlipidemia, hypertension, admitted on 02/22/2017 after rapid decline over the past 1-2 weeks as evidenced by no oral intake, no urine output, and diarrhea. They also noted bright red blood in his diaper for the past 2 days. He is also been verbalizing pain in his right lower abdomen and groin. Upon arrival to the emergency room he was afebrile tachycardic with stable blood pressure.EKG showed right bundle branch block. Initial potassium was 6.8 on admission and serum creatinine was 17.Patient was found to have bilateral hydro-nephrosis. Nephrology as well as urology consults placed. Patient to go for urgent decompression of bladder with bilateral nephrostomy tubes.  He has a 6.6 x 9 cm mass that's invading the bladder. There is  In the lower pelvis measuring 9.1 x 7 x 7 8 cm. His potassium is now 7 and being followed by nephrology every 4 hours .   Clinical Assessment and Goals of Care: Patient is minimally responsive His wife is at the bedside. I updated her to her on his clinical condition specifically his renal failure and rising potassium and the clinical concerns related to both. Despite him being so sick she seems somewhat surprised about this dire news She does report that she and her husband have talked about advanced directives and she informs me  he would like to do everything up to the point of CPR defibrillation and ventilation. She understands the concerns for  acute coronary event secondary to rising potassium  NEXT OF KIN wife Devaughn Savant [phone]    SUMMARY OF RECOMMENDATIONS   DO NOT RESUSCITATE/ DO NOT INTUBATE Do everything up to this point including placement of bilateral nephrostomy tubes, antibiotics, fluids diagnostics, labs, transfusion He would not want to undergo hemodialysis on a long-term basis Palliative medicine to meet with wife tomorrow to update her on his current clinical status and address prognosis Code Status/Advance Care Planning:  DNR    Symptom Management:   Pain: Continue with fentanyl as needed. Monitor and titrate for effect  Palliative Prophylaxis:   Aspiration, Bowel Regimen, Delirium Protocol, Eye Care, Frequent Pain Assessment, Oral Care and Turn Reposition  Additional Recommendations (Limitations, Scope, Preferences):  No Chemotherapy, No Hemodialysis, No Radiation, No Surgical Procedures and No Tracheostomy  Psycho-social/Spiritual:   Desire for further Chaplaincy support:no  Additional Recommendations: Grief/Bereavement Support  Prognosis:   < 4 weeks in the setting of advanced metastatic bladder cancer, prostate cancer, now with bilateral hydronephrosis with a creatinine of 17.24 potassium 7.  Discharge Planning: To Be Determined      Primary Diagnoses: Present on Admission: . AKI (acute kidney injury) ([place]) . Acute lower  UTI . Coronary artery disease involving coronary bypass graft of native heart without angina pectoris . Benign essential HTN . CKD (chronic kidney disease), stage II . Normocytic anemia . Hematochezia . Prostate CA ([place]) . Bladder cancer ([place])   I have reviewed the medical record, interviewed the patient and family, and examined the patient. The following aspects are pertinent.  Past Medical History:  Diagnosis Date  . Bladder cancer ([place]) 05/2015  . CAD (coronary artery disease)   . Hyperlipidemia   . Hypertension   . Kidney stones   . Prostate CA  [facility])    Social History   Social History  . Marital status: Married    Spouse name: N/A  . Number of children: N/A  . Years of education: N/A   Social History Main Topics  . Smoking status: Former Research scientist (life sciences)  . Smokeless tobacco: Never Used  . Alcohol use No  . Drug use: No  . Sexual activity: Not Asked   Other Topics Concern  . None   Social History Narrative  . None   Family History  Problem Relation Age of Onset  . Congestive Heart Failure Mother   . Kidney failure Sister   . Hypertension Sister   . Diabetes Mellitus II Brother   . CAD Brother   . Lung cancer Brother   . Colon cancer Neg Hx   . Esophageal cancer Neg Hx    Scheduled Meds: . [START ON 02/24/2017] ceFEPime (MAXIPIME) IV  500 mg Intravenous Q24H  . fentaNYL      . lidocaine (PF)      . midazolam      . multivitamin with minerals  1 tablet Oral Daily  . rosuvastatin  10 mg Oral Daily   Continuous Infusions: . sodium chloride 75 mL/hr at 02/23/17 1300  .  sodium bicarbonate (isotonic) infusion in sterile water 125 mL/hr at 02/23/17 1300   PRN Meds:.acetaminophen, fentaNYL (SUBLIMAZE) injection, traMADol Medications Prior to Admission:  Prior to Admission medications   Medication Sig Start Date End Date Taking? Authorizing Provider  Multiple Vitamin (MULTIVITAMIN WITH MINERALS) TABS tablet Take 1 tablet by mouth daily.   Yes Historical Provider, MD  rosuvastatin (CRESTOR) 10 MG tablet Take 10 mg by mouth daily. pw 08/11/16  Yes Historical Provider, MD  traMADol (ULTRAM) 50 MG tablet Take 1 tablet (50 mg total) by mouth every 6 (six) hours as needed for moderate pain. 08/16/16  Yes Daniel J Angiulli, PA-C  aspirin EC 81 MG EC tablet Take 1 tablet (81 mg total) by mouth daily. Patient not taking: Reported on 02/22/2017 10/04/16   Murlean Iba, MD   Allergies  Allergen Reactions  . Gabapentin Other (See Comments)    Shaking  . Aspirin     Causes blood in urine? Per spouse   . Other Other (See  Comments)    Antihistamines-unknown reaction   Review of Systems  Physical Exam  Vital Signs: BP (!) 142/76 (BP Location: Right Arm)   Pulse 87   Temp 97.9 F (36.6 C) (Axillary)   Resp (!) 25   Ht 6\' 3"  (1.905 m)   Wt 98.4 kg (216 lb 14.9 oz)   SpO2 94%   BMI 27.11 kg/m  Pain Assessment: No/denies pain   Pain Score: 7    SpO2: SpO2: 94 % O2 Device:SpO2: 94 % O2 Flow Rate: .O2 Flow Rate (L/min): 2 L/min  IO: Intake/output summary:  Intake/Output Summary (Last 24 hours) at 02/23/17 1631 Last data filed at  02/23/17 1300  Gross per 24 hour  Intake          6142.92 ml  Output                0 ml  Net          6142.92 ml    LBM: Last BM Date:  (pt unable to tell me) Baseline Weight: Weight: 107 kg (235 lb 14.3 oz) Most recent weight: Weight: 98.4 kg (216 lb 14.9 oz)     Palliative Assessment/Data:   Flowsheet Rows     Most Recent Value  Intake Tab  Referral Department  Hospitalist  Unit at Time of Referral  Intermediate Care Unit  Palliative Care Primary Diagnosis  Cancer  Date Notified  02/23/17  Palliative Care Type  New Palliative care  Reason for referral  Clarify Goals of Care  Date of Admission  02/23/17  Date first seen by Palliative Care  02/23/17  # of days Palliative referral response time  0 Day(s)  # of days IP prior to Palliative referral  0  Clinical Assessment  Palliative Performance Scale Score  20%  Pain Max last 24 hours  Not able to report  Pain Min Last 24 hours  Not able to report  Dyspnea Max Last 24 Hours  Not able to report  Dyspnea Min Last 24 hours  Not able to report  Nausea Max Last 24 Hours  Not able to report  Nausea Min Last 24 Hours  Not able to report  Anxiety Max Last 24 Hours  Not able to report  Anxiety Min Last 24 Hours  Not able to report  Other Max Last 24 Hours  Not able to report  Psychosocial & Spiritual Assessment  Palliative Care Outcomes  Patient/Family meeting held?  Yes  Who was at the meeting?  wife    Palliative Care Outcomes  Clarified goals of care, Changed CPR status  Patient/Family wishes: Interventions discontinued/not started   Mechanical Ventilation  Palliative Care follow-up planned  Yes, Facility      Time In: 1200 Time Out: 1315 Time Total: 75 min Greater than 50%  of this time was spent counseling and coordinating care related to the above assessment and plan.Staffed with Dr. Allyson Sabal  Signed by: Dory Horn, NP   Please contact Palliative Medicine Team phone at [phone] for questions and concerns.  For individual provider: See Shea Evans

## 2017-02-24 DIAGNOSIS — C61 Malignant neoplasm of prostate: Secondary | ICD-10-CM

## 2017-02-24 LAB — COMPREHENSIVE METABOLIC PANEL
ALBUMIN: 1.7 g/dL — AB (ref 3.5–5.0)
ALK PHOS: 43 U/L (ref 38–126)
ALT: 9 U/L — ABNORMAL LOW (ref 17–63)
ANION GAP: 19 — AB (ref 5–15)
AST: 14 U/L — ABNORMAL LOW (ref 15–41)
BUN: 111 mg/dL — ABNORMAL HIGH (ref 6–20)
CHLORIDE: 100 mmol/L — AB (ref 101–111)
CO2: 20 mmol/L — AB (ref 22–32)
Calcium: 8.3 mg/dL — ABNORMAL LOW (ref 8.9–10.3)
Creatinine, Ser: 16.1 mg/dL — ABNORMAL HIGH (ref 0.61–1.24)
GFR calc non Af Amer: 2 mL/min — ABNORMAL LOW (ref >60)
GFR, EST AFRICAN AMERICAN: 3 mL/min — AB (ref >60)
GLUCOSE: 86 mg/dL (ref 65–99)
POTASSIUM: 4.9 mmol/L (ref 3.5–5.1)
SODIUM: 139 mmol/L (ref 135–145)
Total Bilirubin: 0.6 mg/dL (ref 0.3–1.2)
Total Protein: 5.8 g/dL — ABNORMAL LOW (ref 6.5–8.1)

## 2017-02-24 LAB — RENAL FUNCTION PANEL
ALBUMIN: 1.7 g/dL — AB (ref 3.5–5.0)
Anion gap: 17 — ABNORMAL HIGH (ref 5–15)
BUN: 110 mg/dL — ABNORMAL HIGH (ref 6–20)
CALCIUM: 8.2 mg/dL — AB (ref 8.9–10.3)
CHLORIDE: 103 mmol/L (ref 101–111)
CO2: 19 mmol/L — ABNORMAL LOW (ref 22–32)
Creatinine, Ser: 15.76 mg/dL — ABNORMAL HIGH (ref 0.61–1.24)
GFR, EST AFRICAN AMERICAN: 3 mL/min — AB (ref >60)
GFR, EST NON AFRICAN AMERICAN: 2 mL/min — AB (ref >60)
Glucose, Bld: 88 mg/dL (ref 65–99)
PHOSPHORUS: 8 mg/dL — AB (ref 2.5–4.6)
Potassium: 4.8 mmol/L (ref 3.5–5.1)
Sodium: 139 mmol/L (ref 135–145)

## 2017-02-24 LAB — CBC
HEMATOCRIT: 23.4 % — AB (ref 39.0–52.0)
Hemoglobin: 7.8 g/dL — ABNORMAL LOW (ref 13.0–17.0)
MCH: 27 pg (ref 26.0–34.0)
MCHC: 33.3 g/dL (ref 30.0–36.0)
MCV: 81 fL (ref 78.0–100.0)
Platelets: 240 10*3/uL (ref 150–400)
RBC: 2.89 MIL/uL — AB (ref 4.22–5.81)
RDW: 15.9 % — ABNORMAL HIGH (ref 11.5–15.5)
WBC: 11.2 10*3/uL — AB (ref 4.0–10.5)

## 2017-02-24 LAB — MAGNESIUM: Magnesium: 2.1 mg/dL (ref 1.7–2.4)

## 2017-02-24 LAB — URINE CULTURE: Culture: >=100000 — AB

## 2017-02-24 LAB — GLUCOSE, CAPILLARY: Glucose-Capillary: 84 mg/dL (ref 65–99)

## 2017-02-24 MED ORDER — SODIUM CHLORIDE 0.9 % IV SOLN
INTRAVENOUS | Status: DC
  Administered 2017-02-24: 75 mL/h via INTRAVENOUS
  Administered 2017-02-25 – 2017-02-26 (×2): via INTRAVENOUS

## 2017-02-24 NOTE — Progress Notes (Signed)
Patient ID: [name], male   DOB: 1931/12/03, 83 y.o.   MRN: [id number] S: feels a little better but sore O:BP 131/88 (BP Location: Right Arm)   Pulse 90   Temp 97.5 F (36.4 C) (Oral)   Resp 19   Ht 6\' 3"  (1.905 m)   Wt 102 kg (224 lb 13.9 oz)   SpO2 98%   BMI 28.11 kg/m   Intake/Output Summary (Last 24 hours) at 02/24/17 1001 Last data filed at 02/24/17 0800  Gross per 24 hour  Intake             5150 ml  Output             3200 ml  Net             1950 ml   Intake/Output: I/O last 3 completed shifts: In: 9682.9 [P.O.:300; I.V.:8202.9; Other:20; IV [id number]:9371] Out: 3200 [Urine:3200]  Intake/Output this shift:  Total I/O In: 710 [P.O.:120; I.V.:580; Other:10] Out: -  Weight change: -8.6 kg (-18 lb 15.4 oz) Gen:NAD CVS:no rub Resp: cta Abd: +BS, mildly tnder Ext: no edema   Recent Labs Lab 02/22/17 2134 02/23/17 0055 02/23/17 0450 02/23/17 0921 02/23/17 1150 02/24/17 0657  NA 132* 131* 135 135 136 139  139  K 6.8* 5.9* 5.9* 6.7* 7.0* 4.9  4.8  CL 104 105 105 104 105 100*  103  CO2 11* 11* 11* 14* 12* 20*  19*  GLUCOSE 90 141* 104* 97 85 86  88  BUN 121* 107* 101* 116* 122* 111*  110*  CREATININE 17.69* 16.84* 17.05* 17.24* 16.65* 16.10*  15.76*  ALBUMIN  --  2.1* 2.3*  --  1.8* 1.7*  1.7*  CALCIUM 9.3 8.6* 9.0 8.7* 8.6* 8.3*  8.2*  PHOS  --  8.2* 7.3*  --  7.6* 8.0*  AST  --   --   --   --   --  14*  ALT  --   --   --   --   --  9*   Liver Function Tests:  Recent Labs Lab 02/23/17 0450 02/23/17 1150 02/24/17 0657  AST  --   --  14*  ALT  --   --  9*  ALKPHOS  --   --  43  BILITOT  --   --  0.6  PROT  --   --  5.8*  ALBUMIN 2.3* 1.8* 1.7*  1.7*   No results for input(s): LIPASE, AMYLASE in the last 168 hours. No results for input(s): AMMONIA in the last 168 hours. CBC:  Recent Labs Lab 02/22/17 2134 02/23/17 0450 02/24/17 0657  WBC 15.0* 14.8* 11.2*  HGB 9.8* 8.4* 7.8*  HCT 28.4* 24.8* 23.4*  MCV 80.2 82.1 81.0  PLT  348 303 240   Cardiac Enzymes:  Recent Labs Lab 02/23/17 0055  CKTOTAL 47*   CBG:  Recent Labs Lab 02/23/17 0007 02/23/17 0132 02/23/17 0815 02/24/17 0854  GLUCAP 116* 108* 107* 84    Iron Studies: No results for input(s): IRON, TIBC, TRANSFERRIN, FERRITIN in the last 72 hours. Studies/Results: US Renal  Result Date: 02/23/2017 CLINICAL DATA:  Acute renal failure EXAM: RENAL / URINARY TRACT ULTRASOUND COMPLETE COMPARISON:  Renal ultrasound 09/02/2016 CT abdomen pelvis 07/28/2016 FINDINGS: Right Kidney: Length: 13.2 cm. There is severe right hydroureteronephrosis with the proximal ureter measuring up to 2.5 cm and the distal ureter measuring up 1.9 cm. No calculus is identified. No solid renal mass. Left Kidney: The left kidney  could not be visualized, as the patient was lying on his left side and could not be repositioned. Bladder: The urinary bladder is not distended. There is a masslike area of heterogeneous echotexture in the lower pelvis, measuring 9.1 x 7 x 8 cm. IMPRESSION: 1. Severe right hydroureteronephrosis. 2. Masslike area of heterogeneous echotexture at the expected location of the urinary bladder. Further evaluation with CT or MRI of the abdomen and pelvis is recommended. Electronically Signed   By: Ulyses Jarred [name]   On: 02/23/2017 01:57   Ct Renal Stone Study  Result Date: 02/23/2017 CLINICAL DATA:  Patient is dehydrated. No p.o. intake for 2 weeks. No urinary output. Penile infection and bleeding. History of stones. Bladder cancer. Recurrent prostate cancer. EXAM: CT ABDOMEN AND PELVIS WITHOUT CONTRAST TECHNIQUE: Multidetector CT imaging of the abdomen and pelvis was performed following the standard protocol without IV contrast. COMPARISON:  07/28/2016 FINDINGS: Lower chest: Small bilateral pleural effusions with basilar atelectasis. Fibrosis in the lung bases. Postoperative changes in the mediastinum consistent with bypass grafts. Hepatobiliary: Examination is limited  due to motion artifact. No focal liver lesions appreciated. Gallbladder and bile ducts are grossly unremarkable. Pancreas: Unremarkable. No pancreatic ductal dilatation or surrounding inflammatory changes. Spleen: Normal in size without focal abnormality. Adrenals/Urinary Tract: No adrenal gland nodules. There is prominent bilateral hydronephrosis and hydroureter. Hydronephrosis is progressing since prior study. There is a 9 mm stone in the distal right ureter. On the previous study, this stone was in the kidney. There is a heterogeneous mass involving the bladder with infiltration and a surrounding tissues. The mass may be contributing to bilateral ureteral obstruction. Difficult to separate the bladder from the mass on noncontrast imaging. Altogether, the bladder/mass measures about 6.6 x 9 cm. There is progression since the previous study. Stomach/Bowel: Stomach and small bowel are decompressed. Stool-filled colon without abnormal distention. Diverticulosis of the sigmoid colon with probable muscular hypertrophy. Difficult the separated portion of the sigmoid colon from the bladder mass and involvement of the colon is suspected. Appendix is not identified. Vascular/Lymphatic: Aortic atherosclerosis. No enlarged abdominal or pelvic lymph nodes. Reproductive: Postoperative changes in the prostate gland. Penile prosthesis. Other: Small left inguinal hernia containing fat. No free air or free fluid in the abdomen. Musculoskeletal: Degenerative changes in the spine and hips. No destructive bone lesions. IMPRESSION: Prominent bilateral hydronephrosis and hydroureter. There is a 9 mm stone in the distal right ureter. The bladder wall is irregular with prominent soft tissue, suggesting an infiltrating mass. This may be causing obstruction of the left ureter. The mass is progressing since previous study and now demonstrates likely invasion into the adjacent sigmoid colon. Prostate gland is surgically absent. Penile  prosthesis. Small bilateral pleural effusions with atelectasis or consolidation in the lung bases. Aortic atherosclerosis. Small left inguinal hernia containing fat. Electronically Signed   By: Lucienne Capers [name]   On: 02/23/2017 06:18   Ir Nephrostomy Placement Left  Result Date: 02/23/2017 INDICATION: 83 year old male with acute renal failure and bilateral obstructed pyonephrosis secondary to a primary bladder process, likely bladder cancer. He presents for urgent decompression with bilateral nephrostomy tubes. EXAM: IR NEPHROSTOMY PLACEMENT LEFT; IR NEPHROSTOMY PLACEMENT RIGHT COMPARISON:  CT abdomen/ pelvis 02/23/2017 MEDICATIONS: Patient has already received intravenous Rocephin and cefepime. No additional antibiotics were administered secondary to renal function. ANESTHESIA/SEDATION: Fentanyl 25 mcg IV; Versed 1.5 mg IV Moderate Sedation Time:  20 minutes The patient was continuously monitored during the procedure by the interventional radiology nurse under my direct supervision.  CONTRAST:  Approximately 10 mL Isovue 300 - administered into the collecting system(s) FLUOROSCOPY TIME:  Fluoroscopy Time: 2 minutes 30 seconds (27 mGy). COMPLICATIONS: None immediate. TECHNIQUE: The procedure, risks, benefits, and alternatives were explained to the patient. Questions regarding the procedure were encouraged and answered. The patient understands and consents to the procedure. The left flank was prepped with chlorhexidine in a sterile fashion, and a sterile drape was applied covering the operative field. A sterile gown and sterile gloves were used for the procedure. Local anesthesia was provided with 1% Lidocaine. The left flank was interrogated with ultrasound and the left kidney identified. The kidney is hydronephrotic. A suitable access site on the skin overlying the lower pole, posterior calix was identified. After local mg anesthesia was achieved, a small skin nick was made with an 11 blade scalpel. A 21  gauge Accustick needle was then advanced under direct sonographic guidance into the lower pole of the left kidney. There was return of frankly purulent urine through the needle hub. A 0.018 inch wire was advanced under fluoroscopic guidance into the left renal collecting system. The Accustick sheath was then advanced over the wire and a 0.018 system exchanged for a 0.035 system. Gentle hand injection of contrast material confirms placement of the sheath within the renal collecting system. There is significant hydronephrosis and irregularity of the collecting system. The tract from the scan into the renal collecting system was then dilated serially to 10-French. A 10-French Cook all-purpose drain was then placed and positioned under fluoroscopic guidance. The locking loop is well formed within the left renal pelvis. The catheter was secured to the skin with 2-0 Prolene and a sterile bandage was placed. Catheter was left to gravity bag drainage. Attention was next turned to the right kidney. The right flank was prepped with chlorhexidine in a sterile fashion, and a sterile drape was applied covering the operative field. A sterile gown and sterile gloves were used for the procedure. Local anesthesia was provided with 1% Lidocaine. The right flank was interrogated with ultrasound and the left kidney identified. The kidney is hydronephrotic. A suitable access site on the skin overlying the lower pole, posterior calix was identified. After local mg anesthesia was achieved, a small skin nick was made with an 11 blade scalpel. A 21 gauge Accustick needle was then advanced under direct sonographic guidance into the lower pole of the right kidney. There was return of frankly purulent urine. A sample was aspirated and sent for culture. A 0.018 inch wire was advanced under fluoroscopic guidance into the left renal collecting system. The Accustick sheath was then advanced over the wire and a 0.018 system exchanged for a 0.035  system. Gentle hand injection of contrast material confirms placement of the sheath within the renal collecting system. There is significant hydronephrosis and irregularity of the collecting system. The tract from the scan into the renal collecting system was then dilated serially to 10-French. A 10-French Cook all-purpose drain was then placed and positioned under fluoroscopic guidance. The locking loop is well formed within the left renal pelvis. The catheter was secured to the skin with 2-0 Prolene and a sterile bandage was placed. Catheter was left to gravity bag drainage. IMPRESSION: Successful placement of bilateral 10 French percutaneous nephrostomy tubes. Urine is frankly purulent bilaterally. A sample was obtained and sent for culture. Signed, Criselda Peaches, MD [facility] Electronically Signed   By: Jacqulynn Cadet [name]   On: 02/23/2017 16:12  Ir Nephrostomy Placement Right  Result Date: 02/23/2017 INDICATION: 83 year old male with acute renal failure and bilateral obstructed pyonephrosis secondary to a primary bladder process, likely bladder cancer. He presents for urgent decompression with bilateral nephrostomy tubes. EXAM: IR NEPHROSTOMY PLACEMENT LEFT; IR NEPHROSTOMY PLACEMENT RIGHT COMPARISON:  CT abdomen/ pelvis 02/23/2017 MEDICATIONS: Patient has already received intravenous Rocephin and cefepime. No additional antibiotics were administered secondary to renal function. ANESTHESIA/SEDATION: Fentanyl 25 mcg IV; Versed 1.5 mg IV Moderate Sedation Time:  20 minutes The patient was continuously monitored during the procedure by the interventional radiology nurse under my direct supervision. CONTRAST:  Approximately 10 mL Isovue 300 - administered into the collecting system(s) FLUOROSCOPY TIME:  Fluoroscopy Time: 2 minutes 30 seconds (27 mGy). COMPLICATIONS: None immediate. TECHNIQUE: The procedure, risks, benefits, and alternatives  were explained to the patient. Questions regarding the procedure were encouraged and answered. The patient understands and consents to the procedure. The left flank was prepped with chlorhexidine in a sterile fashion, and a sterile drape was applied covering the operative field. A sterile gown and sterile gloves were used for the procedure. Local anesthesia was provided with 1% Lidocaine. The left flank was interrogated with ultrasound and the left kidney identified. The kidney is hydronephrotic. A suitable access site on the skin overlying the lower pole, posterior calix was identified. After local mg anesthesia was achieved, a small skin nick was made with an 11 blade scalpel. A 21 gauge Accustick needle was then advanced under direct sonographic guidance into the lower pole of the left kidney. There was return of frankly purulent urine through the needle hub. A 0.018 inch wire was advanced under fluoroscopic guidance into the left renal collecting system. The Accustick sheath was then advanced over the wire and a 0.018 system exchanged for a 0.035 system. Gentle hand injection of contrast material confirms placement of the sheath within the renal collecting system. There is significant hydronephrosis and irregularity of the collecting system. The tract from the scan into the renal collecting system was then dilated serially to 10-French. A 10-French Cook all-purpose drain was then placed and positioned under fluoroscopic guidance. The locking loop is well formed within the left renal pelvis. The catheter was secured to the skin with 2-0 Prolene and a sterile bandage was placed. Catheter was left to gravity bag drainage. Attention was next turned to the right kidney. The right flank was prepped with chlorhexidine in a sterile fashion, and a sterile drape was applied covering the operative field. A sterile gown and sterile gloves were used for the procedure. Local anesthesia was provided with 1% Lidocaine. The  right flank was interrogated with ultrasound and the left kidney identified. The kidney is hydronephrotic. A suitable access site on the skin overlying the lower pole, posterior calix was identified. After local mg anesthesia was achieved, a small skin nick was made with an 11 blade scalpel. A 21 gauge Accustick needle was then advanced under direct sonographic guidance into the lower pole of the right kidney. There was return of frankly purulent urine. A sample was aspirated and sent for culture. A 0.018 inch wire was advanced under fluoroscopic guidance into the left renal collecting system. The Accustick sheath was then advanced over the wire and a 0.018 system exchanged for a 0.035 system. Gentle hand injection of contrast material confirms placement of the sheath within the renal collecting system. There is significant hydronephrosis and irregularity of the collecting system. The tract from the scan into the renal collecting system was then  dilated serially to 10-French. A 10-French Cook all-purpose drain was then placed and positioned under fluoroscopic guidance. The locking loop is well formed within the left renal pelvis. The catheter was secured to the skin with 2-0 Prolene and a sterile bandage was placed. Catheter was left to gravity bag drainage. IMPRESSION: Successful placement of bilateral 10 French percutaneous nephrostomy tubes. Urine is frankly purulent bilaterally. A sample was obtained and sent for culture. Signed, Criselda Peaches, MD [facility] Radiology Electronically Signed   By: Jacqulynn Cadet [name]   On: 02/23/2017 16:12   . ceFEPime (MAXIPIME) IV  500 mg Intravenous Q24H  . multivitamin with minerals  1 tablet Oral Daily    BMET    Component Value Date/Time   NA 139 02/24/2017 0657   NA 139 02/24/2017 0657   K 4.8 02/24/2017 0657   K 4.9 02/24/2017 0657   CL 103 02/24/2017 0657   CL 100 (L) 02/24/2017 0657   CO2 19 (L)  02/24/2017 0657   CO2 20 (L) 02/24/2017 0657   GLUCOSE 88 02/24/2017 0657   GLUCOSE 86 02/24/2017 0657   BUN 110 (H) 02/24/2017 0657   BUN 111 (H) 02/24/2017 0657   CREATININE 15.76 (H) 02/24/2017 0657   CREATININE 16.10 (H) 02/24/2017 0657   CALCIUM 8.2 (L) 02/24/2017 0657   CALCIUM 8.3 (L) 02/24/2017 0657   GFRNONAA 2 (L) 02/24/2017 0657   GFRNONAA 2 (L) 02/24/2017 0657   GFRAA 3 (L) 02/24/2017 0657   GFRAA 3 (L) 02/24/2017 0657   CBC    Component Value Date/Time   WBC 11.2 (H) 02/24/2017 0657   RBC 2.89 (L) 02/24/2017 0657   HGB 7.8 (L) 02/24/2017 0657   HCT 23.4 (L) 02/24/2017 0657   PLT 240 02/24/2017 0657   MCV 81.0 02/24/2017 0657   MCH 27.0 02/24/2017 0657   MCHC 33.3 02/24/2017 0657   RDW 15.9 (H) 02/24/2017 0657   LYMPHSABS 2.4 12/08/2016 1754   MONOABS 1.4 (H) 12/08/2016 1754   EOSABS 0.3 12/08/2016 1754   BASOSABS 0.0 12/08/2016 1754     Assessment/Plan: 1. AKI/CKD- in setting of volume depletion and bladder outlet obstruction due to recurrent bladder cancer.  Renal US with severe right hydroureteronephrosis with mass-like lesion in urinary bladder.   CT scan reveals bilateral hydronephrosis and hydroureter with 68mm stone in distal right ureter.  Bladder wall is irregular and infiltrating mass.  Appreciate Urology's and IR's input and assistance in placing bilateral percutaneous nephrostomy tubes 02/23/17.   1. Continue with IVF's.  2. bilateral perc nephrostomy tube placement 02/23/17 with marked increase in urine output 3. Will hold off on HD as this can be treated more appropriately with bilateral PNT's and patient is a poor longterm dialysis candidate (he also does not wish to go on HD)  2. Hyperkalemia- due to #1 and metabolic acidosis. Treated with IV calcium, insulin/D50 and now on bicarb drip.  1. Repeat potassium at goal.  2. Resolved after IR placed bilateral PNT"s 3. Metabolic acidosis- due to #1, on isotonic bicarb. Follow  labs. 4. Urosepsis/Pyuria/hematuria- Urine Cx + for diptheroids no sensitivities reported, empiric antibiotics per primary 5. Bladder cancer- per urology 6. Prostate cancer- per urology 7. Nephrolithiasis with obstruction on right.  PNT's as above. 8. Anemia- normocytic with h/o bladder and prostate cancer as well as ckd stage 2-3 and gross hematuria and hematochezia.  9. Bloody stools- guaiac stools and follow H/H  10. Abdominal pain- unclear if this is  related to his bladder cancer, UTI, or GI process. May benefit from imaging studies to evaluate and/or GI consult if he does have heme + stools. 11. CAD- currently asymptomatic 12. FTT- has not been doing well for several months, likely related to his malignancies and advanced age. 13. Disposition- appreciate Palliative care consult to help set goals/limits of care given his recurrent bladder cancer and failure to thrive over the last few months.  He is now DNR and not long term dialysis candidate.  Continue with supportive measures. Donetta Potts, MD [employer] [phone]

## 2017-02-24 NOTE — Progress Notes (Signed)
Referring Physician(s):  Dr. Alyson Ingles  Supervising Physician: Jacqulynn Cadet  Patient Status:  [facility] - In-pt  Chief Complaint:  Urinary retention  Subjective: More alert today. Somewhat confused.  Allergies: Gabapentin; Aspirin; and Other  Medications: Prior to Admission medications   Medication Sig Start Date End Date Taking? Authorizing Provider  Multiple Vitamin (MULTIVITAMIN WITH MINERALS) TABS tablet Take 1 tablet by mouth daily.   Yes Historical Provider, MD  rosuvastatin (CRESTOR) 10 MG tablet Take 10 mg by mouth daily. pw 08/11/16  Yes Historical Provider, MD  traMADol (ULTRAM) 50 MG tablet Take 1 tablet (50 mg total) by mouth every 6 (six) hours as needed for moderate pain. 08/16/16  Yes Daniel J Angiulli, PA-C  aspirin EC 81 MG EC tablet Take 1 tablet (81 mg total) by mouth daily. Patient not taking: Reported on 02/22/2017 10/04/16   Clanford Marisa Hua, MD     Vital Signs: BP 122/71 (BP Location: Right Arm)   Pulse 84   Temp 97.8 F (36.6 C) (Axillary)   Resp 16   Ht 6\' 3"  (1.905 m)   Wt 224 lb 13.9 oz (102 kg)   SpO2 96%   BMI 28.11 kg/m   Physical Exam  NAD, alert Abd:  Bilateral nephrostomy tubes in place with blood-tinged urine.  Insertion sites c/d/I.  No tenderness.  Imaging: US Renal  Result Date: 02/23/2017 CLINICAL DATA:  Acute renal failure EXAM: RENAL / URINARY TRACT ULTRASOUND COMPLETE COMPARISON:  Renal ultrasound 09/02/2016 CT abdomen pelvis 07/28/2016 FINDINGS: Right Kidney: Length: 13.2 cm. There is severe right hydroureteronephrosis with the proximal ureter measuring up to 2.5 cm and the distal ureter measuring up 1.9 cm. No calculus is identified. No solid renal mass. Left Kidney: The left kidney could not be visualized, as the patient was lying on his left side and could not be repositioned. Bladder: The urinary bladder is not distended. There is a masslike area of heterogeneous echotexture in the lower pelvis, measuring 9.1 x 7 x 8 cm.  IMPRESSION: 1. Severe right hydroureteronephrosis. 2. Masslike area of heterogeneous echotexture at the expected location of the urinary bladder. Further evaluation with CT or MRI of the abdomen and pelvis is recommended. Electronically Signed   By: Ulyses Jarred [name]   On: 02/23/2017 01:57   Ct Renal Stone Study  Result Date: 02/23/2017 CLINICAL DATA:  Patient is dehydrated. No p.o. intake for 2 weeks. No urinary output. Penile infection and bleeding. History of stones. Bladder cancer. Recurrent prostate cancer. EXAM: CT ABDOMEN AND PELVIS WITHOUT CONTRAST TECHNIQUE: Multidetector CT imaging of the abdomen and pelvis was performed following the standard protocol without IV contrast. COMPARISON:  07/28/2016 FINDINGS: Lower chest: Small bilateral pleural effusions with basilar atelectasis. Fibrosis in the lung bases. Postoperative changes in the mediastinum consistent with bypass grafts. Hepatobiliary: Examination is limited due to motion artifact. No focal liver lesions appreciated. Gallbladder and bile ducts are grossly unremarkable. Pancreas: Unremarkable. No pancreatic ductal dilatation or surrounding inflammatory changes. Spleen: Normal in size without focal abnormality. Adrenals/Urinary Tract: No adrenal gland nodules. There is prominent bilateral hydronephrosis and hydroureter. Hydronephrosis is progressing since prior study. There is a 9 mm stone in the distal right ureter. On the previous study, this stone was in the kidney. There is a heterogeneous mass involving the bladder with infiltration and a surrounding tissues. The mass may be contributing to bilateral ureteral obstruction. Difficult to separate the bladder from the mass on noncontrast imaging. Altogether, the bladder/mass measures about 6.6 x 9  cm. There is progression since the previous study. Stomach/Bowel: Stomach and small bowel are decompressed. Stool-filled colon without abnormal distention. Diverticulosis of the sigmoid colon with  probable muscular hypertrophy. Difficult the separated portion of the sigmoid colon from the bladder mass and involvement of the colon is suspected. Appendix is not identified. Vascular/Lymphatic: Aortic atherosclerosis. No enlarged abdominal or pelvic lymph nodes. Reproductive: Postoperative changes in the prostate gland. Penile prosthesis. Other: Small left inguinal hernia containing fat. No free air or free fluid in the abdomen. Musculoskeletal: Degenerative changes in the spine and hips. No destructive bone lesions. IMPRESSION: Prominent bilateral hydronephrosis and hydroureter. There is a 9 mm stone in the distal right ureter. The bladder wall is irregular with prominent soft tissue, suggesting an infiltrating mass. This may be causing obstruction of the left ureter. The mass is progressing since previous study and now demonstrates likely invasion into the adjacent sigmoid colon. Prostate gland is surgically absent. Penile prosthesis. Small bilateral pleural effusions with atelectasis or consolidation in the lung bases. Aortic atherosclerosis. Small left inguinal hernia containing fat. Electronically Signed   By: Lucienne Capers [name]   On: 02/23/2017 06:18   Ir Nephrostomy Placement Left  Result Date: 02/23/2017 INDICATION: 83 year old male with acute renal failure and bilateral obstructed pyonephrosis secondary to a primary bladder process, likely bladder cancer. He presents for urgent decompression with bilateral nephrostomy tubes. EXAM: IR NEPHROSTOMY PLACEMENT LEFT; IR NEPHROSTOMY PLACEMENT RIGHT COMPARISON:  CT abdomen/ pelvis 02/23/2017 MEDICATIONS: Patient has already received intravenous Rocephin and cefepime. No additional antibiotics were administered secondary to renal function. ANESTHESIA/SEDATION: Fentanyl 25 mcg IV; Versed 1.5 mg IV Moderate Sedation Time:  20 minutes The patient was continuously monitored during the procedure by the interventional radiology nurse under my direct supervision.  CONTRAST:  Approximately 10 mL Isovue 300 - administered into the collecting system(s) FLUOROSCOPY TIME:  Fluoroscopy Time: 2 minutes 30 seconds (27 mGy). COMPLICATIONS: None immediate. TECHNIQUE: The procedure, risks, benefits, and alternatives were explained to the patient. Questions regarding the procedure were encouraged and answered. The patient understands and consents to the procedure. The left flank was prepped with chlorhexidine in a sterile fashion, and a sterile drape was applied covering the operative field. A sterile gown and sterile gloves were used for the procedure. Local anesthesia was provided with 1% Lidocaine. The left flank was interrogated with ultrasound and the left kidney identified. The kidney is hydronephrotic. A suitable access site on the skin overlying the lower pole, posterior calix was identified. After local mg anesthesia was achieved, a small skin nick was made with an 11 blade scalpel. A 21 gauge Accustick needle was then advanced under direct sonographic guidance into the lower pole of the left kidney. There was return of frankly purulent urine through the needle hub. A 0.018 inch wire was advanced under fluoroscopic guidance into the left renal collecting system. The Accustick sheath was then advanced over the wire and a 0.018 system exchanged for a 0.035 system. Gentle hand injection of contrast material confirms placement of the sheath within the renal collecting system. There is significant hydronephrosis and irregularity of the collecting system. The tract from the scan into the renal collecting system was then dilated serially to 10-French. A 10-French Cook all-purpose drain was then placed and positioned under fluoroscopic guidance. The locking loop is well formed within the left renal pelvis. The catheter was secured to the skin with 2-0 Prolene and a sterile bandage was placed. Catheter was left to gravity bag drainage. Attention was next  turned to the right kidney. The  right flank was prepped with chlorhexidine in a sterile fashion, and a sterile drape was applied covering the operative field. A sterile gown and sterile gloves were used for the procedure. Local anesthesia was provided with 1% Lidocaine. The right flank was interrogated with ultrasound and the left kidney identified. The kidney is hydronephrotic. A suitable access site on the skin overlying the lower pole, posterior calix was identified. After local mg anesthesia was achieved, a small skin nick was made with an 11 blade scalpel. A 21 gauge Accustick needle was then advanced under direct sonographic guidance into the lower pole of the right kidney. There was return of frankly purulent urine. A sample was aspirated and sent for culture. A 0.018 inch wire was advanced under fluoroscopic guidance into the left renal collecting system. The Accustick sheath was then advanced over the wire and a 0.018 system exchanged for a 0.035 system. Gentle hand injection of contrast material confirms placement of the sheath within the renal collecting system. There is significant hydronephrosis and irregularity of the collecting system. The tract from the scan into the renal collecting system was then dilated serially to 10-French. A 10-French Cook all-purpose drain was then placed and positioned under fluoroscopic guidance. The locking loop is well formed within the left renal pelvis. The catheter was secured to the skin with 2-0 Prolene and a sterile bandage was placed. Catheter was left to gravity bag drainage. IMPRESSION: Successful placement of bilateral 10 French percutaneous nephrostomy tubes. Urine is frankly purulent bilaterally. A sample was obtained and sent for culture. Signed, Criselda Peaches, MD [facility] Electronically Signed   By: Jacqulynn Cadet [name]   On: 02/23/2017 16:12   Ir Nephrostomy Placement Right  Result Date: 02/23/2017 INDICATION:  83 year old male with acute renal failure and bilateral obstructed pyonephrosis secondary to a primary bladder process, likely bladder cancer. He presents for urgent decompression with bilateral nephrostomy tubes. EXAM: IR NEPHROSTOMY PLACEMENT LEFT; IR NEPHROSTOMY PLACEMENT RIGHT COMPARISON:  CT abdomen/ pelvis 02/23/2017 MEDICATIONS: Patient has already received intravenous Rocephin and cefepime. No additional antibiotics were administered secondary to renal function. ANESTHESIA/SEDATION: Fentanyl 25 mcg IV; Versed 1.5 mg IV Moderate Sedation Time:  20 minutes The patient was continuously monitored during the procedure by the interventional radiology nurse under my direct supervision. CONTRAST:  Approximately 10 mL Isovue 300 - administered into the collecting system(s) FLUOROSCOPY TIME:  Fluoroscopy Time: 2 minutes 30 seconds (27 mGy). COMPLICATIONS: None immediate. TECHNIQUE: The procedure, risks, benefits, and alternatives were explained to the patient. Questions regarding the procedure were encouraged and answered. The patient understands and consents to the procedure. The left flank was prepped with chlorhexidine in a sterile fashion, and a sterile drape was applied covering the operative field. A sterile gown and sterile gloves were used for the procedure. Local anesthesia was provided with 1% Lidocaine. The left flank was interrogated with ultrasound and the left kidney identified. The kidney is hydronephrotic. A suitable access site on the skin overlying the lower pole, posterior calix was identified. After local mg anesthesia was achieved, a small skin nick was made with an 11 blade scalpel. A 21 gauge Accustick needle was then advanced under direct sonographic guidance into the lower pole of the left kidney. There was return of frankly purulent urine through the needle hub. A 0.018 inch wire was advanced under fluoroscopic guidance into the left renal collecting system. The Accustick sheath was then  advanced over  the wire and a 0.018 system exchanged for a 0.035 system. Gentle hand injection of contrast material confirms placement of the sheath within the renal collecting system. There is significant hydronephrosis and irregularity of the collecting system. The tract from the scan into the renal collecting system was then dilated serially to 10-French. A 10-French Cook all-purpose drain was then placed and positioned under fluoroscopic guidance. The locking loop is well formed within the left renal pelvis. The catheter was secured to the skin with 2-0 Prolene and a sterile bandage was placed. Catheter was left to gravity bag drainage. Attention was next turned to the right kidney. The right flank was prepped with chlorhexidine in a sterile fashion, and a sterile drape was applied covering the operative field. A sterile gown and sterile gloves were used for the procedure. Local anesthesia was provided with 1% Lidocaine. The right flank was interrogated with ultrasound and the left kidney identified. The kidney is hydronephrotic. A suitable access site on the skin overlying the lower pole, posterior calix was identified. After local mg anesthesia was achieved, a small skin nick was made with an 11 blade scalpel. A 21 gauge Accustick needle was then advanced under direct sonographic guidance into the lower pole of the right kidney. There was return of frankly purulent urine. A sample was aspirated and sent for culture. A 0.018 inch wire was advanced under fluoroscopic guidance into the left renal collecting system. The Accustick sheath was then advanced over the wire and a 0.018 system exchanged for a 0.035 system. Gentle hand injection of contrast material confirms placement of the sheath within the renal collecting system. There is significant hydronephrosis and irregularity of the collecting system. The tract from the scan into the renal collecting system was then dilated serially to 10-French. A 10-French Cook  all-purpose drain was then placed and positioned under fluoroscopic guidance. The locking loop is well formed within the left renal pelvis. The catheter was secured to the skin with 2-0 Prolene and a sterile bandage was placed. Catheter was left to gravity bag drainage. IMPRESSION: Successful placement of bilateral 10 French percutaneous nephrostomy tubes. Urine is frankly purulent bilaterally. A sample was obtained and sent for culture. Signed, Criselda Peaches, MD [facility] Radiology Electronically Signed   By: Jacqulynn Cadet [name]   On: 02/23/2017 16:12    Labs:  CBC:  Recent Labs  12/08/16 1754 02/22/17 2134 02/23/17 0450 02/24/17 0657  WBC 10.7* 15.0* 14.8* 11.2*  HGB 9.9* 9.8* 8.4* 7.8*  HCT 30.8* 28.4* 24.8* 23.4*  PLT 350 348 303 240    COAGS:  Recent Labs  10/01/16 1948 02/23/17 1420  INR 1.48 1.55  APTT 31  --     BMP:  Recent Labs  02/23/17 0450 02/23/17 0921 02/23/17 1150 02/24/17 0657  NA 135 135 136 139  139  K 5.9* 6.7* 7.0* 4.9  4.8  CL 105 104 105 100*  103  CO2 11* 14* 12* 20*  19*  GLUCOSE 104* 97 85 86  88  BUN 101* 116* 122* 111*  110*  CALCIUM 9.0 8.7* 8.6* 8.3*  8.2*  CREATININE 17.05* 17.24* 16.65* 16.10*  15.76*  GFRNONAA 2* 2* 2* 2*  2*  GFRAA 3* 2* 3* 3*  3*    LIVER FUNCTION TESTS:  Recent Labs  09/01/16 1724 10/01/16 1618 12/08/16 1754 02/23/17 0055 02/23/17 0450 02/23/17 1150 02/24/17 0657  BILITOT 0.6 0.4 0.5  --   --   --  0.6  AST 23 55* 18  --   --   --  14*  ALT 18 35 10*  --   --   --  9*  ALKPHOS 53 62 48  --   --   --  43  PROT 7.9 8.1 7.6  --   --   --  5.8*  ALBUMIN 3.1* 2.7* 2.7* 2.1* 2.3* 1.8* 1.7*  1.7*    Assessment and Plan: Bilateral hydronephrosis s/p bilateral percutaneous nephrostomy tube placement 3/17. Drains remain in place. Blood-tinged output in both drains. WBC improved to 11.2 today.  Cultures pending.  Plan per urology.    IR to follow.   Electronically Signed: Docia Barrier 02/24/2017, 3:03 PM   I spent a total of 15 Minutes at the the patient's bedside AND on the patient's hospital floor or unit, greater than 50% of which was counseling/coordinating care for bilateral hydronephrosis.

## 2017-02-24 NOTE — Progress Notes (Addendum)
[facility] PROGRESS NOTE  [name] OZD:[id number] DOB: [birth year] DOA: 02/22/2017   PCP: [name], MD     Assessment/Plan: Principal Problem:   AKI (acute kidney injury) ([place]) Active Problems:   Benign essential HTN   Coronary artery disease involving coronary bypass graft of native heart without angina pectoris   CKD (chronic kidney disease), stage II   Normocytic anemia   Acute lower UTI   Bladder cancer ([place])   Hematochezia   Prostate CA ([place])   Acidemia   Palliative care encounter  [name] with and extensive PMH significant for HTN, CAD s/p CABG, prostate cancer s/p multiple TURBT with recurrent UTI's, as well as bladder cancer (receives Urologic care through the [facility] and has had "scraping of the bladder" several times) who was brought to [facility] with a 2 week history of malaise, anorexia, and FTT.  His wife reports that he had diarrhea for the last week and also had some blood in his stools.Labs upon arrival were notable for a potassium of 6.8 and a BUN/Cr of 121/17.69. Previously creatinine was 1.73.   nephrology consulted to further evaluate and manage his AKI/CKD and hyperkalemia.  urology consulted for Foley placement. Patient is followed by Caren Macadam, MD , urologist at [facility] and plan  AKI/CKD in the setting of obstructive uropathy-, bladder cancer, prostate cancer  in setting of volume depletion and possible bladder outlet obstruction in the setting of prostate and bladder cancer. Continue IVF's , urology consulted for Foley placement. Multiple attempts were made to place a foley catheter which were unsuccessful due to the large bladder mass.  patient declined cystoscopy in November of last year.    .   status post IR placement of  bilateral perc nephrostomy. He is not a candidate for long-term hemodialysis. Per palliative care continue all medical measures, including fluids, antibiotics, blood  transfusions  Hyperkalemia- due to #1 and metabolic acidosis.  Treated with IV calcium, insulin/D50 and now on bicarb drip.  Repeat labs and hopefully we can avoid HD with conservative therapy.  Per ED MD no EKG changes consistent with Hyperkalemia. Received several doses of Kayexalate 3/17    Pyuria/hematuria- will need to send Urine for Cx and sensitivity, continue cefepime  Bladder cancer,Prostate cancer-followed at [facility] [place] urology consulted this admission  Anemia- normocytic with h/o bladder and prostate cancer as well as ckd stage 2-3 and gross hematuria and hematochezia. Hemoglobin at baseline around 9.5, hemoglobin dropped to 7.8 today. CT abdomen shows bilateral hydronephrosis and hydroureter, likely causing abdominal pain. Bladder mass is progressing, likely invasion into the adjacent sigmoid colon.      DVT prophylaxsis SCDs  Code Status:  DO NOT RESUSCITATE    Family Communication: Discussed in detail with the patient, all imaging results, lab results explained to the patient   Disposition Plan:  Anticipate  decline in the next few days      Consultants:  Urology  Nephrology  Palliative care  Procedures:  None  Antibiotics: Anti-infectives    Start     Dose/Rate Route Frequency Ordered Stop   02/24/17 0600  ceFEPIme (MAXIPIME) 500 mg in dextrose 5 % 50 mL IVPB     500 mg 100 mL/hr over 30 Minutes Intravenous Every 24 hours 02/23/17 0447     02/23/17 1500  cefTRIAXone (ROCEPHIN) 2 g in dextrose 5 % 50 mL IVPB  Status:  Discontinued     2 g  100 mL/hr over 30 Minutes Intravenous  Once 02/23/17 1455 02/23/17 1611   02/23/17 0500  ceFEPIme (MAXIPIME) 2 g in dextrose 5 % 50 mL IVPB     2 g 100 mL/hr over 30 Minutes Intravenous  Once 02/23/17 0445 02/23/17 0618   02/23/17 0230  cefTRIAXone (ROCEPHIN) 2 g in dextrose 5 % 50 mL IVPB     2 g 100 mL/hr over 30 Minutes Intravenous  Once 02/23/17 0217 02/23/17 0329         HPI/Subjective: Very  lethargic , but arousable , generally uncomfortable, cannot localize his pain  Objective: Vitals:   02/23/17 1953 02/24/17 0012 02/24/17 0300 02/24/17 0400  BP: 129/71 (!) 153/88  131/88  Pulse: 82 90  90  Resp: 19 18  19   Temp: 98.2 F (36.8 C) 98.4 F (36.9 C)  98.2 F (36.8 C)  TempSrc: Oral Oral  Oral  SpO2: 98% 99%  98%  Weight:   102 kg (224 lb 13.9 oz)   Height:        Intake/Output Summary (Last 24 hours) at 02/24/17 0746 Last data filed at 02/24/17 0506  Gross per 24 hour  Intake          5412.92 ml  Output             3200 ml  Net          2212.92 ml    Exam:  Examination:  General exam: Appears calm and comfortable  Respiratory system: Clear to auscultation. Respiratory effort normal. Cardiovascular system: S1 & S2 heard, RRR. No JVD, murmurs, rubs, gallops or clicks. No pedal edema. Gastrointestinal system: Abdomen is nondistended, soft and nontender. No organomegaly or masses felt. Normal bowel sounds heard. Central nervous system: Alert and oriented. No focal neurological deficits. Extremities: Symmetric 5 x 5 power. Skin: No rashes, lesions or ulcers Psychiatry: Judgement and insight appear normal. Mood & affect appropriate.     Data Reviewed: I have personally reviewed following labs and imaging studies  Micro Results Recent Results (from the past 240 hour(s))  Urine culture     Status: None (Preliminary result)   Collection Time: 02/23/17 12:47 AM  Result Value Ref Range Status   Specimen Description URINE, CLEAN CATCH  Final   Special Requests NONE  Final   Culture   Final    CULTURE REINCUBATED FOR BETTER GROWTH Performed at [facility], [address], [place], [place] [address]    Report Status PENDING  Incomplete  MRSA PCR Screening     Status: None   Collection Time: 02/23/17  6:44 AM  Result Value Ref Range Status   MRSA by PCR NEGATIVE NEGATIVE Final    Comment:        The GeneXpert MRSA Assay (FDA approved for NASAL  specimens only), is one component of a comprehensive MRSA colonization surveillance program. It is not intended to diagnose MRSA infection nor to guide or monitor treatment for MRSA infections.     Radiology Reports US Renal  Result Date: 02/23/2017 CLINICAL DATA:  Acute renal failure EXAM: RENAL / URINARY TRACT ULTRASOUND COMPLETE COMPARISON:  Renal ultrasound 09/02/2016 CT abdomen pelvis 07/28/2016 FINDINGS: Right Kidney: Length: 13.2 cm. There is severe right hydroureteronephrosis with the proximal ureter measuring up to 2.5 cm and the distal ureter measuring up 1.9 cm. No calculus is identified. No solid renal mass. Left Kidney: The left kidney could not be visualized, as the patient was lying on his left side and  could not be repositioned. Bladder: The urinary bladder is not distended. There is a masslike area of heterogeneous echotexture in the lower pelvis, measuring 9.1 x 7 x 8 cm. IMPRESSION: 1. Severe right hydroureteronephrosis. 2. Masslike area of heterogeneous echotexture at the expected location of the urinary bladder. Further evaluation with CT or MRI of the abdomen and pelvis is recommended. Electronically Signed   By: Ulyses Jarred [name]   On: 02/23/2017 01:57   Ct Renal Stone Study  Result Date: 02/23/2017 CLINICAL DATA:  Patient is dehydrated. No p.o. intake for 2 weeks. No urinary output. Penile infection and bleeding. History of stones. Bladder cancer. Recurrent prostate cancer. EXAM: CT ABDOMEN AND PELVIS WITHOUT CONTRAST TECHNIQUE: Multidetector CT imaging of the abdomen and pelvis was performed following the standard protocol without IV contrast. COMPARISON:  07/28/2016 FINDINGS: Lower chest: Small bilateral pleural effusions with basilar atelectasis. Fibrosis in the lung bases. Postoperative changes in the mediastinum consistent with bypass grafts. Hepatobiliary: Examination is limited due to motion artifact. No focal liver lesions appreciated. Gallbladder and bile ducts are  grossly unremarkable. Pancreas: Unremarkable. No pancreatic ductal dilatation or surrounding inflammatory changes. Spleen: Normal in size without focal abnormality. Adrenals/Urinary Tract: No adrenal gland nodules. There is prominent bilateral hydronephrosis and hydroureter. Hydronephrosis is progressing since prior study. There is a 9 mm stone in the distal right ureter. On the previous study, this stone was in the kidney. There is a heterogeneous mass involving the bladder with infiltration and a surrounding tissues. The mass may be contributing to bilateral ureteral obstruction. Difficult to separate the bladder from the mass on noncontrast imaging. Altogether, the bladder/mass measures about 6.6 x 9 cm. There is progression since the previous study. Stomach/Bowel: Stomach and small bowel are decompressed. Stool-filled colon without abnormal distention. Diverticulosis of the sigmoid colon with probable muscular hypertrophy. Difficult the separated portion of the sigmoid colon from the bladder mass and involvement of the colon is suspected. Appendix is not identified. Vascular/Lymphatic: Aortic atherosclerosis. No enlarged abdominal or pelvic lymph nodes. Reproductive: Postoperative changes in the prostate gland. Penile prosthesis. Other: Small left inguinal hernia containing fat. No free air or free fluid in the abdomen. Musculoskeletal: Degenerative changes in the spine and hips. No destructive bone lesions. IMPRESSION: Prominent bilateral hydronephrosis and hydroureter. There is a 9 mm stone in the distal right ureter. The bladder wall is irregular with prominent soft tissue, suggesting an infiltrating mass. This may be causing obstruction of the left ureter. The mass is progressing since previous study and now demonstrates likely invasion into the adjacent sigmoid colon. Prostate gland is surgically absent. Penile prosthesis. Small bilateral pleural effusions with atelectasis or consolidation in the lung  bases. Aortic atherosclerosis. Small left inguinal hernia containing fat. Electronically Signed   By: Lucienne Capers [name]   On: 02/23/2017 06:18   Ir Nephrostomy Placement Left  Result Date: 02/23/2017 INDICATION: 83 year old male with acute renal failure and bilateral obstructed pyonephrosis secondary to a primary bladder process, likely bladder cancer. He presents for urgent decompression with bilateral nephrostomy tubes. EXAM: IR NEPHROSTOMY PLACEMENT LEFT; IR NEPHROSTOMY PLACEMENT RIGHT COMPARISON:  CT abdomen/ pelvis 02/23/2017 MEDICATIONS: Patient has already received intravenous Rocephin and cefepime. No additional antibiotics were administered secondary to renal function. ANESTHESIA/SEDATION: Fentanyl 25 mcg IV; Versed 1.5 mg IV Moderate Sedation Time:  20 minutes The patient was continuously monitored during the procedure by the interventional radiology nurse under my direct supervision. CONTRAST:  Approximately 10 mL Isovue 300 - administered into the collecting system(s) FLUOROSCOPY  TIME:  Fluoroscopy Time: 2 minutes 30 seconds (27 mGy). COMPLICATIONS: None immediate. TECHNIQUE: The procedure, risks, benefits, and alternatives were explained to the patient. Questions regarding the procedure were encouraged and answered. The patient understands and consents to the procedure. The left flank was prepped with chlorhexidine in a sterile fashion, and a sterile drape was applied covering the operative field. A sterile gown and sterile gloves were used for the procedure. Local anesthesia was provided with 1% Lidocaine. The left flank was interrogated with ultrasound and the left kidney identified. The kidney is hydronephrotic. A suitable access site on the skin overlying the lower pole, posterior calix was identified. After local mg anesthesia was achieved, a small skin nick was made with an 11 blade scalpel. A 21 gauge Accustick needle was then advanced under direct sonographic guidance into the lower pole  of the left kidney. There was return of frankly purulent urine through the needle hub. A 0.018 inch wire was advanced under fluoroscopic guidance into the left renal collecting system. The Accustick sheath was then advanced over the wire and a 0.018 system exchanged for a 0.035 system. Gentle hand injection of contrast material confirms placement of the sheath within the renal collecting system. There is significant hydronephrosis and irregularity of the collecting system. The tract from the scan into the renal collecting system was then dilated serially to 10-French. A 10-French Cook all-purpose drain was then placed and positioned under fluoroscopic guidance. The locking loop is well formed within the left renal pelvis. The catheter was secured to the skin with 2-0 Prolene and a sterile bandage was placed. Catheter was left to gravity bag drainage. Attention was next turned to the right kidney. The right flank was prepped with chlorhexidine in a sterile fashion, and a sterile drape was applied covering the operative field. A sterile gown and sterile gloves were used for the procedure. Local anesthesia was provided with 1% Lidocaine. The right flank was interrogated with ultrasound and the left kidney identified. The kidney is hydronephrotic. A suitable access site on the skin overlying the lower pole, posterior calix was identified. After local mg anesthesia was achieved, a small skin nick was made with an 11 blade scalpel. A 21 gauge Accustick needle was then advanced under direct sonographic guidance into the lower pole of the right kidney. There was return of frankly purulent urine. A sample was aspirated and sent for culture. A 0.018 inch wire was advanced under fluoroscopic guidance into the left renal collecting system. The Accustick sheath was then advanced over the wire and a 0.018 system exchanged for a 0.035 system. Gentle hand injection of contrast material confirms placement of the sheath within the  renal collecting system. There is significant hydronephrosis and irregularity of the collecting system. The tract from the scan into the renal collecting system was then dilated serially to 10-French. A 10-French Cook all-purpose drain was then placed and positioned under fluoroscopic guidance. The locking loop is well formed within the left renal pelvis. The catheter was secured to the skin with 2-0 Prolene and a sterile bandage was placed. Catheter was left to gravity bag drainage. IMPRESSION: Successful placement of bilateral 10 French percutaneous nephrostomy tubes. Urine is frankly purulent bilaterally. A sample was obtained and sent for culture. Signed, Criselda Peaches, MD [facility] Rad[facility] Electronically Signed   By: Jacqulynn Cadet [name]   On: 02/23/2017 16:12   Ir Nephrostomy Placement Right  Result Date: 02/23/2017 INDICATION: 83 year old male with acute renal  failure and bilateral obstructed pyonephrosis secondary to a primary bladder process, likely bladder cancer. He presents for urgent decompression with bilateral nephrostomy tubes. EXAM: IR NEPHROSTOMY PLACEMENT LEFT; IR NEPHROSTOMY PLACEMENT RIGHT COMPARISON:  CT abdomen/ pelvis 02/23/2017 MEDICATIONS: Patient has already received intravenous Rocephin and cefepime. No additional antibiotics were administered secondary to renal function. ANESTHESIA/SEDATION: Fentanyl 25 mcg IV; Versed 1.5 mg IV Moderate Sedation Time:  20 minutes The patient was continuously monitored during the procedure by the interventional radiology nurse under my direct supervision. CONTRAST:  Approximately 10 mL Isovue 300 - administered into the collecting system(s) FLUOROSCOPY TIME:  Fluoroscopy Time: 2 minutes 30 seconds (27 mGy). COMPLICATIONS: None immediate. TECHNIQUE: The procedure, risks, benefits, and alternatives were explained to the patient. Questions regarding the procedure were encouraged and answered.  The patient understands and consents to the procedure. The left flank was prepped with chlorhexidine in a sterile fashion, and a sterile drape was applied covering the operative field. A sterile gown and sterile gloves were used for the procedure. Local anesthesia was provided with 1% Lidocaine. The left flank was interrogated with ultrasound and the left kidney identified. The kidney is hydronephrotic. A suitable access site on the skin overlying the lower pole, posterior calix was identified. After local mg anesthesia was achieved, a small skin nick was made with an 11 blade scalpel. A 21 gauge Accustick needle was then advanced under direct sonographic guidance into the lower pole of the left kidney. There was return of frankly purulent urine through the needle hub. A 0.018 inch wire was advanced under fluoroscopic guidance into the left renal collecting system. The Accustick sheath was then advanced over the wire and a 0.018 system exchanged for a 0.035 system. Gentle hand injection of contrast material confirms placement of the sheath within the renal collecting system. There is significant hydronephrosis and irregularity of the collecting system. The tract from the scan into the renal collecting system was then dilated serially to 10-French. A 10-French Cook all-purpose drain was then placed and positioned under fluoroscopic guidance. The locking loop is well formed within the left renal pelvis. The catheter was secured to the skin with 2-0 Prolene and a sterile bandage was placed. Catheter was left to gravity bag drainage. Attention was next turned to the right kidney. The right flank was prepped with chlorhexidine in a sterile fashion, and a sterile drape was applied covering the operative field. A sterile gown and sterile gloves were used for the procedure. Local anesthesia was provided with 1% Lidocaine. The right flank was interrogated with ultrasound and the left kidney identified. The kidney is  hydronephrotic. A suitable access site on the skin overlying the lower pole, posterior calix was identified. After local mg anesthesia was achieved, a small skin nick was made with an 11 blade scalpel. A 21 gauge Accustick needle was then advanced under direct sonographic guidance into the lower pole of the right kidney. There was return of frankly purulent urine. A sample was aspirated and sent for culture. A 0.018 inch wire was advanced under fluoroscopic guidance into the left renal collecting system. The Accustick sheath was then advanced over the wire and a 0.018 system exchanged for a 0.035 system. Gentle hand injection of contrast material confirms placement of the sheath within the renal collecting system. There is significant hydronephrosis and irregularity of the collecting system. The tract from the scan into the renal collecting system was then dilated serially to 10-French. A 10-French Cook all-purpose drain was then placed and positioned  under fluoroscopic guidance. The locking loop is well formed within the left renal pelvis. The catheter was secured to the skin with 2-0 Prolene and a sterile bandage was placed. Catheter was left to gravity bag drainage. IMPRESSION: Successful placement of bilateral 10 French percutaneous nephrostomy tubes. Urine is frankly purulent bilaterally. A sample was obtained and sent for culture. Signed, Criselda Peaches, MD [facility] Radiology Electronically Signed   By: Jacqulynn Cadet [name]   On: 02/23/2017 16:12     CBC  Recent Labs Lab 02/22/17 2134 02/23/17 0450 02/24/17 0657  WBC 15.0* 14.8* 11.2*  HGB 9.8* 8.4* 7.8*  HCT 28.4* 24.8* 23.4*  PLT 348 303 240  MCV 80.2 82.1 81.0  MCH 27.7 27.8 27.0  MCHC 34.5 33.9 33.3  RDW 15.3 15.8* 15.9*    Chemistries   Recent Labs Lab 02/22/17 2134 02/23/17 0055 02/23/17 0450 02/23/17 0921 02/23/17 1150  NA 132* 131* 135 135 136  K 6.8* 5.9* 5.9*  6.7* 7.0*  CL 104 105 105 104 105  CO2 11* 11* 11* 14* 12*  GLUCOSE 90 141* 104* 97 85  BUN 121* 107* 101* 116* 122*  CREATININE 17.69* 16.84* 17.05* 17.24* 16.65*  CALCIUM 9.3 8.6* 9.0 8.7* 8.6*  MG  --  2.4  --   --   --    ------------------------------------------------------------------------------------------------------------------ estimated creatinine clearance is 4.3 mL/min (A) (by C-G formula based on SCr of 16.65 mg/dL (H)). ------------------------------------------------------------------------------------------------------------------ No results for input(s): HGBA1C in the last 72 hours. ------------------------------------------------------------------------------------------------------------------ No results for input(s): CHOL, HDL, LDLCALC, TRIG, CHOLHDL, LDLDIRECT in the last 72 hours. ------------------------------------------------------------------------------------------------------------------ No results for input(s): TSH, T4TOTAL, T3FREE, THYROIDAB in the last 72 hours.  Invalid input(s): FREET3 ------------------------------------------------------------------------------------------------------------------ No results for input(s): VITAMINB12, FOLATE, FERRITIN, TIBC, IRON, RETICCTPCT in the last 72 hours.  Coagulation profile  Recent Labs Lab 02/23/17 1420  INR 1.55    No results for input(s): DDIMER in the last 72 hours.  Cardiac Enzymes No results for input(s): CKMB, TROPONINI, MYOGLOBIN in the last 168 hours.  Invalid input(s): CK ------------------------------------------------------------------------------------------------------------------ Invalid input(s): POCBNP   CBG:  Recent Labs Lab 02/23/17 0007 02/23/17 0132 02/23/17 0815  GLUCAP 116* 108* 107*       Studies: US Renal  Result Date: 02/23/2017 CLINICAL DATA:  Acute renal failure EXAM: RENAL / URINARY TRACT ULTRASOUND COMPLETE COMPARISON:  Renal ultrasound 09/02/2016 CT  abdomen pelvis 07/28/2016 FINDINGS: Right Kidney: Length: 13.2 cm. There is severe right hydroureteronephrosis with the proximal ureter measuring up to 2.5 cm and the distal ureter measuring up 1.9 cm. No calculus is identified. No solid renal mass. Left Kidney: The left kidney could not be visualized, as the patient was lying on his left side and could not be repositioned. Bladder: The urinary bladder is not distended. There is a masslike area of heterogeneous echotexture in the lower pelvis, measuring 9.1 x 7 x 8 cm. IMPRESSION: 1. Severe right hydroureteronephrosis. 2. Masslike area of heterogeneous echotexture at the expected location of the urinary bladder. Further evaluation with CT or MRI of the abdomen and pelvis is recommended. Electronically Signed   By: Ulyses Jarred [name]   On: 02/23/2017 01:57   Ct Renal Stone Study  Result Date: 02/23/2017 CLINICAL DATA:  Patient is dehydrated. No p.o. intake for 2 weeks. No urinary output. Penile infection and bleeding. History of stones. Bladder cancer. Recurrent prostate cancer. EXAM: CT ABDOMEN AND PELVIS WITHOUT CONTRAST TECHNIQUE: Multidetector CT imaging of the abdomen and pelvis was  performed following the standard protocol without IV contrast. COMPARISON:  07/28/2016 FINDINGS: Lower chest: Small bilateral pleural effusions with basilar atelectasis. Fibrosis in the lung bases. Postoperative changes in the mediastinum consistent with bypass grafts. Hepatobiliary: Examination is limited due to motion artifact. No focal liver lesions appreciated. Gallbladder and bile ducts are grossly unremarkable. Pancreas: Unremarkable. No pancreatic ductal dilatation or surrounding inflammatory changes. Spleen: Normal in size without focal abnormality. Adrenals/Urinary Tract: No adrenal gland nodules. There is prominent bilateral hydronephrosis and hydroureter. Hydronephrosis is progressing since prior study. There is a 9 mm stone in the distal right ureter. On the previous  study, this stone was in the kidney. There is a heterogeneous mass involving the bladder with infiltration and a surrounding tissues. The mass may be contributing to bilateral ureteral obstruction. Difficult to separate the bladder from the mass on noncontrast imaging. Altogether, the bladder/mass measures about 6.6 x 9 cm. There is progression since the previous study. Stomach/Bowel: Stomach and small bowel are decompressed. Stool-filled colon without abnormal distention. Diverticulosis of the sigmoid colon with probable muscular hypertrophy. Difficult the separated portion of the sigmoid colon from the bladder mass and involvement of the colon is suspected. Appendix is not identified. Vascular/Lymphatic: Aortic atherosclerosis. No enlarged abdominal or pelvic lymph nodes. Reproductive: Postoperative changes in the prostate gland. Penile prosthesis. Other: Small left inguinal hernia containing fat. No free air or free fluid in the abdomen. Musculoskeletal: Degenerative changes in the spine and hips. No destructive bone lesions. IMPRESSION: Prominent bilateral hydronephrosis and hydroureter. There is a 9 mm stone in the distal right ureter. The bladder wall is irregular with prominent soft tissue, suggesting an infiltrating mass. This may be causing obstruction of the left ureter. The mass is progressing since previous study and now demonstrates likely invasion into the adjacent sigmoid colon. Prostate gland is surgically absent. Penile prosthesis. Small bilateral pleural effusions with atelectasis or consolidation in the lung bases. Aortic atherosclerosis. Small left inguinal hernia containing fat. Electronically Signed   By: Lucienne Capers [name]   On: 02/23/2017 06:18   Ir Nephrostomy Placement Left  Result Date: 02/23/2017 INDICATION: 83 year old male with acute renal failure and bilateral obstructed pyonephrosis secondary to a primary bladder process, likely bladder cancer. He presents for urgent  decompression with bilateral nephrostomy tubes. EXAM: IR NEPHROSTOMY PLACEMENT LEFT; IR NEPHROSTOMY PLACEMENT RIGHT COMPARISON:  CT abdomen/ pelvis 02/23/2017 MEDICATIONS: Patient has already received intravenous Rocephin and cefepime. No additional antibiotics were administered secondary to renal function. ANESTHESIA/SEDATION: Fentanyl 25 mcg IV; Versed 1.5 mg IV Moderate Sedation Time:  20 minutes The patient was continuously monitored during the procedure by the interventional radiology nurse under my direct supervision. CONTRAST:  Approximately 10 mL Isovue 300 - administered into the collecting system(s) FLUOROSCOPY TIME:  Fluoroscopy Time: 2 minutes 30 seconds (27 mGy). COMPLICATIONS: None immediate. TECHNIQUE: The procedure, risks, benefits, and alternatives were explained to the patient. Questions regarding the procedure were encouraged and answered. The patient understands and consents to the procedure. The left flank was prepped with chlorhexidine in a sterile fashion, and a sterile drape was applied covering the operative field. A sterile gown and sterile gloves were used for the procedure. Local anesthesia was provided with 1% Lidocaine. The left flank was interrogated with ultrasound and the left kidney identified. The kidney is hydronephrotic. A suitable access site on the skin overlying the lower pole, posterior calix was identified. After local mg anesthesia was achieved, a small skin nick was made with an 11 blade scalpel. A 21  gauge Accustick needle was then advanced under direct sonographic guidance into the lower pole of the left kidney. There was return of frankly purulent urine through the needle hub. A 0.018 inch wire was advanced under fluoroscopic guidance into the left renal collecting system. The Accustick sheath was then advanced over the wire and a 0.018 system exchanged for a 0.035 system. Gentle hand injection of contrast material confirms placement of the sheath within the renal  collecting system. There is significant hydronephrosis and irregularity of the collecting system. The tract from the scan into the renal collecting system was then dilated serially to 10-French. A 10-French Cook all-purpose drain was then placed and positioned under fluoroscopic guidance. The locking loop is well formed within the left renal pelvis. The catheter was secured to the skin with 2-0 Prolene and a sterile bandage was placed. Catheter was left to gravity bag drainage. Attention was next turned to the right kidney. The right flank was prepped with chlorhexidine in a sterile fashion, and a sterile drape was applied covering the operative field. A sterile gown and sterile gloves were used for the procedure. Local anesthesia was provided with 1% Lidocaine. The right flank was interrogated with ultrasound and the left kidney identified. The kidney is hydronephrotic. A suitable access site on the skin overlying the lower pole, posterior calix was identified. After local mg anesthesia was achieved, a small skin nick was made with an 11 blade scalpel. A 21 gauge Accustick needle was then advanced under direct sonographic guidance into the lower pole of the right kidney. There was return of frankly purulent urine. A sample was aspirated and sent for culture. A 0.018 inch wire was advanced under fluoroscopic guidance into the left renal collecting system. The Accustick sheath was then advanced over the wire and a 0.018 system exchanged for a 0.035 system. Gentle hand injection of contrast material confirms placement of the sheath within the renal collecting system. There is significant hydronephrosis and irregularity of the collecting system. The tract from the scan into the renal collecting system was then dilated serially to 10-French. A 10-French Cook all-purpose drain was then placed and positioned under fluoroscopic guidance. The locking loop is well formed within the left renal pelvis. The catheter was secured  to the skin with 2-0 Prolene and a sterile bandage was placed. Catheter was left to gravity bag drainage. IMPRESSION: Successful placement of bilateral 10 French percutaneous nephrostomy tubes. Urine is frankly purulent bilaterally. A sample was obtained and sent for culture. Signed, Criselda Peaches, MD [facility] Radiology Electronically Signed   By: Jacqulynn Cadet [name]   On: 02/23/2017 16:12   Ir Nephrostomy Placement Right  Result Date: 02/23/2017 INDICATION: 83 year old male with acute renal failure and bilateral obstructed pyonephrosis secondary to a primary bladder process, likely bladder cancer. He presents for urgent decompression with bilateral nephrostomy tubes. EXAM: IR NEPHROSTOMY PLACEMENT LEFT; IR NEPHROSTOMY PLACEMENT RIGHT COMPARISON:  CT abdomen/ pelvis 02/23/2017 MEDICATIONS: Patient has already received intravenous Rocephin and cefepime. No additional antibiotics were administered secondary to renal function. ANESTHESIA/SEDATION: Fentanyl 25 mcg IV; Versed 1.5 mg IV Moderate Sedation Time:  20 minutes The patient was continuously monitored during the procedure by the interventional radiology nurse under my direct supervision. CONTRAST:  Approximately 10 mL Isovue 300 - administered into the collecting system(s) FLUOROSCOPY TIME:  Fluoroscopy Time: 2 minutes 30 seconds (27 mGy). COMPLICATIONS: None immediate. TECHNIQUE: The procedure, risks, benefits, and alternatives were explained to the patient. Questions regarding the  procedure were encouraged and answered. The patient understands and consents to the procedure. The left flank was prepped with chlorhexidine in a sterile fashion, and a sterile drape was applied covering the operative field. A sterile gown and sterile gloves were used for the procedure. Local anesthesia was provided with 1% Lidocaine. The left flank was interrogated with ultrasound and the left kidney identified. The  kidney is hydronephrotic. A suitable access site on the skin overlying the lower pole, posterior calix was identified. After local mg anesthesia was achieved, a small skin nick was made with an 11 blade scalpel. A 21 gauge Accustick needle was then advanced under direct sonographic guidance into the lower pole of the left kidney. There was return of frankly purulent urine through the needle hub. A 0.018 inch wire was advanced under fluoroscopic guidance into the left renal collecting system. The Accustick sheath was then advanced over the wire and a 0.018 system exchanged for a 0.035 system. Gentle hand injection of contrast material confirms placement of the sheath within the renal collecting system. There is significant hydronephrosis and irregularity of the collecting system. The tract from the scan into the renal collecting system was then dilated serially to 10-French. A 10-French Cook all-purpose drain was then placed and positioned under fluoroscopic guidance. The locking loop is well formed within the left renal pelvis. The catheter was secured to the skin with 2-0 Prolene and a sterile bandage was placed. Catheter was left to gravity bag drainage. Attention was next turned to the right kidney. The right flank was prepped with chlorhexidine in a sterile fashion, and a sterile drape was applied covering the operative field. A sterile gown and sterile gloves were used for the procedure. Local anesthesia was provided with 1% Lidocaine. The right flank was interrogated with ultrasound and the left kidney identified. The kidney is hydronephrotic. A suitable access site on the skin overlying the lower pole, posterior calix was identified. After local mg anesthesia was achieved, a small skin nick was made with an 11 blade scalpel. A 21 gauge Accustick needle was then advanced under direct sonographic guidance into the lower pole of the right kidney. There was return of frankly purulent urine. A sample was aspirated  and sent for culture. A 0.018 inch wire was advanced under fluoroscopic guidance into the left renal collecting system. The Accustick sheath was then advanced over the wire and a 0.018 system exchanged for a 0.035 system. Gentle hand injection of contrast material confirms placement of the sheath within the renal collecting system. There is significant hydronephrosis and irregularity of the collecting system. The tract from the scan into the renal collecting system was then dilated serially to 10-French. A 10-French Cook all-purpose drain was then placed and positioned under fluoroscopic guidance. The locking loop is well formed within the left renal pelvis. The catheter was secured to the skin with 2-0 Prolene and a sterile bandage was placed. Catheter was left to gravity bag drainage. IMPRESSION: Successful placement of bilateral 10 French percutaneous nephrostomy tubes. Urine is frankly purulent bilaterally. A sample was obtained and sent for culture. Signed, Criselda Peaches, MD [facility] Radiology Electronically Signed   By: Jacqulynn Cadet [name]   On: 02/23/2017 16:12      No results found for: HGBA1C Lab Results  Component Value Date   CREATININE 16.65 (H) 02/23/2017       Scheduled Meds: . ceFEPime (MAXIPIME) IV  500 mg Intravenous Q24H  . multivitamin with minerals  1 tablet Oral Daily   Continuous Infusions: . sodium chloride 75 mL/hr at 02/24/17 0506  .  sodium bicarbonate (isotonic) infusion in sterile water 125 mL/hr at 02/24/17 0506     LOS: 1 day    Time spent: >30 MINS    [facility]  [facility] Pager [phone]. If 7PM-7AM, please contact night-coverage at [email or web address], password [facility] 02/24/2017, 7:46 AM  LOS: 1 day

## 2017-02-24 NOTE — Progress Notes (Signed)
Daily Progress Note   Patient Name: [name]       Date: 02/24/2017 DOB: October 07, 1931  Age: 83 y.o. MRN#: [id number] Attending Physician: Reyne Dumas, MD Primary Care Physician: [name], MD Admit Date: 02/22/2017  Reason for Consultation/Follow-up: Establishing goals of care and Psychosocial/spiritual support  Subjective: Patient more alert today after getting nephrostomy tubes placed on 02/23/2017 but  his creatinine is still very elevated at 15.76,  potassium is  Improved, now 4.8. Patient appears surprised that he has a mass  (his wife also appeared surprised) regarding a mass. I believe both parties thought that they were dealing with prostate cancer and that the bladder cancer was in remission But after speaking with the patient's wife alone she does share that his urologist at [facility] shared that "he is dying" and that the cancer is still in his bladder. She is surprised about the words "mass" but recognizes that he is dying and that he himself did not want to come to the hospital this time but to stay home and die.   Length of Stay: 1  Current Medications: Scheduled Meds:  . ceFEPime (MAXIPIME) IV  500 mg Intravenous Q24H  . multivitamin with minerals  1 tablet Oral Daily    Continuous Infusions: .  sodium bicarbonate (isotonic) infusion in sterile water 125 mL/hr at 02/24/17 1000    PRN Meds: acetaminophen, fentaNYL (SUBLIMAZE) injection, traMADol  Physical Exam  Constitutional: He appears well-developed and well-nourished.  Eyes: EOM are normal. Pupils are equal, round, and reactive to light.  Cardiovascular: Normal rate.   Pulmonary/Chest: Effort normal.  Genitourinary:  Genitourinary Comments: Bilateral nephrostomy tubes  Neurological: He is alert.    Short-term memory deficits noted. Does not remember coming to the hospital  Skin: Skin is warm and dry.  Psychiatric:  Anxious; complaining of pain  Nursing note and vitals reviewed.           Vital Signs: BP 131/88 (BP Location: Right Arm)   Pulse 90   Temp 97.5 F (36.4 C) (Oral)   Resp 19   Ht 6\' 3"  (1.905 m)   Wt 102 kg (224 lb 13.9 oz)   SpO2 98%   BMI 28.11 kg/m  SpO2: SpO2: 98 % O2 Device: O2 Device: Not Delivered O2 Flow Rate: O2 Flow Rate (L/min): 2  L/min  Intake/output summary:  Intake/Output Summary (Last 24 hours) at 02/24/17 1028 Last data filed at 02/24/17 1000  Gross per 24 hour  Intake             5550 ml  Output             3925 ml  Net             1625 ml   LBM: Last BM Date: 02/24/17 Baseline Weight: Weight: 107 kg (235 lb 14.3 oz) Most recent weight: Weight: 102 kg (224 lb 13.9 oz)       Palliative Assessment/Data:    Flowsheet Rows     Most Recent Value  Intake Tab  Referral Department  Hospitalist  Unit at Time of Referral  Intermediate Care Unit  Palliative Care Primary Diagnosis  Cancer  Date Notified  02/23/17  Palliative Care Type  New Palliative care  Reason for referral  Clarify Goals of Care  Date of Admission  02/23/17  Date first seen by Palliative Care  02/23/17  # of days Palliative referral response time  0 Day(s)  # of days IP prior to Palliative referral  0  Clinical Assessment  Palliative Performance Scale Score  20%  Pain Max last 24 hours  Not able to report  Pain Min Last 24 hours  Not able to report  Dyspnea Max Last 24 Hours  Not able to report  Dyspnea Min Last 24 hours  Not able to report  Nausea Max Last 24 Hours  Not able to report  Nausea Min Last 24 Hours  Not able to report  Anxiety Max Last 24 Hours  Not able to report  Anxiety Min Last 24 Hours  Not able to report  Other Max Last 24 Hours  Not able to report  Psychosocial & Spiritual Assessment  Palliative Care Outcomes  Patient/Family meeting held?   Yes  Who was at the meeting?  wife  Palliative Care Outcomes  Clarified goals of care, Changed CPR status  Patient/Family wishes: Interventions discontinued/not started   Mechanical Ventilation  Palliative Care follow-up planned  Yes, Facility      Patient Active Problem List   Diagnosis Date Noted  . Hematochezia 02/23/2017  . Prostate CA ([place]) 02/23/2017  . Acidemia   . Palliative care encounter   . Hyperkalemia 12/08/2016  . Flu-like symptoms 12/08/2016  . Sepsis due to urinary tract infection ([place]) 10/01/2016  . Generalized weakness   . Acute lower UTI 09/01/2016  . Bladder cancer ([place]) 09/01/2016  . Neuropathic pain   . Contact dermatitis   . AKI (acute kidney injury) ([place])   . Recurrent UTI   . Leukocytosis   . Absence of bladder continence   . Pain   . Hydrocele in adult   . Acute cystitis with hematuria   . Acute blood loss anemia   . Normocytic anemia   . Other secondary hypertension   . Acute idiopathic gout of left knee   . Urinary retention   . Debilitated 08/02/2016  . Bilateral hydronephrosis   . Normochromic normocytic anemia   . E-coli UTI   . Benign essential HTN   . Coronary artery disease involving coronary bypass graft of native heart without angina pectoris   . BPH (benign prostatic hyperplasia)   . Kidney stones   . CKD (chronic kidney disease), stage II   . Morbid obesity due to excess calories ([place])   . Acute renal failure (ARF) ([place]) 07/29/2016  Palliative Care Assessment & Plan   Patient Profile: 83 y.o. male  with past medical history of bladder cancer, coronary artery disease with CABG,prostate surgery with penile prothesis  To prostate  cancer, hyperlipidemia, hypertension, admitted on 02/22/2017 after rapid decline over the past 1-2 weeks as evidenced by no oral intake, no urine output, and diarrhea. They also noted bright red blood in his diaper for the past 2 days. He is also been verbalizing pain in his right lower abdomen and groin.  Upon arrival to the emergency room he was afebrile tachycardic with stable blood pressure.EKG showed right bundle branch block. Initial potassium was 6.8 on admission and serum creatinine was 17.Patient was found to have bilateral hydro-nephrosis. Nephrology as well as urology consults placed. Bilateral nephrostomy tubes placed 02/23/2017   Assessment: Patient is more alert. He is confused, anxious but able to answer simple questions relevantly. Creatinine today is 15.76. Potassium 4.8. His hemoglobin has dropped to 7.8. His baseline hemoglobin is 9.5.  Recommendations/Plan:  Continue to treat the treatable with IV fluids, IV antibiotics; when medically maximized transfer to inpatient hospice for end-of-life care.  Patient's wife reiterated DO NOT RESUSCITATE, DO NOT INTUBATE and no hemodialysis  Palliative medicine team to stay involved in terms of both symptom management as well as progression of symptoms and disposition to inpatient hospice  Goals of Care and Additional Recommendations:  Limitations on Scope of Treatment: Full Scope Treatment  Code Status:    Code Status Orders        Start     Ordered   02/23/17 1452  Do not attempt resuscitation (DNR)  Continuous    Question Answer Comment  In the event of cardiac or respiratory ARREST Do not call a "code blue"   In the event of cardiac or respiratory ARREST Do not perform Intubation, CPR, defibrillation or ACLS   In the event of cardiac or respiratory ARREST Use medication by any route, position, wound care, and other measures to relive pain and suffering. May use oxygen, suction and manual treatment of airway obstruction as needed for comfort.      02/23/17 1451    Code Status History    Date Active Date Inactive Code Status Order ID Comments User Context   02/23/2017  4:29 AM 02/23/2017  2:50 PM Full Code [id number]  Vianne Bulls, MD ED   12/08/2016 11:08 PM 12/11/2016  3:48 PM Full Code [id number]  Vianne Bulls, MD ED    10/01/2016  7:43 PM 10/03/2016  9:42 PM Full Code [id number]  Vianne Bulls, MD ED   09/01/2016 11:26 PM 09/04/2016  3:09 PM Full Code [id number]  Reubin Milan, MD Inpatient   08/02/2016  9:32 PM 08/02/2016  9:32 PM Full Code [id number]  Cathlyn Parsons, PA-C Inpatient   08/02/2016  9:32 PM 08/16/2016  2:36 PM Full Code [id number]  Lavon Paganini Angiulli, PA-C Inpatient   07/29/2016  2:42 AM 08/02/2016  9:16 PM Full Code [id number]  Dixie Dials, MD ED       Prognosis:  Less than 4 weeks in the setting of  metastatic prostate cancer, 6.6 x 9 cm mass invading sigmoid colon and involving the bladder, bilateral hydronephrosis, urinary tract infection, now with  acute renal failure creatinine 15.76  Discharge Planning:  Hospice facility When medically maximized Care plan was discussed with Dr. Allyson Sabal  Thank you for allowing the Palliative Medicine Team to assist in the care of this patient.   Time In: 1100 Time  Out: 1200 Total Time 60 min Prolonged Time Billed  yes       Greater than 50%  of this time was spent counseling and coordinating care related to the above assessment and plan.  Dory Horn, NP  Please contact Palliative Medicine Team phone at [phone] for questions and concerns.

## 2017-02-25 DIAGNOSIS — Z7189 Other specified counseling: Secondary | ICD-10-CM

## 2017-02-25 LAB — URINE CULTURE: Culture: 60000 — AB

## 2017-02-25 LAB — CBC
HEMATOCRIT: 23.6 % — AB (ref 39.0–52.0)
Hemoglobin: 7.8 g/dL — ABNORMAL LOW (ref 13.0–17.0)
MCH: 27.1 pg (ref 26.0–34.0)
MCHC: 33.1 g/dL (ref 30.0–36.0)
MCV: 81.9 fL (ref 78.0–100.0)
Platelets: 211 10*3/uL (ref 150–400)
RBC: 2.88 MIL/uL — ABNORMAL LOW (ref 4.22–5.81)
RDW: 16 % — ABNORMAL HIGH (ref 11.5–15.5)
WBC: 10 10*3/uL (ref 4.0–10.5)

## 2017-02-25 LAB — COMPREHENSIVE METABOLIC PANEL
ALT: 10 U/L — ABNORMAL LOW (ref 17–63)
ANION GAP: 18 — AB (ref 5–15)
AST: 16 U/L (ref 15–41)
Albumin: 1.7 g/dL — ABNORMAL LOW (ref 3.5–5.0)
Alkaline Phosphatase: 48 U/L (ref 38–126)
BUN: 94 mg/dL — ABNORMAL HIGH (ref 6–20)
CALCIUM: 8 mg/dL — AB (ref 8.9–10.3)
CO2: 27 mmol/L (ref 22–32)
Chloride: 99 mmol/L — ABNORMAL LOW (ref 101–111)
Creatinine, Ser: 13.87 mg/dL — ABNORMAL HIGH (ref 0.61–1.24)
GFR calc non Af Amer: 3 mL/min — ABNORMAL LOW (ref >60)
GFR, EST AFRICAN AMERICAN: 3 mL/min — AB (ref >60)
Glucose, Bld: 105 mg/dL — ABNORMAL HIGH (ref 65–99)
Potassium: 3.9 mmol/L (ref 3.5–5.1)
Sodium: 144 mmol/L (ref 135–145)
TOTAL PROTEIN: 5.8 g/dL — AB (ref 6.5–8.1)
Total Bilirubin: 0.4 mg/dL (ref 0.3–1.2)

## 2017-02-25 LAB — CBG MONITORING, ED: Glucose-Capillary: 120 mg/dL — ABNORMAL HIGH (ref 65–99)

## 2017-02-25 LAB — GLUCOSE, CAPILLARY: GLUCOSE-CAPILLARY: 98 mg/dL (ref 65–99)

## 2017-02-25 MED ORDER — ORAL CARE MOUTH RINSE
15.0000 mL | Freq: Two times a day (BID) | OROMUCOSAL | Status: DC
  Administered 2017-02-25 – 2017-02-26 (×3): 15 mL via OROMUCOSAL

## 2017-02-25 MED ORDER — CIPROFLOXACIN HCL 250 MG PO TABS
250.0000 mg | ORAL_TABLET | Freq: Every day | ORAL | Status: DC
  Administered 2017-02-26: 250 mg via ORAL
  Filled 2017-02-25 (×2): qty 1

## 2017-02-25 MED ORDER — LORAZEPAM 2 MG/ML IJ SOLN
1.0000 mg | Freq: Three times a day (TID) | INTRAMUSCULAR | Status: DC | PRN
  Administered 2017-02-25: 1 mg via INTRAVENOUS
  Filled 2017-02-25: qty 1

## 2017-02-25 NOTE — Plan of Care (Signed)
Problem: Health Behavior/Discharge Planning: Goal: Ability to manage health-related needs will improve Outcome: Progressing Patient will likely be discharged to hospice.   Problem: Activity: Goal: Risk for activity intolerance will decrease Outcome: Progressing PT/OT eval ordered.

## 2017-02-25 NOTE — Care Management Note (Signed)
Case Management Note  Patient Details  Name: [name] MRN: [id number] Date of Birth: 01-12-1931  Subjective/Objective:   Presents with hx of bladder and prostate cancer, presents with rapid decline over past weeks with no po intake,no urine output and diarrhea, blood in diaper, pain in right lower abd and groin, tachycardic.  He has a mass in his bladder, he is for residential hospice, CSW following.               Action/Plan:   Expected Discharge Date:                  Expected Discharge Plan:  [place]  In-House Referral:  Clinical Social Work  Discharge planning Services  CM Consult  Post Acute Care Choice:    Choice offered to:     DME Arranged:    DME Agency:     HH Arranged:    [place] Agency:     Status of Service:  Completed, signed off  If discussed at [employer], dates discussed:    Additional Comments:  Zenon Mayo, RN 02/25/2017, 3:05 PM

## 2017-02-25 NOTE — Progress Notes (Signed)
Patient ID: [name], male   DOB: Apr 09, 1931, 83 y.o.   MRN: [id number] S:  Over 5 liters of UOP- BUN and creatinine down but still abnormal.  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 and causing urinary obstruction   O:BP 120/89 (BP Location: Right Arm)   Pulse 87   Temp 98.4 F (36.9 C) (Oral)   Resp 19   Ht 6\' 3"  (1.905 m)   Wt 101.2 kg (223 lb 1.6 oz)   SpO2 99%   BMI 27.89 kg/m   Intake/Output Summary (Last 24 hours) at 02/25/17 1056 Last data filed at 02/25/17 1039  Gross per 24 hour  Intake             5085 ml  Output             5925 ml  Net             -840 ml   Intake/Output: I/O last 3 completed shifts: In: 7345 [P.O.:660; I.V.:6635; Other:50] Out: 8588 [Urine:8750]  Intake/Output this shift:  Total I/O In: 1390 [P.O.:480; I.V.:900; Other:10] Out: 1100 [Urine:1100] Weight change: 2.798 kg (6 lb 2.7 oz) Gen:NAD CVS:no rub Resp: cta Abd: +BS, mildly tnder Ext: no edema   Recent Labs Lab 02/22/17 2134 02/23/17 0055 02/23/17 0450 02/23/17 0921 02/23/17 1150 02/24/17 0657 02/25/17 0320  NA 132* 131* 135 135 136 139  139 144  K 6.8* 5.9* 5.9* 6.7* 7.0* 4.9  4.8 3.9  CL 104 105 105 104 105 100*  103 99*  CO2 11* 11* 11* 14* 12* 20*  19* 27  GLUCOSE 90 141* 104* 97 85 86  88 105*  BUN 121* 107* 101* 116* 122* 111*  110* 94*  CREATININE 17.69* 16.84* 17.05* 17.24* 16.65* 16.10*  15.76* 13.87*  ALBUMIN  --  2.1* 2.3*  --  1.8* 1.7*  1.7* 1.7*  CALCIUM 9.3 8.6* 9.0 8.7* 8.6* 8.3*  8.2* 8.0*  PHOS  --  8.2* 7.3*  --  7.6* 8.0*  --   AST  --   --   --   --   --  14* 16  ALT  --   --   --   --   --  9* 10*   Liver Function Tests:  Recent Labs Lab 02/23/17 1150 02/24/17 0657 02/25/17 0320  AST  --  14* 16  ALT  --  9* 10*  ALKPHOS  --  43 48  BILITOT  --  0.6 0.4  PROT  --  5.8* 5.8*  ALBUMIN 1.8* 1.7*  1.7* 1.7*   No results for input(s): LIPASE, AMYLASE in the last 168  hours. No results for input(s): AMMONIA in the last 168 hours. CBC:  Recent Labs Lab 02/22/17 2134 02/23/17 0450 02/24/17 0657 02/25/17 0320  WBC 15.0* 14.8* 11.2* 10.0  HGB 9.8* 8.4* 7.8* 7.8*  HCT 28.4* 24.8* 23.4* 23.6*  MCV 80.2 82.1 81.0 81.9  PLT 348 303 240 211   Cardiac Enzymes:  Recent Labs Lab 02/23/17 0055  CKTOTAL 47*   CBG:  Recent Labs Lab 02/23/17 0132 02/23/17 0214 02/23/17 0815 02/24/17 0854 02/25/17 0730  GLUCAP 108* 120* 107* 84 98    Iron Studies: No results for input(s): IRON, TIBC, TRANSFERRIN, FERRITIN in the last 72 hours. Studies/Results: Ir Nephrostomy Placement Left  Result Date: 02/23/2017 INDICATION: 83 year old male with acute renal failure and bilateral obstructed pyonephrosis secondary to a primary bladder process, likely bladder  cancer. He presents for urgent decompression with bilateral nephrostomy tubes. EXAM: IR NEPHROSTOMY PLACEMENT LEFT; IR NEPHROSTOMY PLACEMENT RIGHT COMPARISON:  CT abdomen/ pelvis 02/23/2017 MEDICATIONS: Patient has already received intravenous Rocephin and cefepime. No additional antibiotics were administered secondary to renal function. ANESTHESIA/SEDATION: Fentanyl 25 mcg IV; Versed 1.5 mg IV Moderate Sedation Time:  20 minutes The patient was continuously monitored during the procedure by the interventional radiology nurse under my direct supervision. CONTRAST:  Approximately 10 mL Isovue 300 - administered into the collecting system(s) FLUOROSCOPY TIME:  Fluoroscopy Time: 2 minutes 30 seconds (27 mGy). COMPLICATIONS: None immediate. TECHNIQUE: The procedure, risks, benefits, and alternatives were explained to the patient. Questions regarding the procedure were encouraged and answered. The patient understands and consents to the procedure. The left flank was prepped with chlorhexidine in a sterile fashion, and a sterile drape was applied covering the operative field. A sterile gown and sterile gloves were used for the  procedure. Local anesthesia was provided with 1% Lidocaine. The left flank was interrogated with ultrasound and the left kidney identified. The kidney is hydronephrotic. A suitable access site on the skin overlying the lower pole, posterior calix was identified. After local mg anesthesia was achieved, a small skin nick was made with an 11 blade scalpel. A 21 gauge Accustick needle was then advanced under direct sonographic guidance into the lower pole of the left kidney. There was return of frankly purulent urine through the needle hub. A 0.018 inch wire was advanced under fluoroscopic guidance into the left renal collecting system. The Accustick sheath was then advanced over the wire and a 0.018 system exchanged for a 0.035 system. Gentle hand injection of contrast material confirms placement of the sheath within the renal collecting system. There is significant hydronephrosis and irregularity of the collecting system. The tract from the scan into the renal collecting system was then dilated serially to 10-French. A 10-French Cook all-purpose drain was then placed and positioned under fluoroscopic guidance. The locking loop is well formed within the left renal pelvis. The catheter was secured to the skin with 2-0 Prolene and a sterile bandage was placed. Catheter was left to gravity bag drainage. Attention was next turned to the right kidney. The right flank was prepped with chlorhexidine in a sterile fashion, and a sterile drape was applied covering the operative field. A sterile gown and sterile gloves were used for the procedure. Local anesthesia was provided with 1% Lidocaine. The right flank was interrogated with ultrasound and the left kidney identified. The kidney is hydronephrotic. A suitable access site on the skin overlying the lower pole, posterior calix was identified. After local mg anesthesia was achieved, a small skin nick was made with an 11 blade scalpel. A 21 gauge Accustick needle was then  advanced under direct sonographic guidance into the lower pole of the right kidney. There was return of frankly purulent urine. A sample was aspirated and sent for culture. A 0.018 inch wire was advanced under fluoroscopic guidance into the left renal collecting system. The Accustick sheath was then advanced over the wire and a 0.018 system exchanged for a 0.035 system. Gentle hand injection of contrast material confirms placement of the sheath within the renal collecting system. There is significant hydronephrosis and irregularity of the collecting system. The tract from the scan into the renal collecting system was then dilated serially to 10-French. A 10-French Cook all-purpose drain was then placed and positioned under fluoroscopic guidance. The locking loop is well formed within the left renal  pelvis. The catheter was secured to the skin with 2-0 Prolene and a sterile bandage was placed. Catheter was left to gravity bag drainage. IMPRESSION: Successful placement of bilateral 10 French percutaneous nephrostomy tubes. Urine is frankly purulent bilaterally. A sample was obtained and sent for culture. Signed, Criselda Peaches, MD [facility] Radiology Electronically Signed   By: Jacqulynn Cadet [name]   On: 02/23/2017 16:12   Ir Nephrostomy Placement Right  Result Date: 02/23/2017 INDICATION: 83 year old male with acute renal failure and bilateral obstructed pyonephrosis secondary to a primary bladder process, likely bladder cancer. He presents for urgent decompression with bilateral nephrostomy tubes. EXAM: IR NEPHROSTOMY PLACEMENT LEFT; IR NEPHROSTOMY PLACEMENT RIGHT COMPARISON:  CT abdomen/ pelvis 02/23/2017 MEDICATIONS: Patient has already received intravenous Rocephin and cefepime. No additional antibiotics were administered secondary to renal function. ANESTHESIA/SEDATION: Fentanyl 25 mcg IV; Versed 1.5 mg IV Moderate Sedation Time:  20 minutes The  patient was continuously monitored during the procedure by the interventional radiology nurse under my direct supervision. CONTRAST:  Approximately 10 mL Isovue 300 - administered into the collecting system(s) FLUOROSCOPY TIME:  Fluoroscopy Time: 2 minutes 30 seconds (27 mGy). COMPLICATIONS: None immediate. TECHNIQUE: The procedure, risks, benefits, and alternatives were explained to the patient. Questions regarding the procedure were encouraged and answered. The patient understands and consents to the procedure. The left flank was prepped with chlorhexidine in a sterile fashion, and a sterile drape was applied covering the operative field. A sterile gown and sterile gloves were used for the procedure. Local anesthesia was provided with 1% Lidocaine. The left flank was interrogated with ultrasound and the left kidney identified. The kidney is hydronephrotic. A suitable access site on the skin overlying the lower pole, posterior calix was identified. After local mg anesthesia was achieved, a small skin nick was made with an 11 blade scalpel. A 21 gauge Accustick needle was then advanced under direct sonographic guidance into the lower pole of the left kidney. There was return of frankly purulent urine through the needle hub. A 0.018 inch wire was advanced under fluoroscopic guidance into the left renal collecting system. The Accustick sheath was then advanced over the wire and a 0.018 system exchanged for a 0.035 system. Gentle hand injection of contrast material confirms placement of the sheath within the renal collecting system. There is significant hydronephrosis and irregularity of the collecting system. The tract from the scan into the renal collecting system was then dilated serially to 10-French. A 10-French Cook all-purpose drain was then placed and positioned under fluoroscopic guidance. The locking loop is well formed within the left renal pelvis. The catheter was secured to the skin with 2-0 Prolene and a  sterile bandage was placed. Catheter was left to gravity bag drainage. Attention was next turned to the right kidney. The right flank was prepped with chlorhexidine in a sterile fashion, and a sterile drape was applied covering the operative field. A sterile gown and sterile gloves were used for the procedure. Local anesthesia was provided with 1% Lidocaine. The right flank was interrogated with ultrasound and the left kidney identified. The kidney is hydronephrotic. A suitable access site on the skin overlying the lower pole, posterior calix was identified. After local mg anesthesia was achieved, a small skin nick was made with an 11 blade scalpel. A 21 gauge Accustick needle was then advanced under direct sonographic guidance into the lower pole of the right kidney. There was return of frankly purulent urine. A sample was aspirated  and sent for culture. A 0.018 inch wire was advanced under fluoroscopic guidance into the left renal collecting system. The Accustick sheath was then advanced over the wire and a 0.018 system exchanged for a 0.035 system. Gentle hand injection of contrast material confirms placement of the sheath within the renal collecting system. There is significant hydronephrosis and irregularity of the collecting system. The tract from the scan into the renal collecting system was then dilated serially to 10-French. A 10-French Cook all-purpose drain was then placed and positioned under fluoroscopic guidance. The locking loop is well formed within the left renal pelvis. The catheter was secured to the skin with 2-0 Prolene and a sterile bandage was placed. Catheter was left to gravity bag drainage. IMPRESSION: Successful placement of bilateral 10 French percutaneous nephrostomy tubes. Urine is frankly purulent bilaterally. A sample was obtained and sent for culture. Signed, Criselda Peaches, MD [facility] Radiology Electronically Signed   By:  Jacqulynn Cadet [name]   On: 02/23/2017 16:12   . ceFEPime (MAXIPIME) IV  500 mg Intravenous Q24H  . mouth rinse  15 mL Mouth Rinse BID  . multivitamin with minerals  1 tablet Oral Daily    BMET    Component Value Date/Time   NA 144 02/25/2017 0320   K 3.9 02/25/2017 0320   CL 99 (L) 02/25/2017 0320   CO2 27 02/25/2017 0320   GLUCOSE 105 (H) 02/25/2017 0320   BUN 94 (H) 02/25/2017 0320   CREATININE 13.87 (H) 02/25/2017 0320   CALCIUM 8.0 (L) 02/25/2017 0320   GFRNONAA 3 (L) 02/25/2017 0320   GFRAA 3 (L) 02/25/2017 0320   CBC    Component Value Date/Time   WBC 10.0 02/25/2017 0320   RBC 2.88 (L) 02/25/2017 0320   HGB 7.8 (L) 02/25/2017 0320   HCT 23.6 (L) 02/25/2017 0320   PLT 211 02/25/2017 0320   MCV 81.9 02/25/2017 0320   MCH 27.1 02/25/2017 0320   MCHC 33.1 02/25/2017 0320   RDW 16.0 (H) 02/25/2017 0320   LYMPHSABS 2.4 12/08/2016 1754   MONOABS 1.4 (H) 12/08/2016 1754   EOSABS 0.3 12/08/2016 1754   BASOSABS 0.0 12/08/2016 1754     Assessment/Plan: 1. AKI/CKD- in setting of volume depletion and bladder outlet obstruction due to recurrent bladder cancer.  Renal US with severe right hydroureteronephrosis with mass-like lesion in urinary bladder.    Appreciate Urology's and IR's input and assistance in placing bilateral percutaneous nephrostomy tubes 02/23/17.   1. Continue with IVF's.  2. bilateral perc nephrostomy tube placement 02/23/17 with marked increase in urine output 2. FTT- has not been doing well for several months, likely related to his malignancies and advanced age. 3. Disposition- appreciate Palliative care consult  He is now DNR and not long term dialysis candidate.  Continue with supportive measures. Seems that will be transitioned to comfort care and be discharged to a hospice facility.  Renal will sign off, call with any questions   [facility]  [employer] [phone]

## 2017-02-25 NOTE — Progress Notes (Signed)
Patient ID: [name], male   DOB: 10-28-1931, 83 y.o.   MRN: [id number]    Referring Physician(s): Dr. Nicolette Bang  Supervising Physician: Aletta Edouard  Patient Status: [facility] - In-pt  Chief Complaint: Bilateral hydronephrosis   Subjective: Patient has no complaints today.  RN states right drain is putting out more than left.  Allergies: Gabapentin; Aspirin; and Other  Medications: Prior to Admission medications   Medication Sig Start Date End Date Taking? Authorizing Provider  Multiple Vitamin (MULTIVITAMIN WITH MINERALS) TABS tablet Take 1 tablet by mouth daily.   Yes Historical Provider, MD  rosuvastatin (CRESTOR) 10 MG tablet Take 10 mg by mouth daily. pw 08/11/16  Yes Historical Provider, MD  traMADol (ULTRAM) 50 MG tablet Take 1 tablet (50 mg total) by mouth every 6 (six) hours as needed for moderate pain. 08/16/16  Yes Daniel J Angiulli, PA-C  aspirin EC 81 MG EC tablet Take 1 tablet (81 mg total) by mouth daily. Patient not taking: Reported on 02/22/2017 10/04/16   Clanford Marisa Hua, MD    Vital Signs: BP 126/82 (BP Location: Right Arm)   Pulse 75   Temp 97.7 F (36.5 C) (Oral)   Resp 16   Ht 6\' 3"  (1.905 m)   Wt 223 lb 1.6 oz (101.2 kg)   SpO2 98%   BMI 27.89 kg/m   Physical Exam: abd: each flank with a PCN in place and site is c/d/I.  Right PCN with clear output.  5350cc yesterday.  Left PCN with a small amount of blood tinge to it.  200cc out yesterday.  Imaging: US Renal  Result Date: 02/23/2017 CLINICAL DATA:  Acute renal failure EXAM: RENAL / URINARY TRACT ULTRASOUND COMPLETE COMPARISON:  Renal ultrasound 09/02/2016 CT abdomen pelvis 07/28/2016 FINDINGS: Right Kidney: Length: 13.2 cm. There is severe right hydroureteronephrosis with the proximal ureter measuring up to 2.5 cm and the distal ureter measuring up 1.9 cm. No calculus is identified. No solid renal mass. Left Kidney: The left kidney could not be visualized, as the patient was lying on his  left side and could not be repositioned. Bladder: The urinary bladder is not distended. There is a masslike area of heterogeneous echotexture in the lower pelvis, measuring 9.1 x 7 x 8 cm. IMPRESSION: 1. Severe right hydroureteronephrosis. 2. Masslike area of heterogeneous echotexture at the expected location of the urinary bladder. Further evaluation with CT or MRI of the abdomen and pelvis is recommended. Electronically Signed   By: Ulyses Jarred [name]   On: 02/23/2017 01:57   Ct Renal Stone Study  Result Date: 02/23/2017 CLINICAL DATA:  Patient is dehydrated. No p.o. intake for 2 weeks. No urinary output. Penile infection and bleeding. History of stones. Bladder cancer. Recurrent prostate cancer. EXAM: CT ABDOMEN AND PELVIS WITHOUT CONTRAST TECHNIQUE: Multidetector CT imaging of the abdomen and pelvis was performed following the standard protocol without IV contrast. COMPARISON:  07/28/2016 FINDINGS: Lower chest: Small bilateral pleural effusions with basilar atelectasis. Fibrosis in the lung bases. Postoperative changes in the mediastinum consistent with bypass grafts. Hepatobiliary: Examination is limited due to motion artifact. No focal liver lesions appreciated. Gallbladder and bile ducts are grossly unremarkable. Pancreas: Unremarkable. No pancreatic ductal dilatation or surrounding inflammatory changes. Spleen: Normal in size without focal abnormality. Adrenals/Urinary Tract: No adrenal gland nodules. There is prominent bilateral hydronephrosis and hydroureter. Hydronephrosis is progressing since prior study. There is a 9 mm stone in the distal right ureter. On the previous study, this stone was in the  kidney. There is a heterogeneous mass involving the bladder with infiltration and a surrounding tissues. The mass may be contributing to bilateral ureteral obstruction. Difficult to separate the bladder from the mass on noncontrast imaging. Altogether, the bladder/mass measures about 6.6 x 9 cm. There is  progression since the previous study. Stomach/Bowel: Stomach and small bowel are decompressed. Stool-filled colon without abnormal distention. Diverticulosis of the sigmoid colon with probable muscular hypertrophy. Difficult the separated portion of the sigmoid colon from the bladder mass and involvement of the colon is suspected. Appendix is not identified. Vascular/Lymphatic: Aortic atherosclerosis. No enlarged abdominal or pelvic lymph nodes. Reproductive: Postoperative changes in the prostate gland. Penile prosthesis. Other: Small left inguinal hernia containing fat. No free air or free fluid in the abdomen. Musculoskeletal: Degenerative changes in the spine and hips. No destructive bone lesions. IMPRESSION: Prominent bilateral hydronephrosis and hydroureter. There is a 9 mm stone in the distal right ureter. The bladder wall is irregular with prominent soft tissue, suggesting an infiltrating mass. This may be causing obstruction of the left ureter. The mass is progressing since previous study and now demonstrates likely invasion into the adjacent sigmoid colon. Prostate gland is surgically absent. Penile prosthesis. Small bilateral pleural effusions with atelectasis or consolidation in the lung bases. Aortic atherosclerosis. Small left inguinal hernia containing fat. Electronically Signed   By: Lucienne Capers [name]   On: 02/23/2017 06:18   Ir Nephrostomy Placement Left  Result Date: 02/23/2017 INDICATION: 83 year old male with acute renal failure and bilateral obstructed pyonephrosis secondary to a primary bladder process, likely bladder cancer. He presents for urgent decompression with bilateral nephrostomy tubes. EXAM: IR NEPHROSTOMY PLACEMENT LEFT; IR NEPHROSTOMY PLACEMENT RIGHT COMPARISON:  CT abdomen/ pelvis 02/23/2017 MEDICATIONS: Patient has already received intravenous Rocephin and cefepime. No additional antibiotics were administered secondary to renal function. ANESTHESIA/SEDATION: Fentanyl 25 mcg  IV; Versed 1.5 mg IV Moderate Sedation Time:  20 minutes The patient was continuously monitored during the procedure by the interventional radiology nurse under my direct supervision. CONTRAST:  Approximately 10 mL Isovue 300 - administered into the collecting system(s) FLUOROSCOPY TIME:  Fluoroscopy Time: 2 minutes 30 seconds (27 mGy). COMPLICATIONS: None immediate. TECHNIQUE: The procedure, risks, benefits, and alternatives were explained to the patient. Questions regarding the procedure were encouraged and answered. The patient understands and consents to the procedure. The left flank was prepped with chlorhexidine in a sterile fashion, and a sterile drape was applied covering the operative field. A sterile gown and sterile gloves were used for the procedure. Local anesthesia was provided with 1% Lidocaine. The left flank was interrogated with ultrasound and the left kidney identified. The kidney is hydronephrotic. A suitable access site on the skin overlying the lower pole, posterior calix was identified. After local mg anesthesia was achieved, a small skin nick was made with an 11 blade scalpel. A 21 gauge Accustick needle was then advanced under direct sonographic guidance into the lower pole of the left kidney. There was return of frankly purulent urine through the needle hub. A 0.018 inch wire was advanced under fluoroscopic guidance into the left renal collecting system. The Accustick sheath was then advanced over the wire and a 0.018 system exchanged for a 0.035 system. Gentle hand injection of contrast material confirms placement of the sheath within the renal collecting system. There is significant hydronephrosis and irregularity of the collecting system. The tract from the scan into the renal collecting system was then dilated serially to 10-French. A 10-French Cook all-purpose drain was then  placed and positioned under fluoroscopic guidance. The locking loop is well formed within the left renal pelvis.  The catheter was secured to the skin with 2-0 Prolene and a sterile bandage was placed. Catheter was left to gravity bag drainage. Attention was next turned to the right kidney. The right flank was prepped with chlorhexidine in a sterile fashion, and a sterile drape was applied covering the operative field. A sterile gown and sterile gloves were used for the procedure. Local anesthesia was provided with 1% Lidocaine. The right flank was interrogated with ultrasound and the left kidney identified. The kidney is hydronephrotic. A suitable access site on the skin overlying the lower pole, posterior calix was identified. After local mg anesthesia was achieved, a small skin nick was made with an 11 blade scalpel. A 21 gauge Accustick needle was then advanced under direct sonographic guidance into the lower pole of the right kidney. There was return of frankly purulent urine. A sample was aspirated and sent for culture. A 0.018 inch wire was advanced under fluoroscopic guidance into the left renal collecting system. The Accustick sheath was then advanced over the wire and a 0.018 system exchanged for a 0.035 system. Gentle hand injection of contrast material confirms placement of the sheath within the renal collecting system. There is significant hydronephrosis and irregularity of the collecting system. The tract from the scan into the renal collecting system was then dilated serially to 10-French. A 10-French Cook all-purpose drain was then placed and positioned under fluoroscopic guidance. The locking loop is well formed within the left renal pelvis. The catheter was secured to the skin with 2-0 Prolene and a sterile bandage was placed. Catheter was left to gravity bag drainage. IMPRESSION: Successful placement of bilateral 10 French percutaneous nephrostomy tubes. Urine is frankly purulent bilaterally. A sample was obtained and sent for culture. Signed, Criselda Peaches, MD [facility] Electronically Signed   By: Jacqulynn Cadet [name]   On: 02/23/2017 16:12   Ir Nephrostomy Placement Right  Result Date: 02/23/2017 INDICATION: 83 year old male with acute renal failure and bilateral obstructed pyonephrosis secondary to a primary bladder process, likely bladder cancer. He presents for urgent decompression with bilateral nephrostomy tubes. EXAM: IR NEPHROSTOMY PLACEMENT LEFT; IR NEPHROSTOMY PLACEMENT RIGHT COMPARISON:  CT abdomen/ pelvis 02/23/2017 MEDICATIONS: Patient has already received intravenous Rocephin and cefepime. No additional antibiotics were administered secondary to renal function. ANESTHESIA/SEDATION: Fentanyl 25 mcg IV; Versed 1.5 mg IV Moderate Sedation Time:  20 minutes The patient was continuously monitored during the procedure by the interventional radiology nurse under my direct supervision. CONTRAST:  Approximately 10 mL Isovue 300 - administered into the collecting system(s) FLUOROSCOPY TIME:  Fluoroscopy Time: 2 minutes 30 seconds (27 mGy). COMPLICATIONS: None immediate. TECHNIQUE: The procedure, risks, benefits, and alternatives were explained to the patient. Questions regarding the procedure were encouraged and answered. The patient understands and consents to the procedure. The left flank was prepped with chlorhexidine in a sterile fashion, and a sterile drape was applied covering the operative field. A sterile gown and sterile gloves were used for the procedure. Local anesthesia was provided with 1% Lidocaine. The left flank was interrogated with ultrasound and the left kidney identified. The kidney is hydronephrotic. A suitable access site on the skin overlying the lower pole, posterior calix was identified. After local mg anesthesia was achieved, a small skin nick was made with an 11 blade scalpel. A 21 gauge Accustick needle was then advanced under direct sonographic  guidance into the lower pole of the left kidney. There was return  of frankly purulent urine through the needle hub. A 0.018 inch wire was advanced under fluoroscopic guidance into the left renal collecting system. The Accustick sheath was then advanced over the wire and a 0.018 system exchanged for a 0.035 system. Gentle hand injection of contrast material confirms placement of the sheath within the renal collecting system. There is significant hydronephrosis and irregularity of the collecting system. The tract from the scan into the renal collecting system was then dilated serially to 10-French. A 10-French Cook all-purpose drain was then placed and positioned under fluoroscopic guidance. The locking loop is well formed within the left renal pelvis. The catheter was secured to the skin with 2-0 Prolene and a sterile bandage was placed. Catheter was left to gravity bag drainage. Attention was next turned to the right kidney. The right flank was prepped with chlorhexidine in a sterile fashion, and a sterile drape was applied covering the operative field. A sterile gown and sterile gloves were used for the procedure. Local anesthesia was provided with 1% Lidocaine. The right flank was interrogated with ultrasound and the left kidney identified. The kidney is hydronephrotic. A suitable access site on the skin overlying the lower pole, posterior calix was identified. After local mg anesthesia was achieved, a small skin nick was made with an 11 blade scalpel. A 21 gauge Accustick needle was then advanced under direct sonographic guidance into the lower pole of the right kidney. There was return of frankly purulent urine. A sample was aspirated and sent for culture. A 0.018 inch wire was advanced under fluoroscopic guidance into the left renal collecting system. The Accustick sheath was then advanced over the wire and a 0.018 system exchanged for a 0.035 system. Gentle hand injection of contrast material confirms placement of the sheath within the renal collecting system. There is  significant hydronephrosis and irregularity of the collecting system. The tract from the scan into the renal collecting system was then dilated serially to 10-French. A 10-French Cook all-purpose drain was then placed and positioned under fluoroscopic guidance. The locking loop is well formed within the left renal pelvis. The catheter was secured to the skin with 2-0 Prolene and a sterile bandage was placed. Catheter was left to gravity bag drainage. IMPRESSION: Successful placement of bilateral 10 French percutaneous nephrostomy tubes. Urine is frankly purulent bilaterally. A sample was obtained and sent for culture. Signed, Criselda Peaches, MD [facility] Radiology Electronically Signed   By: Jacqulynn Cadet [name]   On: 02/23/2017 16:12    Labs:  CBC:  Recent Labs  02/22/17 2134 02/23/17 0450 02/24/17 0657 02/25/17 0320  WBC 15.0* 14.8* 11.2* 10.0  HGB 9.8* 8.4* 7.8* 7.8*  HCT 28.4* 24.8* 23.4* 23.6*  PLT 348 303 240 211    COAGS:  Recent Labs  10/01/16 1948 02/23/17 1420  INR 1.48 1.55  APTT 31  --     BMP:  Recent Labs  02/23/17 0921 02/23/17 1150 02/24/17 0657 02/25/17 0320  NA 135 136 139  139 144  K 6.7* 7.0* 4.9  4.8 3.9  CL 104 105 100*  103 99*  CO2 14* 12* 20*  19* 27  GLUCOSE 97 85 86  88 105*  BUN 116* 122* 111*  110* 94*  CALCIUM 8.7* 8.6* 8.3*  8.2* 8.0*  CREATININE 17.24* 16.65* 16.10*  15.76* 13.87*  GFRNONAA 2* 2* 2*  2* 3*  GFRAA 2*  3* 3*  3* 3*    LIVER FUNCTION TESTS:  Recent Labs  10/01/16 1618 12/08/16 1754  02/23/17 0450 02/23/17 1150 02/24/17 0657 02/25/17 0320  BILITOT 0.4 0.5  --   --   --  0.6 0.4  AST 55* 18  --   --   --  14* 16  ALT 35 10*  --   --   --  9* 10*  ALKPHOS 62 48  --   --   --  43 48  PROT 8.1 7.6  --   --   --  5.8* 5.8*  ALBUMIN 2.7* 2.7*  < > 2.3* 1.8* 1.7*  1.7* 1.7*  < > = values in this interval not displayed.  Assessment and Plan: 1.  Bilateral hydronephrosis, s/p B PCN placement -both are draining, but right is draining much more than left.   -cont irrigations -further plans regarding care of PCNs to urology. -will follow  Electronically Signed: Aneeka Bowden E 02/25/2017, 2:23 PM   I spent a total of 15 Minutes at the the patient's bedside AND on the patient's hospital floor or unit, greater than 50% of which was counseling/coordinating care for bilateral hydrnoephrosis

## 2017-02-25 NOTE — Progress Notes (Signed)
PT Cancellation Note  Patient Details Name: [name] MRN: [id number] DOB: 04-08-1931   Cancelled Treatment:    Reason Eval/Treat Not Completed: Patient declined. Pt in room with wife and daughter present.  They are requesting PT hold off today as pt just received some "heavy news" and family discussing with him.  Will check back tomorrow.   Andri Prestia LUBECK 02/25/2017, 1:44 PM

## 2017-02-25 NOTE — Consult Note (Signed)
[facility] [employer] Received request from [facility] for family interest in [facility]. Chart reviewed and met with spouse to complete paper work for transfer to [employer] [facility] 02/26/17. Dr. Orpah Melter to assume care per family preference.   Please fax discharge summary to [phone].  RN please call report to [phone].  Thank you,  Erling Conte, LCSW [phone]

## 2017-02-25 NOTE — Progress Notes (Signed)
Daily Progress Note   Patient Name: [name]       Date: 02/25/2017 DOB: 1931-06-23  Age: 83 y.o. MRN#: [id number] Attending Physician: Reyne Dumas, MD Primary Care Physician: [name], MD Admit Date: 02/22/2017  Reason for Consultation/Follow-up: Establishing goals of care and Psychosocial/spiritual support  Subjective: [name] was alert and awake when I visited today. He was oriented to person, place, and time, but confused on the situation. He could not recall talking with Palliative Care yesterday, nor could he relate why he was at the hospital or what had been done since admission. His wife and youngest daughter were present during our meeting.   Length of Stay: 2  Current Medications: Scheduled Meds:  . ceFEPime (MAXIPIME) IV  500 mg Intravenous Q24H  . multivitamin with minerals  1 tablet Oral Daily    Continuous Infusions: . sodium chloride 75 mL/hr at 02/24/17 1826  .  sodium bicarbonate (isotonic) infusion in sterile water 125 mL/hr at 02/25/17 0848    PRN Meds: acetaminophen, fentaNYL (SUBLIMAZE) injection, traMADol  Physical Exam  Constitutional: He appears well-developed and well-nourished. No distress.  HENT:  Head: Normocephalic and atraumatic.  Mouth/Throat: Oropharynx is clear and moist. No oropharyngeal exudate.  Eyes: EOM are normal.  Neck: Normal range of motion. Neck supple.  Cardiovascular: Normal rate.   Pulmonary/Chest: Effort normal.  Genitourinary:  Genitourinary Comments: Bilateral nephrostomy tubes, urine noted in bags  Neurological: He is alert.  Short-term memory deficits noted, confused on situation  Skin: Skin is warm and dry.  Psychiatric: He has a normal mood and affect. Judgment and thought content normal. His speech is  delayed. He is slowed. He exhibits abnormal recent memory.  Nursing note and vitals reviewed.           Vital Signs: BP 120/89 (BP Location: Right Arm)   Pulse 87   Temp 98.4 F (36.9 C) (Oral)   Resp 19   Ht 6\' 3"  (1.905 m)   Wt 101.2 kg (223 lb 1.6 oz)   SpO2 99%   BMI 27.89 kg/m  SpO2: SpO2: 99 % O2 Device: O2 Device: Nasal Cannula O2 Flow Rate: O2 Flow Rate (L/min): 1.5 L/min  Intake/output summary:   Intake/Output Summary (Last 24 hours) at 02/25/17 0867 Last data filed at 02/25/17 0830  Gross per 24  hour  Intake             5365 ml  Output             6250 ml  Net             -885 ml   LBM: Last BM Date: 02/24/17 Baseline Weight: Weight: 107 kg (235 lb 14.3 oz) Most recent weight: Weight: 101.2 kg (223 lb 1.6 oz)       Palliative Assessment/Data: PPS 50%   Flowsheet Rows     Most Recent Value  Intake Tab  Referral Department  Hospitalist  Unit at Time of Referral  Intermediate Care Unit  Palliative Care Primary Diagnosis  Cancer  Date Notified  02/23/17  Palliative Care Type  New Palliative care  Reason for referral  Clarify Goals of Care  Date of Admission  02/23/17  Date first seen by Palliative Care  02/23/17  # of days Palliative referral response time  0 Day(s)  # of days IP prior to Palliative referral  0  Clinical Assessment  Palliative Performance Scale Score  20%  Pain Max last 24 hours  Not able to report  Pain Min Last 24 hours  Not able to report  Dyspnea Max Last 24 Hours  Not able to report  Dyspnea Min Last 24 hours  Not able to report  Nausea Max Last 24 Hours  Not able to report  Nausea Min Last 24 Hours  Not able to report  Anxiety Max Last 24 Hours  Not able to report  Anxiety Min Last 24 Hours  Not able to report  Other Max Last 24 Hours  Not able to report  Psychosocial & Spiritual Assessment  Palliative Care Outcomes  Patient/Family meeting held?  Yes  Who was at the meeting?  wife  Palliative Care Outcomes  Clarified goals  of care, Changed CPR status  Patient/Family wishes: Interventions discontinued/not started   Mechanical Ventilation  Palliative Care follow-up planned  Yes, Facility      Patient Active Problem List   Diagnosis Date Noted  . Hematochezia 02/23/2017  . Prostate CA ([place]) 02/23/2017  . Acidemia   . Palliative care encounter   . Hyperkalemia 12/08/2016  . Flu-like symptoms 12/08/2016  . Sepsis due to urinary tract infection ([place]) 10/01/2016  . Generalized weakness   . Acute lower UTI 09/01/2016  . Bladder cancer ([place]) 09/01/2016  . Neuropathic pain   . Contact dermatitis   . AKI (acute kidney injury) ([place])   . Recurrent UTI   . Leukocytosis   . Absence of bladder continence   . Pain   . Hydrocele in adult   . Acute cystitis with hematuria   . Acute blood loss anemia   . Normocytic anemia   . Other secondary hypertension   . Acute idiopathic gout of left knee   . Urinary retention   . Debilitated 08/02/2016  . Bilateral hydronephrosis   . Normochromic normocytic anemia   . E-coli UTI   . Benign essential HTN   . Coronary artery disease involving coronary bypass graft of native heart without angina pectoris   . BPH (benign prostatic hyperplasia)   . Kidney stones   . CKD (chronic kidney disease), stage II   . Morbid obesity due to excess calories ([place])   . Acute renal failure (ARF) ([place]) 07/29/2016    Palliative Care Assessment & Plan   Patient Profile: 83 y.o. male  with past medical history of bladder cancer,  coronary artery disease with CABG, prostate surgery with penile prothesis (with resultant multiple UTIs), prostate  cancer, hyperlipidemia, hypertension, admitted on 02/22/2017 after rapid decline over the past 1-2 weeks as evidenced by no oral intake, no urine output, and diarrhea. They also noted bright red blood in his diaper for the past 2 days. He is also been verbalizing pain in his right lower abdomen and groin. Upon arrival to the emergency room he was afebrile  tachycardic with stable blood pressure.EKG showed right bundle branch block. Initial potassium was 6.8 on admission and serum creatinine was 17. Large infiltrating bladder mass noted on imaging. Mass with resultant bladder outlet obstruction, with subsequent placement of bilateral nephrostomy tubes. Creatinine improved   Assessment: Long discussion with pt, wife, and daughter today regarding his health course and options. I again explained the infiltrating bladder mass, emphasizing my concern for ongoing pain, high risk for bleeding, and recurrent dehydration issues. We also talked about his functional status and tolerance of invasive procedures. [name] was focused on controlling his pain. In light of his pain status, high risk for bleeding, poor oral intake and failure to thirve in the setting of cancer, I recommended residential hospice. He did agree to this plan, as did his family.   We are presently waiting on antibiotic sensitives for his UTI. Once obtained, he can be converted to an oral agent to finish his abx course, and is ready for discharge.   Recommendations/Plan:  Social work consult placed for residential hospice placement at [facility] PR[facility] Fentanyl for pain control, encouraged pt to request  Goals of Care and Additional Recommendations:  Limitations on Scope of Treatment: Full Scope Treatment  Code Status:    Code Status Orders        Start     Ordered   02/23/17 1452  Do not attempt resuscitation (DNR)  Continuous    Question Answer Comment  In the event of cardiac or respiratory ARREST Do not call a "code blue"   In the event of cardiac or respiratory ARREST Do not perform Intubation, CPR, defibrillation or ACLS   In the event of cardiac or respiratory ARREST Use medication by any route, position, wound care, and other measures to relive pain and suffering. May use oxygen, suction and manual treatment of airway obstruction as needed for comfort.       02/23/17 1451    Code Status History    Date Active Date Inactive Code Status Order ID Comments User Context   02/23/2017  4:29 AM 02/23/2017  2:50 PM Full Code [id number]  Vianne Bulls, MD ED   12/08/2016 11:08 PM 12/11/2016  3:48 PM Full Code [id number]  Vianne Bulls, MD ED   10/01/2016  7:43 PM 10/03/2016  9:42 PM Full Code [id number]  Vianne Bulls, MD ED   09/01/2016 11:26 PM 09/04/2016  3:09 PM Full Code [id number]  Reubin Milan, MD Inpatient   08/02/2016  9:32 PM 08/02/2016  9:32 PM Full Code [id number]  Cathlyn Parsons, PA-C Inpatient   08/02/2016  9:32 PM 08/16/2016  2:36 PM Full Code [id number]  Lavon Paganini [place], PA-C Inpatient   07/29/2016  2:42 AM 08/02/2016  9:16 PM Full Code [id number]  Dixie Dials, MD ED       Prognosis:  Less than 4 weeks in the setting of infiltrative bladder mass invading sigmoid colon. High risk for GI bleed due to invading mass. Pt's intake is poor with progressive FTT reported by  family over the past few weeks. High risk for acute decompensation.   Discharge Planning:  Hospice facility  Care plan was discussed with Dr. Allyson Sabal, pt's wife and youngest daughter.  Thank you for allowing the Palliative Medicine Team to assist in the care of this patient.  Total time: 75 minutes Time in: 1230 Time out: 1330    Greater than 50%  of this time was spent counseling and coordinating care related to the above assessment and plan.  Jannette Fogo, NP  Please contact Palliative Medicine Team phone at [phone] for questions and concerns.

## 2017-02-25 NOTE — Progress Notes (Signed)
[facility] PROGRESS NOTE  [name] KAJ:[id number] DOB: [birth year] DOA: 02/22/2017   PCP: [name], MD     Assessment/Plan: Principal Problem:   AKI (acute kidney injury) ([place]) Active Problems:   Benign essential HTN   Coronary artery disease involving coronary bypass graft of native heart without angina pectoris   CKD (chronic kidney disease), stage II   Normocytic anemia   Acute lower UTI   Bladder cancer ([place])   Hematochezia   Prostate CA ([place])   Acidemia   Palliative care encounter   [name] with and extensive PMH significant for HTN, CAD s/p CABG, prostate cancer s/p multiple TURBT with recurrent UTI's, as well as bladder cancer (receives Urologic care through the [facility] at [facility] and has had "scraping of the bladder" several times) who was brought to [facility] with a 2 week history of malaise, anorexia, and FTT.  His wife reports that he had diarrhea for the last week and also had some blood in his stools.Labs upon arrival were notable for a potassium of 6.8 and a BUN/Cr of 121 / 17.69. Previously creatinine was 1.73.   nephrology consulted to further evaluate and manage his AKI/CKD and hyperkalemia.  urology consulted for Foley placement. Patient is followed by Caren Macadam, MD , urologist at [facility] and plan  AKI/CKD in the setting of  bladder outlet obstruction-, bladder cancer, prostate cancer  in setting of volume depletion and possible bladder outlet obstruction in the setting of prostate and bladder cancer. Continue IVF's , urology consulted for Foley placement but was unable. Multiple attempts were made to place a foley catheter which were unsuccessful due to the large bladder mass.  patient declined cystoscopy in November of last year.    .   status post IR placement of  bilateral perc nephrostomy. He is not a candidate for long-term hemodialysis. Per palliative care continue all medical measures, including fluids, antibiotics, blood  transfusions  Hyperkalemia- due to #1 and metabolic acidosis.  Treated with IV calcium, insulin/D50 and now on bicarb drip.  Repeat labs and hopefully we can avoid HD with conservative therapy.  Per ED no EKG changes consistent with Hyperkalemia. Received several doses of Kayexalate 3/17. Resolved after IR placed bilateral PNT"s.    Pyuria/hematuria- urine culture positive for Pseudomonas, sensitivity pending, continue cefepime  Bladder cancer,Prostate cancer-followed at [facility] urology consulted this admission  Anemia- normocytic with h/o bladder and prostate cancer as well as ckd stage 2-3 and gross hematuria and hematochezia. Hemoglobin at baseline around 9.5, hemoglobin dropped to 7.8 today. CT abdomen shows bilateral hydronephrosis and hydroureter, likely causing abdominal pain. Bladder mass is progressing, likely invasion into the adjacent sigmoid colon. This is likely the cause of blood in the stool.     DVT prophylaxsis SCDs  Code Status: DO NOT RESUSCITATE  Family Communication: Discussed in detail with the patient and his wife all imaging results, lab results explained to the patient   Disposition Plan:  Anticipate  decline in the next few days, appropriate for [facility] place , discussed with wife and informed her that a quick decline is expected       Consultants:  Urology  Nephrology  Palliative care  Procedures:  None  Antibiotics: Anti-infectives    Start     Dose/Rate Route Frequency Ordered Stop   02/24/17 0600  ceFEPIme (MAXIPIME) 500 mg in dextrose 5 % 50 mL IVPB     500 mg 100  mL/hr over 30 Minutes Intravenous Every 24 hours 02/23/17 0447     02/23/17 1500  cefTRIAXone (ROCEPHIN) 2 g in dextrose 5 % 50 mL IVPB  Status:  Discontinued     2 g 100 mL/hr over 30 Minutes Intravenous  Once 02/23/17 1455 02/23/17 1611   02/23/17 0500  ceFEPIme (MAXIPIME) 2 g in dextrose 5 % 50 mL IVPB     2 g 100 mL/hr over 30 Minutes Intravenous  Once 02/23/17 0445  02/23/17 0618   02/23/17 0230  cefTRIAXone (ROCEPHIN) 2 g in dextrose 5 % 50 mL IVPB     2 g 100 mL/hr over 30 Minutes Intravenous  Once 02/23/17 0217 02/23/17 0329         HPI/Subjective: Very lethargic , but arousable , does not understand what he is being told   Objective: Vitals:   02/24/17 2007 02/25/17 0042 02/25/17 0424 02/25/17 0732  BP: 126/82 135/78 121/81 120/89  Pulse: 84 82 79 87  Resp: 19 18 18 19   Temp: 97.1 F (36.2 C) 97.8 F (36.6 C) 97.6 F (36.4 C) 98.4 F (36.9 C)  TempSrc: Oral Axillary Oral Oral  SpO2: 96% 100% 100% 99%  Weight:   101.2 kg (223 lb 1.6 oz)   Height:        Intake/Output Summary (Last 24 hours) at 02/25/17 0826 Last data filed at 02/25/17 0715  Gross per 24 hour  Intake             4095 ml  Output             5950 ml  Net            -1855 ml    Exam:  Examination:  General exam: Appears calm and comfortable  Respiratory system: Clear to auscultation. Respiratory effort normal. Cardiovascular system: S1 & S2 heard, RRR. No JVD, murmurs, rubs, gallops or clicks. No pedal edema. Gastrointestinal system: Abdomen is nondistended, soft and nontender. No organomegaly or masses felt. Normal bowel sounds heard. Central nervous system: Alert and oriented. No focal neurological deficits. Extremities: Symmetric 5 x 5 power. Skin: No rashes, lesions or ulcers Psychiatry: Judgement and insight appear normal. Mood & affect appropriate.     Data Reviewed: I have personally reviewed following labs and imaging studies  Micro Results Recent Results (from the past 240 hour(s))  Urine culture     Status: Abnormal   Collection Time: 02/23/17 12:47 AM  Result Value Ref Range Status   Specimen Description URINE, CLEAN CATCH  Final   Special Requests NONE  Final   Culture (A)  Final    >=100,000 COLONIES/mL DIPHTHEROIDS(CORYNEBACTERIUM SPECIES) Standardized susceptibility testing for this organism is not available. Performed at [facility], [place] [address] [place], [place] [address]    Report Status 02/24/2017 FINAL  Final  MRSA PCR Screening     Status: None   Collection Time: 02/23/17  6:44 AM  Result Value Ref Range Status   MRSA by PCR NEGATIVE NEGATIVE Final    Comment:        The GeneXpert MRSA Assay (FDA approved for NASAL specimens only), is one component of a comprehensive MRSA colonization surveillance program. It is not intended to diagnose MRSA infection nor to guide or monitor treatment for MRSA infections.   Urine culture     Status: Abnormal (Preliminary result)   Collection Time: 02/23/17  3:56 PM  Result Value Ref Range Status   Specimen Description KIDNEY RIGHT  Final  Special Requests NONE  Final   Culture (A)  Final    60,000 COLONIES/mL PSEUDOMONAS AERUGINOSA SUSCEPTIBILITIES TO FOLLOW    Report Status PENDING  Incomplete    Radiology Reports US Renal  Result Date: 02/23/2017 CLINICAL DATA:  Acute renal failure EXAM: RENAL / URINARY TRACT ULTRASOUND COMPLETE COMPARISON:  Renal ultrasound 09/02/2016 CT abdomen pelvis 07/28/2016 FINDINGS: Right Kidney: Length: 13.2 cm. There is severe right hydroureteronephrosis with the proximal ureter measuring up to 2.5 cm and the distal ureter measuring up 1.9 cm. No calculus is identified. No solid renal mass. Left Kidney: The left kidney could not be visualized, as the patient was lying on his left side and could not be repositioned. Bladder: The urinary bladder is not distended. There is a masslike area of heterogeneous echotexture in the lower pelvis, measuring 9.1 x 7 x 8 cm. IMPRESSION: 1. Severe right hydroureteronephrosis. 2. Masslike area of heterogeneous echotexture at the expected location of the urinary bladder. Further evaluation with CT or MRI of the abdomen and pelvis is recommended. Electronically Signed   By: Ulyses Jarred [name]   On: 02/23/2017 01:57   Ct Renal Stone Study  Result Date: 02/23/2017 CLINICAL DATA:  Patient is  dehydrated. No p.o. intake for 2 weeks. No urinary output. Penile infection and bleeding. History of stones. Bladder cancer. Recurrent prostate cancer. EXAM: CT ABDOMEN AND PELVIS WITHOUT CONTRAST TECHNIQUE: Multidetector CT imaging of the abdomen and pelvis was performed following the standard protocol without IV contrast. COMPARISON:  07/28/2016 FINDINGS: Lower chest: Small bilateral pleural effusions with basilar atelectasis. Fibrosis in the lung bases. Postoperative changes in the mediastinum consistent with bypass grafts. Hepatobiliary: Examination is limited due to motion artifact. No focal liver lesions appreciated. Gallbladder and bile ducts are grossly unremarkable. Pancreas: Unremarkable. No pancreatic ductal dilatation or surrounding inflammatory changes. Spleen: Normal in size without focal abnormality. Adrenals/Urinary Tract: No adrenal gland nodules. There is prominent bilateral hydronephrosis and hydroureter. Hydronephrosis is progressing since prior study. There is a 9 mm stone in the distal right ureter. On the previous study, this stone was in the kidney. There is a heterogeneous mass involving the bladder with infiltration and a surrounding tissues. The mass may be contributing to bilateral ureteral obstruction. Difficult to separate the bladder from the mass on noncontrast imaging. Altogether, the bladder/mass measures about 6.6 x 9 cm. There is progression since the previous study. Stomach/Bowel: Stomach and small bowel are decompressed. Stool-filled colon without abnormal distention. Diverticulosis of the sigmoid colon with probable muscular hypertrophy. Difficult the separated portion of the sigmoid colon from the bladder mass and involvement of the colon is suspected. Appendix is not identified. Vascular/Lymphatic: Aortic atherosclerosis. No enlarged abdominal or pelvic lymph nodes. Reproductive: Postoperative changes in the prostate gland. Penile prosthesis. Other: Small left inguinal hernia  containing fat. No free air or free fluid in the abdomen. Musculoskeletal: Degenerative changes in the spine and hips. No destructive bone lesions. IMPRESSION: Prominent bilateral hydronephrosis and hydroureter. There is a 9 mm stone in the distal right ureter. The bladder wall is irregular with prominent soft tissue, suggesting an infiltrating mass. This may be causing obstruction of the left ureter. The mass is progressing since previous study and now demonstrates likely invasion into the adjacent sigmoid colon. Prostate gland is surgically absent. Penile prosthesis. Small bilateral pleural effusions with atelectasis or consolidation in the lung bases. Aortic atherosclerosis. Small left inguinal hernia containing fat. Electronically Signed   By: Lucienne Capers [name]   On: 02/23/2017 06:18  Ir Nephrostomy Placement Left  Result Date: 02/23/2017 INDICATION: 83 year old male with acute renal failure and bilateral obstructed pyonephrosis secondary to a primary bladder process, likely bladder cancer. He presents for urgent decompression with bilateral nephrostomy tubes. EXAM: IR NEPHROSTOMY PLACEMENT LEFT; IR NEPHROSTOMY PLACEMENT RIGHT COMPARISON:  CT abdomen/ pelvis 02/23/2017 MEDICATIONS: Patient has already received intravenous Rocephin and cefepime. No additional antibiotics were administered secondary to renal function. ANESTHESIA/SEDATION: Fentanyl 25 mcg IV; Versed 1.5 mg IV Moderate Sedation Time:  20 minutes The patient was continuously monitored during the procedure by the interventional radiology nurse under my direct supervision. CONTRAST:  Approximately 10 mL Isovue 300 - administered into the collecting system(s) FLUOROSCOPY TIME:  Fluoroscopy Time: 2 minutes 30 seconds (27 mGy). COMPLICATIONS: None immediate. TECHNIQUE: The procedure, risks, benefits, and alternatives were explained to the patient. Questions regarding the procedure were encouraged and answered. The patient understands and consents  to the procedure. The left flank was prepped with chlorhexidine in a sterile fashion, and a sterile drape was applied covering the operative field. A sterile gown and sterile gloves were used for the procedure. Local anesthesia was provided with 1% Lidocaine. The left flank was interrogated with ultrasound and the left kidney identified. The kidney is hydronephrotic. A suitable access site on the skin overlying the lower pole, posterior calix was identified. After local mg anesthesia was achieved, a small skin nick was made with an 11 blade scalpel. A 21 gauge Accustick needle was then advanced under direct sonographic guidance into the lower pole of the left kidney. There was return of frankly purulent urine through the needle hub. A 0.018 inch wire was advanced under fluoroscopic guidance into the left renal collecting system. The Accustick sheath was then advanced over the wire and a 0.018 system exchanged for a 0.035 system. Gentle hand injection of contrast material confirms placement of the sheath within the renal collecting system. There is significant hydronephrosis and irregularity of the collecting system. The tract from the scan into the renal collecting system was then dilated serially to 10-French. A 10-French Cook all-purpose drain was then placed and positioned under fluoroscopic guidance. The locking loop is well formed within the left renal pelvis. The catheter was secured to the skin with 2-0 Prolene and a sterile bandage was placed. Catheter was left to gravity bag drainage. Attention was next turned to the right kidney. The right flank was prepped with chlorhexidine in a sterile fashion, and a sterile drape was applied covering the operative field. A sterile gown and sterile gloves were used for the procedure. Local anesthesia was provided with 1% Lidocaine. The right flank was interrogated with ultrasound and the left kidney identified. The kidney is hydronephrotic. A suitable access site on the  skin overlying the lower pole, posterior calix was identified. After local mg anesthesia was achieved, a small skin nick was made with an 11 blade scalpel. A 21 gauge Accustick needle was then advanced under direct sonographic guidance into the lower pole of the right kidney. There was return of frankly purulent urine. A sample was aspirated and sent for culture. A 0.018 inch wire was advanced under fluoroscopic guidance into the left renal collecting system. The Accustick sheath was then advanced over the wire and a 0.018 system exchanged for a 0.035 system. Gentle hand injection of contrast material confirms placement of the sheath within the renal collecting system. There is significant hydronephrosis and irregularity of the collecting system. The tract from the scan into the renal collecting system was then  dilated serially to 10-French. A 10-French Cook all-purpose drain was then placed and positioned under fluoroscopic guidance. The locking loop is well formed within the left renal pelvis. The catheter was secured to the skin with 2-0 Prolene and a sterile bandage was placed. Catheter was left to gravity bag drainage. IMPRESSION: Successful placement of bilateral 10 French percutaneous nephrostomy tubes. Urine is frankly purulent bilaterally. A sample was obtained and sent for culture. Signed, Criselda Peaches, MD [facility] Radiology Electronically Signed   By: Jacqulynn Cadet [name]   On: 02/23/2017 16:12   Ir Nephrostomy Placement Right  Result Date: 02/23/2017 INDICATION: 83 year old male with acute renal failure and bilateral obstructed pyonephrosis secondary to a primary bladder process, likely bladder cancer. He presents for urgent decompression with bilateral nephrostomy tubes. EXAM: IR NEPHROSTOMY PLACEMENT LEFT; IR NEPHROSTOMY PLACEMENT RIGHT COMPARISON:  CT abdomen/ pelvis 02/23/2017 MEDICATIONS: Patient has already received intravenous  Rocephin and cefepime. No additional antibiotics were administered secondary to renal function. ANESTHESIA/SEDATION: Fentanyl 25 mcg IV; Versed 1.5 mg IV Moderate Sedation Time:  20 minutes The patient was continuously monitored during the procedure by the interventional radiology nurse under my direct supervision. CONTRAST:  Approximately 10 mL Isovue 300 - administered into the collecting system(s) FLUOROSCOPY TIME:  Fluoroscopy Time: 2 minutes 30 seconds (27 mGy). COMPLICATIONS: None immediate. TECHNIQUE: The procedure, risks, benefits, and alternatives were explained to the patient. Questions regarding the procedure were encouraged and answered. The patient understands and consents to the procedure. The left flank was prepped with chlorhexidine in a sterile fashion, and a sterile drape was applied covering the operative field. A sterile gown and sterile gloves were used for the procedure. Local anesthesia was provided with 1% Lidocaine. The left flank was interrogated with ultrasound and the left kidney identified. The kidney is hydronephrotic. A suitable access site on the skin overlying the lower pole, posterior calix was identified. After local mg anesthesia was achieved, a small skin nick was made with an 11 blade scalpel. A 21 gauge Accustick needle was then advanced under direct sonographic guidance into the lower pole of the left kidney. There was return of frankly purulent urine through the needle hub. A 0.018 inch wire was advanced under fluoroscopic guidance into the left renal collecting system. The Accustick sheath was then advanced over the wire and a 0.018 system exchanged for a 0.035 system. Gentle hand injection of contrast material confirms placement of the sheath within the renal collecting system. There is significant hydronephrosis and irregularity of the collecting system. The tract from the scan into the renal collecting system was then dilated serially to 10-French. A 10-French Cook  all-purpose drain was then placed and positioned under fluoroscopic guidance. The locking loop is well formed within the left renal pelvis. The catheter was secured to the skin with 2-0 Prolene and a sterile bandage was placed. Catheter was left to gravity bag drainage. Attention was next turned to the right kidney. The right flank was prepped with chlorhexidine in a sterile fashion, and a sterile drape was applied covering the operative field. A sterile gown and sterile gloves were used for the procedure. Local anesthesia was provided with 1% Lidocaine. The right flank was interrogated with ultrasound and the left kidney identified. The kidney is hydronephrotic. A suitable access site on the skin overlying the lower pole, posterior calix was identified. After local mg anesthesia was achieved, a small skin nick was made with an 11 blade scalpel. A 21 gauge Accustick  needle was then advanced under direct sonographic guidance into the lower pole of the right kidney. There was return of frankly purulent urine. A sample was aspirated and sent for culture. A 0.018 inch wire was advanced under fluoroscopic guidance into the left renal collecting system. The Accustick sheath was then advanced over the wire and a 0.018 system exchanged for a 0.035 system. Gentle hand injection of contrast material confirms placement of the sheath within the renal collecting system. There is significant hydronephrosis and irregularity of the collecting system. The tract from the scan into the renal collecting system was then dilated serially to 10-French. A 10-French Cook all-purpose drain was then placed and positioned under fluoroscopic guidance. The locking loop is well formed within the left renal pelvis. The catheter was secured to the skin with 2-0 Prolene and a sterile bandage was placed. Catheter was left to gravity bag drainage. IMPRESSION: Successful placement of bilateral 10 French percutaneous nephrostomy tubes. Urine is frankly  purulent bilaterally. A sample was obtained and sent for culture. Signed, Criselda Peaches, MD [facility] Radiology Electronically Signed   By: Jacqulynn Cadet [name]   On: 02/23/2017 16:12     CBC  Recent Labs Lab 02/22/17 2134 02/23/17 0450 02/24/17 0657 02/25/17 0320  WBC 15.0* 14.8* 11.2* 10.0  HGB 9.8* 8.4* 7.8* 7.8*  HCT 28.4* 24.8* 23.4* 23.6*  PLT 348 303 240 211  MCV 80.2 82.1 81.0 81.9  MCH 27.7 27.8 27.0 27.1  MCHC 34.5 33.9 33.3 33.1  RDW 15.3 15.8* 15.9* 16.0*    Chemistries   Recent Labs Lab 02/23/17 0055 02/23/17 0450 02/23/17 0921 02/23/17 1150 02/24/17 0657 02/25/17 0320  NA 131* 135 135 136 139  139 144  K 5.9* 5.9* 6.7* 7.0* 4.9  4.8 3.9  CL 105 105 104 105 100*  103 99*  CO2 11* 11* 14* 12* 20*  19* 27  GLUCOSE 141* 104* 97 85 86  88 105*  BUN 107* 101* 116* 122* 111*  110* 94*  CREATININE 16.84* 17.05* 17.24* 16.65* 16.10*  15.76* 13.87*  CALCIUM 8.6* 9.0 8.7* 8.6* 8.3*  8.2* 8.0*  MG 2.4  --   --   --  2.1  --   AST  --   --   --   --  14* 16  ALT  --   --   --   --  9* 10*  ALKPHOS  --   --   --   --  43 48  BILITOT  --   --   --   --  0.6 0.4   ------------------------------------------------------------------------------------------------------------------ estimated creatinine clearance is 4.7 mL/min (A) (by C-G formula based on SCr of 13.87 mg/dL (H)). ------------------------------------------------------------------------------------------------------------------ No results for input(s): HGBA1C in the last 72 hours. ------------------------------------------------------------------------------------------------------------------ No results for input(s): CHOL, HDL, LDLCALC, TRIG, CHOLHDL, LDLDIRECT in the last 72 hours. ------------------------------------------------------------------------------------------------------------------ No results for input(s): TSH, T4TOTAL, T3FREE,  THYROIDAB in the last 72 hours.  Invalid input(s): FREET3 ------------------------------------------------------------------------------------------------------------------ No results for input(s): VITAMINB12, FOLATE, FERRITIN, TIBC, IRON, RETICCTPCT in the last 72 hours.  Coagulation profile  Recent Labs Lab 02/23/17 1420  INR 1.55    No results for input(s): DDIMER in the last 72 hours.  Cardiac Enzymes No results for input(s): CKMB, TROPONINI, MYOGLOBIN in the last 168 hours.  Invalid input(s): CK ------------------------------------------------------------------------------------------------------------------ Invalid input(s): POCBNP   CBG:  Recent Labs Lab 02/23/17 0007 02/23/17 0132 02/23/17 0815 02/24/17 0854 02/25/17 0730  GLUCAP 116* 108*  107* 84 98       Studies: Ir Nephrostomy Placement Left  Result Date: 02/23/2017 INDICATION: 83 year old male with acute renal failure and bilateral obstructed pyonephrosis secondary to a primary bladder process, likely bladder cancer. He presents for urgent decompression with bilateral nephrostomy tubes. EXAM: IR NEPHROSTOMY PLACEMENT LEFT; IR NEPHROSTOMY PLACEMENT RIGHT COMPARISON:  CT abdomen/ pelvis 02/23/2017 MEDICATIONS: Patient has already received intravenous Rocephin and cefepime. No additional antibiotics were administered secondary to renal function. ANESTHESIA/SEDATION: Fentanyl 25 mcg IV; Versed 1.5 mg IV Moderate Sedation Time:  20 minutes The patient was continuously monitored during the procedure by the interventional radiology nurse under my direct supervision. CONTRAST:  Approximately 10 mL Isovue 300 - administered into the collecting system(s) FLUOROSCOPY TIME:  Fluoroscopy Time: 2 minutes 30 seconds (27 mGy). COMPLICATIONS: None immediate. TECHNIQUE: The procedure, risks, benefits, and alternatives were explained to the patient. Questions regarding the procedure were encouraged and answered. The patient  understands and consents to the procedure. The left flank was prepped with chlorhexidine in a sterile fashion, and a sterile drape was applied covering the operative field. A sterile gown and sterile gloves were used for the procedure. Local anesthesia was provided with 1% Lidocaine. The left flank was interrogated with ultrasound and the left kidney identified. The kidney is hydronephrotic. A suitable access site on the skin overlying the lower pole, posterior calix was identified. After local mg anesthesia was achieved, a small skin nick was made with an 11 blade scalpel. A 21 gauge Accustick needle was then advanced under direct sonographic guidance into the lower pole of the left kidney. There was return of frankly purulent urine through the needle hub. A 0.018 inch wire was advanced under fluoroscopic guidance into the left renal collecting system. The Accustick sheath was then advanced over the wire and a 0.018 system exchanged for a 0.035 system. Gentle hand injection of contrast material confirms placement of the sheath within the renal collecting system. There is significant hydronephrosis and irregularity of the collecting system. The tract from the scan into the renal collecting system was then dilated serially to 10-French. A 10-French Cook all-purpose drain was then placed and positioned under fluoroscopic guidance. The locking loop is well formed within the left renal pelvis. The catheter was secured to the skin with 2-0 Prolene and a sterile bandage was placed. Catheter was left to gravity bag drainage. Attention was next turned to the right kidney. The right flank was prepped with chlorhexidine in a sterile fashion, and a sterile drape was applied covering the operative field. A sterile gown and sterile gloves were used for the procedure. Local anesthesia was provided with 1% Lidocaine. The right flank was interrogated with ultrasound and the left kidney identified. The kidney is hydronephrotic. A  suitable access site on the skin overlying the lower pole, posterior calix was identified. After local mg anesthesia was achieved, a small skin nick was made with an 11 blade scalpel. A 21 gauge Accustick needle was then advanced under direct sonographic guidance into the lower pole of the right kidney. There was return of frankly purulent urine. A sample was aspirated and sent for culture. A 0.018 inch wire was advanced under fluoroscopic guidance into the left renal collecting system. The Accustick sheath was then advanced over the wire and a 0.018 system exchanged for a 0.035 system. Gentle hand injection of contrast material confirms placement of the sheath within the renal collecting system. There is significant hydronephrosis and irregularity of the collecting system. The tract  from the scan into the renal collecting system was then dilated serially to 10-French. A 10-French Cook all-purpose drain was then placed and positioned under fluoroscopic guidance. The locking loop is well formed within the left renal pelvis. The catheter was secured to the skin with 2-0 Prolene and a sterile bandage was placed. Catheter was left to gravity bag drainage. IMPRESSION: Successful placement of bilateral 10 French percutaneous nephrostomy tubes. Urine is frankly purulent bilaterally. A sample was obtained and sent for culture. Signed, Criselda Peaches, MD [facility] Radiology Electronically Signed   By: Jacqulynn Cadet [name]   On: 02/23/2017 16:12   Ir Nephrostomy Placement Right  Result Date: 02/23/2017 INDICATION: 83 year old male with acute renal failure and bilateral obstructed pyonephrosis secondary to a primary bladder process, likely bladder cancer. He presents for urgent decompression with bilateral nephrostomy tubes. EXAM: IR NEPHROSTOMY PLACEMENT LEFT; IR NEPHROSTOMY PLACEMENT RIGHT COMPARISON:  CT abdomen/ pelvis 02/23/2017 MEDICATIONS: Patient has already  received intravenous Rocephin and cefepime. No additional antibiotics were administered secondary to renal function. ANESTHESIA/SEDATION: Fentanyl 25 mcg IV; Versed 1.5 mg IV Moderate Sedation Time:  20 minutes The patient was continuously monitored during the procedure by the interventional radiology nurse under my direct supervision. CONTRAST:  Approximately 10 mL Isovue 300 - administered into the collecting system(s) FLUOROSCOPY TIME:  Fluoroscopy Time: 2 minutes 30 seconds (27 mGy). COMPLICATIONS: None immediate. TECHNIQUE: The procedure, risks, benefits, and alternatives were explained to the patient. Questions regarding the procedure were encouraged and answered. The patient understands and consents to the procedure. The left flank was prepped with chlorhexidine in a sterile fashion, and a sterile drape was applied covering the operative field. A sterile gown and sterile gloves were used for the procedure. Local anesthesia was provided with 1% Lidocaine. The left flank was interrogated with ultrasound and the left kidney identified. The kidney is hydronephrotic. A suitable access site on the skin overlying the lower pole, posterior calix was identified. After local mg anesthesia was achieved, a small skin nick was made with an 11 blade scalpel. A 21 gauge Accustick needle was then advanced under direct sonographic guidance into the lower pole of the left kidney. There was return of frankly purulent urine through the needle hub. A 0.018 inch wire was advanced under fluoroscopic guidance into the left renal collecting system. The Accustick sheath was then advanced over the wire and a 0.018 system exchanged for a 0.035 system. Gentle hand injection of contrast material confirms placement of the sheath within the renal collecting system. There is significant hydronephrosis and irregularity of the collecting system. The tract from the scan into the renal collecting system was then dilated serially to 10-French. A  10-French Cook all-purpose drain was then placed and positioned under fluoroscopic guidance. The locking loop is well formed within the left renal pelvis. The catheter was secured to the skin with 2-0 Prolene and a sterile bandage was placed. Catheter was left to gravity bag drainage. Attention was next turned to the right kidney. The right flank was prepped with chlorhexidine in a sterile fashion, and a sterile drape was applied covering the operative field. A sterile gown and sterile gloves were used for the procedure. Local anesthesia was provided with 1% Lidocaine. The right flank was interrogated with ultrasound and the left kidney identified. The kidney is hydronephrotic. A suitable access site on the skin overlying the lower pole, posterior calix was identified. After local mg anesthesia was achieved, a small skin nick was  made with an 11 blade scalpel. A 21 gauge Accustick needle was then advanced under direct sonographic guidance into the lower pole of the right kidney. There was return of frankly purulent urine. A sample was aspirated and sent for culture. A 0.018 inch wire was advanced under fluoroscopic guidance into the left renal collecting system. The Accustick sheath was then advanced over the wire and a 0.018 system exchanged for a 0.035 system. Gentle hand injection of contrast material confirms placement of the sheath within the renal collecting system. There is significant hydronephrosis and irregularity of the collecting system. The tract from the scan into the renal collecting system was then dilated serially to 10-French. A 10-French Cook all-purpose drain was then placed and positioned under fluoroscopic guidance. The locking loop is well formed within the left renal pelvis. The catheter was secured to the skin with 2-0 Prolene and a sterile bandage was placed. Catheter was left to gravity bag drainage. IMPRESSION: Successful placement of bilateral 10 French percutaneous nephrostomy tubes.  Urine is frankly purulent bilaterally. A sample was obtained and sent for culture. Signed, Criselda Peaches, MD [facility] Radiology Electronically Signed   By: Jacqulynn Cadet [name]   On: 02/23/2017 16:12      No results found for: HGBA1C Lab Results  Component Value Date   CREATININE 13.87 (H) 02/25/2017       Scheduled Meds: . ceFEPime (MAXIPIME) IV  500 mg Intravenous Q24H  . multivitamin with minerals  1 tablet Oral Daily   Continuous Infusions: . sodium chloride 75 mL/hr at 02/24/17 1826  .  sodium bicarbonate (isotonic) infusion in sterile water 125 mL/hr at 02/25/17 0000     LOS: 2 days    Time spent: >30 MINS    [facility]  [facility] Pager [phone]. If 7PM-7AM, please contact night-coverage at [email or web address], password [facility] 02/25/2017, 8:26 AM  LOS: 2 days

## 2017-02-25 NOTE — Progress Notes (Addendum)
3:35 PM LCSW spoke with Harmon Pier regarding referral. No bed available at this time for transfer to [facility] today however per Harmon Pier he CAN transfer tomorrow 3/20 to [facility]. Will update unit SW who will be back tomorrow to continue to assist with disposition.    LCSW aware of consult and following for disposition. Referral made to [facility] as indicated in consult.  Harmon Pier with [facility] will review chart and follow up with disposition.  Lane Hacker, MSW Clinical Social Work: [employer] Coverage for :  [phone]

## 2017-02-25 NOTE — Progress Notes (Signed)
Nutrition Brief Note  Patient identified on the Malnutrition Screening Tool (MST) Report.   Patient with 18% weight loss within the past 6 months. He has had poor oral intake for the past several months, suspect intake has been meeting < 75% of estimated energy requirement for >/= 1 month. Physical exam not completed (patient in a lot of pain).  Patient with severe PCM in the context of chronic illness as evidenced by intake meeting < 75% of estimated energy requirement for >/= 1 month and 18% weight loss within the past 6 months.  Prognosis is poor per review of Palliative Care Team notes. Plans for d/c to [facility] for residential Hospice.  Wt Readings from Last 15 Encounters:  02/25/17 223 lb 1.6 oz (101.2 kg)  12/11/16 236 lb 8 oz (107.3 kg)  10/16/16 257 lb 8 oz (116.8 kg)  10/03/16 256 lb 13.4 oz (116.5 kg)  09/03/16 278 lb 7.1 oz (126.3 kg)  08/16/16 273 lb (123.8 kg)  08/02/16 286 lb 3.2 oz (129.8 kg)  09/16/15 (!) 310 lb (140.6 kg)  04/19/15 290 lb (131.5 kg)  09/16/13 (!) 301 lb 12.8 oz (136.9 kg)  11/06/11 285 lb (129.3 kg)    Body mass index is 27.89 kg/m. Patient meets criteria for overweight based on current BMI.   Current diet order is renal diet, patient is consuming approximately 10-50% of meals at this time. Labs and medications reviewed.   No nutrition interventions indicated at this time given plans for Hospice. If nutrition issues arise, please consult RD.   Molli Barrows, RD, LDN, [place] Pager [phone] After Hours Pager [phone]

## 2017-02-26 DIAGNOSIS — E43 Unspecified severe protein-calorie malnutrition: Secondary | ICD-10-CM

## 2017-02-26 LAB — BASIC METABOLIC PANEL
Anion gap: 17 — ABNORMAL HIGH (ref 5–15)
BUN: 77 mg/dL — AB (ref 6–20)
CALCIUM: 8.2 mg/dL — AB (ref 8.9–10.3)
CO2: 32 mmol/L (ref 22–32)
Chloride: 97 mmol/L — ABNORMAL LOW (ref 101–111)
Creatinine, Ser: 11.06 mg/dL — ABNORMAL HIGH (ref 0.61–1.24)
GFR calc Af Amer: 4 mL/min — ABNORMAL LOW (ref >60)
GFR, EST NON AFRICAN AMERICAN: 4 mL/min — AB (ref >60)
GLUCOSE: 85 mg/dL (ref 65–99)
Potassium: 3.5 mmol/L (ref 3.5–5.1)
Sodium: 146 mmol/L — ABNORMAL HIGH (ref 135–145)

## 2017-02-26 LAB — CBC
HCT: 28.5 % — ABNORMAL LOW (ref 39.0–52.0)
Hemoglobin: 8.8 g/dL — ABNORMAL LOW (ref 13.0–17.0)
MCH: 26.4 pg (ref 26.0–34.0)
MCHC: 30.9 g/dL (ref 30.0–36.0)
MCV: 85.6 fL (ref 78.0–100.0)
PLATELETS: 244 10*3/uL (ref 150–400)
RBC: 3.33 MIL/uL — ABNORMAL LOW (ref 4.22–5.81)
RDW: 16.2 % — AB (ref 11.5–15.5)
WBC: 10 10*3/uL (ref 4.0–10.5)

## 2017-02-26 MED ORDER — CIPROFLOXACIN HCL 250 MG PO TABS: 250.0000 mg | ORAL_TABLET | Freq: Every day | ORAL | 0 refills | Status: AC

## 2017-02-26 MED ORDER — FENTANYL CITRATE (PF) 100 MCG/2ML IJ SOLN: 50.0000 ug | INTRAMUSCULAR | Status: DC | PRN

## 2017-02-26 MED ORDER — FENTANYL 25 MCG/HR TD PT72: 25.0000 ug | MEDICATED_PATCH | TRANSDERMAL | 0 refills | Status: AC

## 2017-02-26 MED ORDER — MORPHINE SULFATE (CONCENTRATE) 10 MG/0.5ML PO SOLN: 10.0000 mg | ORAL | 0 refills | Status: AC | PRN

## 2017-02-26 MED ORDER — ACETAMINOPHEN 325 MG PO TABS: 650.0000 mg | ORAL_TABLET | Freq: Four times a day (QID) | ORAL | Status: DC

## 2017-02-26 MED ORDER — LORAZEPAM 2 MG/ML PO CONC: 1.0000 mg | ORAL | 0 refills | Status: AC | PRN

## 2017-02-26 NOTE — Discharge Summary (Signed)
Physician Discharge Summary  ARUN HERROD MRN: [id number] DOB/AGE: 1931-03-23 83 y.o.  PCP: [name], MD   Admit date: 02/22/2017 Discharge date: 02/26/2017  Discharge Diagnoses:    Principal Problem:   AKI (acute kidney injury) ([place]) Active Problems:   Benign essential HTN   Coronary artery disease involving coronary bypass graft of native heart without angina pectoris   CKD (chronic kidney disease), stage II   Normocytic anemia   Acute lower UTI   Bladder cancer (HCC)   Hematochezia   Prostate CA ([place])   Acidemia   Palliative care encounter   Goals of care, counseling/discussion   Protein-calorie malnutrition, severe    Follow-up recommendations Patient is being discharged at [facility]     Current Discharge Medication List    START taking these medications   Details  ciprofloxacin (CIPRO) 250 MG tablet Take 1 tablet (250 mg total) by mouth daily. Qty: 10 tablet, Refills: 0    fentaNYL (DURAGESIC) 25 MCG/HR patch Place 1 patch (25 mcg total) onto the skin every 3 (three) days. Qty: 5 patch, Refills: 0    LORazepam (LORAZEPAM INTENSOL) 2 MG/ML concentrated solution Take 0.5 mLs (1 mg total) by mouth every 4 (four) hours as needed for anxiety. Qty: 30 mL, Refills: 0    Morphine Sulfate (MORPHINE CONCENTRATE) 10 MG/0.5ML SOLN concentrated solution Take 0.5 mLs (10 mg total) by mouth every 4 (four) hours as needed for severe pain. Qty: 30 mL, Refills: 0      CONTINUE these medications which have NOT CHANGED   Details  Multiple Vitamin (MULTIVITAMIN WITH MINERALS) TABS tablet Take 1 tablet by mouth daily.      STOP taking these medications     rosuvastatin (CRESTOR) 10 MG tablet      traMADol (ULTRAM) 50 MG tablet      aspirin EC 81 MG EC tablet              Allergies  Allergen Reactions  . Gabapentin Other (See Comments)    Shaking  . Aspirin     Causes blood in urine? Per spouse   . Other Other (See Comments)     Antihistamines-unknown reaction      Disposition: [facility]   Consults:  Palliative care Urology Nephrology     Significant Diagnostic Studies:  US Renal  Result Date: 03-20-2017 CLINICAL DATA:  Acute renal failure EXAM: RENAL / URINARY TRACT ULTRASOUND COMPLETE COMPARISON:  Renal ultrasound 09/02/2016 CT abdomen pelvis 07/28/2016 FINDINGS: Right Kidney: Length: 13.2 cm. There is severe right hydroureteronephrosis with the proximal ureter measuring up to 2.5 cm and the distal ureter measuring up 1.9 cm. No calculus is identified. No solid renal mass. Left Kidney: The left kidney could not be visualized, as the patient was lying on his left side and could not be repositioned. Bladder: The urinary bladder is not distended. There is a masslike area of heterogeneous echotexture in the lower pelvis, measuring 9.1 x 7 x 8 cm. IMPRESSION: 1. Severe right hydroureteronephrosis. 2. Masslike area of heterogeneous echotexture at the expected location of the urinary bladder. Further evaluation with CT or MRI of the abdomen and pelvis is recommended. Electronically Signed   By: Ulyses Jarred [name]   On: 03/20/2017 01:57   Ct Renal Stone Study  Result Date: 03-20-2017 CLINICAL DATA:  Patient is dehydrated. No p.o. intake for 2 weeks. No urinary output. Penile infection and bleeding. History of stones. Bladder cancer. Recurrent prostate cancer. EXAM: CT ABDOMEN AND PELVIS WITHOUT  CONTRAST TECHNIQUE: Multidetector CT imaging of the abdomen and pelvis was performed following the standard protocol without IV contrast. COMPARISON:  07/28/2016 FINDINGS: Lower chest: Small bilateral pleural effusions with basilar atelectasis. Fibrosis in the lung bases. Postoperative changes in the mediastinum consistent with bypass grafts. Hepatobiliary: Examination is limited due to motion artifact. No focal liver lesions appreciated. Gallbladder and bile ducts are grossly unremarkable. Pancreas: Unremarkable. No pancreatic  ductal dilatation or surrounding inflammatory changes. Spleen: Normal in size without focal abnormality. Adrenals/Urinary Tract: No adrenal gland nodules. There is prominent bilateral hydronephrosis and hydroureter. Hydronephrosis is progressing since prior study. There is a 9 mm stone in the distal right ureter. On the previous study, this stone was in the kidney. There is a heterogeneous mass involving the bladder with infiltration and a surrounding tissues. The mass may be contributing to bilateral ureteral obstruction. Difficult to separate the bladder from the mass on noncontrast imaging. Altogether, the bladder/mass measures about 6.6 x 9 cm. There is progression since the previous study. Stomach/Bowel: Stomach and small bowel are decompressed. Stool-filled colon without abnormal distention. Diverticulosis of the sigmoid colon with probable muscular hypertrophy. Difficult the separated portion of the sigmoid colon from the bladder mass and involvement of the colon is suspected. Appendix is not identified. Vascular/Lymphatic: Aortic atherosclerosis. No enlarged abdominal or pelvic lymph nodes. Reproductive: Postoperative changes in the prostate gland. Penile prosthesis. Other: Small left inguinal hernia containing fat. No free air or free fluid in the abdomen. Musculoskeletal: Degenerative changes in the spine and hips. No destructive bone lesions. IMPRESSION: Prominent bilateral hydronephrosis and hydroureter. There is a 9 mm stone in the distal right ureter. The bladder wall is irregular with prominent soft tissue, suggesting an infiltrating mass. This may be causing obstruction of the left ureter. The mass is progressing since previous study and now demonstrates likely invasion into the adjacent sigmoid colon. Prostate gland is surgically absent. Penile prosthesis. Small bilateral pleural effusions with atelectasis or consolidation in the lung bases. Aortic atherosclerosis. Small left inguinal hernia  containing fat. Electronically Signed   By: Lucienne Capers [name]   On: 02/23/2017 06:18   Ir Nephrostomy Placement Left  Result Date: 02/23/2017 INDICATION: 83 year old male with acute renal failure and bilateral obstructed pyonephrosis secondary to a primary bladder process, likely bladder cancer. He presents for urgent decompression with bilateral nephrostomy tubes. EXAM: IR NEPHROSTOMY PLACEMENT LEFT; IR NEPHROSTOMY PLACEMENT RIGHT COMPARISON:  CT abdomen/ pelvis 02/23/2017 MEDICATIONS: Patient has already received intravenous Rocephin and cefepime. No additional antibiotics were administered secondary to renal function. ANESTHESIA/SEDATION: Fentanyl 25 mcg IV; Versed 1.5 mg IV Moderate Sedation Time:  20 minutes The patient was continuously monitored during the procedure by the interventional radiology nurse under my direct supervision. CONTRAST:  Approximately 10 mL Isovue 300 - administered into the collecting system(s) FLUOROSCOPY TIME:  Fluoroscopy Time: 2 minutes 30 seconds (27 mGy). COMPLICATIONS: None immediate. TECHNIQUE: The procedure, risks, benefits, and alternatives were explained to the patient. Questions regarding the procedure were encouraged and answered. The patient understands and consents to the procedure. The left flank was prepped with chlorhexidine in a sterile fashion, and a sterile drape was applied covering the operative field. A sterile gown and sterile gloves were used for the procedure. Local anesthesia was provided with 1% Lidocaine. The left flank was interrogated with ultrasound and the left kidney identified. The kidney is hydronephrotic. A suitable access site on the skin overlying the lower pole, posterior calix was identified. After local mg anesthesia was achieved, a small  skin nick was made with an 11 blade scalpel. A 21 gauge Accustick needle was then advanced under direct sonographic guidance into the lower pole of the left kidney. There was return of frankly purulent  urine through the needle hub. A 0.018 inch wire was advanced under fluoroscopic guidance into the left renal collecting system. The Accustick sheath was then advanced over the wire and a 0.018 system exchanged for a 0.035 system. Gentle hand injection of contrast material confirms placement of the sheath within the renal collecting system. There is significant hydronephrosis and irregularity of the collecting system. The tract from the scan into the renal collecting system was then dilated serially to 10-French. A 10-French Cook all-purpose drain was then placed and positioned under fluoroscopic guidance. The locking loop is well formed within the left renal pelvis. The catheter was secured to the skin with 2-0 Prolene and a sterile bandage was placed. Catheter was left to gravity bag drainage. Attention was next turned to the right kidney. The right flank was prepped with chlorhexidine in a sterile fashion, and a sterile drape was applied covering the operative field. A sterile gown and sterile gloves were used for the procedure. Local anesthesia was provided with 1% Lidocaine. The right flank was interrogated with ultrasound and the left kidney identified. The kidney is hydronephrotic. A suitable access site on the skin overlying the lower pole, posterior calix was identified. After local mg anesthesia was achieved, a small skin nick was made with an 11 blade scalpel. A 21 gauge Accustick needle was then advanced under direct sonographic guidance into the lower pole of the right kidney. There was return of frankly purulent urine. A sample was aspirated and sent for culture. A 0.018 inch wire was advanced under fluoroscopic guidance into the left renal collecting system. The Accustick sheath was then advanced over the wire and a 0.018 system exchanged for a 0.035 system. Gentle hand injection of contrast material confirms placement of the sheath within the renal collecting system. There is significant  hydronephrosis and irregularity of the collecting system. The tract from the scan into the renal collecting system was then dilated serially to 10-French. A 10-French Cook all-purpose drain was then placed and positioned under fluoroscopic guidance. The locking loop is well formed within the left renal pelvis. The catheter was secured to the skin with 2-0 Prolene and a sterile bandage was placed. Catheter was left to gravity bag drainage. IMPRESSION: Successful placement of bilateral 10 French percutaneous nephrostomy tubes. Urine is frankly purulent bilaterally. A sample was obtained and sent for culture. Signed, Criselda Peaches, MD [facility] Radiology Electronically Signed   By: Jacqulynn Cadet [name]   On: 02/23/2017 16:12   Ir Nephrostomy Placement Right  Result Date: 02/23/2017 INDICATION: 83 year old male with acute renal failure and bilateral obstructed pyonephrosis secondary to a primary bladder process, likely bladder cancer. He presents for urgent decompression with bilateral nephrostomy tubes. EXAM: IR NEPHROSTOMY PLACEMENT LEFT; IR NEPHROSTOMY PLACEMENT RIGHT COMPARISON:  CT abdomen/ pelvis 02/23/2017 MEDICATIONS: Patient has already received intravenous Rocephin and cefepime. No additional antibiotics were administered secondary to renal function. ANESTHESIA/SEDATION: Fentanyl 25 mcg IV; Versed 1.5 mg IV Moderate Sedation Time:  20 minutes The patient was continuously monitored during the procedure by the interventional radiology nurse under my direct supervision. CONTRAST:  Approximately 10 mL Isovue 300 - administered into the collecting system(s) FLUOROSCOPY TIME:  Fluoroscopy Time: 2 minutes 30 seconds (27 mGy). COMPLICATIONS: None immediate. TECHNIQUE: The procedure, risks,  benefits, and alternatives were explained to the patient. Questions regarding the procedure were encouraged and answered. The patient understands and consents to the  procedure. The left flank was prepped with chlorhexidine in a sterile fashion, and a sterile drape was applied covering the operative field. A sterile gown and sterile gloves were used for the procedure. Local anesthesia was provided with 1% Lidocaine. The left flank was interrogated with ultrasound and the left kidney identified. The kidney is hydronephrotic. A suitable access site on the skin overlying the lower pole, posterior calix was identified. After local mg anesthesia was achieved, a small skin nick was made with an 11 blade scalpel. A 21 gauge Accustick needle was then advanced under direct sonographic guidance into the lower pole of the left kidney. There was return of frankly purulent urine through the needle hub. A 0.018 inch wire was advanced under fluoroscopic guidance into the left renal collecting system. The Accustick sheath was then advanced over the wire and a 0.018 system exchanged for a 0.035 system. Gentle hand injection of contrast material confirms placement of the sheath within the renal collecting system. There is significant hydronephrosis and irregularity of the collecting system. The tract from the scan into the renal collecting system was then dilated serially to 10-French. A 10-French Cook all-purpose drain was then placed and positioned under fluoroscopic guidance. The locking loop is well formed within the left renal pelvis. The catheter was secured to the skin with 2-0 Prolene and a sterile bandage was placed. Catheter was left to gravity bag drainage. Attention was next turned to the right kidney. The right flank was prepped with chlorhexidine in a sterile fashion, and a sterile drape was applied covering the operative field. A sterile gown and sterile gloves were used for the procedure. Local anesthesia was provided with 1% Lidocaine. The right flank was interrogated with ultrasound and the left kidney identified. The kidney is hydronephrotic. A suitable access site on the skin  overlying the lower pole, posterior calix was identified. After local mg anesthesia was achieved, a small skin nick was made with an 11 blade scalpel. A 21 gauge Accustick needle was then advanced under direct sonographic guidance into the lower pole of the right kidney. There was return of frankly purulent urine. A sample was aspirated and sent for culture. A 0.018 inch wire was advanced under fluoroscopic guidance into the left renal collecting system. The Accustick sheath was then advanced over the wire and a 0.018 system exchanged for a 0.035 system. Gentle hand injection of contrast material confirms placement of the sheath within the renal collecting system. There is significant hydronephrosis and irregularity of the collecting system. The tract from the scan into the renal collecting system was then dilated serially to 10-French. A 10-French Cook all-purpose drain was then placed and positioned under fluoroscopic guidance. The locking loop is well formed within the left renal pelvis. The catheter was secured to the skin with 2-0 Prolene and a sterile bandage was placed. Catheter was left to gravity bag drainage. IMPRESSION: Successful placement of bilateral 10 French percutaneous nephrostomy tubes. Urine is frankly purulent bilaterally. A sample was obtained and sent for culture. Signed, Criselda Peaches, MD [facility] Radiology Electronically Signed   By: Jacqulynn Cadet [name]   On: 02/23/2017 16:12        Filed Weights   02/24/17 0300 02/25/17 0424 02/26/17 0406  Weight: 102 kg (224 lb 13.9 oz) 101.2 kg (223 lb 1.6 oz) 98.2 kg (  216 lb 7.9 oz)     Microbiology: Recent Results (from the past 240 hour(s))  Urine culture     Status: Abnormal   Collection Time: 02/23/17 12:47 AM  Result Value Ref Range Status   Specimen Description URINE, CLEAN CATCH  Final   Special Requests NONE  Final   Culture (A)  Final    >=100,000 COLONIES/mL  DIPHTHEROIDS(CORYNEBACTERIUM SPECIES) Standardized susceptibility testing for this organism is not available. Performed at [facility], [place] [address] [place], [place] [address]    Report Status 02/24/2017 FINAL  Final  MRSA PCR Screening     Status: None   Collection Time: 02/23/17  6:44 AM  Result Value Ref Range Status   MRSA by PCR NEGATIVE NEGATIVE Final    Comment:        The GeneXpert MRSA Assay (FDA approved for NASAL specimens only), is one component of a comprehensive MRSA colonization surveillance program. It is not intended to diagnose MRSA infection nor to guide or monitor treatment for MRSA infections.   Urine culture     Status: Abnormal   Collection Time: 02/23/17  3:56 PM  Result Value Ref Range Status   Specimen Description KIDNEY RIGHT  Final   Special Requests NONE  Final   Culture 60,000 COLONIES/mL PSEUDOMONAS AERUGINOSA (A)  Final   Report Status 02/25/2017 FINAL  Final   Organism ID, Bacteria PSEUDOMONAS AERUGINOSA (A)  Final      Susceptibility   Pseudomonas aeruginosa - MIC*    CEFTAZIDIME 2 SENSITIVE Sensitive     CIPROFLOXACIN 0.5 SENSITIVE Sensitive     GENTAMICIN <=1 SENSITIVE Sensitive     IMIPENEM 1 SENSITIVE Sensitive     PIP/TAZO <=4 SENSITIVE Sensitive     CEFEPIME <=1 SENSITIVE Sensitive     * 60,000 COLONIES/mL PSEUDOMONAS AERUGINOSA       Blood Culture    Component Value Date/Time   SDES KIDNEY RIGHT 02/23/2017 1556   SPECREQUEST NONE 02/23/2017 1556   CULT 60,000 COLONIES/mL PSEUDOMONAS AERUGINOSA (A) 02/23/2017 1556   REPTSTATUS 02/25/2017 FINAL 02/23/2017 1556      Labs: Results for orders placed or performed during the hospital encounter of 02/22/17 (from the past 48 hour(s))  CBC     Status: Abnormal   Collection Time: 02/25/17  3:20 AM  Result Value Ref Range   WBC 10.0 4.0 - 10.5 K/uL   RBC 2.88 (L) 4.22 - 5.81 MIL/uL   Hemoglobin 7.8 (L) 13.0 - 17.0 g/dL   HCT 23.6 (L) 39.0 - 52.0 %   MCV 81.9 78.0 -  100.0 fL   MCH 27.1 26.0 - 34.0 pg   MCHC 33.1 30.0 - 36.0 g/dL   RDW 16.0 (H) 11.5 - 15.5 %   Platelets 211 150 - 400 K/uL  Comprehensive metabolic panel     Status: Abnormal   Collection Time: 02/25/17  3:20 AM  Result Value Ref Range   Sodium 144 135 - 145 mmol/L   Potassium 3.9 3.5 - 5.1 mmol/L    Comment: DELTA CHECK NOTED   Chloride 99 (L) 101 - 111 mmol/L   CO2 27 22 - 32 mmol/L   Glucose, Bld 105 (H) 65 - 99 mg/dL   BUN 94 (H) 6 - 20 mg/dL   Creatinine, Ser 13.87 (H) 0.61 - 1.24 mg/dL   Calcium 8.0 (L) 8.9 - 10.3 mg/dL   Total Protein 5.8 (L) 6.5 - 8.1 g/dL   Albumin 1.7 (L) 3.5 - 5.0 g/dL  AST 16 15 - 41 U/L   ALT 10 (L) 17 - 63 U/L   Alkaline Phosphatase 48 38 - 126 U/L   Total Bilirubin 0.4 0.3 - 1.2 mg/dL   GFR calc non Af Amer 3 (L) >60 mL/min   GFR calc Af Amer 3 (L) >60 mL/min    Comment: (NOTE) The eGFR has been calculated using the CKD EPI equation. This calculation has not been validated in all clinical situations. eGFR's persistently <60 mL/min signify possible Chronic Kidney Disease.    Anion gap 18 (H) 5 - 15  Glucose, capillary     Status: None   Collection Time: 02/25/17  7:30 AM  Result Value Ref Range   Glucose-Capillary 98 65 - 99 mg/dL  CBC     Status: Abnormal   Collection Time: 02/26/17  4:51 AM  Result Value Ref Range   WBC 10.0 4.0 - 10.5 K/uL   RBC 3.33 (L) 4.22 - 5.81 MIL/uL   Hemoglobin 8.8 (L) 13.0 - 17.0 g/dL   HCT 28.5 (L) 39.0 - 52.0 %   MCV 85.6 78.0 - 100.0 fL   MCH 26.4 26.0 - 34.0 pg   MCHC 30.9 30.0 - 36.0 g/dL   RDW 16.2 (H) 11.5 - 15.5 %   Platelets 244 150 - 400 K/uL  Basic metabolic panel     Status: Abnormal   Collection Time: 02/26/17  4:51 AM  Result Value Ref Range   Sodium 146 (H) 135 - 145 mmol/L   Potassium 3.5 3.5 - 5.1 mmol/L    Comment: SLIGHT HEMOLYSIS   Chloride 97 (L) 101 - 111 mmol/L   CO2 32 22 - 32 mmol/L   Glucose, Bld 85 65 - 99 mg/dL   BUN 77 (H) 6 - 20 mg/dL   Creatinine, Ser 11.06 (H) 0.61 -  1.24 mg/dL   Calcium 8.2 (L) 8.9 - 10.3 mg/dL   GFR calc non Af Amer 4 (L) >60 mL/min   GFR calc Af Amer 4 (L) >60 mL/min    Comment: (NOTE) The eGFR has been calculated using the CKD EPI equation. This calculation has not been validated in all clinical situations. eGFR's persistently <60 mL/min signify possible Chronic Kidney Disease.    Anion gap 17 (H) 5 - 15       HPI :  [name] with and extensive PMH significant for HTN, CAD s/p CABG, prostate cancer s/p multiple TURBT with recurrent UTI's, as well as bladder cancer (receives Urologic care through the [place] [facility] and has had "scraping of the bladder" several times) who was brought to [facility] with a 2 week history of malaise, anorexia, and FTT. His wife reports that he had diarrhea for the last week and also had some blood in his stools.Labs upon arrival were notable for a potassium of 6.8 and a BUN/Cr of 121 / 17.69. Previously creatinine was 1.73.  nephrology consulted to further evaluate and manage his AKI/CKD and hyperkalemia.  urology consulted for Foley placement. Patient is followed by Caren Macadam, MD , urologist at [facility]:   AKI/CKD in the setting of  bladder outlet obstruction-, bladder cancer, prostate cancer  in setting of volume depletion and possible bladder outlet obstruction in the setting of prostate and bladder cancer. Urology consulted for Foley placement but was unable. Multiple attempts were made to place a foley catheter which were unsuccessful due to the large bladder mass. patient declined cystoscopy in November of last year.   Marland Kitchen  status post IR placement of  bilateral perc nephrostomy. He is not a candidate for long-term hemodialysis. Improvement in creatinine from 17-11 after nephrostomy tube placement. However long-term prognosis remains poor. Patient is being discharged at [facility]  Hyperkalemia- due to #1 and metabolic acidosis. Treated with IV calcium, insulin/D50 and now  on bicarb drip. Patient did not have EKG changes consistent with Hyperkalemia. Received several doses of Kayexalate 3/17. Hyperkalemia Resolved after IR placedbilateral PNT"s.    Pyuria/hematuria- urine culture positive for Pseudomonas, sensitivity to ciprofloxacin, patient switched to ciprofloxacin by mouth for 10 days  Bladder cancer,Prostate cancer-followed at [facility] , urology consulted this admission  Anemia- normocytic with h/o bladder and prostate cancer as well as ckd stage 2-3 and gross hematuria and hematochezia. Hemoglobin at baseline around 9.5, hemoglobin dropped to 8.8 today. CT abdomen shows bilateral hydronephrosis and hydroureter, likely causing abdominal pain. Bladder mass is progressing, likely invasion into the adjacent sigmoid colon. This is likely the cause of blood in the stool.   Goals of care Long discussion with pt, wife, and daughter today regarding his health course and options.   explained the infiltrating bladder mass, emphasizing my concern for ongoing pain, high risk for bleeding, and recurrent dehydration issues. We also talked about his functional status and tolerance of invasive procedures. Mr. Turvey was focused on controlling his pain. In light of his pain status, high risk for bleeding, poor oral intake and failure to thirve in the setting of cancer, we recommended residential hospice. Family agreeable to proceed.   Discharge Exam:   Blood pressure 136/79, pulse 95, temperature 98.7 F (37.1 C), temperature source Oral, resp. rate 20, height _0  (1.905 m), weight 98.2 kg (216 lb 7.9 oz), SpO2 91 %.  Cardiovascular: Normal rate.   Pulmonary/Chest: Effort normal.  Genitourinary:  Genitourinary Comments: Bilateral nephrostomy tubes, urine noted in bags  Neurological: He is alert.  Short-term memory deficits noted, confused on situation  Skin: Skin is warm and dry.  Psychiatric: He has a normal mood and affect. Judgment and thought content  normal. His speech is delayed. He is slowed. He exhibits abnormal recent memory.       SignedReyne Dumas 02/26/2017, 9:43 AM        Time spent >45 mins

## 2017-02-26 NOTE — Clinical Social Work Note (Signed)
Patient discharged to [facility] today, transported by ambulance. Family member in room advised that transport called.   Roby Donaway Givens, MSW, LCSW Licensed Clinical Social Worker [place] [phone]

## 2017-02-26 NOTE — Progress Notes (Signed)
OT Cancellation    02/26/17 1200  OT Visit Information  Last OT Received On 02/26/17  Reason Eval/Treat Not Completed OT screened, no needs identified, will sign off. (Pt and family have decided to move to comfort care. Will sign off to respect family wishes.)   Loretha Brasil, OTR/L [phone]

## 2017-02-26 NOTE — Progress Notes (Signed)
Patient ID: [name], male   DOB: [birth year], 83 y.o.   MRN: [id number]    Referring Physician(s): Dr. Nicolette Bang  Supervising Physician: Markus Daft  Patient Status: [facility] - In-pt  Chief Complaint: Bilateral hydronephrosis  Subjective: Patient laying in bed comfortably.  No new complaints  Allergies: Gabapentin; Aspirin; and Other  Medications: Prior to Admission medications   Medication Sig Start Date End Date Taking? Authorizing Provider  Multiple Vitamin (MULTIVITAMIN WITH MINERALS) TABS tablet Take 1 tablet by mouth daily.   Yes Historical Provider, MD  rosuvastatin (CRESTOR) 10 MG tablet Take 10 mg by mouth daily. pw 08/11/16  Yes Historical Provider, MD  traMADol (ULTRAM) 50 MG tablet Take 1 tablet (50 mg total) by mouth every 6 (six) hours as needed for moderate pain. 08/16/16  Yes Daniel J Angiulli, PA-C  aspirin EC 81 MG EC tablet Take 1 tablet (81 mg total) by mouth daily. Patient not taking: Reported on 02/22/2017 10/04/16   Clanford Marisa Hua, MD  ciprofloxacin (CIPRO) 250 MG tablet Take 1 tablet (250 mg total) by mouth daily. 02/26/17 03/08/17  Reyne Dumas, MD  fentaNYL (DURAGESIC) 25 MCG/HR patch Place 1 patch (25 mcg total) onto the skin every 3 (three) days. 02/26/17   Reyne Dumas, MD  LORazepam (LORAZEPAM INTENSOL) 2 MG/ML concentrated solution Take 0.5 mLs (1 mg total) by mouth every 4 (four) hours as needed for anxiety. 02/26/17   Reyne Dumas, MD  Morphine Sulfate (MORPHINE CONCENTRATE) 10 MG/0.5ML SOLN concentrated solution Take 0.5 mLs (10 mg total) by mouth every 4 (four) hours as needed for severe pain. 02/26/17   Reyne Dumas, MD    Vital Signs: BP 138/88 (BP Location: Right Arm)   Pulse 89   Temp 97.8 F (36.6 C) (Oral)   Resp 20   Ht 6\' 3"  (1.905 m)   Wt 216 lb 7.9 oz (98.2 kg)   SpO2 92%   BMI 27.06 kg/m   Physical Exam: abd: bilateral flanks with PCNs in place.  Right continues to drain very well with 4500cc out yesterday.  Left with 250cc  out yesterday.  Both sides are clear urine today. Drain sites are c/d/i  Imaging: US Renal  Result Date: 02/23/2017 CLINICAL DATA:  Acute renal failure EXAM: RENAL / URINARY TRACT ULTRASOUND COMPLETE COMPARISON:  Renal ultrasound 09/02/2016 CT abdomen pelvis 07/28/2016 FINDINGS: Right Kidney: Length: 13.2 cm. There is severe right hydroureteronephrosis with the proximal ureter measuring up to 2.5 cm and the distal ureter measuring up 1.9 cm. No calculus is identified. No solid renal mass. Left Kidney: The left kidney could not be visualized, as the patient was lying on his left side and could not be repositioned. Bladder: The urinary bladder is not distended. There is a masslike area of heterogeneous echotexture in the lower pelvis, measuring 9.1 x 7 x 8 cm. IMPRESSION: 1. Severe right hydroureteronephrosis. 2. Masslike area of heterogeneous echotexture at the expected location of the urinary bladder. Further evaluation with CT or MRI of the abdomen and pelvis is recommended. Electronically Signed   By: Ulyses Jarred [name]   On: 02/23/2017 01:57   Ct Renal Stone Study  Result Date: 02/23/2017 CLINICAL DATA:  Patient is dehydrated. No p.o. intake for 2 weeks. No urinary output. Penile infection and bleeding. History of stones. Bladder cancer. Recurrent prostate cancer. EXAM: CT ABDOMEN AND PELVIS WITHOUT CONTRAST TECHNIQUE: Multidetector CT imaging of the abdomen and pelvis was performed following the standard protocol without IV contrast. COMPARISON:  07/28/2016 FINDINGS:  Lower chest: Small bilateral pleural effusions with basilar atelectasis. Fibrosis in the lung bases. Postoperative changes in the mediastinum consistent with bypass grafts. Hepatobiliary: Examination is limited due to motion artifact. No focal liver lesions appreciated. Gallbladder and bile ducts are grossly unremarkable. Pancreas: Unremarkable. No pancreatic ductal dilatation or surrounding inflammatory changes. Spleen: Normal in size  without focal abnormality. Adrenals/Urinary Tract: No adrenal gland nodules. There is prominent bilateral hydronephrosis and hydroureter. Hydronephrosis is progressing since prior study. There is a 9 mm stone in the distal right ureter. On the previous study, this stone was in the kidney. There is a heterogeneous mass involving the bladder with infiltration and a surrounding tissues. The mass may be contributing to bilateral ureteral obstruction. Difficult to separate the bladder from the mass on noncontrast imaging. Altogether, the bladder/mass measures about 6.6 x 9 cm. There is progression since the previous study. Stomach/Bowel: Stomach and small bowel are decompressed. Stool-filled colon without abnormal distention. Diverticulosis of the sigmoid colon with probable muscular hypertrophy. Difficult the separated portion of the sigmoid colon from the bladder mass and involvement of the colon is suspected. Appendix is not identified. Vascular/Lymphatic: Aortic atherosclerosis. No enlarged abdominal or pelvic lymph nodes. Reproductive: Postoperative changes in the prostate gland. Penile prosthesis. Other: Small left inguinal hernia containing fat. No free air or free fluid in the abdomen. Musculoskeletal: Degenerative changes in the spine and hips. No destructive bone lesions. IMPRESSION: Prominent bilateral hydronephrosis and hydroureter. There is a 9 mm stone in the distal right ureter. The bladder wall is irregular with prominent soft tissue, suggesting an infiltrating mass. This may be causing obstruction of the left ureter. The mass is progressing since previous study and now demonstrates likely invasion into the adjacent sigmoid colon. Prostate gland is surgically absent. Penile prosthesis. Small bilateral pleural effusions with atelectasis or consolidation in the lung bases. Aortic atherosclerosis. Small left inguinal hernia containing fat. Electronically Signed   By: Lucienne Capers [name]   On: 02/23/2017  06:18   Ir Nephrostomy Placement Left  Result Date: 02/23/2017 INDICATION: 83 year old male with acute renal failure and bilateral obstructed pyonephrosis secondary to a primary bladder process, likely bladder cancer. He presents for urgent decompression with bilateral nephrostomy tubes. EXAM: IR NEPHROSTOMY PLACEMENT LEFT; IR NEPHROSTOMY PLACEMENT RIGHT COMPARISON:  CT abdomen/ pelvis 02/23/2017 MEDICATIONS: Patient has already received intravenous Rocephin and cefepime. No additional antibiotics were administered secondary to renal function. ANESTHESIA/SEDATION: Fentanyl 25 mcg IV; Versed 1.5 mg IV Moderate Sedation Time:  20 minutes The patient was continuously monitored during the procedure by the interventional radiology nurse under my direct supervision. CONTRAST:  Approximately 10 mL Isovue 300 - administered into the collecting system(s) FLUOROSCOPY TIME:  Fluoroscopy Time: 2 minutes 30 seconds (27 mGy). COMPLICATIONS: None immediate. TECHNIQUE: The procedure, risks, benefits, and alternatives were explained to the patient. Questions regarding the procedure were encouraged and answered. The patient understands and consents to the procedure. The left flank was prepped with chlorhexidine in a sterile fashion, and a sterile drape was applied covering the operative field. A sterile gown and sterile gloves were used for the procedure. Local anesthesia was provided with 1% Lidocaine. The left flank was interrogated with ultrasound and the left kidney identified. The kidney is hydronephrotic. A suitable access site on the skin overlying the lower pole, posterior calix was identified. After local mg anesthesia was achieved, a small skin nick was made with an 11 blade scalpel. A 21 gauge Accustick needle was then advanced under direct sonographic guidance into the  lower pole of the left kidney. There was return of frankly purulent urine through the needle hub. A 0.018 inch wire was advanced under fluoroscopic  guidance into the left renal collecting system. The Accustick sheath was then advanced over the wire and a 0.018 system exchanged for a 0.035 system. Gentle hand injection of contrast material confirms placement of the sheath within the renal collecting system. There is significant hydronephrosis and irregularity of the collecting system. The tract from the scan into the renal collecting system was then dilated serially to 10-French. A 10-French Cook all-purpose drain was then placed and positioned under fluoroscopic guidance. The locking loop is well formed within the left renal pelvis. The catheter was secured to the skin with 2-0 Prolene and a sterile bandage was placed. Catheter was left to gravity bag drainage. Attention was next turned to the right kidney. The right flank was prepped with chlorhexidine in a sterile fashion, and a sterile drape was applied covering the operative field. A sterile gown and sterile gloves were used for the procedure. Local anesthesia was provided with 1% Lidocaine. The right flank was interrogated with ultrasound and the left kidney identified. The kidney is hydronephrotic. A suitable access site on the skin overlying the lower pole, posterior calix was identified. After local mg anesthesia was achieved, a small skin nick was made with an 11 blade scalpel. A 21 gauge Accustick needle was then advanced under direct sonographic guidance into the lower pole of the right kidney. There was return of frankly purulent urine. A sample was aspirated and sent for culture. A 0.018 inch wire was advanced under fluoroscopic guidance into the left renal collecting system. The Accustick sheath was then advanced over the wire and a 0.018 system exchanged for a 0.035 system. Gentle hand injection of contrast material confirms placement of the sheath within the renal collecting system. There is significant hydronephrosis and irregularity of the collecting system. The tract from the scan into the  renal collecting system was then dilated serially to 10-French. A 10-French Cook all-purpose drain was then placed and positioned under fluoroscopic guidance. The locking loop is well formed within the left renal pelvis. The catheter was secured to the skin with 2-0 Prolene and a sterile bandage was placed. Catheter was left to gravity bag drainage. IMPRESSION: Successful placement of bilateral 10 French percutaneous nephrostomy tubes. Urine is frankly purulent bilaterally. A sample was obtained and sent for culture. Signed, Criselda Peaches, MD [facility] Radiology Electronically Signed   By: Jacqulynn Cadet [name]   On: 02/23/2017 16:12   Ir Nephrostomy Placement Right  Result Date: 02/23/2017 INDICATION: 83 year old male with acute renal failure and bilateral obstructed pyonephrosis secondary to a primary bladder process, likely bladder cancer. He presents for urgent decompression with bilateral nephrostomy tubes. EXAM: IR NEPHROSTOMY PLACEMENT LEFT; IR NEPHROSTOMY PLACEMENT RIGHT COMPARISON:  CT abdomen/ pelvis 02/23/2017 MEDICATIONS: Patient has already received intravenous Rocephin and cefepime. No additional antibiotics were administered secondary to renal function. ANESTHESIA/SEDATION: Fentanyl 25 mcg IV; Versed 1.5 mg IV Moderate Sedation Time:  20 minutes The patient was continuously monitored during the procedure by the interventional radiology nurse under my direct supervision. CONTRAST:  Approximately 10 mL Isovue 300 - administered into the collecting system(s) FLUOROSCOPY TIME:  Fluoroscopy Time: 2 minutes 30 seconds (27 mGy). COMPLICATIONS: None immediate. TECHNIQUE: The procedure, risks, benefits, and alternatives were explained to the patient. Questions regarding the procedure were encouraged and answered. The patient understands and consents to the  procedure. The left flank was prepped with chlorhexidine in a sterile fashion, and a sterile  drape was applied covering the operative field. A sterile gown and sterile gloves were used for the procedure. Local anesthesia was provided with 1% Lidocaine. The left flank was interrogated with ultrasound and the left kidney identified. The kidney is hydronephrotic. A suitable access site on the skin overlying the lower pole, posterior calix was identified. After local mg anesthesia was achieved, a small skin nick was made with an 11 blade scalpel. A 21 gauge Accustick needle was then advanced under direct sonographic guidance into the lower pole of the left kidney. There was return of frankly purulent urine through the needle hub. A 0.018 inch wire was advanced under fluoroscopic guidance into the left renal collecting system. The Accustick sheath was then advanced over the wire and a 0.018 system exchanged for a 0.035 system. Gentle hand injection of contrast material confirms placement of the sheath within the renal collecting system. There is significant hydronephrosis and irregularity of the collecting system. The tract from the scan into the renal collecting system was then dilated serially to 10-French. A 10-French Cook all-purpose drain was then placed and positioned under fluoroscopic guidance. The locking loop is well formed within the left renal pelvis. The catheter was secured to the skin with 2-0 Prolene and a sterile bandage was placed. Catheter was left to gravity bag drainage. Attention was next turned to the right kidney. The right flank was prepped with chlorhexidine in a sterile fashion, and a sterile drape was applied covering the operative field. A sterile gown and sterile gloves were used for the procedure. Local anesthesia was provided with 1% Lidocaine. The right flank was interrogated with ultrasound and the left kidney identified. The kidney is hydronephrotic. A suitable access site on the skin overlying the lower pole, posterior calix was identified. After local mg anesthesia was  achieved, a small skin nick was made with an 11 blade scalpel. A 21 gauge Accustick needle was then advanced under direct sonographic guidance into the lower pole of the right kidney. There was return of frankly purulent urine. A sample was aspirated and sent for culture. A 0.018 inch wire was advanced under fluoroscopic guidance into the left renal collecting system. The Accustick sheath was then advanced over the wire and a 0.018 system exchanged for a 0.035 system. Gentle hand injection of contrast material confirms placement of the sheath within the renal collecting system. There is significant hydronephrosis and irregularity of the collecting system. The tract from the scan into the renal collecting system was then dilated serially to 10-French. A 10-French Cook all-purpose drain was then placed and positioned under fluoroscopic guidance. The locking loop is well formed within the left renal pelvis. The catheter was secured to the skin with 2-0 Prolene and a sterile bandage was placed. Catheter was left to gravity bag drainage. IMPRESSION: Successful placement of bilateral 10 French percutaneous nephrostomy tubes. Urine is frankly purulent bilaterally. A sample was obtained and sent for culture. Signed, Criselda Peaches, MD [facility] Radiology Electronically Signed   By: Jacqulynn Cadet [name]   On: 02/23/2017 16:12    Labs:  CBC:  Recent Labs  02/23/17 0450 02/24/17 0657 02/25/17 0320 02/26/17 0451  WBC 14.8* 11.2* 10.0 10.0  HGB 8.4* 7.8* 7.8* 8.8*  HCT 24.8* 23.4* 23.6* 28.5*  PLT 303 240 211 244    COAGS:  Recent Labs  10/01/16 1948 02/23/17 1420  INR  1.48 1.55  APTT 31  --     BMP:  Recent Labs  02/23/17 1150 02/24/17 0657 02/25/17 0320 02/26/17 0451  NA 136 139  139 144 146*  K 7.0* 4.9  4.8 3.9 3.5  CL 105 100*  103 99* 97*  CO2 12* 20*  19* 27 32  GLUCOSE 85 86  88 105* 85  BUN 122* 111*  110* 94* 77*    CALCIUM 8.6* 8.3*  8.2* 8.0* 8.2*  CREATININE 16.65* 16.10*  15.76* 13.87* 11.06*  GFRNONAA 2* 2*  2* 3* 4*  GFRAA 3* 3*  3* 3* 4*    LIVER FUNCTION TESTS:  Recent Labs  10/01/16 1618 12/08/16 1754  02/23/17 0450 02/23/17 1150 02/24/17 0657 02/25/17 0320  BILITOT 0.4 0.5  --   --   --  0.6 0.4  AST 55* 18  --   --   --  14* 16  ALT 35 10*  --   --   --  9* 10*  ALKPHOS 62 48  --   --   --  43 48  PROT 8.1 7.6  --   --   --  5.8* 5.8*  ALBUMIN 2.7* 2.7*  < > 2.3* 1.8* 1.7*  1.7* 1.7*  < > = values in this interval not displayed.  Assessment and Plan: 1. s/p bilateral PCN placements  -patient being discharged to [facility] today for hospice care -cont routine drain care with flushes daily to each drain with 5-10cc of NS -patient prognosis is poor and so should not need IR follow up, unless he is still living 6-8 weeks from now, then he would need drain exchanges if felt appropriate at that time.  Electronically Signed: Henreitta Cea 02/26/2017, 11:54 AM   I spent a total of 15 Minutes at the the patient's bedside AND on the patient's hospital floor or unit, greater than 50% of which was counseling/coordinating care for bilateral hydronephrosis

## 2017-02-26 NOTE — Progress Notes (Signed)
Report given to [facility] of [facility].

## 2017-02-26 NOTE — Progress Notes (Signed)
PT Cancellation Note  Patient Details Name: [name] MRN: [id number] DOB: 09-03-1931   Cancelled Treatment:    Reason Eval/Treat Not Completed: PT screened, no needs identified, will sign off.  Pt and family have decided to move toward comfort care with transfer to residential Hospice care.  Will signoff at this time in light of their wishes.  Thank you. 02/26/2017  Donnella Sham, PT [phone]  (pager)   [name] 02/26/2017, 10:53 AM

## 2017-02-26 NOTE — Progress Notes (Signed)
Daily Progress Note   Patient Name: DMARIO RUSSOM       Date: 02/26/2017 DOB: Jan 03, 1931  Age: 83 y.o. MRN#: [id number] Attending Physician: Reyne Dumas, MD Primary Care Physician: [name], MD Admit Date: 02/22/2017  Reason for Consultation/Follow-up: Pain control, Psychosocial/spiritual support and Terminal Care  Subjective: Mr. Morozov was slightly more lethargic today. He recalled meeting me yesterday, and the content of our conversation. He expressed surprise that he was still alive this morning (see discussion below). He does feel his pain is under good control, with only slight tenderness around percutaneous nephrostomy tube sites. He slept well this morning. No appetite, with only a few sips of water for breakfast.  Length of Stay: 3  Current Medications: Scheduled Meds:  . acetaminophen  650 mg Oral Q6H  . ciprofloxacin  250 mg Oral Daily  . mouth rinse  15 mL Mouth Rinse BID  . multivitamin with minerals  1 tablet Oral Daily    Continuous Infusions: . sodium chloride 75 mL/hr at 02/26/17 0204    PRN Meds: fentaNYL (SUBLIMAZE) injection, LORazepam, traMADol  Physical Exam  Constitutional: He appears lethargic. No distress.  Fat loss with muscle wasting, appears malnurished  HENT:  Head: Normocephalic and atraumatic.  Mouth/Throat: Oropharynx is clear and moist. No oropharyngeal exudate.  Eyes: EOM are normal.  Neck: Normal range of motion. Neck supple.  Cardiovascular: Normal rate.   Pulmonary/Chest: Effort normal.  Abdominal: Soft. Bowel sounds are normal.  Genitourinary:  Genitourinary Comments: Bilateral nephrostomy tubes, urine noted in bags  Musculoskeletal: He exhibits edema (1+ BLE).  Neurological: He appears lethargic.  Short-term memory deficits  noted. Slight problem processing information  Skin: Skin is warm and dry.  Bilateral percutaneous nephrostomy tubes, draining.  Psychiatric: He has a normal mood and affect. Judgment and thought content normal. His speech is delayed. He is slowed. He exhibits abnormal recent memory.  Nursing note and vitals reviewed.           Vital Signs: BP 136/79 (BP Location: Right Arm)   Pulse 95   Temp 98.7 F (37.1 C) (Oral)   Resp 20   Ht 6' 3"  (1.905 m)   Wt 98.2 kg (216 lb 7.9 oz)   SpO2 91%   BMI 27.06 kg/m  SpO2: SpO2: 91 % O2 Device: O2 Device: Not  Delivered O2 Flow Rate: O2 Flow Rate (L/min): 1.5 L/min  Intake/output summary:   Intake/Output Summary (Last 24 hours) at 02/26/17 0945 Last data filed at 02/26/17 0700  Gross per 24 hour  Intake          3360.01 ml  Output             5750 ml  Net         -2389.99 ml   LBM: Last BM Date: 02/24/17 Baseline Weight: Weight: 107 kg (235 lb 14.3 oz) Most recent weight: Weight: 98.2 kg (216 lb 7.9 oz)       Palliative Assessment/Data: PPS 40%   Flowsheet Rows     Most Recent Value  Intake Tab  Referral Department  Hospitalist  Unit at Time of Referral  Intermediate Care Unit  Palliative Care Primary Diagnosis  Cancer  Date Notified  02/23/17  Palliative Care Type  New Palliative care  Reason for referral  Clarify Goals of Care  Date of Admission  02/23/17  Date first seen by Palliative Care  02/23/17  # of days Palliative referral response time  0 Day(s)  # of days IP prior to Palliative referral  0  Clinical Assessment  Palliative Performance Scale Score  20%  Pain Max last 24 hours  Not able to report  Pain Min Last 24 hours  Not able to report  Dyspnea Max Last 24 Hours  Not able to report  Dyspnea Min Last 24 hours  Not able to report  Nausea Max Last 24 Hours  Not able to report  Nausea Min Last 24 Hours  Not able to report  Anxiety Max Last 24 Hours  Not able to report  Anxiety Min Last 24 Hours  Not able to  report  Other Max Last 24 Hours  Not able to report  Psychosocial & Spiritual Assessment  Palliative Care Outcomes  Patient/Family meeting held?  Yes  Who was at the meeting?  wife  Palliative Care Outcomes  Clarified goals of care, Changed CPR status  Patient/Family wishes: Interventions discontinued/not started   Mechanical Ventilation  Palliative Care follow-up planned  Yes, Facility      Patient Active Problem List   Diagnosis Date Noted  . Protein-calorie malnutrition, severe 02/26/2017  . Goals of care, counseling/discussion   . Hematochezia 02/23/2017  . Prostate CA ([place]) 02/23/2017  . Acidemia   . Palliative care encounter   . Hyperkalemia 12/08/2016  . Flu-like symptoms 12/08/2016  . Sepsis due to urinary tract infection ([place]) 10/01/2016  . Generalized weakness   . Acute lower UTI 09/01/2016  . Bladder cancer ([place]) 09/01/2016  . Neuropathic pain   . Contact dermatitis   . AKI (acute kidney injury) ([place])   . Recurrent UTI   . Leukocytosis   . Absence of bladder continence   . Pain   . Hydrocele in adult   . Acute cystitis with hematuria   . Acute blood loss anemia   . Normocytic anemia   . Other secondary hypertension   . Acute idiopathic gout of left knee   . Urinary retention   . Debilitated 08/02/2016  . Bilateral hydronephrosis   . Normochromic normocytic anemia   . E-coli UTI   . Benign essential HTN   . Coronary artery disease involving coronary bypass graft of native heart without angina pectoris   . BPH (benign prostatic hyperplasia)   . Kidney stones   . CKD (chronic kidney disease), stage II   .  Morbid obesity due to excess calories ([place])   . Acute renal failure (ARF) ([place]) 07/29/2016    Palliative Care Assessment & Plan   Patient Profile: 83 y.o. male  with past medical history of bladder cancer, coronary artery disease with CABG, prostate surgery with penile prothesis (with resultant multiple UTIs), prostate  cancer, hyperlipidemia,  hypertension, admitted on 02/22/2017 after rapid decline over the past 1-2 weeks as evidenced by no oral intake, no urine output, and diarrhea. They also noted bright red blood in his diaper for the past 2 days. He is also been verbalizing pain in his right lower abdomen and groin. Upon arrival to the emergency room he was afebrile tachycardic with stable blood pressure.EKG showed right bundle branch block. Initial potassium was 6.8 on admission and serum creatinine was 17. Large infiltrating bladder mass noted on imaging. Mass with resultant bladder outlet obstruction, with subsequent placement of bilateral nephrostomy tubes. Creatinine improved   Assessment: I met with Mr. Berkley, his wife, and youngest daughter yesterday to reinforce explanations on his health course and options. After our discussion, Mr. Londo was supportive of pursuing comfort measures at [facility]. His goal is to have good pain control while enjoying some time with his family.   Today, Mr. Paff was very surprised he was still alive. We again discussed signs of decline, but I reinforced that I would expect him to have some [limited] time to enjoy his family. He did not want to discuss specific time frames, but expressed relief that he was not expected to die in the next 24 hours. He is eager to leave the hospital and looking forward to "restful night" and "family time."  Recommendations/Plan:  Plan for transfer to [facility] once bed available  Pain medication adjusted:   Schedule tylenol 664m q6H  Continue Fentanyl 552m q2H PRN  Continue Tramadol 5046m12H PRN  Goals of Care and Additional Recommendations:  Limitations on Scope of Treatment: Continue supportive care, transtion to residential hospice on discharge.  Code Status:    Code Status Orders        Start     Ordered   02/23/17 1452  Do not attempt resuscitation (DNR)  Continuous    Question Answer Comment  In the event of cardiac or  respiratory ARREST Do not call a "code blue"   In the event of cardiac or respiratory ARREST Do not perform Intubation, CPR, defibrillation or ACLS   In the event of cardiac or respiratory ARREST Use medication by any route, position, wound care, and other measures to relive pain and suffering. May use oxygen, suction and manual treatment of airway obstruction as needed for comfort.      02/23/17 1451    Code Status History    Date Active Date Inactive Code Status Order ID Comments User Context   02/23/2017  4:29 AM 02/23/2017  2:50 PM Full Code [id number] BullsD ED   12/08/2016 11:08 PM 12/11/2016  3:48 PM Full Code [id number] BullsD ED   10/01/2016  7:43 PM 10/03/2016  9:42 PM Full Code [id number] BullsD ED   09/01/2016 11:26 PM 09/04/2016  3:09 PM Full Code [id number] MilanD Inpatient   08/02/2016  9:32 PM 08/02/2016  9:32 PM Full Code [id number] [id number]-C Inpatient   08/02/2016  9:32 PM 08/16/2016  2:36 PM Full Code [id number] ParsonsA-C Inpatient   07/29/2016  2:42 AM 08/02/2016  9:16 PM Full Code [id number]  Dixie Dials, MD ED       Prognosis:  Less than 4 weeks in the setting of infiltrative bladder mass invading sigmoid colon. High risk for GI bleed due to invading mass. Pt's intake is poor with progressive FTT. High risk for acute decompensation.   Discharge Planning:  Hospice facility  Care plan was discussed with pt  Thank you for allowing the Palliative Medicine Team to assist in the care of this patient.  Total time: 35 minutes    Greater than 50%  of this time was spent counseling and coordinating care related to the above assessment and plan.  Jannette Fogo, NP  Please contact Palliative Medicine Team phone at [phone] for questions and concerns.

## 2017-03-10 DEATH — deceased

## 2017-04-12 ENCOUNTER — Ambulatory Visit: Payer: Medicare Other | Admitting: Podiatry

## 2017-12-04 NOTE — Progress Notes (Signed)
Pt is deceased, Anderson [facility] has been advised.

## 2017-12-26 IMAGING — RF DG SWALLOWING FUNCTION
15 series · 24 of 24 positions shown · non-contrast
Comparison: None.

CLINICAL DATA: Feels like food is sticking.

EXAM:
MODIFIED BARIUM SWALLOW
TECHNIQUE: Different consistencies of barium were administered orally to the
patient by the Speech Pathologist. Imaging of the pharynx was
performed in the lateral projection.
FLUOROSCOPY TIME:  Fluoroscopy Time:  2.1 minutes
Radiation Exposure Index (if provided by the fluoroscopic device):
8.1 mGyair kerma
Number of Acquired Spot Images: 0

[Series 1: cp_standard · 0.34mm/px · 2 of 43 frames shown (1 of 15)]
[frame 7/43]
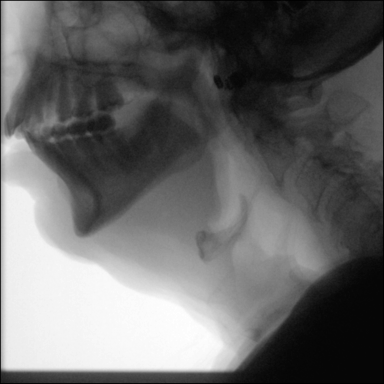
[frame 37/43]
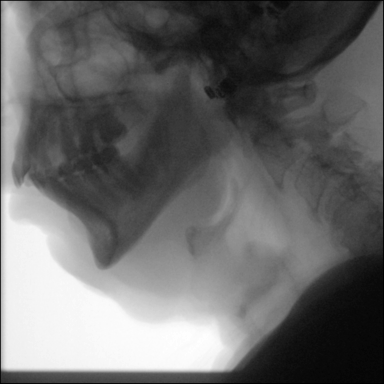

[Series 2: cp_standard · 0.34mm/px · 1 of 203 frames shown (2 of 15)]
[frame 94/203]
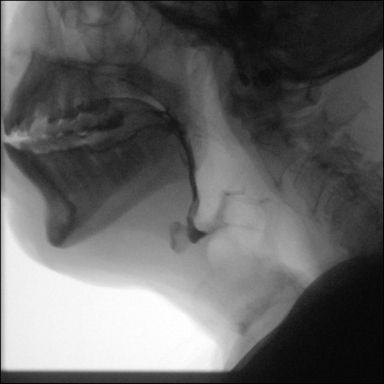

[Series 3: cp_standard · 0.34mm/px · 2 of 5 frames shown (3 of 15)]
[frame 1/5]
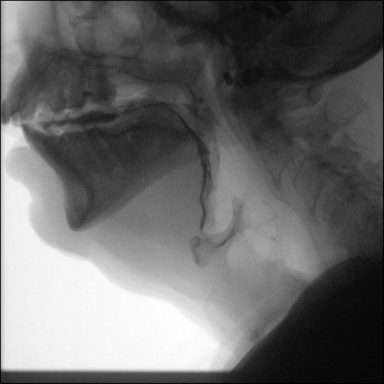
[frame 4/5]
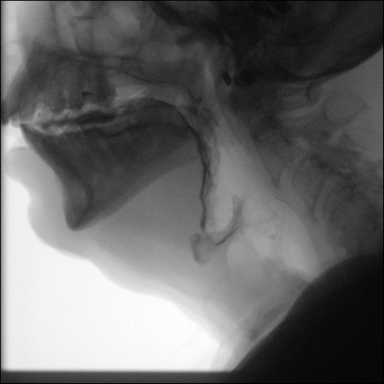

[Series 4: cp_standard · 0.34mm/px · 2 of 33 frames shown (4 of 15)]
[frame 5/33]
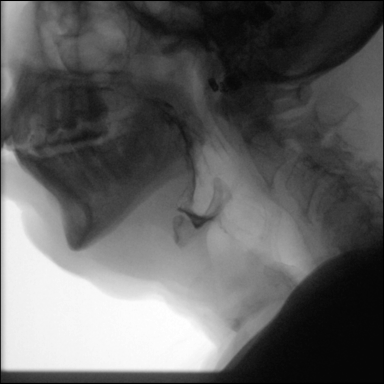
[frame 29/33]
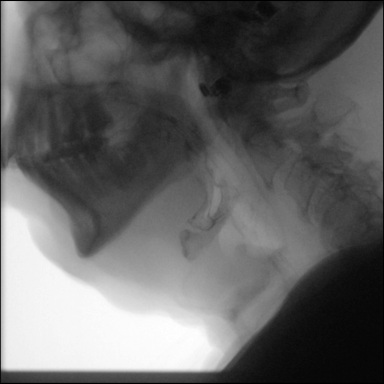

[Series 5: cp_standard · 0.34mm/px · 1 of 193 frames shown (5 of 15)]
[frame 101/193]
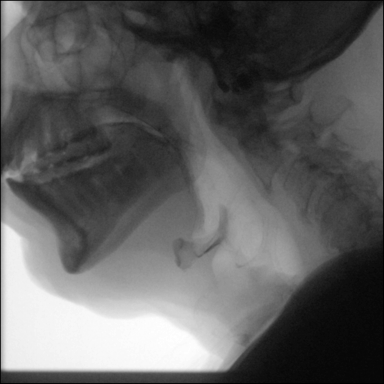

[Series 6: cp_standard · 0.34mm/px · 2 of 38 frames shown (6 of 15)]
[frame 20/38]
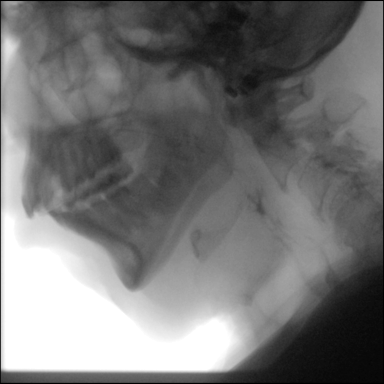
[frame 38/38]
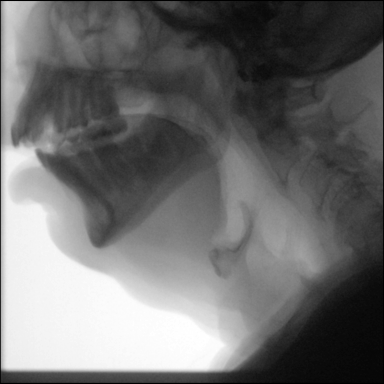

[Series 7: cp_standard · 0.34mm/px · 1 of 280 frames shown (7 of 15)]
[frame 141/280]
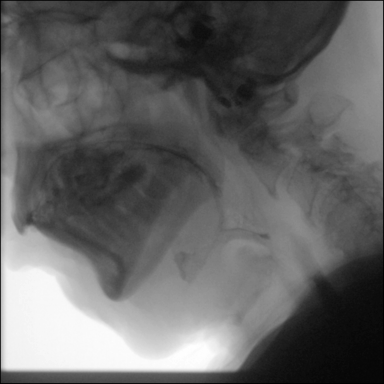

[Series 8: cp_standard · 0.34mm/px · 2 of 115 frames shown (8 of 15)]
[frame 18/115]
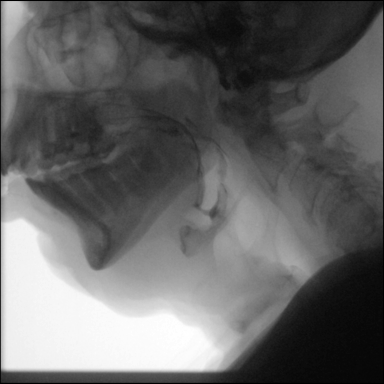
[frame 110/115]
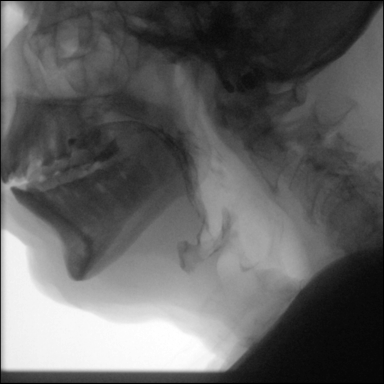

[Series 9: cp_standard · 0.34mm/px · 1 of 46 frames shown (9 of 15)]
[frame 24/46]
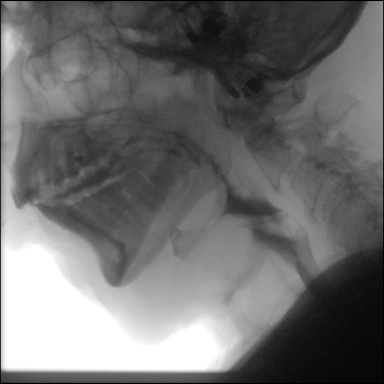

[Series 10: cp_standard · 0.34mm/px · 2 of 157 frames shown (10 of 15)]
[frame 5/157]
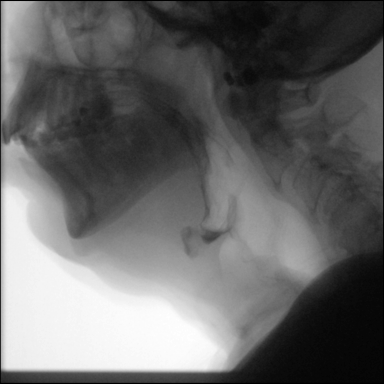
[frame 79/157]
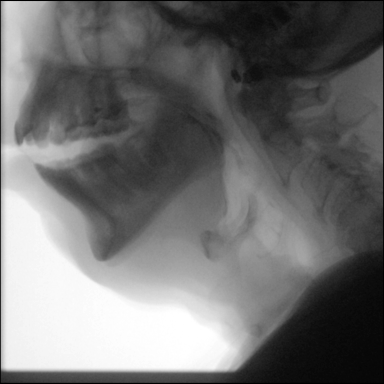

[Series 11: cp_standard · 0.34mm/px · 1 of 142 frames shown (11 of 15)]
[frame 72/142]
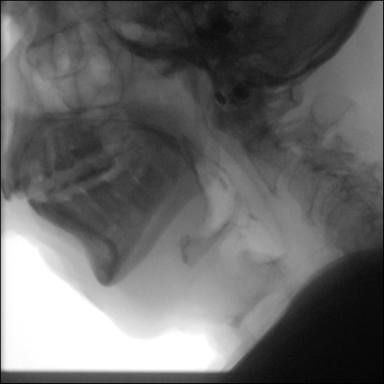

[Series 12: cp_standard · 0.34mm/px · 2 of 17 frames shown (12 of 15)]
[frame 3/17]
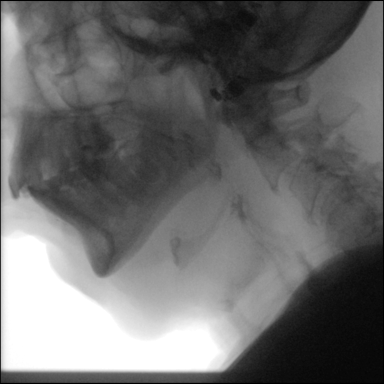
[frame 15/17]
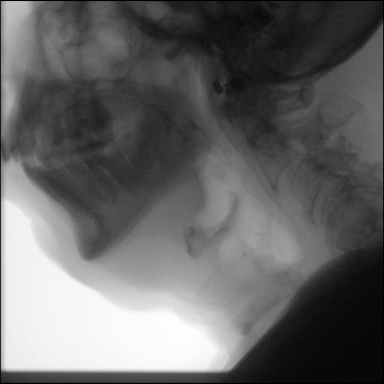

[Series 13: cp_standard · 0.34mm/px · 2 of 170 frames shown (13 of 15)]
[frame 72/170]
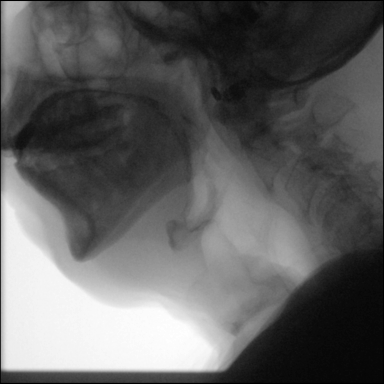
[frame 145/170]
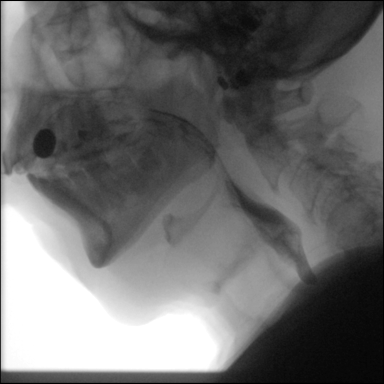

[Series 14: cp_standard · 0.34mm/px · 1 of 193 frames shown (14 of 15)]
[frame 97/193]
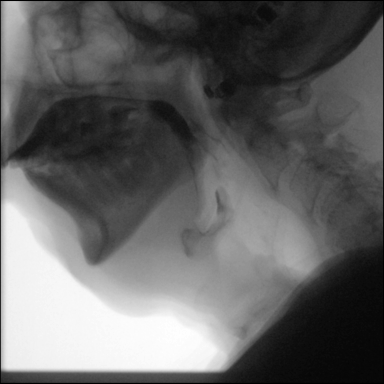

[Series 15: cp_standard · 0.34mm/px · 2 of 12 frames shown (15 of 15)]
[frame 2/12]
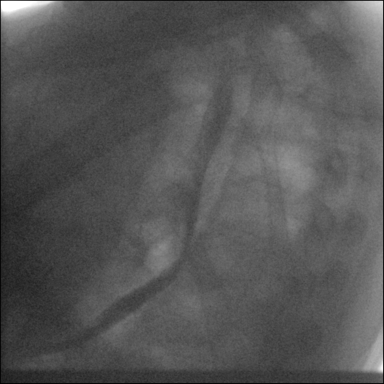
[frame 11/12]
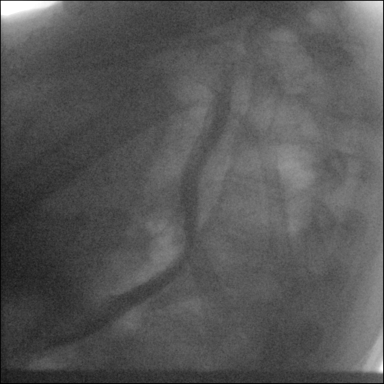

[24 of 24 positions shown; findings below may reference images not displayed]

FINDINGS: Swallowing was observed with consistencies ranging from thin to
solid. There is persistent delay in AP transit with intermittent
tremor of the tongue. Once in the pharynx there is good epiglottic
turnover and airway protection. Occasional mild stasis that would
clear with second swallow. No visualized diverticulum.

Pill was provided but the patient could not deliver it to the
pharynx using either pudding or liquid.

After completion of the exam single lateral image of the esophagus
was obtained showing diffuse stasis.
IMPRESSION: 1. Oral dysphagia, please refer to the Speech Pathologists report
for complete details and recommendations.
2. Esophageal stasis which could be from dysmotility or distal
stricture. Esophagram could further evaluate if appropriate in this
patient with bladder malignancy.
